# Patient Record
Sex: Male | Born: 1960 | Race: White | Hispanic: No | Marital: Married | Smoking: Former smoker
Health system: Southern US, Community
[De-identification: ages and names within clinical notes are randomized; demographics above are authoritative.]

## PROBLEM LIST (undated history)

## (undated) DIAGNOSIS — F419 Anxiety disorder, unspecified: Secondary | ICD-10-CM

## (undated) DIAGNOSIS — R635 Abnormal weight gain: Secondary | ICD-10-CM

## (undated) DIAGNOSIS — J449 Chronic obstructive pulmonary disease, unspecified: Secondary | ICD-10-CM

## (undated) DIAGNOSIS — E785 Hyperlipidemia, unspecified: Secondary | ICD-10-CM

## (undated) DIAGNOSIS — F32A Depression, unspecified: Secondary | ICD-10-CM

## (undated) DIAGNOSIS — F329 Major depressive disorder, single episode, unspecified: Secondary | ICD-10-CM

## (undated) HISTORY — DX: Anxiety disorder, unspecified: F41.9

## (undated) HISTORY — DX: Major depressive disorder, single episode, unspecified: F32.9

## (undated) HISTORY — DX: Chronic obstructive pulmonary disease, unspecified: J44.9

## (undated) HISTORY — DX: Abnormal weight gain: R63.5

## (undated) HISTORY — DX: Depression, unspecified: F32.A

## (undated) HISTORY — DX: Hyperlipidemia, unspecified: E78.5

## (undated) HISTORY — PX: KNEE ARTHROSCOPY: SUR90

---

## 1998-01-20 ENCOUNTER — Emergency Department (HOSPITAL_COMMUNITY): Admission: EM | Admit: 1998-01-20 | Discharge: 1998-01-20 | Payer: Self-pay | Admitting: Internal Medicine

## 2002-07-06 ENCOUNTER — Emergency Department (HOSPITAL_COMMUNITY): Admission: EM | Admit: 2002-07-06 | Discharge: 2002-07-06 | Payer: Self-pay | Admitting: Emergency Medicine

## 2002-07-06 ENCOUNTER — Encounter: Payer: Self-pay | Admitting: Emergency Medicine

## 2002-07-11 ENCOUNTER — Emergency Department (HOSPITAL_COMMUNITY): Admission: EM | Admit: 2002-07-11 | Discharge: 2002-07-11 | Payer: Self-pay | Admitting: *Deleted

## 2002-07-11 ENCOUNTER — Encounter: Payer: Self-pay | Admitting: *Deleted

## 2002-08-22 ENCOUNTER — Ambulatory Visit (HOSPITAL_COMMUNITY): Admission: RE | Admit: 2002-08-22 | Discharge: 2002-08-22 | Payer: Self-pay | Admitting: Family Medicine

## 2002-08-22 ENCOUNTER — Encounter: Payer: Self-pay | Admitting: Family Medicine

## 2003-04-04 ENCOUNTER — Encounter: Payer: Self-pay | Admitting: Family Medicine

## 2003-04-04 ENCOUNTER — Ambulatory Visit (HOSPITAL_COMMUNITY): Admission: RE | Admit: 2003-04-04 | Discharge: 2003-04-04 | Payer: Self-pay | Admitting: Family Medicine

## 2003-05-07 ENCOUNTER — Encounter (INDEPENDENT_AMBULATORY_CARE_PROVIDER_SITE_OTHER): Payer: Self-pay | Admitting: Specialist

## 2003-05-07 ENCOUNTER — Ambulatory Visit (HOSPITAL_COMMUNITY): Admission: RE | Admit: 2003-05-07 | Discharge: 2003-05-07 | Payer: Self-pay | Admitting: Otolaryngology

## 2003-05-07 ENCOUNTER — Ambulatory Visit (HOSPITAL_BASED_OUTPATIENT_CLINIC_OR_DEPARTMENT_OTHER): Admission: RE | Admit: 2003-05-07 | Discharge: 2003-05-07 | Payer: Self-pay | Admitting: Otolaryngology

## 2003-05-13 ENCOUNTER — Encounter: Admission: RE | Admit: 2003-05-13 | Discharge: 2003-05-13 | Payer: Self-pay | Admitting: Otolaryngology

## 2003-05-13 ENCOUNTER — Encounter: Payer: Self-pay | Admitting: Otolaryngology

## 2003-05-13 ENCOUNTER — Ambulatory Visit: Admission: RE | Admit: 2003-05-13 | Discharge: 2003-08-01 | Payer: Self-pay | Admitting: *Deleted

## 2003-05-20 ENCOUNTER — Encounter: Admission: RE | Admit: 2003-05-20 | Discharge: 2003-05-20 | Payer: Self-pay | Admitting: Dentistry

## 2003-05-30 ENCOUNTER — Ambulatory Visit (HOSPITAL_COMMUNITY): Admission: RE | Admit: 2003-05-30 | Discharge: 2003-05-30 | Payer: Self-pay | Admitting: *Deleted

## 2003-06-14 ENCOUNTER — Ambulatory Visit (HOSPITAL_COMMUNITY): Admission: RE | Admit: 2003-06-14 | Discharge: 2003-06-14 | Payer: Self-pay | Admitting: Oncology

## 2003-07-04 ENCOUNTER — Inpatient Hospital Stay (HOSPITAL_COMMUNITY): Admission: EM | Admit: 2003-07-04 | Discharge: 2003-07-12 | Payer: Self-pay | Admitting: Oncology

## 2003-07-14 ENCOUNTER — Inpatient Hospital Stay (HOSPITAL_COMMUNITY): Admission: EM | Admit: 2003-07-14 | Discharge: 2003-07-31 | Payer: Self-pay | Admitting: Emergency Medicine

## 2003-08-08 ENCOUNTER — Ambulatory Visit: Admission: RE | Admit: 2003-08-08 | Discharge: 2003-08-08 | Payer: Self-pay | Admitting: *Deleted

## 2003-08-30 ENCOUNTER — Ambulatory Visit: Admission: RE | Admit: 2003-08-30 | Discharge: 2003-08-30 | Payer: Self-pay | Admitting: *Deleted

## 2003-09-10 ENCOUNTER — Ambulatory Visit (HOSPITAL_COMMUNITY): Admission: RE | Admit: 2003-09-10 | Discharge: 2003-09-10 | Payer: Self-pay | Admitting: Oncology

## 2003-09-13 ENCOUNTER — Ambulatory Visit: Admission: RE | Admit: 2003-09-13 | Discharge: 2003-09-13 | Payer: Self-pay | Admitting: *Deleted

## 2003-10-21 ENCOUNTER — Ambulatory Visit: Admission: RE | Admit: 2003-10-21 | Discharge: 2003-11-05 | Payer: Self-pay | Admitting: *Deleted

## 2003-11-04 ENCOUNTER — Ambulatory Visit (HOSPITAL_COMMUNITY): Admission: RE | Admit: 2003-11-04 | Discharge: 2003-11-04 | Payer: Self-pay | Admitting: Otolaryngology

## 2003-11-04 ENCOUNTER — Encounter (INDEPENDENT_AMBULATORY_CARE_PROVIDER_SITE_OTHER): Payer: Self-pay | Admitting: Specialist

## 2003-11-04 ENCOUNTER — Ambulatory Visit (HOSPITAL_BASED_OUTPATIENT_CLINIC_OR_DEPARTMENT_OTHER): Admission: RE | Admit: 2003-11-04 | Discharge: 2003-11-04 | Payer: Self-pay | Admitting: Otolaryngology

## 2003-11-06 ENCOUNTER — Encounter: Admission: RE | Admit: 2003-11-06 | Discharge: 2003-11-06 | Payer: Self-pay | Admitting: Otolaryngology

## 2003-11-13 ENCOUNTER — Inpatient Hospital Stay (HOSPITAL_COMMUNITY): Admission: RE | Admit: 2003-11-13 | Discharge: 2003-11-17 | Payer: Self-pay | Admitting: Otolaryngology

## 2003-11-13 ENCOUNTER — Encounter (INDEPENDENT_AMBULATORY_CARE_PROVIDER_SITE_OTHER): Payer: Self-pay | Admitting: Specialist

## 2003-11-13 HISTORY — PX: LARYNGECTOMY: SUR815

## 2003-11-17 ENCOUNTER — Inpatient Hospital Stay (HOSPITAL_COMMUNITY): Admission: EM | Admit: 2003-11-17 | Discharge: 2003-11-19 | Payer: Self-pay | Admitting: *Deleted

## 2003-11-28 ENCOUNTER — Ambulatory Visit (HOSPITAL_COMMUNITY): Admission: RE | Admit: 2003-11-28 | Discharge: 2003-11-28 | Payer: Self-pay | Admitting: Oncology

## 2004-03-04 ENCOUNTER — Ambulatory Visit (HOSPITAL_COMMUNITY): Admission: RE | Admit: 2004-03-04 | Discharge: 2004-03-04 | Payer: Self-pay | Admitting: Oncology

## 2004-03-18 ENCOUNTER — Ambulatory Visit: Payer: Self-pay | Admitting: Dentistry

## 2004-03-21 ENCOUNTER — Ambulatory Visit (HOSPITAL_COMMUNITY): Admission: RE | Admit: 2004-03-21 | Discharge: 2004-03-21 | Payer: Self-pay | Admitting: Family Medicine

## 2004-03-26 ENCOUNTER — Ambulatory Visit: Payer: Self-pay | Admitting: Dentistry

## 2004-03-30 ENCOUNTER — Ambulatory Visit: Payer: Self-pay | Admitting: Dentistry

## 2004-04-07 ENCOUNTER — Ambulatory Visit: Payer: Self-pay | Admitting: Dentistry

## 2004-04-28 ENCOUNTER — Ambulatory Visit: Payer: Self-pay | Admitting: Dentistry

## 2004-06-02 ENCOUNTER — Ambulatory Visit: Payer: Self-pay | Admitting: Dentistry

## 2004-06-30 ENCOUNTER — Ambulatory Visit (HOSPITAL_COMMUNITY): Admission: RE | Admit: 2004-06-30 | Discharge: 2004-06-30 | Payer: Self-pay | Admitting: Oncology

## 2004-07-10 ENCOUNTER — Ambulatory Visit: Payer: Self-pay | Admitting: Oncology

## 2004-07-15 ENCOUNTER — Ambulatory Visit: Admission: RE | Admit: 2004-07-15 | Discharge: 2004-07-15 | Payer: Self-pay | Admitting: Oncology

## 2004-07-17 ENCOUNTER — Encounter: Admission: RE | Admit: 2004-07-17 | Discharge: 2004-07-17 | Payer: Self-pay | Admitting: Oncology

## 2004-08-21 ENCOUNTER — Encounter (INDEPENDENT_AMBULATORY_CARE_PROVIDER_SITE_OTHER): Payer: Self-pay | Admitting: *Deleted

## 2004-08-21 ENCOUNTER — Ambulatory Visit (HOSPITAL_COMMUNITY): Admission: RE | Admit: 2004-08-21 | Discharge: 2004-08-21 | Payer: Self-pay | Admitting: Otolaryngology

## 2004-08-21 ENCOUNTER — Ambulatory Visit (HOSPITAL_BASED_OUTPATIENT_CLINIC_OR_DEPARTMENT_OTHER): Admission: RE | Admit: 2004-08-21 | Discharge: 2004-08-21 | Payer: Self-pay | Admitting: Otolaryngology

## 2004-09-01 ENCOUNTER — Ambulatory Visit: Payer: Self-pay | Admitting: Dentistry

## 2004-09-01 ENCOUNTER — Encounter: Admission: EM | Admit: 2004-09-01 | Discharge: 2004-09-01 | Payer: Self-pay | Admitting: Dentistry

## 2004-10-14 ENCOUNTER — Ambulatory Visit: Payer: Self-pay | Admitting: Dentistry

## 2004-11-06 ENCOUNTER — Ambulatory Visit (HOSPITAL_BASED_OUTPATIENT_CLINIC_OR_DEPARTMENT_OTHER): Admission: RE | Admit: 2004-11-06 | Discharge: 2004-11-06 | Payer: Self-pay | Admitting: Otolaryngology

## 2004-11-16 ENCOUNTER — Ambulatory Visit (HOSPITAL_COMMUNITY): Admission: RE | Admit: 2004-11-16 | Discharge: 2004-11-16 | Payer: Self-pay | Admitting: Oncology

## 2004-11-20 ENCOUNTER — Ambulatory Visit: Payer: Self-pay | Admitting: Oncology

## 2004-11-25 ENCOUNTER — Ambulatory Visit: Payer: Self-pay | Admitting: Dentistry

## 2004-12-09 ENCOUNTER — Ambulatory Visit: Payer: Self-pay | Admitting: Dentistry

## 2005-01-12 ENCOUNTER — Ambulatory Visit: Payer: Self-pay | Admitting: Dentistry

## 2005-03-16 ENCOUNTER — Ambulatory Visit: Payer: Self-pay | Admitting: Oncology

## 2005-03-17 ENCOUNTER — Ambulatory Visit (HOSPITAL_COMMUNITY): Admission: RE | Admit: 2005-03-17 | Discharge: 2005-03-17 | Payer: Self-pay | Admitting: Oncology

## 2005-05-19 ENCOUNTER — Ambulatory Visit (HOSPITAL_COMMUNITY): Admission: RE | Admit: 2005-05-19 | Discharge: 2005-05-19 | Payer: Self-pay | Admitting: Oncology

## 2005-07-27 ENCOUNTER — Ambulatory Visit: Payer: Self-pay | Admitting: Dentistry

## 2005-07-30 ENCOUNTER — Ambulatory Visit: Payer: Self-pay | Admitting: Oncology

## 2005-11-15 ENCOUNTER — Ambulatory Visit (HOSPITAL_COMMUNITY): Admission: RE | Admit: 2005-11-15 | Discharge: 2005-11-15 | Payer: Self-pay | Admitting: Oncology

## 2005-11-18 ENCOUNTER — Ambulatory Visit: Payer: Self-pay | Admitting: Oncology

## 2005-11-23 LAB — CBC WITH DIFFERENTIAL/PLATELET
EOS%: 3.3 % (ref 0.0–7.0)
MCH: 30.7 pg (ref 28.0–33.4)
MCV: 89 fL (ref 81.6–98.0)
MONO%: 9.2 % (ref 0.0–13.0)
RBC: 4.8 10*6/uL (ref 4.20–5.71)
RDW: 13.9 % (ref 11.2–14.6)

## 2005-11-23 LAB — COMPREHENSIVE METABOLIC PANEL
AST: 41 U/L — ABNORMAL HIGH (ref 0–37)
Albumin: 4.1 g/dL (ref 3.5–5.2)
Alkaline Phosphatase: 45 U/L (ref 39–117)
BUN: 7 mg/dL (ref 6–23)
Potassium: 4.4 mEq/L (ref 3.5–5.3)
Sodium: 139 mEq/L (ref 135–145)
Total Bilirubin: 0.5 mg/dL (ref 0.3–1.2)
Total Protein: 7.1 g/dL (ref 6.0–8.3)

## 2006-02-09 ENCOUNTER — Ambulatory Visit: Payer: Self-pay | Admitting: Dentistry

## 2006-05-17 ENCOUNTER — Encounter (INDEPENDENT_AMBULATORY_CARE_PROVIDER_SITE_OTHER): Payer: Self-pay | Admitting: *Deleted

## 2006-05-17 ENCOUNTER — Ambulatory Visit (HOSPITAL_BASED_OUTPATIENT_CLINIC_OR_DEPARTMENT_OTHER): Admission: RE | Admit: 2006-05-17 | Discharge: 2006-05-17 | Payer: Self-pay | Admitting: Otolaryngology

## 2006-05-24 ENCOUNTER — Ambulatory Visit: Payer: Self-pay | Admitting: Oncology

## 2006-05-26 LAB — COMPREHENSIVE METABOLIC PANEL
ALT: 45 U/L (ref 0–53)
AST: 27 U/L (ref 0–37)
Albumin: 4.1 g/dL (ref 3.5–5.2)
Calcium: 9.7 mg/dL (ref 8.4–10.5)
Chloride: 106 mEq/L (ref 96–112)
Potassium: 3.8 mEq/L (ref 3.5–5.3)
Total Protein: 6.9 g/dL (ref 6.0–8.3)

## 2006-05-26 LAB — CBC WITH DIFFERENTIAL/PLATELET
BASO%: 0.7 % (ref 0.0–2.0)
Basophils Absolute: 0 10*3/uL (ref 0.0–0.1)
EOS%: 2.8 % (ref 0.0–7.0)
HGB: 14.6 g/dL (ref 13.0–17.1)
MCH: 31.3 pg (ref 28.0–33.4)
MCHC: 34.7 g/dL (ref 32.0–35.9)
RDW: 14.3 % (ref 11.2–14.6)
lymph#: 1.3 10*3/uL (ref 0.9–3.3)

## 2006-05-30 ENCOUNTER — Ambulatory Visit (HOSPITAL_COMMUNITY): Admission: RE | Admit: 2006-05-30 | Discharge: 2006-05-30 | Payer: Self-pay | Admitting: Oncology

## 2006-08-24 ENCOUNTER — Ambulatory Visit: Payer: Self-pay | Admitting: Dentistry

## 2006-11-02 IMAGING — CT CT ABDOMEN W/ CM
1 of 4 series · 12 of 32 positions shown, 16 images · IV contrast (omnipaque)
Comparison: CT chest, abdomen, and pelvis of 03/04/04.
COMPARISON: PET-CT 03/04/2004 and 09/10/2003.

<!--  IDXRADR:ADDEND:BEGIN -->Addendum Begins<!--  IDXRADR:ADDEND:INNER_BEGIN -->This addendum is being made to add the reports for the CT chest, abdomen, and pelvis of the same date, which were linked but not included in the report previously printed.
CLINICAL DATA: History of laryngeal cancer originally diagnosed in 0994 for which the patient underwent laryngectomy, chemotherapy, and radiation therapy.  Routine follow-up CT.  He complains of a two-week history of right lower chest pain and right upper quadrant abdominal pain. 
 CT OF THE CHEST, ABDOMEN, AND PELVIS WITH CONTRAST 06/30/04:
TECHNIQUE: Multidetector helical CT through the chest, abdomen, and pelvis was performed during bolus intravenous complete administration.  Oral contrast was given.  Delayed imaging through the kidneys was performed.  
 Contrast:  150 cc of Omnipaque 300.
TECHNIQUE: 17.1 mCi F-18 FDG was injected intravenously via the right
antecubital vein .  Full-ring PET imaging was performed from the skull base
through the mid-thighs 50 minutes after injection.  CT data was obtained and
used for attenuation correction and anatomic localization only.  (This was not
acquired as a diagnostic CT examination.)

[Series 2: cap w/iv 5.0 b30f · axial · 0.84mm/px · z∈[+1222,+1862]mm · 12 of 144 slices shown, 16 images]
[im 8/144  soft-tissue]
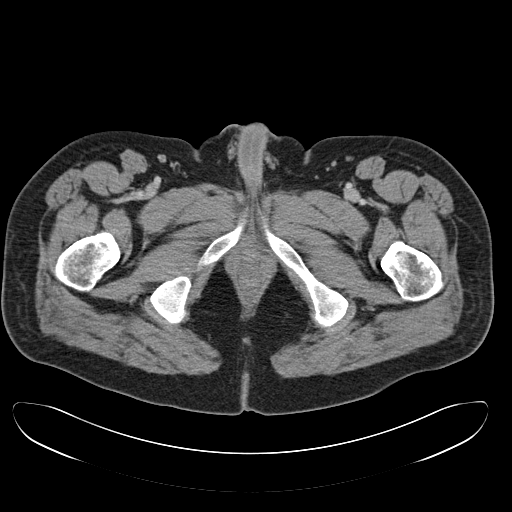
[im 8/144  bone]
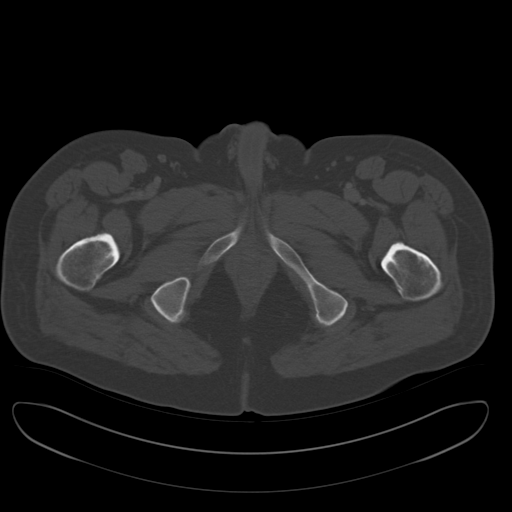
[im 23/144  soft-tissue]
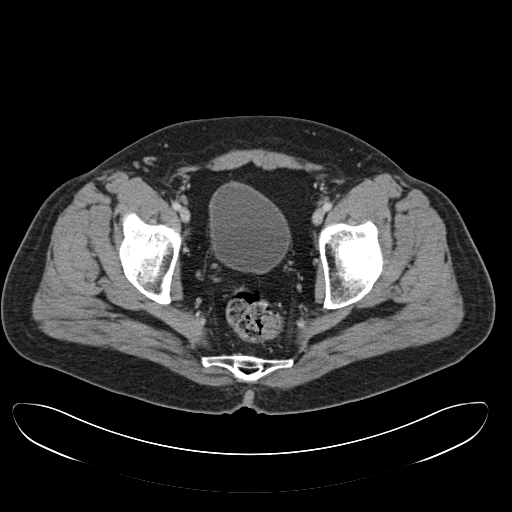
[im 38/144  soft-tissue]
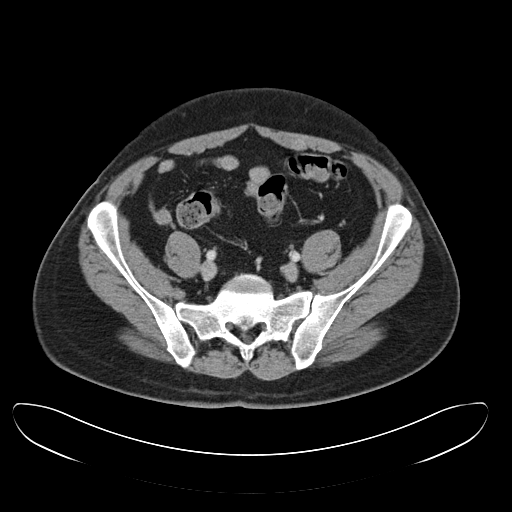
[im 53/144  soft-tissue]
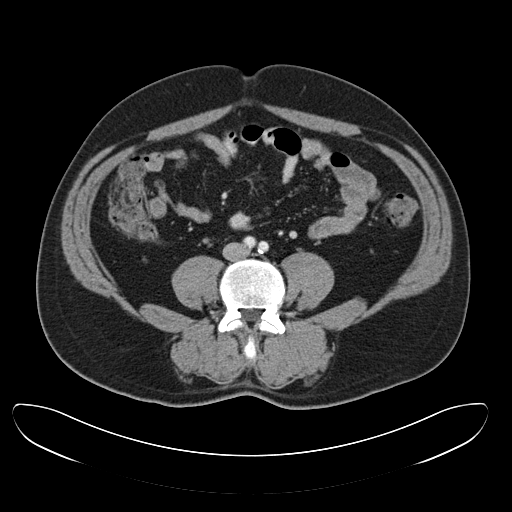
[im 68/144  soft-tissue]
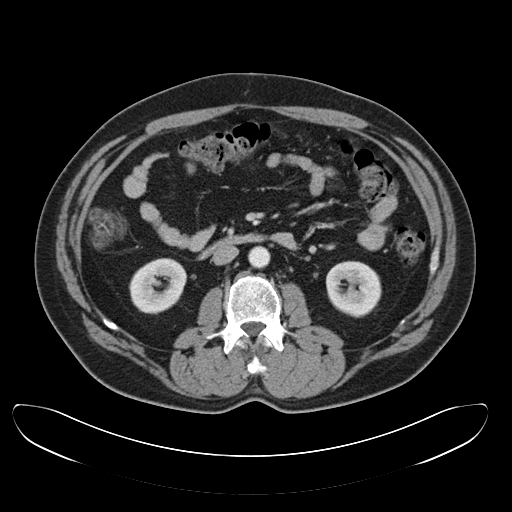
[im 76/144  soft-tissue]
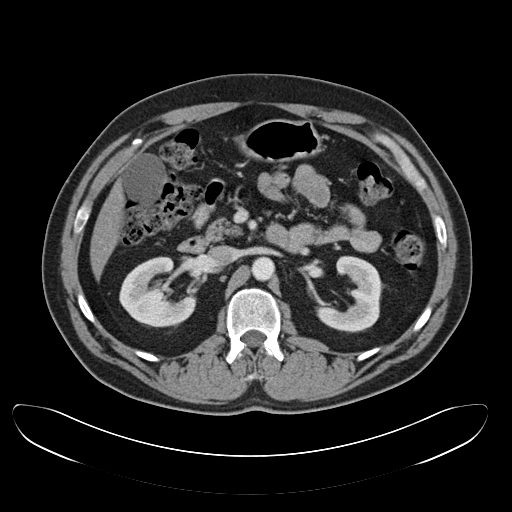
[im 91/144  soft-tissue]
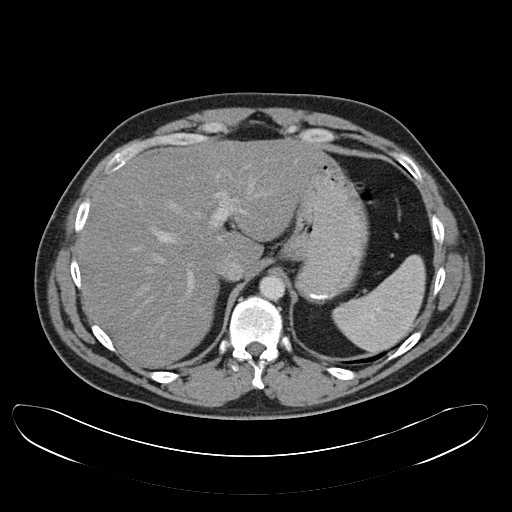
[im 106/144  soft-tissue]
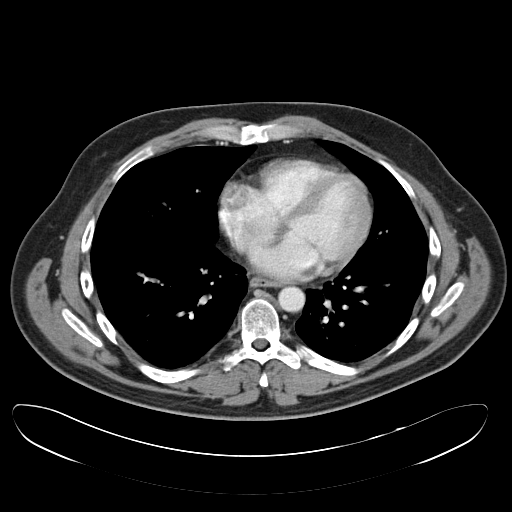
[im 113/144  lung]
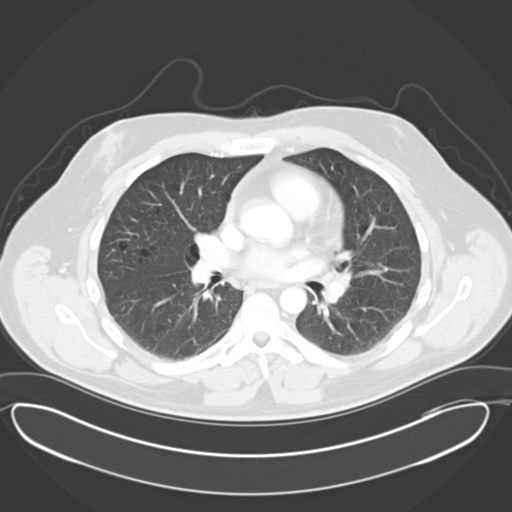
[im 121/144  soft-tissue]
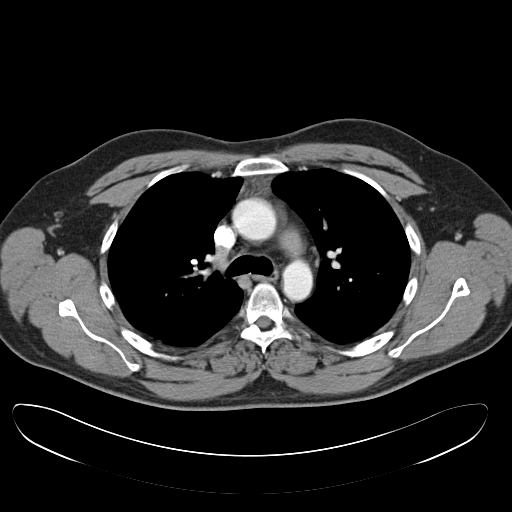
[im 121/144  lung]
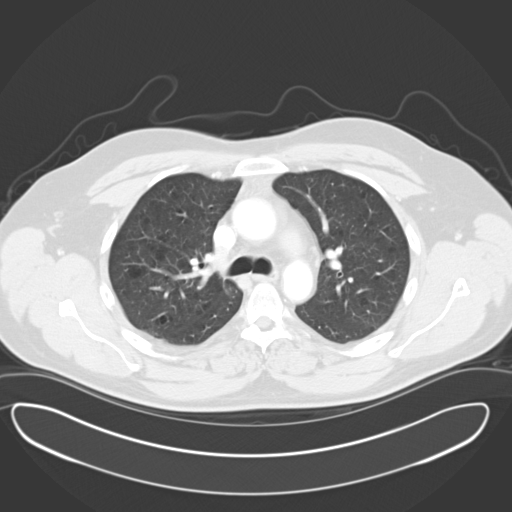
[im 121/144  bone]
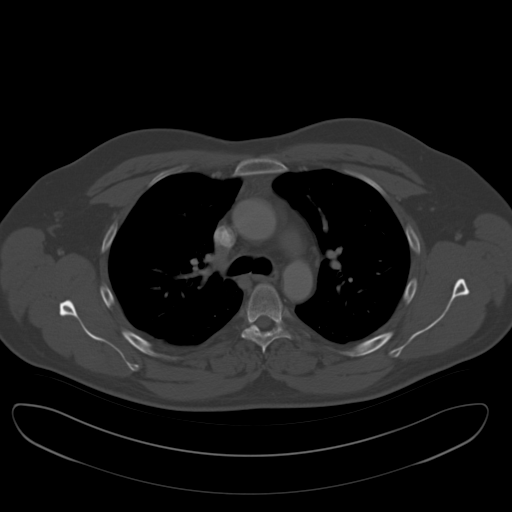
[im 128/144  lung]
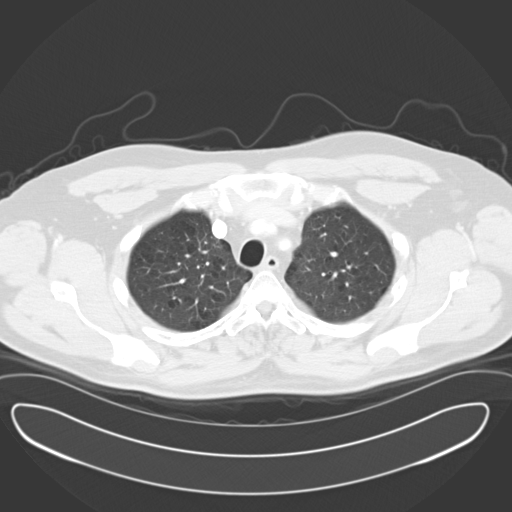
[im 136/144  soft-tissue]
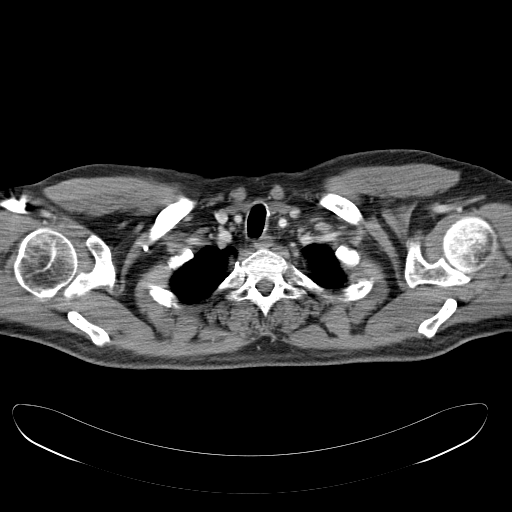
[im 136/144  lung]
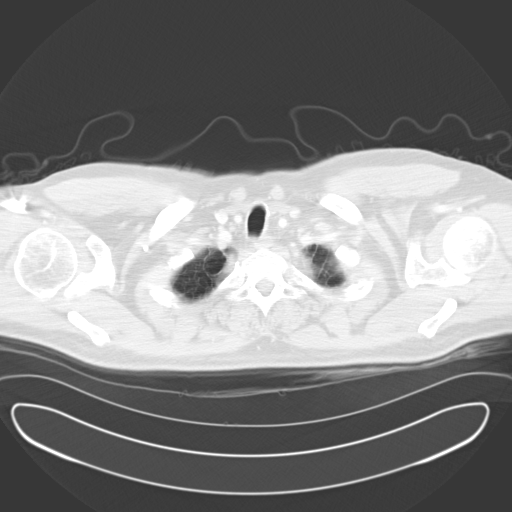

[12 of 32 positions shown; findings below may reference images not displayed]

CT CHEST:
 Postsurgical changes related to the previous tracheostomy is noted.  No significant mediastinal, hilar, or axillary lymphadenopathy is identified.  Visualized supraclavicular regions are unremarkable.  The heart size is normal and is stable.  There is no pericardial effusion.  There are no pleural effusions.  
 Emphysematous changes are again noted throughout both lungs.  The subpleural nodule adjacent to the diaphragm deep in the right lower lobe identified on multiple previous CTs is again noted and is unchanged; it is, therefore, likely a benign lesion such as a subpleural lymph node.  No new or enlarging pulmonary nodules are identified.  The lungs appear clear otherwise.
IMPRESSION: 1.  No evidence of metastatic disease in the chest. 
 2.  Emphysema.  No acute cardiopulmonary disease and no significant change since 03/04/04.
 CT ABDOMEN:
 Since the prior examination, the patient has developed mild diffuse fatty infiltration of the liver; no focal liver lesions are identified to suggest metastatic disease.  The spleen and pancreas remain normal.  The gallbladder is unremarkable by CT, and there is no biliary ductal dilation.  The previously identified left adrenal nodule is again noted and is unchanged, consistent with an adenoma.  There may be a small adenoma in the right adrenal gland at its crus, also unchanged.  Both kidneys remain normal.  Mild distal abdominal aortic atherosclerosis is present.  There is no significant lymphadenopathy in the abdomen.  There is no free fluid.  The stomach and the visualized colon and small bowel are unremarkable in the upper abdomen.  Note is made of a focus of accessory splenic tissue adjacent to the splenic hilus.
IMPRESSION: 1.  Mild diffuse fatty infiltration of the liver, a new finding since 03/04/04.  
 [DATE].  No evidence of metastatic disease in the abdomen. 
 3.  Stable small bilateral adrenal adenomas. 
 CT PELVIS:
 The colon and the small bowel are unremarkable in the pelvis.  The prostate gland is normal in size.  Seminal vesicles are normal.  The urinary bladder is normal.  There is no significant lymphadenopathy.  There is no free fluid.  Mild ileofemoral atherosclerosis is present.
IMPRESSION: 1.  No evidence of metastatic disease to the pelvis. 
 2.  No acute abnormalities in the pelvis and no significant change since 03/04/04.

 <!--  IDXRADR:ADDEND:INNER_END -->Addendum Ends
<!--  IDXRADR:ADDEND:END -->Clinical Data:  History of head and neck cancer (laryngeal) for which the
patient underwent laryngectomy. Patient also underwent radiation therapy and
chemotherapy. Routine followup. 

FDG PET-CT TUMOR IMAGING (SKULL BASE TO THIGHS)  06/30/2004:

Fasting Blood Glucose:  118
The diagnostic CT chest evident
pelvis performed subsequently today is also correlated.
FINDINGS: The postsurgical and post radiation activity identified in the neck
on the previous examination has resolved in the interval. No abnormal metabolic
activity is identified within the neck to suggest residual or recurrent
laryngeal cancer. There is asymmetric mild increased activity throughout the
left parotid gland which is nonspecific and is unchanged. The there is no
abnormal metabolic activity in the chest, abdomen or pelvis to suggest
metastatic disease.

Physiologic activity in the left ventricle, physiologic activity in luminal
contents of large and small bowel, and physiologic excretion into the urinary
tract is noted. There is an artifactual focus of increased activity which
overlies the left femoral neck on the MIP image, but is shown on the axial
images to be outside the body.
IMPRESSION: 1. No evidence of recurrent or metastatic disease.

2. Artifactual focus of activity which overlies the left femoral neck on the MIP
image but is actually outside the body.

## 2007-03-21 ENCOUNTER — Ambulatory Visit: Payer: Self-pay | Admitting: Dentistry

## 2007-05-29 ENCOUNTER — Ambulatory Visit (HOSPITAL_COMMUNITY): Admission: RE | Admit: 2007-05-29 | Discharge: 2007-05-29 | Payer: Self-pay | Admitting: Oncology

## 2007-05-31 ENCOUNTER — Ambulatory Visit: Payer: Self-pay | Admitting: Oncology

## 2007-08-09 ENCOUNTER — Ambulatory Visit: Payer: Self-pay | Admitting: Oncology

## 2007-08-11 LAB — CBC WITH DIFFERENTIAL/PLATELET
Basophils Absolute: 0 10*3/uL (ref 0.0–0.1)
Eosinophils Absolute: 0.2 10*3/uL (ref 0.0–0.5)
HCT: 39.6 % (ref 38.7–49.9)
LYMPH%: 31.1 % (ref 14.0–48.0)
MCV: 89.6 fL (ref 81.6–98.0)
MONO%: 7.7 % (ref 0.0–13.0)
NEUT#: 2.9 10*3/uL (ref 1.5–6.5)
NEUT%: 56.5 % (ref 40.0–75.0)
Platelets: 223 10*3/uL (ref 145–400)
RBC: 4.42 10*6/uL (ref 4.20–5.71)

## 2007-08-11 LAB — COMPREHENSIVE METABOLIC PANEL
Alkaline Phosphatase: 37 U/L — ABNORMAL LOW (ref 39–117)
BUN: 11 mg/dL (ref 6–23)
CO2: 23 mEq/L (ref 19–32)
Creatinine, Ser: 1.19 mg/dL (ref 0.40–1.50)
Glucose, Bld: 167 mg/dL — ABNORMAL HIGH (ref 70–99)
Sodium: 140 mEq/L (ref 135–145)
Total Bilirubin: 0.4 mg/dL (ref 0.3–1.2)

## 2007-08-11 LAB — LACTATE DEHYDROGENASE: LDH: 147 U/L (ref 94–250)

## 2007-10-03 ENCOUNTER — Ambulatory Visit: Payer: Self-pay | Admitting: Dentistry

## 2008-10-08 ENCOUNTER — Ambulatory Visit: Payer: Self-pay | Admitting: Dentistry

## 2009-10-17 ENCOUNTER — Inpatient Hospital Stay (HOSPITAL_COMMUNITY): Admission: AD | Admit: 2009-10-17 | Discharge: 2009-10-19 | Payer: Self-pay | Admitting: Internal Medicine

## 2009-11-04 ENCOUNTER — Ambulatory Visit: Payer: Self-pay | Admitting: Dentistry

## 2010-01-02 ENCOUNTER — Emergency Department (HOSPITAL_COMMUNITY): Admission: EM | Admit: 2010-01-02 | Discharge: 2010-01-02 | Payer: Self-pay | Admitting: Emergency Medicine

## 2010-01-26 ENCOUNTER — Ambulatory Visit: Payer: Self-pay | Admitting: Internal Medicine

## 2010-01-26 DIAGNOSIS — R519 Headache, unspecified: Secondary | ICD-10-CM | POA: Insufficient documentation

## 2010-01-26 DIAGNOSIS — R0989 Other specified symptoms and signs involving the circulatory and respiratory systems: Secondary | ICD-10-CM

## 2010-01-26 DIAGNOSIS — R51 Headache: Secondary | ICD-10-CM | POA: Insufficient documentation

## 2010-01-26 DIAGNOSIS — R0609 Other forms of dyspnea: Secondary | ICD-10-CM | POA: Insufficient documentation

## 2010-01-26 DIAGNOSIS — C140 Malignant neoplasm of pharynx, unspecified: Secondary | ICD-10-CM | POA: Insufficient documentation

## 2010-01-26 DIAGNOSIS — G4730 Sleep apnea, unspecified: Secondary | ICD-10-CM | POA: Insufficient documentation

## 2010-01-26 DIAGNOSIS — E039 Hypothyroidism, unspecified: Secondary | ICD-10-CM | POA: Insufficient documentation

## 2010-01-26 DIAGNOSIS — E785 Hyperlipidemia, unspecified: Secondary | ICD-10-CM | POA: Insufficient documentation

## 2010-01-26 LAB — CONVERTED CEMR LAB
GFR calc non Af Amer: 75.5 mL/min (ref >60)
Pro B Natriuretic peptide (BNP): 5.6 pg/mL (ref 0.0–100.0)
Sed Rate: 48 mm/hr — ABNORMAL HIGH (ref 0–22)

## 2010-02-27 ENCOUNTER — Ambulatory Visit: Payer: Self-pay | Admitting: Internal Medicine

## 2010-04-08 ENCOUNTER — Ambulatory Visit: Payer: Self-pay | Admitting: Internal Medicine

## 2010-04-08 DIAGNOSIS — M069 Rheumatoid arthritis, unspecified: Secondary | ICD-10-CM | POA: Insufficient documentation

## 2010-07-06 ENCOUNTER — Ambulatory Visit: Payer: Self-pay | Admitting: Internal Medicine

## 2010-08-09 ENCOUNTER — Encounter: Payer: Self-pay | Admitting: Oncology

## 2010-08-09 ENCOUNTER — Encounter: Payer: Self-pay | Admitting: Family Medicine

## 2010-08-10 ENCOUNTER — Telehealth (INDEPENDENT_AMBULATORY_CARE_PROVIDER_SITE_OTHER): Payer: Self-pay | Admitting: *Deleted

## 2010-08-13 ENCOUNTER — Encounter: Payer: Self-pay | Admitting: Internal Medicine

## 2010-08-18 NOTE — Assessment & Plan Note (Signed)
Summary: Pulmonary/ f/u ov better on symbicort 160   Copy to:  Dr. Elana Alm Primary Provider/Referring Provider:  Dr. Elana Alm  CC:  4 wk followup.  Pt states that his breathing has improved alot since started symbicort.  He still has some chest congestion but states that wymbicort has helped to break this up so that he can cough it up- was dark green but now light green x 2 wks.  He was recently dxed with having RA and sees Dr Sharmon Revere.Joseph [name]  History of Present Illness: 5 yowm quit smoking 2005 @ laryngectomy no trouble breathing prior to surgery but sob ever since and progressively getting worse.  January 26, 2010 cc doe x > slow walking even at grocery store some better with use of duoneb assoc with with minimal mucoid sputum and sign wt gain since underwent tracheotomy. rec duoneb 4x daily   February 27, 2010 4 wk followup.  Breathing some improved.  He gets out of breath when goes out in the heat.  No new complaints today.using duonebs and lot of albuterol which helps alot. no purulent sputum.Work on inhaler technique:  relax and blow all the way out then take a nice smooth deep breath back in, triggering the inhaler at same time you start breathing in  Symbicort 160 .2 puffs first thing  in am and 2 puffs again in pm about 12 hours later   April 08, 2010 4 wk followup.  Pt states that his breathing has improved alot since started symbicort.  He still has some chest congestion but states that symbicort has helped to break this up so that he can cough it up- was dark green but now light green x 2 wks.  He was recently dxed with having RA and sees Dr Sharmon Revere.  Pt denies any significant sore throat, dysphagia, itching, sneezing,  nasal congestion or excess secretions,  fever, chills, sweats, unintended wt loss, pleuritic or exertional cp, hempoptysis, change in activity tolerance  orthopnea pnd or leg swelling      Current Medications (verified): 1)  Symbicort 160-4.5 Mcg/act  Aero  (Budesonide-Formoterol Fumarate) .... 2 Puffs First Thing  in Am and 2 Puffs Again in Pm About 12 Hours Later 2)  Voltaren 0.1 % Soln (Diclofenac Sodium) .... As Directed 3)  Percocet 7.5-325 Mg Tabs (Oxycodone-Acetaminophen) .Joseph [name].. 1 Every 6 Hours As Needed 4)  Trazodone Hcl 100 Mg Tabs (Trazodone Hcl) .Joseph [name].. 1 At Bedtime 5)  Kadian 20 Mg Xr24h-Cap (Morphine Sulfate) .Joseph [name].. 1 Two Times A Day 6)  Sertraline Hcl 50 Mg Tabs (Sertraline Hcl) .Joseph [name].. 1 Once Daily 7)  Maxalt 10 Mg Tabs (Rizatriptan Benzoate) .... As Directed As Needed 8)  Novolog 100 Unit/ml Soln (Insulin Aspart) .... As Directed 9)  Novolog Mix 70/30 70-30 % Susp (Insulin Aspart Prot & Aspart) .... As Directed 10)  Cyclobenzaprine Hcl 10 Mg Tabs (Cyclobenzaprine Hcl) .Joseph [name].. 1 Three Times A Day 11)  Fenofibrate 54 Mg Tabs (Fenofibrate) .Joseph [name].. 1 Once Daily 12)  Crestor 10 Mg Tabs (Rosuvastatin Calcium) .Joseph [name].. 1 Once Daily 13)  Lansoprazole 15 Mg Cpdr (Lansoprazole) .Joseph [name].. 1 Once Daily 14)  Levothroid 137 Mcg Tabs (Levothyroxine Sodium) .Joseph [name].. 1 Once Daily 15)  Amitriptyline Hcl 150 Mg Tabs (Amitriptyline Hcl) .Joseph [name].. 1 At Bedtime 16)  Seroquel 400 Mg Tabs (Quetiapine Fumarate) .Joseph [name].. 1 Once Daily 17)  Alprazolam 1 Mg Tabs (Alprazolam) .Joseph [name].. 1 Four Times A Day As Needed 18)  Ipratropium-Albuterol 0.5-2.5 (3) Mg/38ml Soln (Ipratropium-Albuterol) .Joseph [name].. 1 in Nebulizer Four Times A Day  19)  Proair Hfa 108 (90 Base) Mcg/act Aers (Albuterol Sulfate) .... 2 Puffs Every 4-6 Hours As Needed 20)  Methotrexate 2.5 Mg Tabs (Methotrexate Sodium) .... 6 Tabs Once Per Wk 21)  Folic Acid 1 Mg Tabs (Folic Acid) .Joseph [name].. 1 Once Daily 22)  Prednisone 5 Mg Tabs (Prednisone) .Joseph [name].. 1 1/2 Once Daily  Allergies (verified): No Known Drug Allergies  Past History:  Past Medical History: Diabetes Hyperlipidemia Weight gain after quit smoking in 2005 COPD/ AB  clinical dx     - HFA 75% February 28, 2010 < 90% April 08, 2010   Vital Signs:  Patient profile:   50 year old  male Weight:      248 pounds O2 Sat:      96 % on Room air Temp:     98.3 degrees F oral Pulse rate:   114 / minute BP sitting:   110 / 70  (left arm)  Vitals Entered By: Vernie Murders (April 08, 2010 1:32 PM)  O2 Flow:  Room air  Physical Exam  Additional Exam:  somber  amb obese wm talking thru a voice box  January 26, 2010 > 248 February 28, 2010 > 248 April 08, 2010  HEENT mild turbinate edema.  Oropharynx no thrush or excess pnd or cobblestoning.  No JVD or cervical adenopathy. Mild accessory muscle hypertrophy. Trachea midline, nl thryroid. Chest was hyperinflated by percussion with diminished breath sounds and moderate increased exp time without wheeze. Hoover sign positive at mid inspiration. Regular rate and rhythm without murmur gallop or rub or increase P2 or edema.  Abd: massively obese with  no hsm, nl excursion. Ext warm without cyanosis or clubbing.     Clinical Reports Reviewed:  CXR:  01/02/2010: CXR Results:  mild bibasial atx   Impression & Recommendations:  Problem # 1:  DYSPNEA (ICD-786.09)  His updated medication list for this problem includes:    Symbicort 160-4.5 Mcg/act Aero (Budesonide-formoterol fumarate) .Joseph [name]... 2 puffs first thing  in am and 2 puffs again in pm about 12 hours later    Proair Hfa 108 (90 Base) Mcg/act Aers (Albuterol sulfate) .Joseph [name]... 2 puffs every 4-6 hours as needed    Ipratropium-albuterol 0.5-2.5 (3) Mg/60ml Soln (Ipratropium-albuterol) .Joseph [name]... 1 in nebulizer four times a day  Marked improvement suggests more AB than emphysema and ok in this setting to change the other inhalers to as needed  Each maintenance medication was reviewed in detail including most importantly the difference between maintenance prns and under what circumstances the prns are to be used.  In addition, these two groups (for which the patient should keep up with refills) were distinguished from a third group :  meds that are used only short term with the intent to  complete a course of therapy and then not refill them.  The med list was then fully reconciled and reorganized to reflect this important distinction.   Orders: Est. Patient Level III (09811) Prescription Created Electronically [phone])  Medications Added to Medication List This Visit: 1)  Symbicort 160-4.5 Mcg/act Aero (Budesonide-formoterol fumarate) .... 2 puffs first thing  in am and 2 puffs again in pm about 12 hours later 2)  Methotrexate 2.5 Mg Tabs (Methotrexate sodium) .... 6 tabs once per wk 3)  Folic Acid 1 Mg Tabs (Folic acid) .Joseph [name].. 1 once daily 4)  Prednisone 5 Mg Tabs (Prednisone) .Joseph [name].. 1 1/2 once daily  Patient Instructions: 1)   Think of your medications in 3 separate categories and keep them  separate:  2)  a) The ones you take no matter what daily on a scheduled basis 3)  b) The ones you only take if needed for specific problems 4)  c)  The ones you take for a short course and stop, like antibiotics and prednisone. 5)  Symbiocort 160 = dulera 200 6)  Return to office in 3 months, sooner if needed  7)    Prescriptions: SYMBICORT 160-4.5 MCG/ACT  AERO (BUDESONIDE-FORMOTEROL FUMARATE) 2 puffs first thing  in am and 2 puffs again in pm about 12 hours later  #1 x 11   Entered and Authorized by:   Nyoka Cowden MD   Signed by:   Nyoka Cowden MD on 04/08/2010   Method used:   Electronically to        [facility] [phone]* (retail)       [address]       [place], [place]  [address]       Ph: [phone] or [phone]       Fax: [phone]   RxID:   [id number]

## 2010-08-18 NOTE — Assessment & Plan Note (Signed)
Summary: Pulmonary/ new pt eval    Visit Type:  Initial Consult Copy to:  Dr. Elana Alm Primary Provider/Referring Provider:  Dr. Elana Alm  CC:  Dyspnea.  History of Present Illness: 50 yowm quit smoking 2005 @ laryngectomy no trouble breathing prior to surgery but sob ever since and progressively getting worse.  January 26, 2010 cc doe x > slow walking even at grocery store some better with use of duoneb assoc with with minimal mucoid sputum and sign wt gain since underwent tracheotomy. Pt denies any significant sore throat, dysphagia, itching, sneezing,  nasal congestion or excess secretions,  fever, chills, sweats, unintended wt loss,classic or reproducible  pleuritic or exertional cp, hempoptysis, change in activity tolerance  orthopnea pnd or leg swelling.  Pt also denies any obvious fluctuation in symptoms with weather or environmental change or other alleviating or aggravating factors.       Current Medications (verified): 1)  Voltaren 0.1 % Soln (Diclofenac Sodium) .... As Directed 2)  Percocet 7.5-325 Mg Tabs (Oxycodone-Acetaminophen) .Marland Kitchen.. 1 Every 6 Hours As Needed 3)  Trazodone Hcl 100 Mg Tabs (Trazodone Hcl) .Marland Kitchen.. 1 At Bedtime 4)  Kadian 20 Mg Xr24h-Cap (Morphine Sulfate) .Marland Kitchen.. 1 Two Times A Day 5)  Sertraline Hcl 50 Mg Tabs (Sertraline Hcl) .Marland Kitchen.. 1 Once Daily 6)  Maxalt 10 Mg Tabs (Rizatriptan Benzoate) .... As Directed As Needed 7)  Ipratropium-Albuterol 0.5-2.5 (3) Mg/50ml Soln (Ipratropium-Albuterol) .Marland Kitchen.. 1 in Nebulizer 2 To 3 Times Per Day As Needed 8)  Proair Hfa 108 (90 Base) Mcg/act Aers (Albuterol Sulfate) .... 2 Puffs Every 4-6 Hours As Needed 9)  Novolog 100 Unit/ml Soln (Insulin Aspart) .... As Directed 10)  Novolog Mix 70/30 70-30 % Susp (Insulin Aspart Prot & Aspart) .... As Directed 11)  Cyclobenzaprine Hcl 10 Mg Tabs (Cyclobenzaprine Hcl) .Marland Kitchen.. 1 Three Times A Day 12)  Fenofibrate 54 Mg Tabs (Fenofibrate) .Marland Kitchen.. 1 Once Daily 13)  Crestor 10 Mg Tabs (Rosuvastatin Calcium)  .Marland Kitchen.. 1 Once Daily 14)  Lansoprazole 15 Mg Cpdr (Lansoprazole) .Marland Kitchen.. 1 Once Daily 15)  Levothroid 137 Mcg Tabs (Levothyroxine Sodium) .Marland Kitchen.. 1 Once Daily 16)  Amitriptyline Hcl 150 Mg Tabs (Amitriptyline Hcl) .Marland Kitchen.. 1 At Bedtime 17)  Seroquel 400 Mg Tabs (Quetiapine Fumarate) .Marland Kitchen.. 1 Once Daily 18)  Alprazolam 1 Mg Tabs (Alprazolam) .Marland Kitchen.. 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  Quit in 2005.  Smoked approx 30 yrs up to 3 ppd. No ETOH  Review of Systems       The patient complains of shortness of breath with activity, shortness of breath at rest, productive cough, chest pain, acid heartburn, indigestion, loss of appetite, weight change, abdominal pain, difficulty swallowing, sore throat, headaches, nasal congestion/difficulty breathing through nose, itching, anxiety, depression, hand/feet swelling, change in color of mucus, and fever.  The patient denies non-productive cough, coughing up blood, irregular heartbeats, tooth/dental problems, sneezing, ear ache, joint stiffness or pain, and rash.    Vital Signs:  Patient profile:   50 year old male Height:      73 inches Weight:      248.25 pounds BMI:     32.87 O2 Sat:  96 % on Room air Temp:     98.4 degrees F oral Pulse rate:   121 / minute BP sitting:   98 / 74  (left arm)  Vitals Entered By: Vernie Murders (January 26, 2010 12:00 PM)  O2 Flow:  Room air  Serial Vital Signs/Assessments:  Comments: Ambulatory Pulse Oximetry  Resting; HR__123___    02 Sat_91%RA____  Lap1 (185 feet)   HR__127___   02 Sat__92%RA___ Lap2 (185 feet)   HR_133____   02  Sat__92%RA___    Lap3 (185 feet)   HR_136____   02 Sat__93%RA___  __X_Test Completed without Difficulty ___Test Stopped due to: Carver Fila  January 26, 2010 12:34 PM    By: Carver Fila    Physical Exam  Additional Exam:  depressed amb obese wm talking thru a voice box wt 245 January 26, 2010  HEENT mild turbinate edema.  Oropharynx no thrush or excess pnd or cobblestoning.  No JVD or cervical adenopathy. Mild accessory muscle hypertrophy. Trachea midline, nl thryroid. Chest was hyperinflated by percussion with diminished breath sounds and moderate increased exp time without wheeze. Hoover sign positive at mid inspiration. Regular rate and rhythm without murmur gallop or rub or increase P2 or edema.  Abd: massively obese with  no hsm, nl excursion. Ext warm without cyanosis or clubbing.       Sodium                    141 mEq/L                   135-145   Potassium                 5.1 mEq/L                   3.5-5.1   Chloride                  105 mEq/L                   96-112   Carbon Dioxide            29 mEq/L                    19-32   Glucose              [H]  145 mg/dL                   16-10   BUN                       15 mg/dL                    9-60   Creatinine                1.1 mg/dL                   4.5-4.0   Calcium                   9.9 mg/dL                   9.8-11.9   GFR                       75.50 mL/min                >  60  Tests: (2) TSH (TSH)   FastTSH              [H]  8.75 uIU/mL                 0.35-5.50  Tests: (3) B-Type Natiuretic Peptide (BNPR)  B-Type Natriuetic Peptide                             5.6 pg/mL                   0.0-100.0  Tests: (4) Sed Rate (ESR)   Sed Rate             [H]  48 mm/hr                    0-22  CXR  Procedure date:  01/02/2010  Findings:      mild bibasial atx  Impression & Recommendations:  Problem # 1:  DYSPNEA (ICD-786.09)  His updated medication list for this problem includes:    Ipratropium-albuterol 0.5-2.5  (3) Mg/6ml Soln (Ipratropium-albuterol) .Marland Kitchen... 1 in nebulizer 2 to 3 times per day as needed    Proair Hfa 108 (90 Base) Mcg/act Aers (Albuterol sulfate) .Marland Kitchen... 2 puffs every 4-6 hours as needed  When respiratory symptoms begin   after a patient reports complete smoking cessation,  it is very hard to "blame" COPD  ie it doesn't make any more sense than hearing a  NASCAR driver wrecked his car while driving his kids to school or a Careers adviser sliced his hand off carving [place].  Once the high risk activity stops,  the symptoms should not suddenly erupt.  If so, the differential diagnosis should include  obesity/deconditioning,  LPR/Reflux, CHF, or side effect of medications.  In this case chronicity of his complaints strongly suggests obesity with deconditionig is probably the biggest issue. ASked him to consider rehab but he doesn't think his chronic pain will allow this.  Problem # 2:  HYPOTHYROIDISM (ICD-244.9)  TSH 16.1 > 8.75 on rx  His updated medication list for this problem includes:    Levothroid 137 Mcg Tabs (Levothyroxine sodium) .Marland Kitchen... 1 once daily  Orders: New Patient Level V ([address]109)  Medications Added to Medication List This Visit: 1)  Voltaren 0.1 % Soln (Diclofenac sodium) .... As directed 2)  Percocet 7.5-325 Mg Tabs (Oxycodone-acetaminophen) .Marland Kitchen.. 1 every 6 hours as needed 3)  Trazodone Hcl 100 Mg Tabs (Trazodone hcl) .Marland Kitchen.. 1 at bedtime 4)  Kadian 20 Mg Xr24h-cap (Morphine sulfate) .Marland Kitchen.. 1 two times a day 5)  Sertraline Hcl 50 Mg Tabs (Sertraline hcl) .Marland Kitchen.. 1 once daily 6)  Maxalt 10 Mg Tabs (Rizatriptan benzoate) .... As directed as needed 7)  Ipratropium-albuterol 0.5-2.5 (3) Mg/25ml Soln (Ipratropium-albuterol) .Marland Kitchen.. 1 in nebulizer 2 to 3 times per day as needed 8)  Proair Hfa 108 (90 Base) Mcg/act Aers (Albuterol sulfate) .... 2 puffs every 4-6 hours as needed 9)  Novolog 100 Unit/ml Soln (Insulin aspart) .... As directed 10)  Novolog Mix 70/30 70-30 % Susp (Insulin aspart prot &  aspart) .... As directed 11)  Cyclobenzaprine Hcl 10 Mg Tabs (Cyclobenzaprine hcl) .Marland Kitchen.. 1 three times a day 12)  Fenofibrate 54 Mg Tabs (Fenofibrate) .Marland Kitchen.. 1 once daily 13)  Crestor 10 Mg Tabs (Rosuvastatin calcium) .Marland Kitchen.. 1 once daily 14)  Lansoprazole 15 Mg Cpdr (Lansoprazole) .Marland Kitchen.. 1 once daily 15)  Levothroid 137 Mcg Tabs (Levothyroxine sodium) .Marland KitchenMarland KitchenMarland Kitchen  1 once daily 16)  Amitriptyline Hcl 150 Mg Tabs (Amitriptyline hcl) .Marland Kitchen.. 1 at bedtime 17)  Seroquel 400 Mg Tabs (Quetiapine fumarate) .Marland Kitchen.. 1 once daily 18)  Alprazolam 1 Mg Tabs (Alprazolam) .Marland Kitchen.. 1 four times a day as needed  Other Orders: Pulse Oximetry, Ambulatory (98119) TLB-BMP (Basic Metabolic Panel-BMET) (80048-METABOL) TLB-TSH (Thyroid Stimulating Hormone) (84443-TSH) TLB-BNP (B-Natriuretic Peptide) (83880-BNPR) TLB-Sedimentation Rate (ESR) (85652-ESR)  Patient Instructions: 1)  use duoneb up to 4 x daily 2)  bring your other inhlalers with you to next visit 3)  Please schedule a follow-up appointment in 4  weeks, sooner if needed

## 2010-08-18 NOTE — Assessment & Plan Note (Signed)
Summary: Pulmonary/ ext ov with hfa teaching 75%, try Symbicort 160   Copy to:  Dr. Elana Alm Primary Provider/Referring Provider:  Dr. Elana Alm  CC:  4 wk followup.  Breathing some improved.  He gets out of breath when goes out in the heat.  No new complaints today.Joseph [name]  History of Present Illness: 50 yowm quit smoking 2005 @ laryngectomy no trouble breathing prior to surgery but sob ever since and progressively getting worse.  January 26, 2010 cc doe x > slow walking even at grocery store some better with use of duoneb assoc with with minimal mucoid sputum and sign wt gain since underwent tracheotomy. rec duoneb 4x daily   February 27, 2010 4 wk followup.  Breathing some improved.  He gets out of breath when goes out in the heat.  No new complaints today.using duonebs and lot of albuterol which helps alot. no purulent sputum. Pt denies any significant sore throat, dysphagia, itching, sneezing,  nasal congestion or excess secretions,  fever, chills, sweats, unintended wt loss, pleuritic or exertional cp, hempoptysis, change in activity tolerance  orthopnea pnd or leg swelling   Current Medications (verified): 1)  Voltaren 0.1 % Soln (Diclofenac Sodium) .... As Directed 2)  Percocet 7.5-325 Mg Tabs (Oxycodone-Acetaminophen) .Joseph [name].. 1 Every 6 Hours As Needed 3)  Trazodone Hcl 100 Mg Tabs (Trazodone Hcl) .Joseph [name].. 1 At Bedtime 4)  Kadian 20 Mg Xr24h-Cap (Morphine Sulfate) .Joseph [name].. 1 Two Times A Day 5)  Sertraline Hcl 50 Mg Tabs (Sertraline Hcl) .Joseph [name].. 1 Once Daily 6)  Maxalt 10 Mg Tabs (Rizatriptan Benzoate) .... As Directed As Needed 7)  Ipratropium-Albuterol 0.5-2.5 (3) Mg/58ml Soln (Ipratropium-Albuterol) .Joseph [name].. 1 in Nebulizer Four Times A Day 8)  Proair Hfa 108 (90 Base) Mcg/act Aers (Albuterol Sulfate) .... 2 Puffs Every 4-6 Hours As Needed 9)  Novolog 100 Unit/ml Soln (Insulin Aspart) .... As Directed 10)  Novolog Mix 70/30 70-30 % Susp (Insulin Aspart Prot & Aspart) .... As Directed 11)  Cyclobenzaprine Hcl 10 Mg  Tabs (Cyclobenzaprine Hcl) .Joseph [name].. 1 Three Times A Day 12)  Fenofibrate 54 Mg Tabs (Fenofibrate) .Joseph [name].. 1 Once Daily 13)  Crestor 10 Mg Tabs (Rosuvastatin Calcium) .Joseph [name].. 1 Once Daily 14)  Lansoprazole 15 Mg Cpdr (Lansoprazole) .Joseph [name].. 1 Once Daily 15)  Levothroid 137 Mcg Tabs (Levothyroxine Sodium) .Joseph [name].. 1 Once Daily 16)  Amitriptyline Hcl 150 Mg Tabs (Amitriptyline Hcl) .Joseph [name].. 1 At Bedtime 17)  Seroquel 400 Mg Tabs (Quetiapine Fumarate) .Joseph [name].. 1 Once Daily 18)  Alprazolam 1 Mg Tabs (Alprazolam) .Joseph [name].. 1 Four Times A Day As Needed  Allergies (verified): No Known Drug Allergies  Past History:  Past Medical History: Diabetes Hyperlipidemia Weight gain after quit smoking in 2005 COPD/ AB  clinical dx     - HFA 75% February 28, 2010   Vital Signs:  Patient profile:   50 year old male Weight:      248 pounds O2 Sat:      95 % on Room air Temp:     97.9 degrees F oral Pulse rate:   92 / minute BP sitting:   118 / 80  (left arm)  Vitals Entered By: Vernie Murders (February 27, 2010 12:20 PM)  O2 Flow:  Room air  Physical Exam  Additional Exam:  somber  amb obese wm talking thru a voice box  January 26, 2010 > 248 February 28, 2010  HEENT mild turbinate edema.  Oropharynx no thrush or excess pnd or cobblestoning.  No JVD or cervical adenopathy. Mild accessory  muscle hypertrophy. Trachea midline, nl thryroid. Chest was hyperinflated by percussion with diminished breath sounds and moderate increased exp time without wheeze. Hoover sign positive at mid inspiration. Regular rate and rhythm without murmur gallop or rub or increase P2 or edema.  Abd: massively obese with  no hsm, nl excursion. Ext warm without cyanosis or clubbing.     Impression & Recommendations:  Problem # 1:  DYSPNEA (ICD-786.09)  His updated medication list for this problem includes:    Symbicort 160-4.5 Mcg/act Aero (Budesonide-formoterol fumarate) .Joseph [name]... 2 puffs first thing  in am and 2 puffs again in pm about 12 hours later     Ipratropium-albuterol 0.5-2.5 (3) Mg/4ml Soln (Ipratropium-albuterol) .Joseph [name]... 1 in nebulizer four times a day    Proair Hfa 108 (90 Base) Mcg/act Aers (Albuterol sulfate) .Joseph [name]... 2 puffs every 4-6 hours as needed  Not able to do spirometry but freq need for saba suggests unaddressed ab component so try symbicort I spent extra time with the patient today explaining optimal mdi  technique.  This improved from  25-75% effective thru trach stoma  Orders: Est. Patient Level IV (61950)  Problem # 2:  HYPOTHYROIDISM (ICD-244.9)  His updated medication list for this problem includes:    Levothroid 137 Mcg Tabs (Levothyroxine sodium) .Joseph [name]... 1 once daily  Labs Reviewed: TSH: 8.75 (01/26/2010)   (before increase) fu Dr Elana Alm  Medications Added to Medication List This Visit: 1)  Symbicort 160-4.5 Mcg/act Aero (Budesonide-formoterol fumarate) .... 2 puffs first thing  in am and 2 puffs again in pm about 12 hours later 2)  Ipratropium-albuterol 0.5-2.5 (3) Mg/57ml Soln (Ipratropium-albuterol) .Joseph [name].. 1 in nebulizer four times a day  Patient Instructions: 1)  Work on inhaler technique:  relax and blow all the way out then take a nice smooth deep breath back in, triggering the inhaler at same time you start breathing in  2)  Symbicort 160 .2 puffs first thing  in am and 2 puffs again in pm about 12 hours later  3)  Please schedule a follow-up appointment in 4 weeks, sooner if needed

## 2010-08-20 NOTE — Progress Notes (Signed)
Summary: letter for disability hearing  Phone Note Call from Patient Call back at Home Phone [phone]   Caller: spouse/lori Call For: dr wert Summary of Call: Patients wife Lawson Fiscal phoned regarding a letter that Mr. Hoying needs from Dr. Sherene Sires for a disability hearing. She can be reached at [phone] Initial call taken by: Vedia Coffer,  August 10, 2010 4:03 PM  Follow-up for Phone Call        Pt wife is calling stating the pt has a disability hearing on Feb 14th and he needs a letter from Dr. Sherene Sires stating that he is treating him for COPD and that his COPD keeps him from working. Please advise. Pt will pick up letter once done.Carron Curie CMA  August 10, 2010 5:04 PM  Done Follow-up by: Nyoka Cowden MD,  August 13, 2010 1:58 PM  Additional Follow-up for Phone Call Additional follow up Details #1::        Letter was placed up front for pick up.  Vernie Murders  August 13, 2010 2:05 PM     Additional Follow-up for Phone Call Additional follow up Details #2::    Letter was placed up front for pick up.  LMOM for pt's spouse to be made aware. Vernie Murders  August 13, 2010 2:09 PM

## 2010-08-20 NOTE — Assessment & Plan Note (Signed)
Summary: Pulmonary/ f/u ov no change in rx   Copy to:  Dr. Elana Alm Primary Provider/Referring Provider:  Dr. Elana Alm  CC:  Dyspnea- worse.  History of Present Illness: 38 yowm quit smoking 2005 @ laryngectomy no trouble breathing prior to surgery but sob ever since and progressively getting worse.  January 26, 2010 cc doe x > slow walking even at grocery store some better with use of duoneb assoc with with minimal mucoid sputum and sign wt gain since underwent tracheotomy. rec duoneb 4x daily   February 27, 2010 4 wk followup.  Breathing some improved.  He gets out of breath when goes out in the heat.  No new complaints today.using duonebs and lot of albuterol which helps alot. no purulent sputum.Work on inhaler technique:  relax and blow all the way out then take a nice smooth deep breath back in, triggering the inhaler at same time you start breathing in  Symbicort 160 .2 puffs first thing  in am and 2 puffs again in pm about 12 hours later   April 08, 2010 4 wk followup.  Pt states that his breathing has improved alot since started symbicort.  He still has some chest congestion but states that symbicort has helped to break this up so that he can cough it up- was dark green but now light green x 2 wks.  He was recently dxed with having RA and sees Dr Sharmon Revere in [place] - rec symbicort or dulera preferes symbicort.   July 06, 2010 ov cc sob worse since cough with green sputum, improving on rx.  Pt denies any significant sore throat, dysphagia, itching, sneezing,  nasal congestion or excess secretions,  fever, chills, sweats, unintended wt loss, pleuritic or exertional cp, hempoptysis,    orthopnea pnd or leg swelling. Pt also denies any obvious fluctuation in symptoms with weather or environmental change or other alleviating or aggravating factors.       Current Medications (verified): 1)  Symbicort 160-4.5 Mcg/act  Aero (Budesonide-Formoterol Fumarate) .... 2 Puffs First Thing  in Am and 2  Puffs Again in Pm About 12 Hours Later 2)  Trazodone Hcl 100 Mg Tabs (Trazodone Hcl) .Marland Kitchen.. 1 At Bedtime 3)  Kadian 20 Mg Xr24h-Cap (Morphine Sulfate) .Marland Kitchen.. 1 Two Times A Day 4)  Sertraline Hcl 50 Mg Tabs (Sertraline Hcl) .Marland Kitchen.. 1 Once Daily 5)  Novolog 100 Unit/ml Soln (Insulin Aspart) .... As Directed 6)  Novolog Mix 70/30 70-30 % Susp (Insulin Aspart Prot & Aspart) .... As Directed 7)  Cyclobenzaprine Hcl 10 Mg Tabs (Cyclobenzaprine Hcl) .Marland Kitchen.. 1 Three Times A Day 8)  Fenofibrate 54 Mg Tabs (Fenofibrate) .Marland Kitchen.. 1 Once Daily 9)  Crestor 10 Mg Tabs (Rosuvastatin Calcium) .Marland Kitchen.. 1 Once Daily 10)  Lansoprazole 15 Mg Cpdr (Lansoprazole) .Marland Kitchen.. 1 Once Daily 11)  Levothroid 137 Mcg Tabs (Levothyroxine Sodium) .Marland Kitchen.. 1 Once Daily 12)  Amitriptyline Hcl 150 Mg Tabs (Amitriptyline Hcl) .Marland Kitchen.. 1 At Bedtime 13)  Seroquel 400 Mg Tabs (Quetiapine Fumarate) .Marland Kitchen.. 1 Once Daily 14)  Percocet 7.5-325 Mg Tabs (Oxycodone-Acetaminophen) .Marland Kitchen.. 1 Every 6 Hours As Needed 15)  Maxalt 10 Mg Tabs (Rizatriptan Benzoate) .... As Directed As Needed 16)  Alprazolam 1 Mg Tabs (Alprazolam) .Marland Kitchen.. 1 Four Times A Day As Needed 17)  Methotrexate 2.5 Mg Tabs (Methotrexate Sodium) .... 6 Tabs Once Per Wk 18)  Folic Acid 1 Mg Tabs (Folic Acid) .Marland Kitchen.. 1 Once Daily 19)  Prednisone 5 Mg Tabs (Prednisone) .Marland Kitchen.. 1 1/2 Once Daily 20)  Voltaren  0.1 % Soln (Diclofenac Sodium) .... As Directed 21)  Proair Hfa 108 (90 Base) Mcg/act Aers (Albuterol Sulfate) .... 2 Puffs Every 4-6 Hours As Needed 22)  Ipratropium-Albuterol 0.5-2.5 (3) Mg/57ml Soln (Ipratropium-Albuterol) .Marland Kitchen.. 1 in Nebulizer Four Times A Day  Allergies (verified): No Known Drug Allergies  Past History:  Past Medical History: Diabetes Hyperlipidemia Weight gain after quit smoking in 2005 COPD/ AB  clinical dx     - HFA 75% February 28, 2010  >   90% April 08, 2010   Vital Signs:  Patient profile:   50 year old male Weight:      259 pounds O2 Sat:      96 % on Room air Temp:      98.4 degrees F oral Pulse rate:   114 / minute BP sitting:   92 / 68  (left arm)  Vitals Entered By: Vernie Murders (July 06, 2010 12:01 PM)  O2 Flow:  Room air  Physical Exam  Additional Exam:  somber  amb obese wm talking thru a voice box  January 26, 2010 > 248 February 28, 2010 > 248 April 08, 2010 > 259 July 06, 2010  HEENT mild turbinate edema.  Oropharynx no thrush or excess pnd or cobblestoning.  No JVD or cervical adenopathy. Mild accessory muscle hypertrophy. Trachea midline, nl thryroid. Chest was hyperinflated by percussion with diminished breath sounds and moderate increased exp time without wheeze. Hoover sign positive at mid inspiration. Regular rate and rhythm without murmur gallop or rub or increase P2 or edema.  Abd: massively obese with  no hsm, nl excursion. Ext warm without cyanosis or clubbing.     CXR  Procedure date:  07/06/2010  Findings:        Comparison: 01/02/2010   Findings: Linear opacities are again seen at the lung bases bilaterally suggestive of scarring.  There is a background of mild emphysema noted.  No confluent airspace opacities, edema or effusions are seen.  The heart is normal in size.  The upper abdomen and osseous structures are within normal limits.   IMPRESSION: Chronic changes as detailed above without acute cardiopulmonary disease.  Impression & Recommendations:  Problem # 1:  DYSPNEA (ICD-786.09) Marked improvement suggests more AB than emphysema and ok in this setting to change the other inhalers to as needed  Each maintenance medication was reviewed in detail including most importantly the difference between maintenance prns and under what circumstances the prns are to be used.  In addition, these two groups (for which the patient should keep up with refills) were distinguished from a third group :  meds that are used only short term with the intent to complete a course of therapy and then not refill them.  The med list  was then fully reconciled and reorganized to reflect this important distinction.   Medications Added to Medication List This Visit: 1)  Prednisone 10 Mg Tabs (Prednisone) .... 4 each am x 2days, 2x2days, 1x2days and stop  Other Orders: T-2 View CXR (71020TC) Est. Patient Level III (98119)  Patient Instructions: 1)  Continue symbicrt 160 2 puffs first thing  in am and 2 puffs again in pm about 12 hours later 2)  Prednisone 10 mg 4 each am x 2days, 2x2days, 1x2days and stop  3)  add proaire if needed and use nebulizer to back up the proaire 4)  Our business office can supply you with list of charges  Prescriptions: PREDNISONE 10 MG  TABS (PREDNISONE) 4 each am  x 2days, 2x2days, 1x2days and stop  #14 x 0   Entered and Authorized by:   Nyoka Cowden MD   Signed by:   Nyoka Cowden MD on 07/06/2010   Method used:   Electronically to        [facility] [phone]* (retail)       [address] [place]       [place], [place]  [address]       Ph: [phone] or [phone]       Fax: [phone]   RxID:   [id number]

## 2010-08-20 NOTE — Letter (Signed)
Summary: [facility]  [address]   [place], [place] [address]   Phone: [phone]  Fax: [phone]    08/13/2010  [facility] [address] [place], [place]  [address]  Dear Joseph [name],   I have been following you in Pulmonary Clinic since January 26 2010 and feel you have severe COPD and should not expect you to be able to do more than deskwork at home ( like at a computer or phone) based on the severity of your disease.   We have not been able to further characterize your condition because your tracheostomy does not permit [place] to get an accurate PFT study on you.    Please let me know if you need more information.       Sincerely,    Sandrea Hughs MD

## 2010-10-04 LAB — DIFFERENTIAL
Basophils Absolute: 0 K/uL (ref 0.0–0.1)
Basophils Relative: 1 % (ref 0–1)
Eosinophils Absolute: 0.3 K/uL (ref 0.0–0.7)
Eosinophils Relative: 5 % (ref 0–5)
Lymphocytes Relative: 33 % (ref 12–46)
Lymphs Abs: 1.8 10*3/uL (ref 0.7–4.0)
Monocytes Absolute: 0.6 10*3/uL (ref 0.1–1.0)
Monocytes Relative: 11 % (ref 3–12)
Neutro Abs: 2.9 10*3/uL (ref 1.7–7.7)
Neutrophils Relative %: 51 % (ref 43–77)

## 2010-10-04 LAB — POCT I-STAT, CHEM 8
BUN: 12 mg/dL (ref 6–23)
Calcium, Ion: 1.22 mmol/L (ref 1.12–1.32)
Chloride: 106 mEq/L (ref 96–112)
Creatinine, Ser: 1.2 mg/dL (ref 0.4–1.5)
Glucose, Bld: 93 mg/dL (ref 70–99)
HCT: 38 % — ABNORMAL LOW (ref 39.0–52.0)
Hemoglobin: 12.9 g/dL — ABNORMAL LOW (ref 13.0–17.0)
Potassium: 4.5 meq/L (ref 3.5–5.1)
Sodium: 142 mEq/L (ref 135–145)
TCO2: 25 mmol/L (ref 0–100)

## 2010-10-04 LAB — CBC
HCT: 36 % — ABNORMAL LOW (ref 39.0–52.0)
Hemoglobin: 12.4 g/dL — ABNORMAL LOW (ref 13.0–17.0)
MCHC: 34.4 g/dL (ref 30.0–36.0)
MCV: 91.1 fL (ref 78.0–100.0)
Platelets: 204 K/uL (ref 150–400)
RBC: 3.95 MIL/uL — ABNORMAL LOW (ref 4.22–5.81)
RDW: 13.9 % (ref 11.5–15.5)
WBC: 5.6 K/uL (ref 4.0–10.5)

## 2010-10-04 LAB — GLUCOSE, CAPILLARY: Glucose-Capillary: 112 mg/dL — ABNORMAL HIGH (ref 70–99)

## 2010-10-07 LAB — BASIC METABOLIC PANEL
BUN: 9 mg/dL (ref 6–23)
Calcium: 9.2 mg/dL (ref 8.4–10.5)
Creatinine, Ser: 0.91 mg/dL (ref 0.4–1.5)
GFR calc non Af Amer: >60 mL/min (ref >60)
Potassium: 4.4 mEq/L (ref 3.5–5.1)

## 2010-10-07 LAB — COMPREHENSIVE METABOLIC PANEL
ALT: 65 U/L — ABNORMAL HIGH (ref 0–53)
Calcium: 9.5 mg/dL (ref 8.4–10.5)
Creatinine, Ser: 0.87 mg/dL (ref 0.4–1.5)
GFR calc non Af Amer: >60 mL/min (ref >60)
Glucose, Bld: 317 mg/dL — ABNORMAL HIGH (ref 70–99)
Sodium: 132 mEq/L — ABNORMAL LOW (ref 135–145)

## 2010-10-07 LAB — CBC
HCT: 35.8 % — ABNORMAL LOW (ref 39.0–52.0)
HCT: 41.4 % (ref 39.0–52.0)
Hemoglobin: 12.3 g/dL — ABNORMAL LOW (ref 13.0–17.0)
Hemoglobin: 14.2 g/dL (ref 13.0–17.0)
MCHC: 34.2 g/dL (ref 30.0–36.0)
MCV: 91.7 fL (ref 78.0–100.0)
MCV: 91.8 fL (ref 78.0–100.0)
Platelets: 147 10*3/uL — ABNORMAL LOW (ref 150–400)
Platelets: 170 10*3/uL (ref 150–400)
RDW: 13 % (ref 11.5–15.5)
WBC: 5 10*3/uL (ref 4.0–10.5)

## 2010-10-07 LAB — VITAMIN B12: Vitamin B-12: 948 pg/mL — ABNORMAL HIGH (ref 211–911)

## 2010-10-07 LAB — URINALYSIS, MICROSCOPIC ONLY
Bilirubin Urine: NEGATIVE
Leukocytes, UA: NEGATIVE
Nitrite: NEGATIVE
Urobilinogen, UA: 0.2 mg/dL (ref 0.0–1.0)
pH: 5.5 (ref 5.0–8.0)

## 2010-10-07 LAB — GLUCOSE, CAPILLARY
Glucose-Capillary: 173 mg/dL — ABNORMAL HIGH (ref 70–99)
Glucose-Capillary: 192 mg/dL — ABNORMAL HIGH (ref 70–99)
Glucose-Capillary: 240 mg/dL — ABNORMAL HIGH (ref 70–99)
Glucose-Capillary: 312 mg/dL — ABNORMAL HIGH (ref 70–99)
Glucose-Capillary: 343 mg/dL — ABNORMAL HIGH (ref 70–99)

## 2010-10-07 LAB — LIPID PANEL
LDL Cholesterol: UNDETERMINED mg/dL (ref 0–99)
Total CHOL/HDL Ratio: 10.4 RATIO
Triglycerides: 1114 mg/dL — ABNORMAL HIGH (ref <150)

## 2010-10-07 LAB — TSH: TSH: 16.199 u[IU]/mL — ABNORMAL HIGH (ref 0.350–4.500)

## 2010-10-07 LAB — CARDIAC PANEL(CRET KIN+CKTOT+MB+TROPI)
CK, MB: 0.6 ng/mL (ref 0.3–4.0)
Relative Index: INVALID (ref 0.0–2.5)
Relative Index: INVALID (ref 0.0–2.5)
Total CK: 43 U/L (ref 7–232)
Total CK: 46 U/L (ref 7–232)
Troponin I: 0.02 ng/mL (ref 0.00–0.06)
Troponin I: 0.04 ng/mL (ref 0.00–0.06)

## 2010-10-07 LAB — IRON AND TIBC
Iron: 72 ug/dL (ref 42–135)
UIBC: 216 ug/dL

## 2010-10-07 LAB — HEMOGLOBIN A1C: Mean Plasma Glucose: 361 mg/dL

## 2010-10-07 LAB — FERRITIN: Ferritin: 437 ng/mL — ABNORMAL HIGH (ref 22–322)

## 2010-10-07 LAB — T4, FREE: Free T4: 0.93 ng/dL (ref 0.80–1.80)

## 2010-11-01 DIAGNOSIS — K08109 Complete loss of teeth, unspecified cause, unspecified class: Secondary | ICD-10-CM

## 2010-11-01 DIAGNOSIS — Z87898 Personal history of other specified conditions: Secondary | ICD-10-CM

## 2010-11-04 ENCOUNTER — Ambulatory Visit (HOSPITAL_COMMUNITY): Payer: Self-pay | Admitting: Dentistry

## 2010-12-04 NOTE — Discharge Summary (Signed)
NAME:  Joseph [name], Joseph [name] NO.:  [id number]   MEDICAL RECORD NO.:  [id number]                   PATIENT TYPE:  INP   LOCATION:  0276                                 FACILITY:  [facility]   PHYSICIAN:  Pierce Crane, M.D.                   DATE OF BIRTH:  28-Oct-1960   DATE OF ADMISSION:  07/04/2003  DATE OF DISCHARGE:                                 DISCHARGE SUMMARY   DISCHARGE DIAGNOSIS:  Esophagitis with odynophagia secondary to radiation  and chemotherapy for laryngeal cancer.   Joseph [name] is a 50 year old with a known history of laryngeal cancer,  currently in the second week of treatment.  He has been receiving cisplatin  weekly with 5-FU by infusional pump.  He had started his second week of  treatment when he developed nausea and vomiting as well as severe  esophagitis, odynophagia, and is admitted to the hospital for further  management.  On initial evaluation he was in a moderate degree of distress.  Blood pressure was 130/70, pulse was 90, respiratory rate was 20, and  temperature was 98.  He had diffuse mucositis involving the pharynx.  He had  no palpable adenopathy.  Lungs were clear, heart sounds normal.  Abdomen  exam without any masses.   COURSE IN THE HOSPITAL:  Joseph [name] was admitted to the hospital.  He was  continued on b.i.d. radiation while in the hospital.  Interventional  radiology felt that he could not get a PEG tube placed percutaneously for  various technical reasons so surgery was consulted.  PEG tube was placed on  July 10, 2003 by Dr. Gerrit Friends.  He tolerated the procedure relatively  well.  While in the hospital he was treated aggressively for pain with  Dilaudid p.r.n. as well as Duragesic patch, as well as Roxanol.  He was  given multiple other _________ regimens for treating his nausea as well as  his mucositis and odynophagia.  He did have a temperature elevation of 102  degrees Fahrenheit postoperatively on July 10, 2003.  He was pancultured  and placed on Cipro empirically.  Cultures remained negative.  He gradually  improved in the hospital and started to take tube feedings and TNA which had  been started on admission was held.  He had no other complications while in  hospital.  On July 11, 2003 white count was __________, hemoglobin was  9.9, platelet count was 75.  Creatinine was 0.8, potassium was 4.1.  He  started Aranesp because of decreased hemoglobin.  On December 24 hemoglobin  was stable, white count was 4.7, potassium was 3.8.  He was seen by  dietician and [facility] became involved and Osmolite feedings were  set up at home to be given through a Kangaroo pump.  He was instructed on  the management and care of the PEG tube.  He had no other complications  while  in hospital.  He was discharged on December 24 in stable condition.  Temperature was 100 and was low-grade but he was otherwise asymptomatic.  Vital signs were stable with O2 saturation 94%.  He was discharged with the  following medications:  1. __________ p.o. q.8h. p.r.n.  2. Flomax 0.4 mg a day.  3. Duragesic 100 mcg q.72h.  4. Cipro 500 mg p.o. b.i.d., seven days supply.  5. __________ b.i.d.  6. Aranesp 200 mcg to be given on December 30.  7. Reglan 10 mg p.o. q.6h.  8. Viscous Xylocaine 2% 10 mL p.o. q.4h. p.r.n.  9. Roxicodone 20 mg/mL 10-20 mg p.o. q.4h. p.r.n.  10.      Chloraseptic spray.  11.      Ativan 1 mg p.o. q.8h. p.r.n.   The patient will be seen in follow-up on July 15, 2003 prior to starting  another week of chemotherapy.  He was given instructions on tube feedings  which will run for 10 hours a day at a rate of 130 mL/hour which will supply  2000 kilocalories.  The patient was discharged in stable condition from  hospital on July 12, 2003.   CONDITION:  Stable.   PROGNOSIS:  Guarded.                                               Pierce Crane, M.D.    PR/MEDQ  D:  07/12/2003   T:  07/12/2003  Job:  [id number]   cc:   Elmer Sow. Dorna Bloom, M.D.  [address] - [facility]  [place]  [place] [address]  Fax: [phone]   Velora Heckler, M.D.  [phone] [place]  [place] [address]  Fax: [phone]   Gloris Manchester. Lazarus Salines, M.D.  [address] [place]  [place] [address]  Fax: [phone]   Charlynne Pander, D.D.S.  [facility]  [address]  [place]  [place] [address]  Fax: [phone]

## 2010-12-04 NOTE — Op Note (Signed)
NAME:  Joseph [name], Joseph [name] NO.:  [id number]   MEDICAL RECORD NO.:  [id number]                   PATIENT TYPE:  INP   LOCATION:  0276                                 FACILITY:  [facility]   PHYSICIAN:  Lorre Munroe., M.D.            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   OPERATION:  Gastrostomy.   SURGEON:  Zigmund Daniel, M.D.   ANESTHESIA:  General.   DESCRIPTION OF PROCEDURE:  After the patient was monitored and had laryngeal  mask tube put in and had general anesthesia and after routine preparation  and draping of the abdomen, I made a short upper midline incision just above  the umbilicus approximately 6 cm in length.  I opened the midline fascia and  then the peritoneum and examined the abdomen as best I could through the  small incision.  There was no evidence of any cancer in the peritoneum or in  the liver.  The stomach was nondistended and had a healthy appearance.  I  pulled the colon down.  I then pulled the stomach down and along the mid  portion of the greater curve, placed a 2-0 silk pursestring suture and made  a small hole in the stomach and then put in a pretested 22 French Foley  catheter which had been put in through the abdominal wall separately.  I  then inflated the balloon.  I tied the pursestring suture and then sutured  the stomach up to the abdominal wall with three sutures of 2-0 silk.  That  formed a good seal.  I sutured to the skin with another suture of 2-0 silk  and found that fluid infused easily and this allowed withdrawal of fluid  from the stomach.  The balloon did not appear to in any way obstruct the  pylorus.  Bleeding was  not a problem.  Sponge, needle, and instrument counts  were correct.  I closed the fascia with running #1 PDS suture, and I closed  the skin with intracuticular 4-0 Vicryl suture reinforced by Steri-Strips.  The patient tolerated the operation well.                                               Lorre Munroe., M.D.    WB/MEDQ  D:  07/09/2003  T:  07/09/2003  Job:  [id number]   cc:   Pierce Crane, M.D.  [address] - [facility]  [place]  [place] [address]  Fax: [phone]

## 2010-12-04 NOTE — Discharge Summary (Signed)
NAME:  [name], Joseph [name] NO.:  [id number]   MEDICAL RECORD NO.:  [id number]                   PATIENT TYPE:  INP   LOCATION:  5711                                 FACILITY:  [facility]   PHYSICIAN:  Kristine Garbe. Ezzard Standing, M.D.         DATE OF BIRTH:  04-14-1961   DATE OF ADMISSION:  11/13/2003  DATE OF DISCHARGE:  11/17/2003                                 DISCHARGE SUMMARY   DIAGNOSIS:  T3N0 laryngeal cancer.   PROCEDURE DURING HOSPITALIZATION:  Total laryngectomy on November 13, 2003.   CONSULTATIONS:  Speech therapy consultation.   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  He had initially been  treated with radiation therapy but had persistent disease.  He was taken to  the operating room at this time for total laryngectomy.  He tolerated the  surgery well postoperatively, received perioperative antibiotics, Ancef and  Cleocin.  He ran some low grade temps for the first three to four days  postoperatively.  He was started on gastrostomy tube feedings the second  postoperative day.  Speech therapy saw the patient postoperatively to assist  in speech therapy with electrolarynx.  His postoperative course went well.  He was subsequently discharged home on his fourth postoperative day, Nov 17, 2003.  His JP drain had been removed on his third postoperative day.  The  wound was doing well, stoma was also doing well.   DISPOSITION:  Patient is discharged home on Lortab elixir 15 to 30 cc q.4h.  PRN pain.  He is discharged home with a suction apparatus.  He is instructed  on gastrostomy tube feedings which he had performed prior to surgery.   FOLLOW UP:  Patient will have follow up in my office in one week for recheck  to have staples removed from the neck.      Kristine Garbe. Ezzard Standing, M.D.    CEN/MEDQ  D:  01/21/2004  T:  01/21/2004  Job:  [id number]

## 2010-12-04 NOTE — Op Note (Signed)
NAME:  Joseph, [name] NO.:  [id number]   MEDICAL RECORD NO.:  [id number]                   PATIENT TYPE:  AMB   LOCATION:  DSC                                  FACILITY:  [facility]   PHYSICIAN:  Christopher E. Ezzard Standing, M.D.         DATE OF BIRTH:  [birth year]   DATE OF PROCEDURE:  05/07/2003  DATE OF DISCHARGE:                                 OPERATIVE REPORT   PREOPERATIVE DIAGNOSIS:  Chronic sore throat with epiglottic lesion.   POSTOPERATIVE DIAGNOSIS:  Chronic sore throat with epiglottic lesion.  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   OPERATION PERFORMED:  Direct laryngoscopy and biopsy, cervical  esophagoscopy.   SURGEON:  Kristine Garbe. Ezzard Standing, M.D.   ANESTHESIA:  General endotracheal.   COMPLICATIONS:  None.   INDICATIONS FOR PROCEDURE:  Joseph [name] is a 50 year old gentleman who  has had throat complaints since last December, sometimes worse than other  times.  He has been treated with a couple of rounds of antibiotics, has  continued to have sore throat, denies any significant hoarseness, does have  a significant smoking history, two packs per day.  More recently, the sore  throat has gotten worse.  He has had an episode of hemoptysis.  He is taken  to the operating room at this time for direct laryngoscopy and biopsy.   DESCRIPTION OF PROCEDURE:  After adequate endotracheal anesthesia, direct  laryngoscopy was performed.  The tonsil fossae appeared benign.  The base of  tongue, vallecula, epiglottis on the oral side of the epiglottis appeared  normal.  Next, the vocal cords and laryngeal surfaces of the epiglottis was  visualized.  There was an ulcerative lesion starting approximately 2 cm from  the tip of the epiglottis and extended down to the false cords a little bit  more on the left side than the right.  The true cords really were not  involved, although there was some  slight dysplasia of the right true vocal  cord.  The lesion was predominantly on the left side and extended down onto  the false vocal cords.  Photos were obtained and then several biopsies were  obtained from the epiglottic lesion and a separate biopsy was obtained from  the right true vocal cord.  Following this, cervical esophagoscopy was  performed.  Both piriform sinuses were clear.  The upper cervical esophagus  had normal mucosal coverage with no mucosal lesions.  This completed the  procedure.  Joseph [name] was awakened from anesthesia and transferred to the  recovery room postoperatively doing well.    DISPOSITION:  Joseph [name] was discharged home.  Will continue his antibiotic  Omnicef as well as mouth rinse with [facility]'s magic mouthwash.  Suggest using  the throat lozenges for sore throat and also gave prescription for Vicodin 1  to 2 every four hours as needed for pain.  Will have him follow up in my  office in three days to review pathology report.                                                Kristine Garbe. Ezzard Standing, M.D.    CEN/MEDQ  D:  05/07/2003  T:  05/07/2003  Job:  [id number]   cc:   Gaynelle Cage, MD  [phone] [address]  [place]  [place] [address]  Fax: [phone]

## 2010-12-04 NOTE — H&P (Signed)
NAME:  Joseph [name] NO.:  [id number]   MEDICAL RECORD NO.:  [id number]                   PATIENT TYPE:  EMS   LOCATION:  ED                                   FACILITY:  [facility]   PHYSICIAN:  Samul Dada, M.D.            DATE OF BIRTH:  24-Aug-1960   DATE OF ADMISSION:  07/14/2003  DATE OF DISCHARGE:                                HISTORY & PHYSICAL   HISTORY:  Joseph [name] is a 51 year old white male who comes to the  emergency room today with recurrence of symptoms that resulted in his  admission to the hospital from July 04, 2003, through July 12, 2003.  The patient is currently under treatment for a squamous cell carcinoma of  the larynx with no obvious lymph node involvement clinically or by PET scan.  The patient had an admission for esophagitis and odynophagia secondary to  treatment.  Details of his admission can be found in the dictated discharge  summary by Dr. Donnie Coffin.  As stated, the patient went home on July 12, 2003.  He did well for approximately one day.  Yesterday he started having  nausea and vomiting.  This persisted the fact that his tube feedings were  discontinued.  He has been having a recurrence of severe odynophagia and  pain even when he does not swallow.  He clearly has no appetite.  He has not  had any bowel movement for the last week or so.  He did have some fever  while in the hospital with a temperature up to 102.5 degrees several days  ago.  The patient has been afebrile on Cipro.  He is able to take in sips of  water.  The patient was taking Phenergan suppositories without benefit.  He  has also been on Roxicodone.  The patient's symptoms were not relieved by  treatment and were getting worse.  Therefore, he was brought to the  emergency room for evaluation and will be admitted for control of his  symptoms, which we feel are due to his radiation and chemotherapy  treatments.  I should mention that the  patient started his treatments about  four weeks ago.  Apparently he was receiving Cisplatin on a weekly basis and  also 5FU by continuous infusion for five days a week.  He has another two  weeks of treatments to go.  I believe his radiation treatments are given  twice a day.   PAST MEDICAL HISTORY:  Unremarkable, except for arthroscopic surgery on his  right knee in the early 1980s.  The patient denies any other surgery or  medical problems.   ALLERGIES:  No allergies to any medicines.   MEDICATIONS:  Discharge medications were:  1. Flomax 0.4 mg daily.  2. Duragesic patch 100 mcg every three days.  3. Cipro 500 mg b.i.d.  4. Aranesp 200 mcg given subcutaneously every two weeks for anemia.  5. Reglan  10 mg p.o. q.6h.  6. Viscous lidocaine 2% 10 ml every four hours as needed.  7. Roxicodone 20 mg/ml with 10-20 mg given every four hours as needed.  8. Chloraseptic spray.  9. Ativan.  10.      Gelclair packet every eight hours as needed for his mucositis.   FAMILY HISTORY:  His grandfather had colon cancer.   SOCIAL HISTORY:  The patient had smoked up to two to two-and-a-half packs of  cigarettes a day for 30 years.  He stopped in October of 2004 at the time of  his diagnosis of laryngeal cancer.  The patient has been a heavy beer  drinker for 18 years from age 43 through age 54.  He denies any use of  alcohol over the past six years.  [place]  He is here today with his [name].  He is a heavy Patent attorney.  He stopped working in October of 2004.  He last drove a car two-  and-a-half weeks ago.  His only hospitalization was just early this month.   REVIEW OF SYSTEMS:  The patient denies any loss of consciousness and any  neurologic problems.  Hearing and vision are good.  Appetite has been poor  and in fact he has lost about 40 pounds over the past several months from a  baseline weight of 200 pounds and a height of 6 pounds  2 inches.  No heart  or lung problems.  The patient has not had a bowel movement in one week.  He  has some GI symptoms as noted above.  He had some problems urinating during  his last admission and required Foley catheter for a few days.  He is on  Flomax.  He has had some intermittent swelling of his right leg.  No history  of blood clots or intermittent claudication.  He has some low back pain felt  to be due to arthritis.  He did have some fever in the hospital.  He is  currently on Cipro.  He has had some night sweats.  No bleeding or bruising  problems.  No skin problems.  No psychological problems.   PHYSICAL EXAMINATION:  GENERAL APPEARANCE:  This gentleman is quite  uncomfort and in pain.  He looks chronically and acutely ill.  VITAL SIGNS:  Blood pressure 103/77, pulse 128 and regular, respirations 28  and unlabored, O2 saturation on room air 96%, temperature 98.5 degrees.  HEENT:  The patient has a beard and mustache.  There was intense erythema of  the anterior neck region.  No scleral icterus.  Pupillary and extraocular  movements are normal.  Mouth and Pharynx:  The patient is edentulous.  He  underwent whole mouth extraction by Dr. Manson Passey and Dr. Kristin Bruins within the  past few months prior to his radiation treatments.  The tongue is slightly  coated.  No obvious thrush.  No mucositis or ulcers.  NECK:  Without adenopathy, obvious thyroid enlargement, or bruit.  HEART:  Normal.  LUNGS:  Normal.  BACK:  No skeletal tenderness or deformity.  CHEST:  The patient has a venous access device, a Port-A-Cath, in the right  anterior chest without inflammation.  ABDOMEN:  Initially tender with guarding, however, subsequently with the  patient relaxing the abdomen seems benign and somewhat musculoscaphoid with  no organomegaly or masses palpable.  No obvious ascites.  There is a  gastrostomy tube in the left mid abdomen.  Bowel sounds decreased. LYMPHATICS:  No axillary or inguinal  adenopathy.  EXTREMITIES:  No peripheral edema, clubbing, primary erythema, petechiae, or  purpura.  NEUROLOGIC:  Grossly normal.   LABORATORY DATA:  White count 6.1, hemoglobin 12.6, hematocrit 36.8,  platelets 245,000.  The ANC was 5.4.  The differential was normal.  Chemistries:  Sodium 139, potassium 4.4, glucose 127, BUN 19, creatinine  0.9, calcium 10.7, albumin 3.8.  Liver function tests were normal.  Lipase  17.  Two-view abdomen with a chest failed to disclose any bowel obstruction  or free air.  The chest x-ray apparently was unremarkable.   IMPRESSION AND PLAN:  1. Admission for nausea, vomiting, and pain.  The etiology of the patient's     nausea and vomiting is not completely clear at this time.  It may be     related to a combination of medicines, tube feedings, and his therapy.     This will be managed with IV Zofran and continuation of Reglan.  We will     hold the tube feedings.  The patient's pain appears to be related to     irritation of his mucous membranes from the combined chemotherapy and     radiation treatments.  He does not appear to have oral mucositis,     possibly a thrush and/or herpes cannot be excluded.  We will cover this     with IV Diflucan and oral Valtrex.  The patient's pain will be controlled     with narcotics as above.  2. Impaired nutrition.  We will hold G tube feedings.  We will restart the     TNA that he had been on at his last admission prior to placement to his G     tube, which I believe occurred on July 10, 2003.  3. Recent diagnosis of squamous cell carcinoma of the larynx.  His wife     thinks that he may have early stage 3 disease, although lymph nodes are     clinically negative and also PET scan negative.  4. Combined modality treatment with radiation under the direction of Dr. Dorna Bloom     and chemotherapy under the direction of Dr. Donnie Coffin.  Treatments will need     to be held at least for a few days to allow improvement of the  patient's     current symptomatology.  He has approximately two more weeks of treatment     remaining in his treatment program.                                               Samul Dada, M.D.    DSM/MEDQ  D:  07/14/2003  T:  07/14/2003  Job:  [id number]   cc:   Pierce Crane, M.D.  [address] - [facility]  [place]  [place] [address]  Fax: [phone]   Elmer Sow. Dorna Bloom, M.D.  [address] - [facility]  [place]  [place] [address]  Fax: [phone]   Juliette Alcide, M.D.  [facility]  [place], [place]

## 2010-12-04 NOTE — Consult Note (Signed)
NAME:  Joseph [name], Joseph [name] NO.:  [id number]   MEDICAL RECORD NO.:  [id number]                   PATIENT TYPE:  INP   LOCATION:  0276                                 FACILITY:  [facility]   PHYSICIAN:  Velora Heckler, M.D.                DATE OF BIRTH:  [birth year]   DATE OF CONSULTATION:  07/08/2003  DATE OF DISCHARGE:                                   CONSULTATION   REFERRING PHYSICIAN:  Pierce Crane, M.D., [facility].   REASON FOR CONSULTATION:  Need for gastrostomy tube, squamous cell carcinoma  of the larynx, malnutrition.   HISTORY OF PRESENT ILLNESS:  The patient is a 50 year old white male  admitted with squamous cell carcinoma of the larynx.  The patient was  diagnosed in September of 2004.  He is now receiving combined chemotherapy  and radiation therapy.  The patient has experienced approximately a 40-pound  weight loss during his treatment regimen.  The patient was scheduled for  radiographic placement of a feeding gastrostomy tube.  However, due to  technical problems the radiographic placement has not been possible.  Surgery is now consulted for a surgical gastrostomy tube placement in the  operating room.   PAST MEDICAL HISTORY:  History of squamous cell carcinoma of the larynx  diagnosed September 2004, status post right knee surgery.   MEDICATIONS:  See flow sheet.   ALLERGIES:  No known drug allergies.   SOCIAL HISTORY:  The patient lives in [place].  He has a fiancee.  He  quit smoking two weeks ago.   REVIEW OF SYSTEMS:  A 15-system review without significant other positive  except as noted above.   PHYSICAL EXAMINATION:  GENERAL:  A 50 year old white male, thin, well  developed in no acute distress.  VITAL SIGNS:  Temp 98.4, pulse 103, respirations 20, blood pressure 122/74.  CBG 181.  O2 saturation 95% on room air.  HEENT:  Shows to be normocephalic, atraumatic.  Sclerae are clear.  Conjunctivae are clear.  Pupils  are equal and reactive.  The patient is  edentulous due to recent dental extractions.  NECK:  Shows obvious radiation effect across the entire anterior neck.  There is mild tenderness.  There is no palpable masses.  CHEST:  Clear to auscultation bilaterally without rhonchi or rales.  CARDIAC:  Regular rate and rhythm without murmur.  Peripheral pulses are  full.  ABDOMEN:  Soft.  Bowel sounds present.  There is no surgical scars.  There  is no tenderness.  There is no sign of hernia.  EXTREMITIES:  Nontender without edema.  NEUROLOGICAL:  The patient is alert and oriented without focal deficit.   LABORATORY STUDIES:  White count 17.5, hemoglobin 12.1, platelet count  236,000.  Prothrombin time 12.9, INR 1, PTT 30.  Electrolytes are normal.  Liver function tests are normal.   IMPRESSION:  1. Squamous cell carcinoma of the larynx.  2. Malnutrition.  3. Need for feeding gastrostomy placement.   PLAN:  The patient and I discussed at length the procedure of surgical  gastrostomy placement.  We discussed the location of the surgical wounds.  We discussed the feeding tube and its use.  We discussed its ultimate  removal.  The patient understands and wishes to proceed.  We will ask  Anesthesiology to evaluate the patient preoperatively.  Orders are written.  Lebron Conners, M.D., my partner, will perform the procedure on December  21st at [facility].  The patient is in agreement.                                               Velora Heckler, M.D.    TMG/MEDQ  D:  07/08/2003  T:  07/08/2003  Job:  [id number]   cc:   Pierce Crane, M.D.  [address] - [facility]  [place]  [place] [address]  Fax: [phone]

## 2010-12-04 NOTE — Op Note (Signed)
[name], ZARCONE             ACCOUNT NO.:  [id number]   MEDICAL RECORD NO.:  [id number]          PATIENT TYPE:  AMB   LOCATION:  DSC                          FACILITY:  [facility]   PHYSICIAN:  Christopher E. Ezzard Standing, M.D.DATE OF BIRTH:  1960-12-02   DATE OF PROCEDURE:  11/06/2004  DATE OF DISCHARGE:                                 OPERATIVE REPORT   PREOPERATIVE DIAGNOSIS:  Patient is status post laryngectomy with alaryngeal  speech.   POSTOPERATIVE DIAGNOSIS:  Patient is status post laryngectomy with  alaryngeal speech.   OPERATION:  Cervical esophagoscopy with tracheoesophageal puncture and  placement of red rubber 18-gauge catheter.   SURGEON:  Kristine Garbe. Ezzard Standing, M.D.   ANESTHESIA:  General endotracheal.   COMPLICATIONS:  None.   BRIEF CLINICAL NOTE:  Joseph [name] is a 50 year old gentleman who is  status post laryngectomy approximately a year ago.  He is interested in  trying to use TE fistula for speech production, and he is taken to the  operating room at this time for TE puncture for first stage of placement of  Blom-Singer indwelling prosthesis.   DESCRIPTION OF PROCEDURE:  After adequate endotracheal anesthesia via his  tracheal stoma, the area of the puncture was marked out with a marking pen  approximately 1 cm below the upper rim of the stoma.  Next the cervical  esophagoscopy was performed.  The mucosa appeared normal in the upper  hypopharyngeal and cervical esophagus.  The scope was placed in the region  of the planned TE puncture.  The area was injected with Xylocaine with  epinephrine for hemostasis.  Then using a #11 scalpel blade, a puncture was  made at the marked site in the stoma and passed until it was visualized at  the distal end of the cervical esophagoscope.  The opening was enlarged  slightly with a hemostat and then a #18 gauge red rubber catheter was passed  without difficulty and pulled out into the mouth.  It was then passed down  the  esophagus until it went to the stomach, and the stomach contents were  aspirated.  The catheter at the puncture site was brought back about 8-10 cm  and secured to the neck in the stoma region with two 2-0 silk sutures.  This  completed the procedure.  [name] was awoken from anesthesia and transferred  t the recovery room postop doing well.  He received 1 g Ancef  intraoperatively.   DISPOSITION:  [name] is discharged home later this morning on Keflex 500 mg  b.i.d. for five days, Tylenol #3 p.r.n. pain.  He is scheduled to see speech  pathologist in 10 days for placement of Blom-Singer TE speaking valve.      CEN/MEDQ  D:  11/06/2004  T:  11/06/2004  Job:  [id number]

## 2010-12-04 NOTE — Op Note (Signed)
NAME:  Joseph [name], Joseph [name] NO.:  [id number]   MEDICAL RECORD NO.:  [id number]                   PATIENT TYPE:  OIB   LOCATION:  2108                                 FACILITY:  [facility]   PHYSICIAN:  Kristine Garbe. Ezzard Standing, M.D.         DATE OF BIRTH:  04-Apr-1961   DATE OF PROCEDURE:  11/13/2003  DATE OF DISCHARGE:                                 OPERATIVE REPORT   PREOPERATIVE DIAGNOSIS:  T3N0 epiglottic carcinoma.   POSTOPERATIVE DIAGNOSIS:  T3N0 epiglottic carcinoma.   OPERATION:  Total laryngectomy.   SURGEON:  Kristine Garbe. Ezzard Standing, M.D.   ASSISTANT:  Hermelinda Medicus, M.D.   ANESTHESIA:  General endotracheal anesthesia.   COMPLICATIONS:  None.   BRIEF CLINICAL NOTE:  Joseph [name] is a 50 year old gentleman who was  initially diagnosed with a supraglottic epiglottic carcinoma in September  2004.  He underwent primary treatment with radiation therapy and  chemotherapy, having completed this in November.  He had been having  increasing pain and on recent direct laryngoscopy and biopsy, the patient  had a fairly extensive epiglottic carcinoma.  On CT scan, this extended into  the preepiglottic space and appeared to extend down possibly to involve the  true vocal cords.  Because of the extent of the carcinoma, having undergone  radiation and chemotherapy, the patient is taken to the operating room at  this time for total laryngectomy.   DESCRIPTION OF PROCEDURE:  After adequate endotracheal anesthesia, the  patient received 1 gram Ancef IV preoperatively as well as 900 mg Ancef IV  preoperatively.  The incision on the neck was marked out, prepped with  Betadine, and draped out with sterile towels.  The collar incision was made  just below the level of the cricoid cartilage.  Subcutaneous flaps were  elevated posteriorly up to the level of the hyoid bone.  The strap muscles  were divided in the midline and were reflected laterally.  The musculature  on either side of the laryngeal was dissected off the laryngeal cartilage  preserving the strap muscles down to either lateral border of the laryngeal  cartilage.  Superiorly, suprahyoid muscles were transected.  Because the  cancer extended into the pre-epiglottic space and extended superiorly, it  was elected to enter the hypopharynx through the inferior aspect of our  incision site.  At this point, the thyroid isthmus was divided, the first  three tracheal rings were identified.  The trachea was transected between  the first and second tracheal rings.  The dissection was carried out in the  space between the trachea and the superior cervical esophagus up until we  reached the cricoid cartilage.  At this point, the hypopharynx was entered  through the right piriform sinus.  Under direct visualization of the  epiglottic cancer, the mucosal cuts were made to either side of the piriform  sinus lateral to the folds.  Dissection was carried out to the base of  tongue and  some of the distal portion of the base of the tongue was resected  along with the large epiglottic carcinoma which was confined mostly to the  laryngeal surface of the epiglottis and extended down to the false cords and  abutted the true cords.  Hemostasis was obtained with the cautery and 3-0  and 2-0 silk ligatures.  After obtaining adequate hemostasis, the defect was  closed.  The mucosal defect was closed in continuous fashion using a 2-0  chromic with atraumatic needle.  A second layer of 3-0 silk sutures were  placed in an interrupted fashion.  The tracheal stoma was created with  interrupted 2-0 silk and 2-0 Vicryl sutures.  Subcutaneous 3-0 chromic  sutures were used and the skin was reapproximated with staples.  A single JP  drain was brought out through a small incision in the left neck.  This  completed the procedure.  Nasogastric  tube was placed and placed to  suction.  The patient was subsequently awakened from  anesthesia and  transferred to the recovery room postoperatively doing well.   DISPOSITION:  The patient will be admitted to the intensive care unit  postoperatively.                                               Kristine Garbe. Ezzard Standing, M.D.    CEN/MEDQ  D:  11/13/2003  T:  11/13/2003  Job:  [id number]   cc:   Hermelinda Medicus, M.D.  [address]  [place] [address]  Fax: [phone]

## 2010-12-04 NOTE — Discharge Summary (Signed)
NAME:  Joseph [name], Joseph [name] NO.:  [id number]   MEDICAL RECORD NO.:  [id number]                   PATIENT TYPE:  INP   LOCATION:  0276                                 FACILITY:  [facility]   PHYSICIAN:  Pierce Crane, M.D.                   DATE OF BIRTH:  [birth year]   DATE OF ADMISSION:  07/14/2003  DATE OF DISCHARGE:  07/31/2003                                 DISCHARGE SUMMARY   DISCHARGE DIAGNOSES:  1. Squamous cell carcinoma of the larynx, on combined modality treatment     consisting of Cisplatin and 5FU as well as radiation therapy.  2. Nausea and vomiting.  3. Esophagitis secondary to thrush.  4. Odynophagia.  5. Diarrhea.  6. Smoker.  7. Patient has a percutaneous endoscopic gastrostomy tube.   PATIENT IDENTIFICATION:  Joseph [name] is a 50 year old black man who was  diagnosed with laryngeal cancer in October of 2004.  Since his diagnosis he  has stopped working and has been under the care of Dr. Pierce Crane as well  as Dr. Jackelyn Knife.  He was recently admitted December 16 through July 12, 2003, because of esophagitis and odynophagia secondary to treatment and a  PEG tube was placed because of difficulty swallowing.  This admission  occurred only less than 48 hours after discharge from home because of  worsening of symptoms with nausea and vomiting and pain.   HOSPITAL COURSE:  Joseph [name] was started on IV fluids as well as other  supportive care immediately on admission.  Tube feedings were held because  of the nausea and vomiting and he resumed TNA.  He was treated with pain  medications, his Duragesic was increased from 100 mcg to 200 mcg because of  the throat pain and he was given Dilaudid drip, instead of bolus, which did  relieve the pain somewhat.  His radiation therapy resumed on July 15, 2003, and tube feeding also resumed that day with weaning of the TNA on  July 16, 2003.  His temp went up to 100.2 on July 16, 2003 and  that  was his peak.  On July 18, 2003, he started having nausea and vomiting  again, tube feedings were held and IV fluids were increased.  His Dilaudid  was decreased to 2 mg an hour.  On July 19, 2003, Protonix was added and  RT was held once again because of his symptoms.  The patient requested his  pain medicines be decreased, as he was afraid he was getting too much  medication, and Duragesic was decreased back down to 100 mcg q.72 hours.  He  was using Gelclair and lidocaine for symptoms.  On July 20, 2003, his  nausea and vomiting had resolved and tube feeding was resumed.  Radiation  therapy was resumed on July 22, 2003, and on July 24 2003, the patient  saw Dr. Dorna Bloom and was found  to have some erythematous dry desquamation of the  neck and Biafine was ordered instead of the Silvadene.  He was started back  on continuous infusion of 5FU on July 25, 2003, and the remainder of the  hospitalization was fairly unremarkable.  He continued to receive supportive  care and his radiation therapy was complete on July 30, 2003, 58  scheduled treatments were completed.  Of importance, he also was seen by  [facility] and acyclovir was prescribed because of the esophagitis.  He  was discharged home on July 31, 2003.   PROCEDURES INCLUDE:  1. Radiation therapy daily.  2. 5FU continuous infusion chemo for seven days.   LABS:  CBC:  White count went as low as 2.7 on July 21, 2003.  Hemoglobin  ranged from 9.9 on July 22, 2003, up to 12.6 on July 14, 2003.  Hematocrit ranged from 29.7 on July 22, 2003, to 36.8 on July 14, 2003.  ANC was within normal limits.  Chemistries were normal except glucose  mildly elevated as high as 130 on July 17, 2003.  LFTs were normal.  Prealbumin on July 16, 2003, was 14.4.  July 14, 2003, acute  abdominal series revealed no active lung disease or no bowel obstruction.  Port-a-Cath tip in the superior vena cava.    DISCHARGE MEDS:  1. Flomax 0.4 mg daily.  2. Senokot-S two b.i.d.  3. Gelclair gel packet q.8h.  4. Ativan 1 mg q.h.s.  5. Duragesic 125 mcg q.72h.  6. Protonix 40 mg b.i.d.  7. Reglan 10 mg two q.6h.  8. Zofran 8 mg q.12h.  9. Acyclovir 400 mg q.12h.  10.      Viscous lidocaine 10 cc q.4h. p.r.n.  11.      Oxycodone 20 mg one or two q.4h. p.r.n. pain.  12.      Milk of Magnesia 30 cc b.i.d.  13.      Restoril 15 mg q.h.s.  14.      Maalox 60 cc q.4h. p.r.n.  15.      Guaifenesin 10 cc q.4h. p.r.n. cough.   CONDITION ON DISCHARGE:  Improved.   DIET:  Osmolite 1.2 at 50 cc an hour increased 10 cc q.8h., as tolerated,  for a goal of 75 cc an hour for 16 hours a day.  Flush the tube with 75 cc  of water six times a day and oral nutrition as tolerated.   ACTIVITY:  As tolerated.   FOLLOW UP:  1. Home health nurse to care for Port-a-Cath flush and feeding tube care.  2. Followup with Dr. Donnie Coffin in approximately two weeks and with Dr. Jackelyn Knife     in approximately one week.     Melony Overly, [place]                          Pierce Crane, M.D.    TH/MEDQ  D:  09/03/2003  T:  09/03/2003  Job:  [id number]   cc:   Elmer Sow. Dorna Bloom, M.D.  [address] - [facility]  [place]  [place] [address]  Fax: [phone]   Juliette Alcide, Dr.  [facility]

## 2010-12-04 NOTE — H&P (Signed)
NAME:  Joseph [name], Joseph [name] NO.:  [id number]   MEDICAL RECORD NO.:  [id number]                   PATIENT TYPE:  INP   LOCATION:  0276                                 FACILITY:  [facility]   PHYSICIAN:  Pierce Crane, M.D.                   DATE OF BIRTH:  12/19/1960   DATE OF ADMISSION:  07/04/2003  DATE OF DISCHARGE:                                HISTORY & PHYSICAL   PROBLEM:  1. Laryngeal cancer.  2. Admission for odynophagia and dehydration.   IDENTIFYING DATA:  Joseph [name] is a 50 year old gentleman from [place].   HISTORY OF PRESENT ILLNESS:  This gentleman has been in good health all his  life.  He has a previous history of sore throat and ultimately underwent  further evaluation, which showed a squamous cell carcinoma of the laryngeal  surface and the epiglottis.  Biopsy on May 07, 2003 showed an ulcerative  lesion superior to the epiglottis with a dysplastic lesion in the right true  vocal cord.  Biopsies of the laryngeal surfaces and the epiglottis revealed  a moderately differentiated squamous cell carcinoma.  Staging evaluation did  not show any involvement of the regional lymph nodes based on CAT scan.  CT  staging also was negative.  The patient was seen by Dr. Kristin Bruins and Dr.  __________ with central dental extractions.  He had been started on  treatment with chemotherapy using platinum 530 mg/meter squared weekly with  5-FU by infusion.  He had been on his second week of treatment and on the  third week of chemotherapy h experienced severe odynophagia and dysphagia.  He was unable to tolerate any fluids.  He was also having some nausea and  vomiting.  He is therefore admitted for further evaluation and supportive  care.   PAST MEDICAL HISTORY:  Past medical history is unremarkable.   PAST SURGICAL HISTORY:  Right knee surgery a number of years ago.   ALLERGIES:  None.   HABITS:  Smoking history; two to three packs a 30 day for about  30 years.  Alcohol consumption not currently, but had drunk heavily up until a few  years ago.   SOCIAL HISTORY:  The patient has a fiancee.  No children.  Works in  [employer].   REVIEW OF SYSTEMS:  Prior to being diagnosed he had lost about 40 pounds.  He had been reluctant to have a PEG tube placed.  Since he started treatment  he has lost another 10 pounds.  He has occasional blood-tinged sputum.  He  denies any chest pain, shortness of breath or cough.  Major symptoms are  related to swallowing.  Denies any numbness or tingling in the hands or  feet.   PHYSICAL EXAMINATION:  GENERAL APPEARANCE:  On physical examination today  this gentleman looks his stated age.  He has a marked degree of discomfort  even while talking.  He  has difficulty clearing secretions.  VITAL SIGNS:  Blood pressure is 120/70.  Temperature is 98.  Pulse is 90 and  respiratory rate is 20.  HEAD AND NECK EXAMINATION:  Oropharynx shows diffuse erythema.  No frank  mucositis or thrush.  No regional adenopathy,  LUNGS: Lungs are clear.  HEART:  Heart sounds are normal.  CHEST:  Port site looks normal.  ABDOMEN:  No intra-abdominal, organomegaly,  masses.  No inguinal  adenopathy.  EXTREMITIES:  Normal peripheral pulses.   LABORATORY DATA:  Laboratory findings are pending.   PLAN:  1. Joseph [name] will be admitted to the hospital for IV fluids.  2. The patient will need a PEG tube placed.  3. The patient will require aggressive pain management and local supportive     care for his oropharyngeal discomfort.  4. we will hold chemotherapy for now.  5. Radiation may continue for the time being.                                               Pierce Crane, M.D.    PR/MEDQ  D:  07/10/2003  T:  07/10/2003  Job:  [id number]   cc:   Elmer Sow. Dorna Bloom, M.D.  [address] - [facility]  [place]  [place] [address]  Fax: [phone]   Charlynne Pander, D.D.S.  [facility]  [address]   [place]  [place] [address]  Fax: [phone]   Dr.

## 2010-12-04 NOTE — Op Note (Signed)
[name], [name]             ACCOUNT NO.:  [id number]   MEDICAL RECORD NO.:  [id number]          PATIENT TYPE:  AMB   LOCATION:  DSC                          FACILITY:  [facility]   PHYSICIAN:  Christopher E. Ezzard Standing, M.D.DATE OF BIRTH:  [birth year]   DATE OF PROCEDURE:  DATE OF DISCHARGE:                                 OPERATIVE REPORT   PREOPERATIVE DIAGNOSIS:  Base of tongue lesion.   POSTOPERATIVE DIAGNOSIS:  Base of tongue lesion (frozen section consistent  with benign inflammation).   OPERATION:  Direct laryngoscopy and biopsy, esophagoscopy.   ANESTHESIA:  General endotracheal.   COMPLICATIONS:  None.   BRIEF CLINICAL NOTE:  Joseph [name] is a 50 year old gentleman who is 10  months status post laryngectomy.  He has had a persistent small lesion on  the base of the tongue, just at the entrance of the esophagus and  hypopharyngeal region.  This has been persistent for the last two or three  months.  He is taken to the operating room at this time for direct  laryngoscopy and biopsy as well as esophagoscopy because he has had some  dysphagia.   DESCRIPTION OF PROCEDURE:  After adequate endotracheal anesthesia, first  direct laryngoscopy was performed.  On evaluation of the base of the tongue,  the base of the tongue was soft and the base of the tongue appeared benign.  Just at the previous suture line between the hypopharynx and base of tongue,  there is a lesion anteriorly.  It appears to represent where silk suture was  retained.  Biopsy was obtained from this area and sent for frozen section.  Remainder of the hypopharyngeal region appeared normal, with normal-  appearing mucosa.  While waiting for the frozen section, esophagoscopy was  performed and the __________ esophagoscope was easily passed down the  esophagus.  Esophageal walls appeared benign.  This completed the procedure.  Frozen section biopsy revealed only inflammation and no evidence of  neoplasia.  The  child was awoken from anesthesia and transferred to recovery  and postoperative, doing well.   DISPOSITION:  Joseph [name] is discharged home late this morning.  He is to follow  up, in my office, in two months for recheck and consider possible TE fistula  procedure one year following laryngectomy in two to three months.      CEN/MEDQ  D:  08/21/2004  T:  08/21/2004  Job:  [id number]

## 2010-12-04 NOTE — H&P (Signed)
NAME:  Joseph [name], WHETSELL NO.:  [id number]   MEDICAL RECORD NO.:  [id number]                   PATIENT TYPE:  INP   LOCATION:  5707                                 FACILITY:  [facility]   PHYSICIAN:  Alfonse Flavors, M.D.                 DATE OF BIRTH:  03-Aug-1960   DATE OF ADMISSION:  11/17/2003  DATE OF DISCHARGE:                                HISTORY & PHYSICAL   CHIEF COMPLAINT:  Vomiting five days status post laryngectomy.   HISTORY OF PRESENT ILLNESS:  Mr. Sommerville is a patient of Kristine Garbe.  Ezzard Standing, M.D., with a history of carcinoma of the larynx.  Mr. Abril had  radiation therapy and chemotherapy in 2004.  He was noted by Dr. Ezzard Standing to  have a recurrence of his carcinoma and underwent a total laryngectomy by Dr.  Ezzard Standing on November 13, 2003.  He was discharged this morning.  Mr. Mishkin has  a gastric tube and receives tube feedings.  Tonight his wife administered  hydrocodone through his gastric tube at about 5 p.m.  He was then started on  tube feedings with a pump.  He began vomiting about 6 p.m. and has continued  to have intermittent vomiting since that time.  He also has diarrhea and was  noted by the home health nurse to have fever.   PAST SURGICAL HISTORY:  1. Gastric feeding tube.  2. Port-A-Cath.  3. Knee surgery.  4. Laryngectomy.   MEDICATIONS:  Oxycodone, amoxicillin, and Tylenol.   ALLERGIES TO MEDICINES:  None.   REVIEW OF SYSTEMS:  Mr. Casanas wife denies that he has any other current  health problems.   PHYSICAL EXAMINATION:  VITAL SIGNS:  The temperature is 101.5 degrees, the  pulse is 129, respirations 22, and oxygen saturation 99.  GENERAL APPEARANCE:  Mr. Macrae is alert.  HEENT:  The tympanic membranes are normal bilaterally.  The oropharynx is  normal.  NECK:  Examination shows radiation dermatitis.  Mr. Virts has recent  surgical incisions which are closed with staples.  There is no drainage from  his incisions  and no subcutaneous fluid is palpable.  His trachea stoma is  unremarkable.  There is a tracheostomy tube in place.  CHEST:  Clear to auscultation.  HEART:  Auscultation reveals normal S1 and S2.  ABDOMEN:  Minimal tenderness.  Normal bowel sounds are present.  There is a  gastric tube in place.  EXTREMITIES:  Minimal generalized tenderness.  There is no focal tenderness  to suggest thrombophlebitis.   IMPRESSION:  1. Five days status post laryngectomy.  2. Febrile.  3. Vomiting and refusing further tube feeds.   RECOMMENDATIONS:  1. Admission for intravenous fluids.  2. CBC with differential, urinalysis, electrolytes, and chest x-ray.  3. Antiemetics.  4. Dr. Newell Coral will be advised of Mr. Khim readmission for further     evaluation as indicated.  Alfonse Flavors, M.D.    JCM/MEDQ  D:  11/18/2003  T:  11/18/2003  Job:  [id number]

## 2010-12-04 NOTE — Op Note (Signed)
NAMENYHEIM, SEUFERT             ACCOUNT NO.:  [id number]   MEDICAL RECORD NO.:  [id number]          PATIENT TYPE:  AMB   LOCATION:  DSC                          FACILITY:  [facility]   PHYSICIAN:  Christopher E. 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   OPERATION:  Tracheostoma modification.   SURGEON:  Kristine Garbe. Ezzard Standing, M.D.   ANESTHESIA:  Local, Xylocaine with epinephrine.   BRIEF CLINICAL NOTE:  Joseph [name] is a 50 year old gentleman who is  status post laryngectomy, had a TE fistula placed and has done well with  this except more recently the excess scar tissue and granulation tissue has  partially obstructed the use of the Blom-Singer valve.  He is taken to  operating room at this time for modification of the stoma.  On examination,  he has excessive granulation tissue on the left side of the valve area and  will plan to have this excised.   DESCRIPTION OF PROCEDURE:  The area was prepped with Betadine solution and  was then injected with Xylocaine with epinephrine.  The excessive  granulation tissue on the left side of the stoma was excised and sent to  pathology.  Small defect was cauterized for hemostasis and then two  interrupted 5-0 chromic sutures were placed without difficulty.  Bacitracin  ointment was applied and a stent through his TE fistula was then was placed  as a Blom-Singer valve was left out.  He tolerated this well and was  subsequently discharged home.   DISPOSITION:  I will have Kriss follow up in my office in 2 weeks for  recheck, instructed to leave the valve out for the next of 5-7 days and then  can have it replaced by his speech  therapist.           ______________________________  Kristine Garbe. Ezzard Standing, M.D.     CEN/MEDQ  D:  05/17/2006  T:  05/17/2006  Job:  [id number]

## 2010-12-04 NOTE — Op Note (Signed)
NAME:  TRAVIAN, KERNER NO.:  [id number]   MEDICAL RECORD NO.:  [id number]                   PATIENT TYPE:  AMB   LOCATION:  DSC                                  FACILITY:  [facility]   PHYSICIAN:  Christopher E. Ezzard Standing, M.D.         DATE OF BIRTH:  04/14/1961   DATE OF PROCEDURE:  11/04/2003  DATE OF DISCHARGE:                                 OPERATIVE REPORT   PREOPERATIVE DIAGNOSIS:   POSTOPERATIVE DIAGNOSIS:   OPERATION PERFORMED:   SURGEON:   ANESTHESIA:   INDICATIONS FOR PROCEDURE:  Graiden Henes is a 50 year old gentleman who  has had a previous history of epiglottic, supraglottic squamous cell  carcinoma, underwent primary treatment with radiation therapy and  chemotherapy this past winter.  He has had increasing sore throat and on  exam in the office has a supraglottic mass arising from the laryngeal  surface of the epiglottis.  He was taken to the operating room at this time  for direct laryngoscopy and biopsy of supraglottic mass.   DESCRIPTION OF PROCEDURE:  After adequate endotracheal anesthesia, the  patient was noted to have a large supraglottic mass partially obscuring  visualization of the glottis.  Laryngoscope was suspended.  Using large cup  forceps, the mass was debulked.  There was a large amount of nonviable,  nonbleeding, ischemic tissue within the mass but there was also what  appeared to be grossly active tumor.  After debulking the mass the glottis  could be much better visualized.  Specimen was sent to pathology.  The mass  extended all the way down to the anterior commissure but really did not  grossly affect the vocal cords.  Involved both the false cords and extended  up on the laryngeal surface of the epiglottis.  It did not extend over the  AE folds.  Both piriform sinus areas were clear.  Hemostasis was obtained  with a cotton pledget soaked in Adrenalin.  The patient received 8 mg of  Decadron and a gram of Ancef  IV preoperatively.  Mekiah was subsequently  awakened from anesthesia and transferred to recovery room postoperatively  doing well.   DISPOSITION:  Jusiah will be discharged to home later this morning.  He  already has pain medicine, hydrocodone, OxyContin.  I will also place him on  amoxicillin 600 mg twice daily for a week.  Will plan on obtaining follow-up  CT scan. He will probably require surgery either total laryngectomy versus  supraglottic laryngectomy at a later date.  Pending results of pathology.                                               Kristine Garbe. Ezzard Standing, M.D.    CEN/MEDQ  D:  11/04/2003  T:  11/04/2003  Job:  [id number]   cc:  Pierce Crane, M.D.  [address] - [facility]  [place]  [place] [address]  Fax: [phone]   Elmer Sow. Dorna Bloom, M.D.  [address] - [facility]  [place]  [place] [address]  Fax: [phone]

## 2011-03-23 ENCOUNTER — Encounter: Payer: Self-pay | Admitting: Cardiology

## 2011-03-24 ENCOUNTER — Encounter: Payer: Self-pay | Admitting: Cardiology

## 2011-03-24 ENCOUNTER — Ambulatory Visit (INDEPENDENT_AMBULATORY_CARE_PROVIDER_SITE_OTHER): Payer: Medicare Other | Admitting: Cardiology

## 2011-03-24 ENCOUNTER — Other Ambulatory Visit: Payer: Self-pay | Admitting: Cardiology

## 2011-03-24 VITALS — BP 124/78 | HR 110 | Resp 18 | Ht 74.0 in | Wt 253.0 lb

## 2011-03-24 DIAGNOSIS — R9431 Abnormal electrocardiogram [ECG] [EKG]: Secondary | ICD-10-CM

## 2011-03-24 NOTE — Assessment & Plan Note (Signed)
The patient had an abnormal EKG but no recent cardiovascular symptoms suggestive of ischemia. Stress testing would be very difficult for him eat and was pharmacologic because of his chronic cough. I will plan an echocardiogram. If this is perfectly normal, in the absence of symptoms, I would not suggest further testing.

## 2011-03-24 NOTE — Patient Instructions (Addendum)
Your physician has requested that you have an echocardiogram. Echocardiography is a painless test that uses sound waves to create images of your heart. It provides your doctor with information about the size and shape of your heart and how well your heart's chambers and valves are working. This procedure takes approximately one hour. There are no restrictions for this procedure.  The current medical regimen is effective;  continue present plan and medications.  Follow up as needed

## 2011-03-24 NOTE — Progress Notes (Signed)
HPI The patient presents for evaluation of an abnormal EKG.  He has no prior cardiac history. 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 Ever since surgery to treat this he had problems with dyspnea. He relates it to a build up of mucus that happens periodically and chokes him. He has trouble clearing this and becomes anxious with this. He has not had any prior cardiac workup. He's not had any chest pressure, neck or arm discomfort. He does have sinus tachycardia but doesn't report palpitations, presyncope or syncope. He sleeps chronically on 3 pillows. He's had no recent weight gain or swelling.  No Known Allergies  Current Outpatient Prescriptions  Medication Sig Dispense Refill  . albuterol (PROVENTIL HFA;VENTOLIN HFA) 108 (90 BASE) MCG/ACT inhaler Inhale 2 puffs into the lungs every 6 (six) hours as needed.        . ALPRAZolam (XANAX) 1 MG tablet Take 1 mg by mouth 4 (four) times daily as needed.        Marland Kitchen amitriptyline (ELAVIL) 150 MG tablet Take 150 mg by mouth at bedtime.        . budesonide-formoterol (SYMBICORT) 160-4.5 MCG/ACT inhaler Inhale 2 puffs into the lungs 2 (two) times daily.        . cyclobenzaprine (FLEXERIL) 10 MG tablet Take 10 mg by mouth 3 (three) times daily.        . diclofenac (VOLTAREN) 0.1 % ophthalmic solution 1 drop as directed.       . fenofibrate 54 MG tablet Take 54 mg by mouth daily.        . folic acid (FOLVITE) 1 MG tablet Take 1 mg by mouth daily.        . insulin aspart (NOVOLOG) 100 UNIT/ML injection Inject into the skin as directed.        . insulin aspart protamine-insulin aspart (NOVOLOG 70/30) (70-30) 100 UNIT/ML injection Inject into the skin as directed.        Marland Kitchen ipratropium-albuterol (DUONEB) 0.5-2.5 (3) MG/3ML SOLN Take 3 mLs by nebulization 4 (four) times daily.        . lansoprazole (PREVACID) 15 MG capsule Take 15 mg by mouth daily.        Marland Kitchen levothyroxine (SYNTHROID, LEVOTHROID) 137 MCG tablet Take 137 mcg by mouth daily.        . methotrexate  (RHEUMATREX) 2.5 MG tablet Take 15 mg by mouth once a week. Caution:Chemotherapy. Protect from light.       . morphine (KADIAN) 20 MG 24 hr capsule Take 20 mg by mouth 2 (two) times daily.        Marland Kitchen oxyCODONE-acetaminophen (PERCOCET) 7.5-325 MG per tablet Take 1 tablet by mouth every 6 (six) hours as needed.        . predniSONE (DELTASONE) 10 MG tablet Take 10 mg by mouth as directed.        . predniSONE (DELTASONE) 5 MG tablet Take 7.5 mg by mouth daily.        . QUEtiapine (SEROQUEL) 400 MG tablet Take 400 mg by mouth daily.        . rizatriptan (MAXALT) 10 MG tablet Take 10 mg by mouth as needed. May repeat in 2 hours if needed       . rosuvastatin (CRESTOR) 10 MG tablet Take 10 mg by mouth daily.        . sertraline (ZOLOFT) 50 MG tablet Take 50 mg by mouth daily.        . traZODone (DESYREL) 100 MG tablet  Take 100 mg by mouth at bedtime.          Past Medical History  Diagnosis Date  . Diabetes mellitus   . Hyperlipidemia   . Weight gain     After quitting smoking in 2005  . COPD (chronic obstructive pulmonary disease)     AB clinical dx; HFA 75% 02/28/10 > 90% Sept 21, 2011    Past Surgical History  Procedure Date  . 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 Emphysema Sister   . Prostate cancer Maternal Grandfather   . Clotting disorder Maternal Grandfather   . Atopy Neg Hx     History   Social History  . Marital Status: Married    Spouse Name: N/A    Number of Children: N/A  . Years of Education: N/A   Occupational History  . Not on file.   Social History Main Topics  . Smoking status: Former Smoker -- 3.0 packs/day for 30 years    Quit date: 07/20/2003  . Smokeless tobacco: Not on file  . Alcohol Use: No  . Drug Use: Not on file  . Sexually Active: Not on file   Other Topics Concern  . Not on file   Social History Narrative   Married with children    ROS:  Positive for constipation,  headaches, joint pains.  Otherwise as stated in the HPI and negative for all other systems.  PHYSICAL EXAM BP 124/78  Pulse 110  Resp 18  Ht 6\' 2"  (1.88 m)  Wt 253 lb (114.76 kg)  BMI 32.48 kg/m2GENERAL:  Well appearing HEENT:  Pupils equal round and reactive, fundi not visualized, oral mucosa unremarkable NECK:  No jugular venous distention, waveform within normal limits, carotid upstroke brisk and symmetric, no bruits, no thyromegaly, tracheostomy LYMPHATICS:  No cervical, inguinal adenopathy LUNGS:  Clear to auscultation bilaterally BACK:  No CVA tenderness CHEST:  Unremarkable HEART:  PMI not displaced or sustained,S1 and S2 within normal limits, no S3, no S4, no clicks, no rubs, no murmurs ABD:  Flat, positive bowel sounds normal in frequency in pitch, no bruits, no rebound, no guarding, no midline pulsatile mass, no hepatomegaly, no splenomegaly EXT:  2 plus pulses throughout, no edema, no cyanosis no clubbing SKIN:  No rashes no nodules NEURO:  Cranial nerves II through XII grossly intact, motor grossly intact throughout PSYCH:  Cognitively intact, oriented to person place and time   EKG:  Sinus tachycardia, rate 110, low voltage, possible old anterior infarct, RSR prime in V1 and V2, QTC prolonged, nonspecific T wave flat in  ASSESSMENT AND PLAN

## 2011-03-31 ENCOUNTER — Ambulatory Visit (HOSPITAL_COMMUNITY): Payer: Medicare Other | Attending: Cardiology

## 2011-03-31 DIAGNOSIS — R9431 Abnormal electrocardiogram [ECG] [EKG]: Secondary | ICD-10-CM

## 2011-03-31 DIAGNOSIS — J4489 Other specified chronic obstructive pulmonary disease: Secondary | ICD-10-CM | POA: Insufficient documentation

## 2011-03-31 DIAGNOSIS — I079 Rheumatic tricuspid valve disease, unspecified: Secondary | ICD-10-CM | POA: Insufficient documentation

## 2011-03-31 DIAGNOSIS — E669 Obesity, unspecified: Secondary | ICD-10-CM | POA: Insufficient documentation

## 2011-03-31 DIAGNOSIS — E119 Type 2 diabetes mellitus without complications: Secondary | ICD-10-CM | POA: Insufficient documentation

## 2011-03-31 DIAGNOSIS — R0609 Other forms of dyspnea: Secondary | ICD-10-CM | POA: Insufficient documentation

## 2011-03-31 DIAGNOSIS — R0989 Other specified symptoms and signs involving the circulatory and respiratory systems: Secondary | ICD-10-CM | POA: Insufficient documentation

## 2011-03-31 DIAGNOSIS — J449 Chronic obstructive pulmonary disease, unspecified: Secondary | ICD-10-CM | POA: Insufficient documentation

## 2011-04-10 ENCOUNTER — Other Ambulatory Visit: Payer: Self-pay | Admitting: Internal Medicine

## 2011-04-16 ENCOUNTER — Telehealth: Payer: Self-pay | Admitting: Internal Medicine

## 2011-04-16 MED ORDER — IPRATROPIUM-ALBUTEROL 0.5-2.5 (3) MG/3ML IN SOLN: 3.0000 mL | Freq: Four times a day (QID) | RESPIRATORY_TRACT | Status: DC

## 2011-04-16 NOTE — Telephone Encounter (Signed)
Spoke with pt. He states ran out of duoneb and needs rx sent to pharm. He states that his new PCP would not refill med for him and advised him to call here for this. I advised happy to refill, but if we are taking over pulm meds again, need to see him soon. Last ov was 12/11'. He states can not sched now but will call back. I advised will send 1 month supply duoneb, and he is to call before runs out to sched appt.

## 2011-04-20 ENCOUNTER — Telehealth: Payer: Self-pay | Admitting: Internal Medicine

## 2011-04-20 ENCOUNTER — Other Ambulatory Visit: Payer: Self-pay | Admitting: *Deleted

## 2011-04-20 MED ORDER — IPRATROPIUM BROMIDE 0.02 % IN SOLN: RESPIRATORY_TRACT | Status: DC

## 2011-04-20 MED ORDER — ALBUTEROL SULFATE (2.5 MG/3ML) 0.083% IN NEBU: INHALATION_SOLUTION | RESPIRATORY_TRACT | Status: DC

## 2011-04-20 MED ORDER — IPRATROPIUM-ALBUTEROL 0.5-2.5 (3) MG/3ML IN SOLN: 3.0000 mL | Freq: Four times a day (QID) | RESPIRATORY_TRACT | Status: DC

## 2011-04-20 NOTE — Telephone Encounter (Signed)
Ok but review of records shows no recent evals > make sure he has f/u with me or Tammy NP before years end

## 2011-04-20 NOTE — Telephone Encounter (Signed)
Spoke with Misty Stanley, pharmacist at [facility]. She states that rx for duoneb must be broken up into to separate rxs, for albuterol and ipratropium- these need to be written and include dx code and faxed to pharm. Will write rxs and place in MW lookat to be signed.

## 2011-04-20 NOTE — Telephone Encounter (Signed)
LMTCB

## 2011-04-21 NOTE — Telephone Encounter (Signed)
LMTCB

## 2011-04-21 NOTE — Telephone Encounter (Signed)
Pt returned call. Joseph [name]  

## 2011-04-21 NOTE — Telephone Encounter (Signed)
Spoke with pt and sched ov with MW for 05/03/11 at 3:15 pm.

## 2011-04-22 ENCOUNTER — Telehealth: Payer: Self-pay | Admitting: Internal Medicine

## 2011-04-22 MED ORDER — IPRATROPIUM BROMIDE 0.02 % IN SOLN: RESPIRATORY_TRACT | Status: DC

## 2011-04-22 NOTE — Telephone Encounter (Signed)
New RX sent to [facility] for Ipratropium Nebs via electronic FAX with diagnosis code included in the directions.  Pharmacist confirmed this is what they needed and nothing further is needed.

## 2011-04-22 NOTE — Telephone Encounter (Addendum)
Joseph [name] of [facility] stated pt's insurance required diagnosis code to be hand-written on the Rx at the Dr's office.  If not, Medicare Part B will refuse the payment.  Joseph [name] @ [facility] in [place] can be reached [phone] Fax number [phone].   Pt is waiting at the Pharmacy.  Joseph [name]

## 2011-05-03 ENCOUNTER — Ambulatory Visit (INDEPENDENT_AMBULATORY_CARE_PROVIDER_SITE_OTHER): Payer: Medicare Other | Admitting: Internal Medicine

## 2011-05-03 ENCOUNTER — Encounter: Payer: Self-pay | Admitting: Internal Medicine

## 2011-05-03 VITALS — BP 110/78 | HR 108 | Temp 98.2°F | Ht 74.0 in | Wt 259.8 lb

## 2011-05-03 DIAGNOSIS — R0609 Other forms of dyspnea: Secondary | ICD-10-CM

## 2011-05-03 DIAGNOSIS — R0989 Other specified symptoms and signs involving the circulatory and respiratory systems: Secondary | ICD-10-CM

## 2011-05-03 MED ORDER — DOXYCYCLINE HYCLATE 100 MG PO TABS: 100.0000 mg | ORAL_TABLET | Freq: Two times a day (BID) | ORAL | Status: AC

## 2011-05-03 NOTE — Patient Instructions (Signed)
Take doxycyline twice daily x 7 days and call if mucus doesn't turn back to clear  Mucinex is the best expectorant on the market.  We will refill your medications for a year and see you back each fall for follow up but sooner on an as needed basis.

## 2011-05-03 NOTE — Assessment & Plan Note (Signed)
Presumably has sign copd/ab but can't quantify due to laryngectomy status.  Clearly flared off duoneb so rx with doxy and then levaquin if sputum doesn't clear.   See instructions for specific recommendations which were reviewed directly with the patient who was given a copy with highlighter outlining the key components.

## 2011-05-03 NOTE — Progress Notes (Signed)
Subjective:     Patient ID: Joseph [name], male   DOB: 1960/08/25, 50 y.o.   MRN: [id number]  HPI  50 yowm quit smoking 2005 @ laryngectomy for throat ca  no trouble breathing prior to surgery but sob ever since and progressively getting worse.   January 26, 2010 cc doe x > slow walking even at grocery store some better with use of duoneb assoc with with minimal mucoid sputum and sign wt gain since underwent tracheotomy. rec duoneb 4x daily   February 27, 2010 4 wk followup. Breathing some improved. He gets out of breath when goes out in the heat. No new complaints today.using duonebs and lot of albuterol which helps alot. no purulent sputum.Work on inhaler technique: relax and blow all the way out then take a nice smooth deep breath back in, triggering the inhaler at same time you start breathing in  Symbicort 160 .2 puffs first thing in am and 2 puffs again in pm about 12 hours later   April 08, 2010 4 wk followup. Pt states that his breathing has improved alot since started symbicort. He still has some chest congestion but states that symbicort has helped to break this up so that he can cough it up- was dark green but now light green x 2 wks. He was recently dxed with having RA and sees Dr Sharmon Revere in [place] - rec symbicort or dulera preferes symbicort.   July 06, 2010 ov cc sob worse since cough with green sputum, improving on rx.  rec 1) Continue symbicrt 160 2 puffs first thing in am and 2 puffs again in pm about 12 hours later  2) Prednisone 10 mg 4 each am x 2days, 2x2days, 1x2days and stop  3) add proaire if needed and use nebulizer to back up the proaire   05/03/2011 f/u ov/Wert overall  worse x one month since stopped nebulizer with duonebcc productive cough with green phlegm and increase sob on symbicort 2 bid per trach ostomy but finds the duoneb helps him cough up mucus easier.   Sleeping ok without nocturnal  or early am exacerbation  of respiratory  c/o's or need for noct  saba. Also denies any obvious fluctuation of symptoms with weather or environmental changes or other aggravating or alleviating factors except as outlined above   Pt denies any significant sore throat, dysphagia, itching, sneezing, nasal congestion or excess secretions, fever, chills, sweats, unintended wt loss, pleuritic or exertional cp, hempoptysis, orthopnea pnd or leg swelling.    Allergies  No Known Drug Allergies    Past Medical History:  Diabetes  Hyperlipidemia  Weight gain after quit smoking in 2005  COPD/ AB clinical dx  - HFA 75% February 28, 2010 > 90% April 08, 2010          Review of Systems     Objective:   Physical Exam     somber amb obese wm talking thru a voice box  January 26, 2010 > 248 February 28, 2010 > 248 April 08, 2010 > 259 July 06, 2010 > 05/03/2011  259 HEENT mild turbinate edema. Oropharynx no thrush or excess pnd or cobblestoning. No JVD or cervical adenopathy. Mild accessory muscle hypertrophy. Trachea midline, nl thryroid. Chest was hyperinflated by percussion with diminished breath sounds and moderate increased exp time without wheeze. Hoover sign positive at mid inspiration. Regular rate and rhythm without murmur gallop or rub or increase P2 or edema. Abd: massively obese with no hsm, nl excursion. Ext warm  without cyanosis or clubbing. Assessment:          Plan:

## 2011-07-22 DIAGNOSIS — IMO0001 Reserved for inherently not codable concepts without codable children: Secondary | ICD-10-CM | POA: Diagnosis not present

## 2011-07-27 DIAGNOSIS — M545 Low back pain, unspecified: Secondary | ICD-10-CM | POA: Diagnosis not present

## 2011-07-27 DIAGNOSIS — E1142 Type 2 diabetes mellitus with diabetic polyneuropathy: Secondary | ICD-10-CM | POA: Diagnosis not present

## 2011-07-27 DIAGNOSIS — M47817 Spondylosis without myelopathy or radiculopathy, lumbosacral region: Secondary | ICD-10-CM | POA: Diagnosis not present

## 2011-07-27 DIAGNOSIS — E1149 Type 2 diabetes mellitus with other diabetic neurological complication: Secondary | ICD-10-CM | POA: Diagnosis not present

## 2011-08-24 DIAGNOSIS — G894 Chronic pain syndrome: Secondary | ICD-10-CM | POA: Diagnosis not present

## 2011-08-24 DIAGNOSIS — M47812 Spondylosis without myelopathy or radiculopathy, cervical region: Secondary | ICD-10-CM | POA: Diagnosis not present

## 2011-08-24 DIAGNOSIS — M7710 Lateral epicondylitis, unspecified elbow: Secondary | ICD-10-CM | POA: Diagnosis not present

## 2011-08-30 DIAGNOSIS — J019 Acute sinusitis, unspecified: Secondary | ICD-10-CM | POA: Diagnosis not present

## 2011-08-30 DIAGNOSIS — J309 Allergic rhinitis, unspecified: Secondary | ICD-10-CM | POA: Diagnosis not present

## 2011-09-21 ENCOUNTER — Other Ambulatory Visit: Payer: Self-pay | Admitting: Internal Medicine

## 2011-09-21 DIAGNOSIS — E1142 Type 2 diabetes mellitus with diabetic polyneuropathy: Secondary | ICD-10-CM | POA: Diagnosis not present

## 2011-09-21 DIAGNOSIS — M47817 Spondylosis without myelopathy or radiculopathy, lumbosacral region: Secondary | ICD-10-CM | POA: Diagnosis not present

## 2011-09-21 DIAGNOSIS — G894 Chronic pain syndrome: Secondary | ICD-10-CM | POA: Diagnosis not present

## 2011-09-28 DIAGNOSIS — M47817 Spondylosis without myelopathy or radiculopathy, lumbosacral region: Secondary | ICD-10-CM | POA: Diagnosis not present

## 2011-10-19 DIAGNOSIS — G894 Chronic pain syndrome: Secondary | ICD-10-CM | POA: Diagnosis not present

## 2011-10-19 DIAGNOSIS — M47817 Spondylosis without myelopathy or radiculopathy, lumbosacral region: Secondary | ICD-10-CM | POA: Diagnosis not present

## 2011-10-19 DIAGNOSIS — M7710 Lateral epicondylitis, unspecified elbow: Secondary | ICD-10-CM | POA: Diagnosis not present

## 2011-10-21 DIAGNOSIS — E785 Hyperlipidemia, unspecified: Secondary | ICD-10-CM | POA: Diagnosis not present

## 2011-10-21 DIAGNOSIS — E039 Hypothyroidism, unspecified: Secondary | ICD-10-CM | POA: Diagnosis not present

## 2011-10-21 DIAGNOSIS — E559 Vitamin D deficiency, unspecified: Secondary | ICD-10-CM | POA: Diagnosis not present

## 2011-10-21 DIAGNOSIS — IMO0001 Reserved for inherently not codable concepts without codable children: Secondary | ICD-10-CM | POA: Diagnosis not present

## 2011-10-29 ENCOUNTER — Encounter (HOSPITAL_COMMUNITY): Payer: Self-pay | Admitting: Emergency Medicine

## 2011-10-29 ENCOUNTER — Inpatient Hospital Stay (HOSPITAL_COMMUNITY)
Admission: EM | Admit: 2011-10-29 | Discharge: 2011-10-31 | DRG: 682 | Disposition: A | Discharge status: Home / Self Care | Payer: Medicare Other | Attending: Internal Medicine | Admitting: Internal Medicine

## 2011-10-29 DIAGNOSIS — G4730 Sleep apnea, unspecified: Secondary | ICD-10-CM

## 2011-10-29 DIAGNOSIS — E119 Type 2 diabetes mellitus without complications: Secondary | ICD-10-CM | POA: Diagnosis present

## 2011-10-29 DIAGNOSIS — R0602 Shortness of breath: Secondary | ICD-10-CM | POA: Diagnosis not present

## 2011-10-29 DIAGNOSIS — Z8589 Personal history of malignant neoplasm of other organs and systems: Secondary | ICD-10-CM

## 2011-10-29 DIAGNOSIS — N179 Acute kidney failure, unspecified: Secondary | ICD-10-CM | POA: Diagnosis not present

## 2011-10-29 DIAGNOSIS — J4489 Other specified chronic obstructive pulmonary disease: Secondary | ICD-10-CM | POA: Diagnosis present

## 2011-10-29 DIAGNOSIS — E1169 Type 2 diabetes mellitus with other specified complication: Secondary | ICD-10-CM | POA: Diagnosis present

## 2011-10-29 DIAGNOSIS — M069 Rheumatoid arthritis, unspecified: Secondary | ICD-10-CM

## 2011-10-29 DIAGNOSIS — G9340 Encephalopathy, unspecified: Secondary | ICD-10-CM | POA: Diagnosis not present

## 2011-10-29 DIAGNOSIS — J449 Chronic obstructive pulmonary disease, unspecified: Secondary | ICD-10-CM | POA: Diagnosis present

## 2011-10-29 DIAGNOSIS — G43909 Migraine, unspecified, not intractable, without status migrainosus: Secondary | ICD-10-CM | POA: Diagnosis present

## 2011-10-29 DIAGNOSIS — R51 Headache: Secondary | ICD-10-CM

## 2011-10-29 DIAGNOSIS — R339 Retention of urine, unspecified: Secondary | ICD-10-CM | POA: Diagnosis present

## 2011-10-29 DIAGNOSIS — E785 Hyperlipidemia, unspecified: Secondary | ICD-10-CM | POA: Diagnosis present

## 2011-10-29 DIAGNOSIS — E039 Hypothyroidism, unspecified: Secondary | ICD-10-CM | POA: Diagnosis not present

## 2011-10-29 DIAGNOSIS — G319 Degenerative disease of nervous system, unspecified: Secondary | ICD-10-CM | POA: Diagnosis not present

## 2011-10-29 DIAGNOSIS — R0609 Other forms of dyspnea: Secondary | ICD-10-CM | POA: Diagnosis not present

## 2011-10-29 DIAGNOSIS — Z930 Tracheostomy status: Secondary | ICD-10-CM

## 2011-10-29 DIAGNOSIS — R4182 Altered mental status, unspecified: Secondary | ICD-10-CM | POA: Diagnosis not present

## 2011-10-29 DIAGNOSIS — N19 Unspecified kidney failure: Secondary | ICD-10-CM

## 2011-10-29 DIAGNOSIS — J9819 Other pulmonary collapse: Secondary | ICD-10-CM | POA: Diagnosis not present

## 2011-10-29 DIAGNOSIS — R0989 Other specified symptoms and signs involving the circulatory and respiratory systems: Secondary | ICD-10-CM | POA: Diagnosis not present

## 2011-10-29 DIAGNOSIS — G9349 Other encephalopathy: Secondary | ICD-10-CM | POA: Diagnosis present

## 2011-10-29 DIAGNOSIS — R9431 Abnormal electrocardiogram [ECG] [EKG]: Secondary | ICD-10-CM

## 2011-10-29 DIAGNOSIS — H538 Other visual disturbances: Secondary | ICD-10-CM | POA: Diagnosis not present

## 2011-10-29 NOTE — ED Notes (Signed)
Pt presented to the ER with c/o possible hyperglycemia, states not able to check his CBG at home, out of the strips, pt further explains that the last time he was seen and " feeling this way" he was hospitalized

## 2011-10-30 ENCOUNTER — Emergency Department (HOSPITAL_COMMUNITY): Payer: Medicare Other

## 2011-10-30 ENCOUNTER — Encounter (HOSPITAL_COMMUNITY): Payer: Self-pay | Admitting: Internal Medicine

## 2011-10-30 DIAGNOSIS — Z8589 Personal history of malignant neoplasm of other organs and systems: Secondary | ICD-10-CM | POA: Diagnosis not present

## 2011-10-30 DIAGNOSIS — R51 Headache: Secondary | ICD-10-CM | POA: Diagnosis not present

## 2011-10-30 DIAGNOSIS — G9349 Other encephalopathy: Secondary | ICD-10-CM | POA: Diagnosis present

## 2011-10-30 DIAGNOSIS — H538 Other visual disturbances: Secondary | ICD-10-CM | POA: Diagnosis not present

## 2011-10-30 DIAGNOSIS — G319 Degenerative disease of nervous system, unspecified: Secondary | ICD-10-CM | POA: Diagnosis not present

## 2011-10-30 DIAGNOSIS — G9340 Encephalopathy, unspecified: Secondary | ICD-10-CM | POA: Diagnosis not present

## 2011-10-30 DIAGNOSIS — R339 Retention of urine, unspecified: Secondary | ICD-10-CM | POA: Diagnosis present

## 2011-10-30 DIAGNOSIS — E119 Type 2 diabetes mellitus without complications: Secondary | ICD-10-CM | POA: Diagnosis present

## 2011-10-30 DIAGNOSIS — R0989 Other specified symptoms and signs involving the circulatory and respiratory systems: Secondary | ICD-10-CM | POA: Diagnosis not present

## 2011-10-30 DIAGNOSIS — M545 Low back pain, unspecified: Secondary | ICD-10-CM | POA: Diagnosis not present

## 2011-10-30 DIAGNOSIS — Z930 Tracheostomy status: Secondary | ICD-10-CM | POA: Diagnosis not present

## 2011-10-30 DIAGNOSIS — E785 Hyperlipidemia, unspecified: Secondary | ICD-10-CM | POA: Diagnosis present

## 2011-10-30 DIAGNOSIS — R4182 Altered mental status, unspecified: Secondary | ICD-10-CM | POA: Diagnosis not present

## 2011-10-30 DIAGNOSIS — G43909 Migraine, unspecified, not intractable, without status migrainosus: Secondary | ICD-10-CM | POA: Diagnosis present

## 2011-10-30 DIAGNOSIS — J449 Chronic obstructive pulmonary disease, unspecified: Secondary | ICD-10-CM | POA: Diagnosis present

## 2011-10-30 DIAGNOSIS — R0602 Shortness of breath: Secondary | ICD-10-CM | POA: Diagnosis not present

## 2011-10-30 DIAGNOSIS — N179 Acute kidney failure, unspecified: Secondary | ICD-10-CM | POA: Diagnosis present

## 2011-10-30 DIAGNOSIS — E039 Hypothyroidism, unspecified: Secondary | ICD-10-CM | POA: Diagnosis present

## 2011-10-30 DIAGNOSIS — R0609 Other forms of dyspnea: Secondary | ICD-10-CM | POA: Diagnosis not present

## 2011-10-30 DIAGNOSIS — IMO0002 Reserved for concepts with insufficient information to code with codable children: Secondary | ICD-10-CM | POA: Diagnosis not present

## 2011-10-30 DIAGNOSIS — E1169 Type 2 diabetes mellitus with other specified complication: Secondary | ICD-10-CM | POA: Diagnosis present

## 2011-10-30 DIAGNOSIS — R112 Nausea with vomiting, unspecified: Secondary | ICD-10-CM | POA: Diagnosis not present

## 2011-10-30 DIAGNOSIS — K5900 Constipation, unspecified: Secondary | ICD-10-CM | POA: Diagnosis not present

## 2011-10-30 DIAGNOSIS — J9819 Other pulmonary collapse: Secondary | ICD-10-CM | POA: Diagnosis not present

## 2011-10-30 DIAGNOSIS — E1165 Type 2 diabetes mellitus with hyperglycemia: Secondary | ICD-10-CM | POA: Diagnosis not present

## 2011-10-30 LAB — CBC
Hemoglobin: 11.5 g/dL — ABNORMAL LOW (ref 13.0–17.0)
MCH: 28.8 pg (ref 26.0–34.0)
MCHC: 31.3 g/dL (ref 30.0–36.0)
MCV: 91.8 fL (ref 78.0–100.0)
Platelets: 241 10*3/uL (ref 150–400)
RBC: 4 MIL/uL — ABNORMAL LOW (ref 4.22–5.81)

## 2011-10-30 LAB — URINALYSIS, ROUTINE W REFLEX MICROSCOPIC
Leukocytes, UA: NEGATIVE
Nitrite: NEGATIVE
Specific Gravity, Urine: 1.022 (ref 1.005–1.030)
Urobilinogen, UA: 0.2 mg/dL (ref 0.0–1.0)
pH: 6 (ref 5.0–8.0)

## 2011-10-30 LAB — DIFFERENTIAL
Basophils Relative: 1 % (ref 0–1)
Eosinophils Absolute: 0.3 10*3/uL (ref 0.0–0.7)
Eosinophils Relative: 4 % (ref 0–5)
Lymphs Abs: 2.1 10*3/uL (ref 0.7–4.0)
Monocytes Relative: 10 % (ref 3–12)
Neutrophils Relative %: 53 % (ref 43–77)

## 2011-10-30 LAB — COMPREHENSIVE METABOLIC PANEL
CO2: 25 mEq/L (ref 19–32)
Calcium: 9.3 mg/dL (ref 8.4–10.5)
Creatinine, Ser: 3.31 mg/dL — ABNORMAL HIGH (ref 0.50–1.35)
GFR calc Af Amer: 23 mL/min — ABNORMAL LOW (ref >90)
GFR calc non Af Amer: 20 mL/min — ABNORMAL LOW (ref >90)
Glucose, Bld: 123 mg/dL — ABNORMAL HIGH (ref 70–99)
Total Protein: 7.7 g/dL (ref 6.0–8.3)

## 2011-10-30 LAB — RAPID URINE DRUG SCREEN, HOSP PERFORMED
Barbiturates: NOT DETECTED
Benzodiazepines: POSITIVE — AB

## 2011-10-30 LAB — PROCALCITONIN: Procalcitonin: <0.1 ng/mL

## 2011-10-30 LAB — CARDIAC PANEL(CRET KIN+CKTOT+MB+TROPI)
CK, MB: 3.9 ng/mL (ref 0.3–4.0)
Relative Index: 0.7 (ref 0.0–2.5)
Relative Index: 1.4 (ref 0.0–2.5)
Relative Index: 1.3 (ref 0.0–2.5)
Total CK: 527 U/L — ABNORMAL HIGH (ref 7–232)
Total CK: 910 U/L — ABNORMAL HIGH (ref 7–232)
Troponin I: <0.3 ng/mL (ref <0.30)

## 2011-10-30 LAB — HEMOGLOBIN A1C: Hgb A1c MFr Bld: 6.4 % — ABNORMAL HIGH (ref <5.7)

## 2011-10-30 LAB — POCT I-STAT, CHEM 8
BUN: 35 mg/dL — ABNORMAL HIGH (ref 6–23)
Calcium, Ion: 1.14 mmol/L (ref 1.12–1.32)
Hemoglobin: 13.9 g/dL (ref 13.0–17.0)
TCO2: 24 mmol/L (ref 0–100)

## 2011-10-30 LAB — GLUCOSE, CAPILLARY
Glucose-Capillary: 117 mg/dL — ABNORMAL HIGH (ref 70–99)
Glucose-Capillary: 130 mg/dL — ABNORMAL HIGH (ref 70–99)

## 2011-10-30 LAB — LACTIC ACID, PLASMA: Lactic Acid, Venous: 0.8 mmol/L (ref 0.5–2.2)

## 2011-10-30 MED ORDER — ENOXAPARIN SODIUM 40 MG/0.4ML ~~LOC~~ SOLN
40.0000 mg | SUBCUTANEOUS | Status: DC
  Administered 2011-10-30: 40 mg via SUBCUTANEOUS
  Filled 2011-10-30: qty 0.4

## 2011-10-30 MED ORDER — ENOXAPARIN SODIUM 60 MG/0.6ML ~~LOC~~ SOLN
60.0000 mg | SUBCUTANEOUS | Status: DC
  Administered 2011-10-31: 60 mg via SUBCUTANEOUS
  Filled 2011-10-30 (×2): qty 0.6

## 2011-10-30 MED ORDER — SODIUM CHLORIDE 0.9 % IV SOLN: INTRAVENOUS | Status: AC

## 2011-10-30 MED ORDER — ALUM & MAG HYDROXIDE-SIMETH 200-200-20 MG/5ML PO SUSP: 30.0000 mL | Freq: Four times a day (QID) | ORAL | Status: DC | PRN

## 2011-10-30 MED ORDER — HYDROMORPHONE HCL PF 1 MG/ML IJ SOLN
0.5000 mg | INTRAMUSCULAR | Status: DC | PRN
  Administered 2011-10-30: 1 mg via INTRAVENOUS
  Filled 2011-10-30: qty 1

## 2011-10-30 MED ORDER — ZOLPIDEM TARTRATE 5 MG PO TABS: 5.0000 mg | ORAL_TABLET | Freq: Every evening | ORAL | Status: DC | PRN

## 2011-10-30 MED ORDER — ACETAMINOPHEN 650 MG RE SUPP: 650.0000 mg | Freq: Four times a day (QID) | RECTAL | Status: DC | PRN

## 2011-10-30 MED ORDER — ONDANSETRON HCL 4 MG/2ML IJ SOLN
4.0000 mg | Freq: Four times a day (QID) | INTRAMUSCULAR | Status: DC | PRN
  Administered 2011-10-31 (×2): 4 mg via INTRAVENOUS
  Filled 2011-10-30 (×2): qty 2

## 2011-10-30 MED ORDER — LORAZEPAM 1 MG PO TABS
1.0000 mg | ORAL_TABLET | Freq: Once | ORAL | Status: AC
  Administered 2011-10-30: 1 mg via ORAL
  Filled 2011-10-30: qty 1

## 2011-10-30 MED ORDER — ONDANSETRON HCL 4 MG PO TABS: 4.0000 mg | ORAL_TABLET | Freq: Four times a day (QID) | ORAL | Status: DC | PRN

## 2011-10-30 MED ORDER — SODIUM CHLORIDE 0.9 % IV SOLN
INTRAVENOUS | Status: DC
  Administered 2011-10-30 – 2011-10-31 (×4): via INTRAVENOUS

## 2011-10-30 MED ORDER — INSULIN ASPART 100 UNIT/ML ~~LOC~~ SOLN
0.0000 [IU] | Freq: Three times a day (TID) | SUBCUTANEOUS | Status: DC
  Administered 2011-10-30 – 2011-10-31 (×4): 1 [IU] via SUBCUTANEOUS
  Filled 2011-10-30: qty 1

## 2011-10-30 MED ORDER — INSULIN ASPART 100 UNIT/ML ~~LOC~~ SOLN: 0.0000 [IU] | Freq: Every day | SUBCUTANEOUS | Status: DC

## 2011-10-30 MED ORDER — OXYCODONE HCL 5 MG PO TABS
5.0000 mg | ORAL_TABLET | ORAL | Status: DC | PRN
  Administered 2011-10-30 (×2): 5 mg via ORAL
  Filled 2011-10-30 (×2): qty 1

## 2011-10-30 MED ORDER — ACETAMINOPHEN 325 MG PO TABS
650.0000 mg | ORAL_TABLET | Freq: Four times a day (QID) | ORAL | Status: DC | PRN
  Administered 2011-10-30: 650 mg via ORAL
  Filled 2011-10-30: qty 2

## 2011-10-30 MED ORDER — ONDANSETRON HCL 4 MG/2ML IJ SOLN: 4.0000 mg | Freq: Three times a day (TID) | INTRAMUSCULAR | Status: AC | PRN

## 2011-10-30 NOTE — ED Notes (Signed)
Pt in MRI.

## 2011-10-30 NOTE — ED Notes (Signed)
FAmily reports pt was complaining of blurred vision and not feeling "right"

## 2011-10-30 NOTE — ED Notes (Signed)
Dr. Lovell Sheehan at the bedside assessing patient

## 2011-10-30 NOTE — ED Notes (Signed)
Wife at bedside, Wife sts pt was confused yesterday but is a lot better today

## 2011-10-30 NOTE — ED Notes (Signed)
MRI called, PT will be going to MRI

## 2011-10-30 NOTE — H&P (Signed)
DATE OF ADMISSION:  10/30/2011  PCP:    Rudi Heap, MD, MD   Chief Complaint:  Confusion   HPI: Joseph [name] is an 51 y.o. male with multiple medical problems sent to ED due to complaints of confusion.     Past Medical History  Diagnosis Date  . Diabetes mellitus   . Hyperlipidemia   . Weight gain     After quitting smoking in 2005  . COPD (chronic obstructive pulmonary disease)     AB clinical dx; HFA 75% 02/28/10 > 90% Sept 21, 2011    Past Surgical History  Procedure Date  . 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 Knee arthroscopy     right    Medications:  HOME MEDS: Prior to Admission medications   Medication Sig Start Date End Date Taking? Authorizing Provider  albuterol (PROVENTIL HFA;VENTOLIN HFA) 108 (90 BASE) MCG/ACT inhaler Inhale 2 puffs into the lungs every 6 (six) hours as needed.     Yes Historical Provider, MD  albuterol (PROVENTIL) (2.5 MG/3ML) 0.083% nebulizer solution 1 vial in nebulizer four times daily 04/20/11  Yes Nyoka Cowden, MD  ALPRAZolam Prudy Feeler) 1 MG tablet Take 1 mg by mouth 4 (four) times daily as needed.     Yes Historical Provider, MD  amitriptyline (ELAVIL) 150 MG tablet Take 100 mg by mouth at bedtime.    Yes Historical Provider, MD  cyclobenzaprine (FLEXERIL) 10 MG tablet Take 10 mg by mouth 3 (three) times daily.     Yes Historical Provider, MD  fenofibrate 54 MG tablet Take 54 mg by mouth daily.     Yes Historical Provider, MD  gabapentin (NEURONTIN) 300 MG capsule Take 400 mg by mouth 3 (three) times daily.    Yes Historical Provider, MD  insulin aspart protamine-insulin aspart (NOVOLOG 70/30) (70-30) 100 UNIT/ML injection Inject 71 Units into the skin See admin instructions. Patient takes 38 units in the morning and 33 units at night   Yes Historical Provider, MD  ipratropium (ATROVENT) 0.02 % nebulizer solution 1 vial in nebulizer four times daily,   DX:  496 04/22/11 04/21/12 Yes Nyoka Cowden, MD    ipratropium-albuterol (DUONEB) 0.5-2.5 (3) MG/3ML SOLN Take 3 mLs by nebulization 4 (four) times daily. 04/20/11  Yes Nyoka Cowden, MD  levothyroxine (SYNTHROID, LEVOTHROID) 137 MCG tablet Take 137 mcg by mouth daily.     Yes Historical Provider, MD  lisinopril (PRINIVIL,ZESTRIL) 10 MG tablet Take 10 mg by mouth daily.   Yes Historical Provider, MD  morphine (KADIAN) 60 MG 24 hr capsule Take 60 mg by mouth daily.     Yes Historical Provider, MD  oxyCODONE-acetaminophen (PERCOCET) 7.5-325 MG per tablet Take 1 tablet by mouth every 6 (six) hours as needed.     Yes Historical Provider, MD  pantoprazole (PROTONIX) 20 MG tablet Take 20 mg by mouth daily.   Yes Historical Provider, MD  QUEtiapine (SEROQUEL) 400 MG tablet Take 400 mg by mouth daily.     Yes Historical Provider, MD  rizatriptan (MAXALT) 10 MG tablet Take 10 mg by mouth as needed. May repeat in 2 hours if needed    Yes Historical Provider, MD  rosuvastatin (CRESTOR) 10 MG tablet Take 10 mg by mouth daily.     Yes Historical Provider, MD  sertraline (ZOLOFT) 50 MG tablet Take 100 mg by mouth daily.    Yes Historical Provider, MD  SYMBICORT 160-4.5 MCG/ACT inhaler INHALE 2 PUFFS FIRST THING  IN AM AND 2 PUFFS AGAIN IN PM ABOUT 12 HOURS LATER 09/21/11 09/21/12 Yes Nyoka Cowden, MD  Tamsulosin HCl (FLOMAX) 0.4 MG CAPS Take 0.4 mg by mouth daily.     Yes Historical Provider, MD  traZODone (DESYREL) 100 MG tablet Take 100 mg by mouth at bedtime.     Yes Historical Provider, MD    Allergies:  No Known Allergies  Social History:   reports that he quit smoking about 8 years ago. He does not have any smokeless tobacco history on file. He reports that he does not drink alcohol. His drug history not on file.  Family History: Family History  Problem Relation Age of Onset  . Emphysema Sister   . Prostate cancer Maternal Grandfather   . Clotting disorder Maternal Grandfather   . Atopy Neg Hx     Review of Systems:  The patient denies  anorexia, fever, weight loss, vision loss, decreased hearing, hoarseness, chest pain, syncope, dyspnea on exertion, peripheral edema, balance deficits, hemoptysis, abdominal pain, melena, hematochezia, severe indigestion/heartburn, hematuria, incontinence, genital sores, muscle weakness, suspicious skin lesions, transient blindness, difficulty walking, depression, unusual weight change, abnormal bleeding, enlarged lymph nodes, angioedema, and breast masses.   Physical Exam:  GEN:  51 year old well nourished well developed Caucasian male examined  and in no acute distress; cooperative with exam Filed Vitals:   10/30/11 0243 10/30/11 0346 10/30/11 0452 10/30/11 0608  BP: 113/62     Pulse: 101  95 97  Resp: 14  17   SpO2: 96% 98% 94% 97%   Blood pressure 113/62, pulse 97, resp. rate 17, SpO2 97.00%. PSYCH: He is alert and oriented x4; does not appear anxious does not appear depressed; affect is normal HEENT: Normocephalic and Atraumatic, Mucous membranes pink; PERRLA; EOM intact; Fundi:  Benign;  No scleral icterus, Nares: Patent, Oropharynx: Fair Dentition, Neck: Tracheostomy present,  FROM, no cervical lymphadenopathy nor thyromegaly or carotid bruit; no JVD; Breasts:: Not examined CHEST WALL: No tenderness CHEST: Normal respiration, clear to auscultation bilaterally HEART: Regular rate and rhythm; no murmurs rubs or gallops BACK: No kyphosis or scoliosis; no CVA tenderness ABDOMEN: Positive Bowel Sounds, soft non-tender; no masses, no organomegaly. Rectal Exam: Not done EXTREMITIES: No bone or joint deformity; age-appropriate arthropathy of the hands and knees; no cyanosis, clubbing or edema; no ulcerations. Genitalia: not examined PULSES: 2+ and symmetric SKIN: Normal hydration no rash or ulceration CNS: Cranial nerves 2-12 grossly intact no focal neurologic deficit   Labs & Imaging Results for orders placed during the hospital encounter of 10/29/11 (from the past 48 hour(s))  GLUCOSE,  CAPILLARY     Status: Abnormal   Collection Time   10/30/11 12:05 AM      Component Value Range Comment   Glucose-Capillary 130 (*) 70 - 99 (mg/dL)    Comment 1 Notify RN     CBC     Status: Abnormal   Collection Time   10/30/11  1:27 AM      Component Value Range Comment   WBC 6.3  4.0 - 10.5 (K/uL)    RBC 4.00 (*) 4.22 - 5.81 (MIL/uL)    Hemoglobin 11.5 (*) 13.0 - 17.0 (g/dL)    HCT 91.4 (*) 78.2 - 52.0 (%)    MCV 91.8  78.0 - 100.0 (fL)    MCH 28.8  26.0 - 34.0 (pg)    MCHC 31.3  30.0 - 36.0 (g/dL)    RDW 95.6  21.3 - 08.6 (%)  Platelets 241  150 - 400 (K/uL)   DIFFERENTIAL     Status: Normal   Collection Time   10/30/11  1:27 AM      Component Value Range Comment   Neutrophils Relative 53  43 - 77 (%)    Neutro Abs 3.3  1.7 - 7.7 (K/uL)    Lymphocytes Relative 33  12 - 46 (%)    Lymphs Abs 2.1  0.7 - 4.0 (K/uL)    Monocytes Relative 10  3 - 12 (%)    Monocytes Absolute 0.6  0.1 - 1.0 (K/uL)    Eosinophils Relative 4  0 - 5 (%)    Eosinophils Absolute 0.3  0.0 - 0.7 (K/uL)    Basophils Relative 1  0 - 1 (%)    Basophils Absolute 0.0  0.0 - 0.1 (K/uL)   AMMONIA     Status: Normal   Collection Time   10/30/11  1:27 AM      Component Value Range Comment   Ammonia 32  11 - 60 (umol/L)   COMPREHENSIVE METABOLIC PANEL     Status: Abnormal   Collection Time   10/30/11  1:27 AM      Component Value Range Comment   Sodium 131 (*) 135 - 145 (mEq/L)    Potassium 4.5  3.5 - 5.1 (mEq/L)    Chloride 96  96 - 112 (mEq/L)    CO2 25  19 - 32 (mEq/L)    Glucose, Bld 123 (*) 70 - 99 (mg/dL)    BUN 30 (*) 6 - 23 (mg/dL)    Creatinine, Ser 1.61 (*) 0.50 - 1.35 (mg/dL)    Calcium 9.3  8.4 - 10.5 (mg/dL)    Total Protein 7.7  6.0 - 8.3 (g/dL)    Albumin 4.0  3.5 - 5.2 (g/dL)    AST 26  0 - 37 (U/L)    ALT 18  0 - 53 (U/L)    Alkaline Phosphatase 27 (*) 39 - 117 (U/L)    Total Bilirubin 0.2 (*) 0.3 - 1.2 (mg/dL)    GFR calc non Af Amer 20 (*) >90 (mL/min)    GFR calc Af Amer 23 (*)  >90 (mL/min)   LACTIC ACID, PLASMA     Status: Normal   Collection Time   10/30/11  1:27 AM      Component Value Range Comment   Lactic Acid, Venous 0.8  0.5 - 2.2 (mmol/L)   PROCALCITONIN     Status: Normal   Collection Time   10/30/11  1:27 AM      Component Value Range Comment   Procalcitonin <0.10     ETHANOL     Status: Normal   Collection Time   10/30/11  1:27 AM      Component Value Range Comment   Alcohol, Ethyl (B) <11  0 - 11 (mg/dL)   CARDIAC PANEL(CRET KIN+CKTOT+MB+TROPI)     Status: Abnormal   Collection Time   10/30/11  1:27 AM      Component Value Range Comment   Total CK 527 (*) 7 - 232 (U/L)    CK, MB 3.9  0.3 - 4.0 (ng/mL)    Troponin I <0.30  <0.30 (ng/mL)    Relative Index 0.7  0.0 - 2.5    POCT I-STAT, CHEM 8     Status: Abnormal   Collection Time   10/30/11  1:57 AM      Component Value Range Comment   Sodium 135  135 - 145 (mEq/L)    Potassium 5.0  3.5 - 5.1 (mEq/L)    Chloride 106  96 - 112 (mEq/L)    BUN 35 (*) 6 - 23 (mg/dL)    Creatinine, Ser 1.61 (*) 0.50 - 1.35 (mg/dL)    Glucose, Bld 096 (*) 70 - 99 (mg/dL)    Calcium, Ion 0.45  1.12 - 1.32 (mmol/L)    TCO2 24  0 - 100 (mmol/L)    Hemoglobin 13.9  13.0 - 17.0 (g/dL)    HCT 40.9  81.1 - 91.4 (%)   URINALYSIS, ROUTINE W REFLEX MICROSCOPIC     Status: Abnormal   Collection Time   10/30/11  2:13 AM      Component Value Range Comment   Color, Urine YELLOW  YELLOW     APPearance CLOUDY (*) CLEAR     Specific Gravity, Urine 1.022  1.005 - 1.030     pH 6.0  5.0 - 8.0     Glucose, UA NEGATIVE  NEGATIVE (mg/dL)    Hgb urine dipstick NEGATIVE  NEGATIVE     Bilirubin Urine SMALL (*) NEGATIVE     Ketones, ur NEGATIVE  NEGATIVE (mg/dL)    Protein, ur NEGATIVE  NEGATIVE (mg/dL)    Urobilinogen, UA 0.2  0.0 - 1.0 (mg/dL)    Nitrite NEGATIVE  NEGATIVE     Leukocytes, UA NEGATIVE  NEGATIVE  MICROSCOPIC NOT DONE ON URINES WITH NEGATIVE PROTEIN, BLOOD, LEUKOCYTES, NITRITE, OR GLUCOSE <1000 mg/dL.  URINE RAPID  DRUG SCREEN (HOSP PERFORMED)     Status: Abnormal   Collection Time   10/30/11  2:14 AM      Component Value Range Comment   Opiates POSITIVE (*) NONE DETECTED     Cocaine NONE DETECTED  NONE DETECTED     Benzodiazepines POSITIVE (*) NONE DETECTED     Amphetamines NONE DETECTED  NONE DETECTED     Tetrahydrocannabinol NONE DETECTED  NONE DETECTED     Barbiturates NONE DETECTED  NONE DETECTED     Ct Head Wo Contrast  10/30/2011  *RADIOLOGY REPORT*  Clinical Data: Severe headache.  Blurred vision.  CT HEAD WITHOUT CONTRAST  Technique:  Contiguous axial images were obtained from the base of the skull through the vertex without contrast.  Comparison: 10/19/2009  Findings: There is no evidence of intracranial hemorrhage, brain edema or other signs of acute infarction.  There is no evidence of intracranial mass lesion or mass effect.  No abnormal extra-axial fluid collections are identified.  Mild diffuse cerebral atrophy is again noted.  Ventricles are stable in size.  No other intracranial abnormality identified.  No evidence of skull fracture.  IMPRESSION:  1.  No acute intracranial abnormality. 2.  Stable cerebral atrophy.  Original Report Authenticated By: Danae Orleans, M.D.   Dg Chest Port 1 View  10/30/2011  *RADIOLOGY REPORT*  Clinical Data: Short of breath.  Cough.  Altered mental status.  PORTABLE CHEST - 1 VIEW  Comparison: 07/06/2010  Findings: Low lung volumes are seen.  There is mild atelectasis in both lung bases.  No evidence of pulmonary consolidation or edema. No evidence of pleural effusion.  Heart size is stable and within normal limits allowing for low lung volumes.  IMPRESSION: Low lung volumes with mild bibasilar atelectasis.  Original Report Authenticated By: Danae Orleans, M.D.      Assessment: Present on Admission:  .Encephalopathy acute .ARF (acute renal failure) .HYPOTHYROIDISM .HYPERLIPIDEMIA .Diabetes mellitus    Plan:  Admit  Telemetry Neuro checks Monitor  O2 sats Hydrate Check TSH SSI O2, Trach Collar  Reconcile Meds.   Other plans as per orders.    CODE STATUS:      FULL CODE      Critical care time: 60 minutes.   Nicko Daher C 10/30/2011, 6:40 AM

## 2011-10-30 NOTE — Progress Notes (Addendum)
Brief Pharmacy note:  Lovenox Pt w/ BMI>30, but CrCl =35 ml/min- changed Lovenox to 60mg  daily (0.5 mg/kg) B.Chilton Si, PharmD [phone]

## 2011-10-30 NOTE — ED Notes (Signed)
Admitting MD awared that pt not able to go through MRI

## 2011-10-30 NOTE — ED Notes (Signed)
Admission MD in room 

## 2011-10-30 NOTE — ED Notes (Signed)
Attempted to call report, Receiving RN not available to take report at this moment.

## 2011-10-30 NOTE — Progress Notes (Addendum)
Patient seen and examined Came in because of hypoglycemia and confusion Poorly controlled diabetic with a hemoglobin A1c of 6.4 Probably has chronic kidney disease with a creatinine of 1.1 and 2011,  No gross focal neurologic deficits possible TIA given multiple risk factors we'll do an MRI PT OT eval likely DC home tomorrow

## 2011-10-30 NOTE — ED Notes (Signed)
Please call wife, Dietrich Samuelson at [phone] when pt gets a bed assignment

## 2011-10-30 NOTE — ED Notes (Signed)
Per family pt has been "acting funny" for the past 3 days. Today he could not remember how to take his keys out of the ignition of his car. Pt c/o right upper side pain, and feeling right. No drift or facial droop noted. Speech is approriate for pt. Alert and oriented x4.

## 2011-10-30 NOTE — ED Provider Notes (Addendum)
History     CSN: [id number]  Arrival date & time 10/29/11  2320   First MD Initiated Contact with Patient 10/30/11 0056      Chief Complaint  Patient presents with  . glucose check    . Blood Sugar Problem  . Altered Mental Status    (Consider location/radiation/quality/duration/timing/severity/associated sxs/prior treatment) HPI Comments: 51 year old male with a history of diabetes, COPD and head and neck cancer status post laryngectomy in 2005. He presents with altered mental status. According to his wife who provides most of the history the patient had driven himself to his physician today for an unknown reason, on the way home she noticed that he parked the car too far up the driveway and proceeded to go into the house grabbed a clean blanket out of the closet" into the yard and shake it. He has had some confusion, memory loss she states that over the last several weeks he has been dropping things more than usual. She states that he is also had a mild cough and increased shortness of breath. He does have a tracheotomy but does not use any supplement oxygen. She states that he has been drinking twice as much water as usual lately  Patient is a 51 y.o. male presenting with altered mental status. The history is provided by the patient. The history is limited by the condition of the patient (Altered mental status).  Altered Mental Status This is a new problem.    Past Medical History  Diagnosis Date  . Diabetes mellitus   . Hyperlipidemia   . Weight gain     After quitting smoking in 2005  . COPD (chronic obstructive pulmonary disease)     AB clinical dx; HFA 75% 02/28/10 > 90% Sept 21, 2011    Past Surgical History  Procedure Date  . 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 Knee arthroscopy     right    Family History  Problem Relation Age of Onset  . Emphysema Sister   . Prostate cancer Maternal Grandfather   . Clotting disorder Maternal Grandfather     . Atopy Neg Hx     History  Substance Use Topics  . Smoking status: Former Smoker -- 3.0 packs/day for 30 years    Quit date: 07/20/2003  . Smokeless tobacco: Not on file  . Alcohol Use: No      Review of Systems  Unable to perform ROS Psychiatric/Behavioral: Positive for altered mental status.    Allergies  Review of patient's allergies indicates no known allergies.  Home Medications   Current Outpatient Rx  Name Route Sig Dispense Refill  . ALBUTEROL SULFATE HFA 108 (90 BASE) MCG/ACT IN AERS Inhalation Inhale 2 puffs into the lungs every 6 (six) hours as needed.      . ALBUTEROL SULFATE (2.5 MG/3ML) 0.083% IN NEBU  1 vial in nebulizer four times daily 120 mL 0  . ALPRAZOLAM 1 MG PO TABS Oral Take 1 mg by mouth 4 (four) times daily as needed.      Marland Kitchen AMITRIPTYLINE HCL 150 MG PO TABS Oral Take 100 mg by mouth at bedtime.     . CYCLOBENZAPRINE HCL 10 MG PO TABS Oral Take 10 mg by mouth 3 (three) times daily.      . FENOFIBRATE 54 MG PO TABS Oral Take 54 mg by mouth daily.      Marland Kitchen GABAPENTIN 300 MG PO CAPS Oral Take 400 mg by mouth 3 (three)  times daily.     . INSULIN ASPART PROT & ASPART (70-30) 100 UNIT/ML [place] SUSP Subcutaneous Inject 71 Units into the skin See admin instructions. Patient takes 38 units in the morning and 33 units at night    . IPRATROPIUM BROMIDE 0.02 % IN SOLN  1 vial in nebulizer four times daily,   DX:  496 120 mL 0  . IPRATROPIUM-ALBUTEROL 0.5-2.5 (3) MG/3ML IN SOLN Nebulization Take 3 mLs by nebulization 4 (four) times daily. 360 mL 0    DX:  786.09  . LEVOTHYROXINE SODIUM 137 MCG PO TABS Oral Take 137 mcg by mouth daily.      Marland Kitchen LISINOPRIL 10 MG PO TABS Oral Take 10 mg by mouth daily.    . MORPHINE SULFATE ER 60 MG PO CP24 Oral Take 60 mg by mouth daily.      . OXYCODONE-ACETAMINOPHEN 7.5-325 MG PO TABS Oral Take 1 tablet by mouth every 6 (six) hours as needed.      Marland Kitchen PANTOPRAZOLE SODIUM 20 MG PO TBEC Oral Take 20 mg by mouth daily.    . QUETIAPINE  FUMARATE 400 MG PO TABS Oral Take 400 mg by mouth daily.      Marland Kitchen RIZATRIPTAN BENZOATE 10 MG PO TABS Oral Take 10 mg by mouth as needed. May repeat in 2 hours if needed     . ROSUVASTATIN CALCIUM 10 MG PO TABS Oral Take 10 mg by mouth daily.      . SERTRALINE HCL 50 MG PO TABS Oral Take 100 mg by mouth daily.     . SYMBICORT 160-4.5 MCG/ACT IN AERO  INHALE 2 PUFFS FIRST THING IN AM AND 2 PUFFS AGAIN IN PM ABOUT 12 HOURS LATER 10.2 g 5  . TAMSULOSIN HCL 0.4 MG PO CAPS Oral Take 0.4 mg by mouth daily.      . TRAZODONE HCL 100 MG PO TABS Oral Take 100 mg by mouth at bedtime.        BP 113/62  Pulse 101  Resp 14  SpO2 96%  Physical Exam  Nursing note and vitals reviewed. Constitutional: He appears well-developed and well-nourished. No distress.  HENT:  Head: Normocephalic and atraumatic.  Mouth/Throat: Oropharynx is clear and moist. No oropharyngeal exudate.  Eyes: Conjunctivae and EOM are normal. Pupils are equal, round, and reactive to light. Right eye exhibits no discharge. Left eye exhibits no discharge. No scleral icterus.  Neck: Normal range of motion. Neck supple. No JVD present. No thyromegaly present.       Tracheostomy open patent and without discharge  Cardiovascular: Normal rate, regular rhythm, normal heart sounds and intact distal pulses.  Exam reveals no gallop and no friction rub.   No murmur heard. Pulmonary/Chest: Effort normal and breath sounds normal. No respiratory distress. He has no wheezes. He has no rales.  Abdominal: Soft. Bowel sounds are normal. He exhibits no distension and no mass. There is no tenderness.  Musculoskeletal: Normal range of motion. He exhibits no edema and no tenderness.  Lymphadenopathy:    He has no cervical adenopathy.  Neurological: He is alert.       Mild tremor, speech clear, alert but unable to recall events from the day, is unsure why he is here  Skin: Skin is warm and dry. No rash noted. No erythema.  Psychiatric: He has a normal mood  and affect. His behavior is normal.    ED Course  Procedures (including critical care time)  ED ECG REPORT   Date: 10/30/2011  Rate: 103  Rhythm: sinus tachycardia  QRS Axis: left  Intervals: normal  ST/T Wave abnormalities: nonspecific T wave changes  Conduction Disutrbances:Incomplete right bundle branch block  Narrative Interpretation:   Old EKG Reviewed: Compared with 10/18/2009, now with incomplete right bundle branch block, nonspecific T waves now found   Labs Reviewed  GLUCOSE, CAPILLARY - Abnormal; Notable for the following:    Glucose-Capillary 130 (*)    All other components within normal limits  CBC - Abnormal; Notable for the following:    RBC 4.00 (*)    Hemoglobin 11.5 (*)    HCT 36.7 (*)    All other components within normal limits  COMPREHENSIVE METABOLIC PANEL - Abnormal; Notable for the following:    Sodium 131 (*)    Glucose, Bld 123 (*)    BUN 30 (*)    Creatinine, Ser 3.31 (*)    Alkaline Phosphatase 27 (*)    Total Bilirubin 0.2 (*)    GFR calc non Af Amer 20 (*)    GFR calc Af Amer 23 (*)    All other components within normal limits  URINALYSIS, ROUTINE W REFLEX MICROSCOPIC - Abnormal; Notable for the following:    APPearance CLOUDY (*)    Bilirubin Urine SMALL (*)    All other components within normal limits  URINE RAPID DRUG SCREEN (HOSP PERFORMED) - Abnormal; Notable for the following:    Opiates POSITIVE (*)    Benzodiazepines POSITIVE (*)    All other components within normal limits  CARDIAC PANEL(CRET KIN+CKTOT+MB+TROPI) - Abnormal; Notable for the following:    Total CK 527 (*)    All other components within normal limits  POCT I-STAT, CHEM 8 - Abnormal; Notable for the following:    BUN 35 (*)    Creatinine, Ser 3.40 (*)    Glucose, Bld 117 (*)    All other components within normal limits  DIFFERENTIAL  AMMONIA  LACTIC ACID, PLASMA  ETHANOL  PROCALCITONIN   Ct Head Wo Contrast  10/30/2011  *RADIOLOGY REPORT*  Clinical Data:  Severe headache.  Blurred vision.  CT HEAD WITHOUT CONTRAST  Technique:  Contiguous axial images were obtained from the base of the skull through the vertex without contrast.  Comparison: 10/19/2009  Findings: There is no evidence of intracranial hemorrhage, brain edema or other signs of acute infarction.  There is no evidence of intracranial mass lesion or mass effect.  No abnormal extra-axial fluid collections are identified.  Mild diffuse cerebral atrophy is again noted.  Ventricles are stable in size.  No other intracranial abnormality identified.  No evidence of skull fracture.  IMPRESSION:  1.  No acute intracranial abnormality. 2.  Stable cerebral atrophy.  Original Report Authenticated By: Danae Orleans, M.D.   Dg Chest Port 1 View  10/30/2011  *RADIOLOGY REPORT*  Clinical Data: Short of breath.  Cough.  Altered mental status.  PORTABLE CHEST - 1 VIEW  Comparison: 07/06/2010  Findings: Low lung volumes are seen.  There is mild atelectasis in both lung bases.  No evidence of pulmonary consolidation or edema. No evidence of pleural effusion.  Heart size is stable and within normal limits allowing for low lung volumes.  IMPRESSION: Low lung volumes with mild bibasilar atelectasis.  Original Report Authenticated By: Danae Orleans, M.D.     1. Renal failure   2. Encephalopathy       MDM  Altered mental status, borderline tachycardia with oxygen saturations around 90% on room air. With increased cough  will rule out pneumonia. The white male states that he has been having some right sided anterior lower chest wall pain for the last several days. We'll also check electrolytes to rule out other sources of altered mental status, urinalysis, ammonia, liver function.    Lab results show urinalysis which shows no signs of infection, normal cardiac enzymes, metabolic panel showing renal failure and mild hyponatremia, urine drug screen positive for opiates and benzodiazepines, white count which are  normal. Chest x-ray without infiltrate and a CT scan of the head without acute findings. Ammonia and lactic acid within normal levels. The studies were discussed with the [facility] hospitalist Dr. Lovell Sheehan who agrees with admission.      Vida Roller, MD 10/30/11 [id number]  Vida Roller, MD 10/30/11 [id number]

## 2011-10-31 ENCOUNTER — Inpatient Hospital Stay (HOSPITAL_COMMUNITY): Payer: Medicare Other

## 2011-10-31 DIAGNOSIS — R339 Retention of urine, unspecified: Secondary | ICD-10-CM

## 2011-10-31 LAB — GLUCOSE, CAPILLARY
Glucose-Capillary: 136 mg/dL — ABNORMAL HIGH (ref 70–99)
Glucose-Capillary: 149 mg/dL — ABNORMAL HIGH (ref 70–99)

## 2011-10-31 LAB — BASIC METABOLIC PANEL
BUN: 18 mg/dL (ref 6–23)
Chloride: 104 mEq/L (ref 96–112)
GFR calc Af Amer: 50 mL/min — ABNORMAL LOW (ref >90)
Glucose, Bld: 160 mg/dL — ABNORMAL HIGH (ref 70–99)
Potassium: 5.1 mEq/L (ref 3.5–5.1)

## 2011-10-31 LAB — HEPATIC FUNCTION PANEL
Albumin: 3.6 g/dL (ref 3.5–5.2)
Alkaline Phosphatase: 27 U/L — ABNORMAL LOW (ref 39–117)
Total Protein: 7.4 g/dL (ref 6.0–8.3)

## 2011-10-31 LAB — CBC
HCT: 38 % — ABNORMAL LOW (ref 39.0–52.0)
Hemoglobin: 11.8 g/dL — ABNORMAL LOW (ref 13.0–17.0)
MCH: 28.9 pg (ref 26.0–34.0)
MCHC: 31.1 g/dL (ref 30.0–36.0)

## 2011-10-31 LAB — LIPASE, BLOOD: Lipase: 19 U/L (ref 11–59)

## 2011-10-31 MED ORDER — METOCLOPRAMIDE HCL 5 MG/ML IJ SOLN
10.0000 mg | Freq: Once | INTRAMUSCULAR | Status: AC
  Administered 2011-10-31: 10 mg via INTRAVENOUS
  Filled 2011-10-31: qty 2

## 2011-10-31 MED ORDER — GABAPENTIN 300 MG PO CAPS: 300.0000 mg | ORAL_CAPSULE | Freq: Three times a day (TID) | ORAL | Status: DC

## 2011-10-31 MED ORDER — SUMATRIPTAN SUCCINATE 50 MG PO TABS
50.0000 mg | ORAL_TABLET | Freq: Once | ORAL | Status: AC
  Administered 2011-10-31: 50 mg via ORAL
  Filled 2011-10-31: qty 1

## 2011-10-31 MED ORDER — AMITRIPTYLINE HCL 150 MG PO TABS: 75.0000 mg | ORAL_TABLET | Freq: Every day | ORAL | Status: AC

## 2011-10-31 NOTE — Discharge Instructions (Addendum)
Continue Foley catheter at time of discharge Instructions to take care of the Foley catheter Urology followup in one week Hold lisinopril until seen by primary care provider  No driving or using heavy machinery until the patient has been weaned off his medications by primary care provider

## 2011-10-31 NOTE — Progress Notes (Signed)
Patient C/O migraine and nausea this AM, refused Oxy-IR for pain but Zofran was given and notified Dr Susie Cassette notified and orders written. Will continue to assess patient.

## 2011-10-31 NOTE — Discharge Summary (Signed)
Physician Discharge Summary  Joseph [name] MRN: [id number] DOB/AGE: November 03, 1960 51 y.o.  PCP: Rudi Heap, MD, MD   Admit date: 10/29/2011 Discharge date: 10/31/2011  Discharge Diagnoses:     *Urinary retention with incomplete bladder emptying Polypharmacy  HYPOTHYROIDISM  HYPERLIPIDEMIA  Encephalopathy acute, due to azotemia  ARF (acute renal failure)  Diabetes mellitus   Medication List  As of 10/31/2011 11:30 AM   STOP taking these medications         amitriptyline 150 MG tablet      lisinopril 10 MG tablet      traZODone 100 MG tablet         TAKE these medications         albuterol 108 (90 BASE) MCG/ACT inhaler   Commonly known as: PROVENTIL HFA;VENTOLIN HFA   Inhale 2 puffs into the lungs every 6 (six) hours as needed.      albuterol (2.5 MG/3ML) 0.083% nebulizer solution   Commonly known as: PROVENTIL   1 vial in nebulizer four times daily      ALPRAZolam 1 MG tablet   Commonly known as: XANAX   Take 1 mg by mouth 4 (four) times daily as needed.      cyclobenzaprine 10 MG tablet   Commonly known as: FLEXERIL   Take 10 mg by mouth 3 (three) times daily.      fenofibrate 54 MG tablet   Take 54 mg by mouth daily.      gabapentin 300 MG capsule   Commonly known as: NEURONTIN   Take 1 capsule (300 mg total) by mouth 3 (three) times daily.      insulin aspart protamine-insulin aspart (70-30) 100 UNIT/ML injection   Commonly known as: NOVOLOG 70/30   Inject 71 Units into the skin See admin instructions. Patient takes 38 units in the morning and 33 units at night      ipratropium 0.02 % nebulizer solution   Commonly known as: ATROVENT   1 vial in nebulizer four times daily,   DX:  496      ipratropium-albuterol 0.5-2.5 (3) MG/3ML Soln   Commonly known as: DUONEB   Take 3 mLs by nebulization 4 (four) times daily.      levothyroxine 137 MCG tablet   Commonly known as: SYNTHROID, LEVOTHROID   Take 137 mcg by mouth daily.      morphine 60  MG 24 hr capsule   Commonly known as: KADIAN   Take 60 mg by mouth daily.      oxyCODONE-acetaminophen 7.5-325 MG per tablet   Commonly known as: PERCOCET   Take 1 tablet by mouth every 6 (six) hours as needed.      pantoprazole 20 MG tablet   Commonly known as: PROTONIX   Take 20 mg by mouth daily.      QUEtiapine 400 MG tablet   Commonly known as: SEROQUEL   Take 400 mg by mouth daily.      rizatriptan 10 MG tablet   Commonly known as: MAXALT   Take 10 mg by mouth as needed. May repeat in 2 hours if needed      rosuvastatin 10 MG tablet   Commonly known as: CRESTOR   Take 10 mg by mouth daily.      sertraline 50 MG tablet   Commonly known as: ZOLOFT   Take 100 mg by mouth daily.      SYMBICORT 160-4.5 MCG/ACT inhaler   Generic drug: budesonide-formoterol   INHALE 2  PUFFS FIRST THING IN AM AND 2 PUFFS AGAIN IN PM ABOUT 12 HOURS LATER      Tamsulosin HCl 0.4 MG Caps   Commonly known as: FLOMAX   Take 0.4 mg by mouth daily.            Discharge Condition: Stable Disposition:    Consults: None Significant Diagnostic Studies: Ct Head Wo Contrast  10/30/2011  *RADIOLOGY REPORT*  Clinical Data: Severe headache.  Blurred vision.  CT HEAD WITHOUT CONTRAST  Technique:  Contiguous axial images were obtained from the base of the skull through the vertex without contrast.  Comparison: 10/19/2009  Findings: There is no evidence of intracranial hemorrhage, brain edema or other signs of acute infarction.  There is no evidence of intracranial mass lesion or mass effect.  No abnormal extra-axial fluid collections are identified.  Mild diffuse cerebral atrophy is again noted.  Ventricles are stable in size.  No other intracranial abnormality identified.  No evidence of skull fracture.  IMPRESSION:  1.  No acute intracranial abnormality. 2.  Stable cerebral atrophy.  Original Report Authenticated By: Danae Orleans, M.D.   Dg Chest Port 1 View  10/30/2011  *RADIOLOGY REPORT*  Clinical  Data: Short of breath.  Cough.  Altered mental status.  PORTABLE CHEST - 1 VIEW  Comparison: 07/06/2010  Findings: Low lung volumes are seen.  There is mild atelectasis in both lung bases.  No evidence of pulmonary consolidation or edema. No evidence of pleural effusion.  Heart size is stable and within normal limits allowing for low lung volumes.  IMPRESSION: Low lung volumes with mild bibasilar atelectasis.  Original Report Authenticated By: Danae Orleans, M.D.     Microbiology: No results found for this or any previous visit (from the past 240 hour(s)).   Labs: Results for orders placed during the hospital encounter of 10/29/11 (from the past 48 hour(s))  GLUCOSE, CAPILLARY     Status: Abnormal   Collection Time   10/30/11 12:05 AM      Component Value Range Comment   Glucose-Capillary 130 (*) 70 - 99 (mg/dL)    Comment 1 Notify RN     CBC     Status: Abnormal   Collection Time   10/30/11  1:27 AM      Component Value Range Comment   WBC 6.3  4.0 - 10.5 (K/uL)    RBC 4.00 (*) 4.22 - 5.81 (MIL/uL)    Hemoglobin 11.5 (*) 13.0 - 17.0 (g/dL)    HCT 16.1 (*) 09.6 - 52.0 (%)    MCV 91.8  78.0 - 100.0 (fL)    MCH 28.8  26.0 - 34.0 (pg)    MCHC 31.3  30.0 - 36.0 (g/dL)    RDW 04.5  40.9 - 81.1 (%)    Platelets 241  150 - 400 (K/uL)   DIFFERENTIAL     Status: Normal   Collection Time   10/30/11  1:27 AM      Component Value Range Comment   Neutrophils Relative 53  43 - 77 (%)    Neutro Abs 3.3  1.7 - 7.7 (K/uL)    Lymphocytes Relative 33  12 - 46 (%)    Lymphs Abs 2.1  0.7 - 4.0 (K/uL)    Monocytes Relative 10  3 - 12 (%)    Monocytes Absolute 0.6  0.1 - 1.0 (K/uL)    Eosinophils Relative 4  0 - 5 (%)    Eosinophils Absolute 0.3  0.0 - 0.7 (  K/uL)    Basophils Relative 1  0 - 1 (%)    Basophils Absolute 0.0  0.0 - 0.1 (K/uL)   AMMONIA     Status: Normal   Collection Time   10/30/11  1:27 AM      Component Value Range Comment   Ammonia 32  11 - 60 (umol/L)   COMPREHENSIVE METABOLIC  PANEL     Status: Abnormal   Collection Time   10/30/11  1:27 AM      Component Value Range Comment   Sodium 131 (*) 135 - 145 (mEq/L)    Potassium 4.5  3.5 - 5.1 (mEq/L)    Chloride 96  96 - 112 (mEq/L)    CO2 25  19 - 32 (mEq/L)    Glucose, Bld 123 (*) 70 - 99 (mg/dL)    BUN 30 (*) 6 - 23 (mg/dL)    Creatinine, Ser 1.61 (*) 0.50 - 1.35 (mg/dL)    Calcium 9.3  8.4 - 10.5 (mg/dL)    Total Protein 7.7  6.0 - 8.3 (g/dL)    Albumin 4.0  3.5 - 5.2 (g/dL)    AST 26  0 - 37 (U/L)    ALT 18  0 - 53 (U/L)    Alkaline Phosphatase 27 (*) 39 - 117 (U/L)    Total Bilirubin 0.2 (*) 0.3 - 1.2 (mg/dL)    GFR calc non Af Amer 20 (*) >90 (mL/min)    GFR calc Af Amer 23 (*) >90 (mL/min)   LACTIC ACID, PLASMA     Status: Normal   Collection Time   10/30/11  1:27 AM      Component Value Range Comment   Lactic Acid, Venous 0.8  0.5 - 2.2 (mmol/L)   PROCALCITONIN     Status: Normal   Collection Time   10/30/11  1:27 AM      Component Value Range Comment   Procalcitonin <0.10     ETHANOL     Status: Normal   Collection Time   10/30/11  1:27 AM      Component Value Range Comment   Alcohol, Ethyl (B) <11  0 - 11 (mg/dL)   CARDIAC PANEL(CRET KIN+CKTOT+MB+TROPI)     Status: Abnormal   Collection Time   10/30/11  1:27 AM      Component Value Range Comment   Total CK 527 (*) 7 - 232 (U/L)    CK, MB 3.9  0.3 - 4.0 (ng/mL)    Troponin I <0.30  <0.30 (ng/mL)    Relative Index 0.7  0.0 - 2.5    HEMOGLOBIN A1C     Status: Abnormal   Collection Time   10/30/11  1:30 AM      Component Value Range Comment   Hemoglobin A1C 6.4 (*) <5.7 (%)    Mean Plasma Glucose 137 (*) <117 (mg/dL)   POCT I-STAT, CHEM 8     Status: Abnormal   Collection Time   10/30/11  1:57 AM      Component Value Range Comment   Sodium 135  135 - 145 (mEq/L)    Potassium 5.0  3.5 - 5.1 (mEq/L)    Chloride 106  96 - 112 (mEq/L)    BUN 35 (*) 6 - 23 (mg/dL)    Creatinine, Ser 0.96 (*) 0.50 - 1.35 (mg/dL)    Glucose, Bld 045 (*) 70 - 99  (mg/dL)    Calcium, Ion 4.09  1.12 - 1.32 (mmol/L)    TCO2 24  0 - 100 (mmol/L)    Hemoglobin 13.9  13.0 - 17.0 (g/dL)    HCT 81.1  91.4 - 78.2 (%)   URINALYSIS, ROUTINE W REFLEX MICROSCOPIC     Status: Abnormal   Collection Time   10/30/11  2:13 AM      Component Value Range Comment   Color, Urine YELLOW  YELLOW     APPearance CLOUDY (*) CLEAR     Specific Gravity, Urine 1.022  1.005 - 1.030     pH 6.0  5.0 - 8.0     Glucose, UA NEGATIVE  NEGATIVE (mg/dL)    Hgb urine dipstick NEGATIVE  NEGATIVE     Bilirubin Urine SMALL (*) NEGATIVE     Ketones, ur NEGATIVE  NEGATIVE (mg/dL)    Protein, ur NEGATIVE  NEGATIVE (mg/dL)    Urobilinogen, UA 0.2  0.0 - 1.0 (mg/dL)    Nitrite NEGATIVE  NEGATIVE     Leukocytes, UA NEGATIVE  NEGATIVE  MICROSCOPIC NOT DONE ON URINES WITH NEGATIVE PROTEIN, BLOOD, LEUKOCYTES, NITRITE, OR GLUCOSE <1000 mg/dL.  URINE RAPID DRUG SCREEN (HOSP PERFORMED)     Status: Abnormal   Collection Time   10/30/11  2:14 AM      Component Value Range Comment   Opiates POSITIVE (*) NONE DETECTED     Cocaine NONE DETECTED  NONE DETECTED     Benzodiazepines POSITIVE (*) NONE DETECTED     Amphetamines NONE DETECTED  NONE DETECTED     Tetrahydrocannabinol NONE DETECTED  NONE DETECTED     Barbiturates NONE DETECTED  NONE DETECTED    GLUCOSE, CAPILLARY     Status: Abnormal   Collection Time   10/30/11  8:26 AM      Component Value Range Comment   Glucose-Capillary 117 (*) 70 - 99 (mg/dL)    Comment 1 Documented in Chart      Comment 2 Notify RN     GLUCOSE, CAPILLARY     Status: Abnormal   Collection Time   10/30/11  8:56 AM      Component Value Range Comment   Glucose-Capillary 129 (*) 70 - 99 (mg/dL)   CARDIAC PANEL(CRET KIN+CKTOT+MB+TROPI)     Status: Abnormal   Collection Time   10/30/11 12:10 PM      Component Value Range Comment   Total CK 797 (*) 7 - 232 (U/L)    CK, MB 10.8 (*) 0.3 - 4.0 (ng/mL)    Troponin I <0.30  <0.30 (ng/mL)    Relative Index 1.4  0.0 - 2.5     GLUCOSE, CAPILLARY     Status: Abnormal   Collection Time   10/30/11  1:38 PM      Component Value Range Comment   Glucose-Capillary 135 (*) 70 - 99 (mg/dL)   GLUCOSE, CAPILLARY     Status: Abnormal   Collection Time   10/30/11  2:31 PM      Component Value Range Comment   Glucose-Capillary 120 (*) 70 - 99 (mg/dL)   CARDIAC PANEL(CRET KIN+CKTOT+MB+TROPI)     Status: Abnormal   Collection Time   10/30/11  5:03 PM      Component Value Range Comment   Total CK 910 (*) 7 - 232 (U/L)    CK, MB 11.5 (*) 0.3 - 4.0 (ng/mL) CRITICAL VALUE NOTED.  VALUE IS CONSISTENT WITH PREVIOUSLY REPORTED AND CALLED VALUE.   Troponin I <0.30  <0.30 (ng/mL)    Relative Index 1.3  0.0 - 2.5    GLUCOSE,  CAPILLARY     Status: Abnormal   Collection Time   10/30/11  5:53 PM      Component Value Range Comment   Glucose-Capillary 136 (*) 70 - 99 (mg/dL)   GLUCOSE, CAPILLARY     Status: Abnormal   Collection Time   10/30/11  9:47 PM      Component Value Range Comment   Glucose-Capillary 142 (*) 70 - 99 (mg/dL)   CARDIAC PANEL(CRET KIN+CKTOT+MB+TROPI)     Status: Abnormal   Collection Time   10/30/11 11:56 PM      Component Value Range Comment   Total CK 748 (*) 7 - 232 (U/L)    CK, MB 9.1 (*) 0.3 - 4.0 (ng/mL)    Troponin I <0.30  <0.30 (ng/mL)    Relative Index 1.2  0.0 - 2.5    BASIC METABOLIC PANEL     Status: Abnormal   Collection Time   10/31/11  5:15 AM      Component Value Range Comment   Sodium 136  135 - 145 (mEq/L) REPEATED TO VERIFY   Potassium 5.1  3.5 - 5.1 (mEq/L) REPEATED TO VERIFY   Chloride 104  96 - 112 (mEq/L) REPEATED TO VERIFY   CO2 26  19 - 32 (mEq/L) REPEATED TO VERIFY   Glucose, Bld 160 (*) 70 - 99 (mg/dL) REPEATED TO VERIFY   BUN 18  6 - 23 (mg/dL)    Creatinine, Ser 7.84 (*) 0.50 - 1.35 (mg/dL)    Calcium 8.8  8.4 - 10.5 (mg/dL) REPEATED TO VERIFY   GFR calc non Af Amer 43 (*) >90 (mL/min)    GFR calc Af Amer 50 (*) >90 (mL/min)   CBC     Status: Abnormal   Collection Time    10/31/11  5:15 AM      Component Value Range Comment   WBC 5.0  4.0 - 10.5 (K/uL)    RBC 4.08 (*) 4.22 - 5.81 (MIL/uL)    Hemoglobin 11.8 (*) 13.0 - 17.0 (g/dL)    HCT 69.6 (*) 29.5 - 52.0 (%)    MCV 93.1  78.0 - 100.0 (fL)    MCH 28.9  26.0 - 34.0 (pg)    MCHC 31.1  30.0 - 36.0 (g/dL)    RDW 28.4  13.2 - 44.0 (%)    Platelets 218  150 - 400 (K/uL)   GLUCOSE, CAPILLARY     Status: Abnormal   Collection Time   10/31/11  7:31 AM      Component Value Range Comment   Glucose-Capillary 149 (*) 70 - 99 (mg/dL)   GLUCOSE, CAPILLARY     Status: Abnormal   Collection Time   10/31/11 11:12 AM      Component Value Range Comment   Glucose-Capillary 145 (*) 70 - 99 (mg/dL)      HPI 51 year old male with a history of diabetes, COPD and head and neck cancer status post laryngectomy in 2005. He presents with altered mental status. According to his wife who provides most of the history the patient has had some confusion, memory loss she states that over the last several weeks he has been dropping things more than usual. She states that he is also had a mild cough and increased shortness of breath. He does have a tracheotomy but does not use any supplement oxygen. She states that he has been drinking twice as much water as usual lately  Apparently has also had some hypoglycemia.   HOSPITAL COURSE: #1 altered  mental status likely secondary to azotemia in the setting of his acute renal failure. No evidence of infection. UA was negative chest x-ray was negative. Patient's older requirements have remained stable. An MRI of the brain was attempted to rule out CVA however patient could not tolerate it. Patient is also on multiple medications such as opiates benzodiazepines, muscle relaxants, antidepressants. I have discontinued his trazodone and decreased amitriptyline to 75 mg. I have decreased his gabapentin. We need to minimize his outpatient sedating medications.  #2 urinary retention patient had 900 cc upon  admission. A Foley catheter was placed with significant improvement in his acute renal failure. Creatinine is now 1.79. He will go home with a Foley catheter. Foley teaching will be done. He is already on Flomax at home.  #3 diabetes control at this point  #4 abdominal pain patient was having abdominal pain pain of unclear etiology, his liver function tests and lipase have been negative. An abdominal KUB is pending. The patient has also been nauseous because of a migraine headache, treated with Maxalt. This is exacerbating his abdominal pain. The patient's labs are negative and KUB is negative no further workup is being done for this. He has not had any diarrhea.   #5 hypertension patient's lisinopril has been put on hold, given his renal failure. This needs to be resumed after a BMP is done at the PCPs office  #6 acute renal failure patient was in retention which is the reason for his acute kidney injury. He will go home with a Foley catheter and follow up with urology  Discharge Exam:  Blood pressure 147/88, pulse 99, temperature 98.2 F (36.8 C), temperature source Oral, resp. rate 18, height 6\' 2"  (1.88 m), weight 117.8 kg (259 lb 11.2 oz), SpO2 97.00%.  HENT:  Head: Normocephalic and atraumatic.  Mouth/Throat: Oropharynx is clear and moist. No oropharyngeal exudate.  Eyes: Conjunctivae and EOM are normal. Pupils are equal, round, and reactive to light. Right eye exhibits no discharge. Left eye exhibits no discharge. No scleral icterus.  Neck: Normal range of motion. Neck supple. No JVD present. No thyromegaly present.  Tracheostomy open patent and without discharge  Cardiovascular: Normal rate, regular rhythm, normal heart sounds and intact distal pulses. Exam reveals no gallop and no friction rub.  No murmur heard.  Pulmonary/Chest: Effort normal and breath sounds normal. No respiratory distress. He has no wheezes. He has no rales.  Abdominal: Soft. Bowel sounds are normal. He exhibits  no distension and no mass. There is no tenderness.  Musculoskeletal: Normal range of motion. He exhibits no edema and no tenderness.  Lymphadenopathy:  He has no cervical adenopathy.  Neurological: He is alert.  Mild tremor, speech clear, alert but unable to recall events from the day, is unsure why he is here  Skin: Skin is warm and dry. No rash noted. No erythema.  Psychiatric: He has a normal mood and affect. His behavior is normal.      Discharge Orders    Future Orders Please Complete By Expires   Diet - low sodium heart healthy      Increase activity slowly      Call MD for:  temperature >100.4      Call MD for:  persistant nausea and vomiting      Call MD for:  severe uncontrolled pain      Call MD for:  difficulty breathing, headache or visual disturbances         Follow-up Information  Follow up with Rudi Heap, MD. (BMP in one week)       Follow up with Urology. (Urinary retention)          Signed: Richarda Overlie 10/31/2011, 11:30 AM

## 2011-10-31 NOTE — Progress Notes (Addendum)
Patient discharged home with wife. Discharge instructions given and explained to patient/wife and they verbalized understand. Patient denies any distress; skin intact no wound. Transported to the car via wheelchair by staff and accompanied home by wife. Patient discharged home with foley per MD order and will F/U with urologist out patient.

## 2011-10-31 NOTE — Evaluation (Signed)
Physical Therapy Evaluation Patient Details Name: Joseph [name] MRN: [id number] DOB: Oct 31, 1960 Today's Date: 10/31/2011  Problem List:  Patient Active Problem List  Diagnoses  . HYPOTHYROIDISM  . HYPERLIPIDEMIA  . RHEUMATOID ARTHRITIS  . SLEEP APNEA  . HEADACHE, CHRONIC  . DYSPNEA  . Abnormal EKG  . Encephalopathy acute  . ARF (acute renal failure)  . Diabetes mellitus  . Urinary retention with incomplete bladder emptying    Past Medical History:  Past Medical History  Diagnosis Date  . Diabetes mellitus   . Hyperlipidemia   . Weight gain     After quitting smoking in 2005  . COPD (chronic obstructive pulmonary disease)     AB clinical dx; HFA 75% 02/28/10 > 90% Sept 21, 2011   Past Surgical History:  Past Surgical History  Procedure Date  . 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 Knee arthroscopy     right    PT Assessment/Plan/Recommendation PT Assessment Clinical Impression Statement: Pt presents with urinary retention/incomplete bladder emptying.  Pt tolerated ambulation very well and stated that it felt great to be up out of bed.  Performed flight of stairs without difficulty.  Pt will not require any follow up therapy in acute venue or at home.  Ready for D/C.  PT Recommendation/Assessment: Patent does not need any further PT services No Skilled PT: All education completed;Patient is supervision for all activity/mobility;Patient is modified independent with all activity/mobility PT Recommendation Follow Up Recommendations: No PT follow up Equipment Recommended: None recommended by PT PT Goals     PT Evaluation Precautions/Restrictions  Precautions Precautions: None Restrictions Weight Bearing Restrictions: No Prior Functioning  Home Living Lives With: Spouse Available Help at Discharge: Family Type of Home: Mobile home Home Access: Stairs to enter Entrance Stairs-Number of Steps: 3 Entrance Stairs-Rails: Right;Left (one part  has 2 rails) Home Layout: One level Home Adaptive Equipment: Straight cane;Crutches Additional Comments: has a walking stick Prior Function Level of Independence: Independent Able to Take Stairs?: Yes Vocation: Retired Financial risk analyst Arousal/Alertness: Awake/alert Overall Cognitive Status: Appears within functional limits for tasks assessed Orientation Level: Oriented X4 Sensation/Coordination Sensation Light Touch: Appears Intact Coordination Gross Motor Movements are Fluid and Coordinated: Yes Extremity Assessment RLE Assessment RLE Assessment: Within Functional Limits LLE Assessment LLE Assessment: Within Functional Limits Mobility (including Balance) Bed Mobility Bed Mobility: Yes Supine to Sit: 6: Modified independent (Device/Increase time) Transfers Transfers: Yes Sit to Stand: 5: Supervision;From elevated surface;With upper extremity assist;From bed Sit to Stand Details (indicate cue type and reason): Supervision for safety.  Stand to Sit: 6: Modified independent (Device/Increase time) Ambulation/Gait Ambulation/Gait: Yes Ambulation/Gait Assistance: 5: Supervision Ambulation/Gait Assistance Details (indicate cue type and reason): Supervision for safety.  Ambulation Distance (Feet): 800 Feet Assistive device: None Gait Pattern: Within Functional Limits Gait velocity: WFL Stairs: Yes Stairs Assistance: 5: Supervision Stairs Assistance Details (indicate cue type and reason): Supervision for safety.  Stair Management Technique: One rail Right Number of Stairs: 12  Height of Stairs: 6     Exercise    End of Session PT - End of Session Equipment Utilized During Treatment: Gait belt Activity Tolerance: Patient tolerated treatment well Patient left: in chair;with call bell in reach Nurse Communication: Mobility status for transfers;Mobility status for ambulation General Behavior During Session: [facility] for tasks performed Cognition: [facility] for tasks  performed Patient Class: Inpatient Page, Meribeth Mattes 10/31/2011, 5:25 PM

## 2011-10-31 NOTE — Progress Notes (Signed)
Educated patient/wife on foley and leg bag care/management at home and they verbalized understanding with returned demonstration from wife. Continue to F/U with discharge plan.

## 2011-10-31 NOTE — Progress Notes (Signed)
After physical therapist assessed patient but OP did not, Dr Susie Cassette was notified and stated to proceed with ordered discharge and D/C patient home. Waiting on patient's wife for discharge.

## 2011-11-04 DIAGNOSIS — M7710 Lateral epicondylitis, unspecified elbow: Secondary | ICD-10-CM | POA: Diagnosis not present

## 2011-11-12 DIAGNOSIS — R339 Retention of urine, unspecified: Secondary | ICD-10-CM | POA: Diagnosis not present

## 2011-12-02 DIAGNOSIS — G894 Chronic pain syndrome: Secondary | ICD-10-CM | POA: Diagnosis not present

## 2011-12-02 DIAGNOSIS — M7710 Lateral epicondylitis, unspecified elbow: Secondary | ICD-10-CM | POA: Diagnosis not present

## 2011-12-02 DIAGNOSIS — M47817 Spondylosis without myelopathy or radiculopathy, lumbosacral region: Secondary | ICD-10-CM | POA: Diagnosis not present

## 2011-12-17 DIAGNOSIS — L0390 Cellulitis, unspecified: Secondary | ICD-10-CM | POA: Diagnosis not present

## 2011-12-17 DIAGNOSIS — M7710 Lateral epicondylitis, unspecified elbow: Secondary | ICD-10-CM | POA: Diagnosis not present

## 2011-12-17 DIAGNOSIS — L0291 Cutaneous abscess, unspecified: Secondary | ICD-10-CM | POA: Diagnosis not present

## 2011-12-23 DIAGNOSIS — E785 Hyperlipidemia, unspecified: Secondary | ICD-10-CM | POA: Diagnosis not present

## 2011-12-23 DIAGNOSIS — E039 Hypothyroidism, unspecified: Secondary | ICD-10-CM | POA: Diagnosis not present

## 2011-12-23 DIAGNOSIS — E119 Type 2 diabetes mellitus without complications: Secondary | ICD-10-CM | POA: Diagnosis not present

## 2011-12-27 DIAGNOSIS — J449 Chronic obstructive pulmonary disease, unspecified: Secondary | ICD-10-CM | POA: Diagnosis not present

## 2011-12-29 DIAGNOSIS — M47817 Spondylosis without myelopathy or radiculopathy, lumbosacral region: Secondary | ICD-10-CM | POA: Diagnosis not present

## 2011-12-29 DIAGNOSIS — M7710 Lateral epicondylitis, unspecified elbow: Secondary | ICD-10-CM | POA: Diagnosis not present

## 2011-12-29 DIAGNOSIS — G894 Chronic pain syndrome: Secondary | ICD-10-CM | POA: Diagnosis not present

## 2012-01-06 DIAGNOSIS — F0630 Mood disorder due to known physiological condition, unspecified: Secondary | ICD-10-CM | POA: Diagnosis not present

## 2012-01-28 DIAGNOSIS — G894 Chronic pain syndrome: Secondary | ICD-10-CM | POA: Diagnosis not present

## 2012-01-28 DIAGNOSIS — E559 Vitamin D deficiency, unspecified: Secondary | ICD-10-CM | POA: Diagnosis not present

## 2012-01-28 DIAGNOSIS — IMO0001 Reserved for inherently not codable concepts without codable children: Secondary | ICD-10-CM | POA: Diagnosis not present

## 2012-01-28 DIAGNOSIS — M47817 Spondylosis without myelopathy or radiculopathy, lumbosacral region: Secondary | ICD-10-CM | POA: Diagnosis not present

## 2012-01-28 DIAGNOSIS — M7710 Lateral epicondylitis, unspecified elbow: Secondary | ICD-10-CM | POA: Diagnosis not present

## 2012-02-10 DIAGNOSIS — R339 Retention of urine, unspecified: Secondary | ICD-10-CM | POA: Diagnosis not present

## 2012-02-24 DIAGNOSIS — M47817 Spondylosis without myelopathy or radiculopathy, lumbosacral region: Secondary | ICD-10-CM | POA: Diagnosis not present

## 2012-03-27 DIAGNOSIS — M47817 Spondylosis without myelopathy or radiculopathy, lumbosacral region: Secondary | ICD-10-CM | POA: Diagnosis not present

## 2012-04-05 DIAGNOSIS — F3289 Other specified depressive episodes: Secondary | ICD-10-CM | POA: Diagnosis not present

## 2012-04-05 DIAGNOSIS — F329 Major depressive disorder, single episode, unspecified: Secondary | ICD-10-CM | POA: Diagnosis not present

## 2012-04-18 ENCOUNTER — Telehealth: Payer: Self-pay | Admitting: Internal Medicine

## 2012-04-18 DIAGNOSIS — M722 Plantar fascial fibromatosis: Secondary | ICD-10-CM | POA: Diagnosis not present

## 2012-04-18 DIAGNOSIS — G894 Chronic pain syndrome: Secondary | ICD-10-CM | POA: Diagnosis not present

## 2012-04-18 DIAGNOSIS — M47817 Spondylosis without myelopathy or radiculopathy, lumbosacral region: Secondary | ICD-10-CM | POA: Diagnosis not present

## 2012-04-18 NOTE — Telephone Encounter (Signed)
Called pt x 3 to make next ov per recall.  [name] phone # has been disconnected.  Mailed recall letter to pt's home address on 03/29/12. Joseph [name]

## 2012-04-28 DIAGNOSIS — IMO0001 Reserved for inherently not codable concepts without codable children: Secondary | ICD-10-CM | POA: Diagnosis not present

## 2012-04-28 DIAGNOSIS — E785 Hyperlipidemia, unspecified: Secondary | ICD-10-CM | POA: Diagnosis not present

## 2012-04-28 DIAGNOSIS — E860 Dehydration: Secondary | ICD-10-CM | POA: Diagnosis not present

## 2012-04-28 DIAGNOSIS — Z23 Encounter for immunization: Secondary | ICD-10-CM | POA: Diagnosis not present

## 2012-05-17 DIAGNOSIS — E1142 Type 2 diabetes mellitus with diabetic polyneuropathy: Secondary | ICD-10-CM | POA: Diagnosis not present

## 2012-05-17 DIAGNOSIS — E119 Type 2 diabetes mellitus without complications: Secondary | ICD-10-CM | POA: Diagnosis not present

## 2012-05-17 DIAGNOSIS — IMO0002 Reserved for concepts with insufficient information to code with codable children: Secondary | ICD-10-CM | POA: Diagnosis not present

## 2012-05-17 DIAGNOSIS — G894 Chronic pain syndrome: Secondary | ICD-10-CM | POA: Diagnosis not present

## 2012-06-05 ENCOUNTER — Other Ambulatory Visit (HOSPITAL_COMMUNITY): Payer: Self-pay | Admitting: Physical Medicine and Rehabilitation

## 2012-06-05 ENCOUNTER — Ambulatory Visit (HOSPITAL_COMMUNITY)
Admission: RE | Admit: 2012-06-05 | Discharge: 2012-06-05 | Disposition: A | Discharge status: Home / Self Care | Payer: Medicare Other | Source: Ambulatory Visit | Attending: Physical Medicine and Rehabilitation | Admitting: Physical Medicine and Rehabilitation

## 2012-06-05 DIAGNOSIS — T148XXA Other injury of unspecified body region, initial encounter: Secondary | ICD-10-CM

## 2012-06-05 DIAGNOSIS — Z8589 Personal history of malignant neoplasm of other organs and systems: Secondary | ICD-10-CM | POA: Diagnosis not present

## 2012-06-05 DIAGNOSIS — Q766 Other congenital malformations of ribs: Secondary | ICD-10-CM | POA: Insufficient documentation

## 2012-06-05 DIAGNOSIS — M47817 Spondylosis without myelopathy or radiculopathy, lumbosacral region: Secondary | ICD-10-CM | POA: Diagnosis not present

## 2012-06-05 DIAGNOSIS — Q767 Congenital malformation of sternum: Secondary | ICD-10-CM | POA: Diagnosis not present

## 2012-06-12 DIAGNOSIS — M7710 Lateral epicondylitis, unspecified elbow: Secondary | ICD-10-CM | POA: Diagnosis not present

## 2012-06-12 DIAGNOSIS — G894 Chronic pain syndrome: Secondary | ICD-10-CM | POA: Diagnosis not present

## 2012-06-12 DIAGNOSIS — IMO0002 Reserved for concepts with insufficient information to code with codable children: Secondary | ICD-10-CM | POA: Diagnosis not present

## 2012-07-10 DIAGNOSIS — G894 Chronic pain syndrome: Secondary | ICD-10-CM | POA: Diagnosis not present

## 2012-07-10 DIAGNOSIS — M62838 Other muscle spasm: Secondary | ICD-10-CM | POA: Diagnosis not present

## 2012-07-10 DIAGNOSIS — M47817 Spondylosis without myelopathy or radiculopathy, lumbosacral region: Secondary | ICD-10-CM | POA: Diagnosis not present

## 2012-07-10 DIAGNOSIS — IMO0002 Reserved for concepts with insufficient information to code with codable children: Secondary | ICD-10-CM | POA: Diagnosis not present

## 2012-07-26 DIAGNOSIS — F3289 Other specified depressive episodes: Secondary | ICD-10-CM | POA: Diagnosis not present

## 2012-07-26 DIAGNOSIS — F329 Major depressive disorder, single episode, unspecified: Secondary | ICD-10-CM | POA: Diagnosis not present

## 2012-08-09 DIAGNOSIS — M47817 Spondylosis without myelopathy or radiculopathy, lumbosacral region: Secondary | ICD-10-CM | POA: Diagnosis not present

## 2012-08-09 DIAGNOSIS — G894 Chronic pain syndrome: Secondary | ICD-10-CM | POA: Diagnosis not present

## 2012-08-09 DIAGNOSIS — E119 Type 2 diabetes mellitus without complications: Secondary | ICD-10-CM | POA: Diagnosis not present

## 2012-08-09 DIAGNOSIS — IMO0002 Reserved for concepts with insufficient information to code with codable children: Secondary | ICD-10-CM | POA: Diagnosis not present

## 2012-08-25 DIAGNOSIS — R5383 Other fatigue: Secondary | ICD-10-CM | POA: Diagnosis not present

## 2012-08-25 DIAGNOSIS — J811 Chronic pulmonary edema: Secondary | ICD-10-CM | POA: Diagnosis not present

## 2012-08-25 DIAGNOSIS — R5381 Other malaise: Secondary | ICD-10-CM | POA: Diagnosis not present

## 2012-08-25 DIAGNOSIS — R42 Dizziness and giddiness: Secondary | ICD-10-CM | POA: Diagnosis not present

## 2012-08-28 DIAGNOSIS — R42 Dizziness and giddiness: Secondary | ICD-10-CM | POA: Diagnosis not present

## 2012-08-28 DIAGNOSIS — M545 Low back pain, unspecified: Secondary | ICD-10-CM | POA: Diagnosis not present

## 2012-09-11 DIAGNOSIS — IMO0002 Reserved for concepts with insufficient information to code with codable children: Secondary | ICD-10-CM | POA: Diagnosis not present

## 2012-09-12 DIAGNOSIS — R3911 Hesitancy of micturition: Secondary | ICD-10-CM | POA: Diagnosis not present

## 2012-09-12 DIAGNOSIS — N289 Disorder of kidney and ureter, unspecified: Secondary | ICD-10-CM | POA: Diagnosis not present

## 2012-09-12 DIAGNOSIS — N529 Male erectile dysfunction, unspecified: Secondary | ICD-10-CM | POA: Diagnosis not present

## 2012-09-21 DIAGNOSIS — IMO0001 Reserved for inherently not codable concepts without codable children: Secondary | ICD-10-CM | POA: Diagnosis not present

## 2012-09-27 DIAGNOSIS — IMO0001 Reserved for inherently not codable concepts without codable children: Secondary | ICD-10-CM | POA: Diagnosis not present

## 2012-09-29 ENCOUNTER — Other Ambulatory Visit: Payer: Self-pay | Admitting: Family Medicine

## 2012-09-29 DIAGNOSIS — E785 Hyperlipidemia, unspecified: Secondary | ICD-10-CM | POA: Diagnosis not present

## 2012-09-29 DIAGNOSIS — M25529 Pain in unspecified elbow: Secondary | ICD-10-CM | POA: Diagnosis not present

## 2012-09-29 DIAGNOSIS — IMO0001 Reserved for inherently not codable concepts without codable children: Secondary | ICD-10-CM | POA: Diagnosis not present

## 2012-09-29 DIAGNOSIS — E119 Type 2 diabetes mellitus without complications: Secondary | ICD-10-CM | POA: Diagnosis not present

## 2012-10-03 LAB — NMR LIPOPROFILE WITH LIPIDS
Cholesterol, Total: 142 mg/dL (ref <200)
HDL Particle Number: 41.7 umol/L (ref >=30.5)
HDL Size: 8.3 nm — ABNORMAL LOW (ref >=9.2)
HDL-C: 48 mg/dL (ref >=40)
LDL (calc): 66 mg/dL (ref <100)
LDL Particle Number: 1010 nmol/L — ABNORMAL HIGH (ref <1000)
LDL Size: 20.4 nm — ABNORMAL LOW (ref >20.5)
LP-IR Score: 75 — ABNORMAL HIGH (ref <=45)
Large HDL-P: 1.4 umol/L — ABNORMAL LOW (ref >=4.8)
Large VLDL-P: 5.3 nmol/L — ABNORMAL HIGH (ref <=2.7)
Small LDL Particle Number: 551 nmol/L — ABNORMAL HIGH (ref <=527)
Triglycerides: 140 mg/dL (ref <150)
VLDL Size: 48.2 nm — ABNORMAL HIGH (ref >=46.6)

## 2012-10-06 DIAGNOSIS — M7710 Lateral epicondylitis, unspecified elbow: Secondary | ICD-10-CM | POA: Diagnosis not present

## 2012-10-06 DIAGNOSIS — G894 Chronic pain syndrome: Secondary | ICD-10-CM | POA: Diagnosis not present

## 2012-10-06 DIAGNOSIS — IMO0002 Reserved for concepts with insufficient information to code with codable children: Secondary | ICD-10-CM | POA: Diagnosis not present

## 2012-10-23 DIAGNOSIS — Z125 Encounter for screening for malignant neoplasm of prostate: Secondary | ICD-10-CM | POA: Diagnosis not present

## 2012-10-23 DIAGNOSIS — R3916 Straining to void: Secondary | ICD-10-CM | POA: Diagnosis not present

## 2012-10-23 DIAGNOSIS — N289 Disorder of kidney and ureter, unspecified: Secondary | ICD-10-CM | POA: Diagnosis not present

## 2012-10-23 DIAGNOSIS — R3911 Hesitancy of micturition: Secondary | ICD-10-CM | POA: Diagnosis not present

## 2012-10-23 DIAGNOSIS — N529 Male erectile dysfunction, unspecified: Secondary | ICD-10-CM | POA: Diagnosis not present

## 2012-10-23 DIAGNOSIS — N319 Neuromuscular dysfunction of bladder, unspecified: Secondary | ICD-10-CM | POA: Diagnosis not present

## 2012-10-25 ENCOUNTER — Other Ambulatory Visit: Payer: Self-pay | Admitting: *Deleted

## 2012-10-25 MED ORDER — PANTOPRAZOLE SODIUM 20 MG PO TBEC: 20.0000 mg | DELAYED_RELEASE_TABLET | Freq: Every day | ORAL | Status: DC

## 2012-11-02 ENCOUNTER — Ambulatory Visit: Payer: Self-pay

## 2012-11-09 ENCOUNTER — Ambulatory Visit: Payer: Self-pay

## 2012-11-09 DIAGNOSIS — M7710 Lateral epicondylitis, unspecified elbow: Secondary | ICD-10-CM | POA: Diagnosis not present

## 2012-11-09 DIAGNOSIS — Z79899 Other long term (current) drug therapy: Secondary | ICD-10-CM | POA: Diagnosis not present

## 2012-11-09 DIAGNOSIS — G894 Chronic pain syndrome: Secondary | ICD-10-CM | POA: Diagnosis not present

## 2012-11-09 DIAGNOSIS — M47817 Spondylosis without myelopathy or radiculopathy, lumbosacral region: Secondary | ICD-10-CM | POA: Diagnosis not present

## 2012-11-10 ENCOUNTER — Other Ambulatory Visit: Payer: Self-pay | Admitting: Internal Medicine

## 2012-11-15 DIAGNOSIS — F3289 Other specified depressive episodes: Secondary | ICD-10-CM | POA: Diagnosis not present

## 2012-11-15 DIAGNOSIS — F329 Major depressive disorder, single episode, unspecified: Secondary | ICD-10-CM | POA: Diagnosis not present

## 2012-11-20 ENCOUNTER — Other Ambulatory Visit: Payer: Self-pay | Admitting: Family Medicine

## 2012-11-23 ENCOUNTER — Ambulatory Visit: Payer: Self-pay

## 2012-11-28 ENCOUNTER — Other Ambulatory Visit: Payer: Self-pay | Admitting: Family Medicine

## 2012-12-04 ENCOUNTER — Other Ambulatory Visit: Payer: Self-pay

## 2012-12-04 MED ORDER — AMOXICILLIN-POT CLAVULANATE 875-125 MG PO TABS: 1.0000 | ORAL_TABLET | Freq: Two times a day (BID) | ORAL | Status: DC

## 2012-12-04 NOTE — Telephone Encounter (Signed)
Last seen 09/29/12

## 2012-12-07 DIAGNOSIS — G894 Chronic pain syndrome: Secondary | ICD-10-CM | POA: Diagnosis not present

## 2012-12-07 DIAGNOSIS — M47817 Spondylosis without myelopathy or radiculopathy, lumbosacral region: Secondary | ICD-10-CM | POA: Diagnosis not present

## 2012-12-07 DIAGNOSIS — M7710 Lateral epicondylitis, unspecified elbow: Secondary | ICD-10-CM | POA: Diagnosis not present

## 2013-01-02 ENCOUNTER — Ambulatory Visit: Payer: Self-pay | Admitting: Family Medicine

## 2013-01-05 ENCOUNTER — Other Ambulatory Visit: Payer: Self-pay

## 2013-01-05 MED ORDER — BUDESONIDE-FORMOTEROL FUMARATE 160-4.5 MCG/ACT IN AERO: 2.0000 | INHALATION_SPRAY | Freq: Two times a day (BID) | RESPIRATORY_TRACT | Status: DC

## 2013-01-22 ENCOUNTER — Other Ambulatory Visit: Payer: Self-pay | Admitting: Family Medicine

## 2013-01-24 NOTE — Telephone Encounter (Signed)
Last AIC 6.4 on 09/29/12. Please review thanks. Last oV 3/14.

## 2013-02-01 DIAGNOSIS — G894 Chronic pain syndrome: Secondary | ICD-10-CM | POA: Diagnosis not present

## 2013-02-01 DIAGNOSIS — M47817 Spondylosis without myelopathy or radiculopathy, lumbosacral region: Secondary | ICD-10-CM | POA: Diagnosis not present

## 2013-02-07 DIAGNOSIS — F329 Major depressive disorder, single episode, unspecified: Secondary | ICD-10-CM | POA: Diagnosis not present

## 2013-02-07 DIAGNOSIS — F3289 Other specified depressive episodes: Secondary | ICD-10-CM | POA: Diagnosis not present

## 2013-02-22 ENCOUNTER — Other Ambulatory Visit: Payer: Self-pay | Admitting: Family Medicine

## 2013-03-02 DIAGNOSIS — G894 Chronic pain syndrome: Secondary | ICD-10-CM | POA: Diagnosis not present

## 2013-03-02 DIAGNOSIS — F329 Major depressive disorder, single episode, unspecified: Secondary | ICD-10-CM | POA: Diagnosis not present

## 2013-03-02 DIAGNOSIS — M47817 Spondylosis without myelopathy or radiculopathy, lumbosacral region: Secondary | ICD-10-CM | POA: Diagnosis not present

## 2013-03-02 DIAGNOSIS — F3289 Other specified depressive episodes: Secondary | ICD-10-CM | POA: Diagnosis not present

## 2013-03-02 DIAGNOSIS — IMO0002 Reserved for concepts with insufficient information to code with codable children: Secondary | ICD-10-CM | POA: Diagnosis not present

## 2013-03-05 DIAGNOSIS — M47817 Spondylosis without myelopathy or radiculopathy, lumbosacral region: Secondary | ICD-10-CM | POA: Diagnosis not present

## 2013-03-18 ENCOUNTER — Other Ambulatory Visit: Payer: Self-pay | Admitting: Family Medicine

## 2013-03-20 ENCOUNTER — Other Ambulatory Visit: Payer: Self-pay | Admitting: Family Medicine

## 2013-03-21 DIAGNOSIS — M47817 Spondylosis without myelopathy or radiculopathy, lumbosacral region: Secondary | ICD-10-CM | POA: Diagnosis not present

## 2013-03-21 NOTE — Telephone Encounter (Signed)
LAST AIC 3/14-   6.4

## 2013-03-22 NOTE — Telephone Encounter (Signed)
Prescription renewed in [facility]. 

## 2013-03-23 ENCOUNTER — Other Ambulatory Visit: Payer: Self-pay | Admitting: Family Medicine

## 2013-03-26 ENCOUNTER — Other Ambulatory Visit: Payer: Self-pay | Admitting: Family Medicine

## 2013-04-05 ENCOUNTER — Other Ambulatory Visit: Payer: Self-pay | Admitting: Family Medicine

## 2013-04-05 DIAGNOSIS — F329 Major depressive disorder, single episode, unspecified: Secondary | ICD-10-CM | POA: Diagnosis not present

## 2013-04-05 DIAGNOSIS — M47817 Spondylosis without myelopathy or radiculopathy, lumbosacral region: Secondary | ICD-10-CM | POA: Diagnosis not present

## 2013-04-05 DIAGNOSIS — IMO0002 Reserved for concepts with insufficient information to code with codable children: Secondary | ICD-10-CM | POA: Diagnosis not present

## 2013-04-05 DIAGNOSIS — F3289 Other specified depressive episodes: Secondary | ICD-10-CM | POA: Diagnosis not present

## 2013-04-05 DIAGNOSIS — M7710 Lateral epicondylitis, unspecified elbow: Secondary | ICD-10-CM | POA: Diagnosis not present

## 2013-04-05 DIAGNOSIS — G894 Chronic pain syndrome: Secondary | ICD-10-CM | POA: Diagnosis not present

## 2013-04-20 ENCOUNTER — Other Ambulatory Visit: Payer: Self-pay | Admitting: Family Medicine

## 2013-04-24 NOTE — Telephone Encounter (Signed)
Prescription renewed in [facility]. 

## 2013-04-24 NOTE — Telephone Encounter (Signed)
Last seen 09/29/12  FPW

## 2013-04-27 DIAGNOSIS — M47817 Spondylosis without myelopathy or radiculopathy, lumbosacral region: Secondary | ICD-10-CM | POA: Diagnosis not present

## 2013-05-10 ENCOUNTER — Other Ambulatory Visit: Payer: Self-pay | Admitting: Family Medicine

## 2013-05-10 DIAGNOSIS — F329 Major depressive disorder, single episode, unspecified: Secondary | ICD-10-CM | POA: Diagnosis not present

## 2013-05-10 DIAGNOSIS — F3289 Other specified depressive episodes: Secondary | ICD-10-CM | POA: Diagnosis not present

## 2013-05-18 ENCOUNTER — Other Ambulatory Visit: Payer: Self-pay | Admitting: Family Medicine

## 2013-05-23 ENCOUNTER — Other Ambulatory Visit: Payer: Self-pay | Admitting: Family Medicine

## 2013-05-25 ENCOUNTER — Other Ambulatory Visit: Payer: Self-pay | Admitting: Family Medicine

## 2013-05-25 DIAGNOSIS — F329 Major depressive disorder, single episode, unspecified: Secondary | ICD-10-CM | POA: Diagnosis not present

## 2013-05-25 DIAGNOSIS — M47817 Spondylosis without myelopathy or radiculopathy, lumbosacral region: Secondary | ICD-10-CM | POA: Diagnosis not present

## 2013-05-25 DIAGNOSIS — G894 Chronic pain syndrome: Secondary | ICD-10-CM | POA: Diagnosis not present

## 2013-05-25 DIAGNOSIS — IMO0002 Reserved for concepts with insufficient information to code with codable children: Secondary | ICD-10-CM | POA: Diagnosis not present

## 2013-05-25 DIAGNOSIS — F3289 Other specified depressive episodes: Secondary | ICD-10-CM | POA: Diagnosis not present

## 2013-05-25 MED ORDER — BUDESONIDE-FORMOTEROL FUMARATE 160-4.5 MCG/ACT IN AERO: INHALATION_SPRAY | RESPIRATORY_TRACT | Status: DC

## 2013-05-25 MED ORDER — INSULIN ASPART PROT & ASPART (70-30 MIX) 100 UNIT/ML ~~LOC~~ SUSP: SUBCUTANEOUS | Status: DC

## 2013-05-25 MED ORDER — RIZATRIPTAN BENZOATE 10 MG PO TABS: 10.0000 mg | ORAL_TABLET | ORAL | Status: DC | PRN

## 2013-05-25 MED ORDER — QUETIAPINE FUMARATE 400 MG PO TABS: 400.0000 mg | ORAL_TABLET | Freq: Every day | ORAL | Status: DC

## 2013-05-25 NOTE — Telephone Encounter (Signed)
Call patient : Prescription refilled & sent to pharmacy in [facility]. 

## 2013-05-25 NOTE — Telephone Encounter (Signed)
Pt was last seen 09/29/12,no future appts made

## 2013-05-25 NOTE — Telephone Encounter (Signed)
Patient aware.

## 2013-05-28 NOTE — Telephone Encounter (Signed)
Last seen 3/14  FPW

## 2013-05-28 NOTE — Telephone Encounter (Signed)
ntbs by Dr. Modesto Charon

## 2013-06-12 ENCOUNTER — Encounter (INDEPENDENT_AMBULATORY_CARE_PROVIDER_SITE_OTHER): Payer: Self-pay

## 2013-06-12 ENCOUNTER — Encounter: Payer: Self-pay | Admitting: Family Medicine

## 2013-06-12 ENCOUNTER — Ambulatory Visit (INDEPENDENT_AMBULATORY_CARE_PROVIDER_SITE_OTHER): Payer: Medicare Other | Admitting: Family Medicine

## 2013-06-12 VITALS — BP 105/68 | HR 90 | Temp 98.6°F | Ht 74.0 in | Wt 262.6 lb

## 2013-06-12 DIAGNOSIS — R29898 Other symptoms and signs involving the musculoskeletal system: Secondary | ICD-10-CM | POA: Insufficient documentation

## 2013-06-12 DIAGNOSIS — Z23 Encounter for immunization: Secondary | ICD-10-CM

## 2013-06-12 DIAGNOSIS — N179 Acute kidney failure, unspecified: Secondary | ICD-10-CM | POA: Diagnosis not present

## 2013-06-12 DIAGNOSIS — R9431 Abnormal electrocardiogram [ECG] [EKG]: Secondary | ICD-10-CM

## 2013-06-12 DIAGNOSIS — R0989 Other specified symptoms and signs involving the circulatory and respiratory systems: Secondary | ICD-10-CM

## 2013-06-12 DIAGNOSIS — Z1211 Encounter for screening for malignant neoplasm of colon: Secondary | ICD-10-CM

## 2013-06-12 DIAGNOSIS — R339 Retention of urine, unspecified: Secondary | ICD-10-CM

## 2013-06-12 DIAGNOSIS — D492 Neoplasm of unspecified behavior of bone, soft tissue, and skin: Secondary | ICD-10-CM

## 2013-06-12 DIAGNOSIS — G4730 Sleep apnea, unspecified: Secondary | ICD-10-CM

## 2013-06-12 DIAGNOSIS — C140 Malignant neoplasm of pharynx, unspecified: Secondary | ICD-10-CM

## 2013-06-12 DIAGNOSIS — E039 Hypothyroidism, unspecified: Secondary | ICD-10-CM | POA: Diagnosis not present

## 2013-06-12 DIAGNOSIS — M2669 Other specified disorders of temporomandibular joint: Secondary | ICD-10-CM

## 2013-06-12 DIAGNOSIS — E785 Hyperlipidemia, unspecified: Secondary | ICD-10-CM

## 2013-06-12 DIAGNOSIS — E118 Type 2 diabetes mellitus with unspecified complications: Secondary | ICD-10-CM

## 2013-06-12 DIAGNOSIS — K5901 Slow transit constipation: Secondary | ICD-10-CM

## 2013-06-12 DIAGNOSIS — R0609 Other forms of dyspnea: Secondary | ICD-10-CM

## 2013-06-12 LAB — POCT UA - MICROALBUMIN: Microalbumin Ur, POC: 20 mg/L

## 2013-06-12 LAB — POCT GLYCOSYLATED HEMOGLOBIN (HGB A1C): Hemoglobin A1C: 5.9

## 2013-06-12 MED ORDER — POLYETHYLENE GLYCOL 3350 17 GM/SCOOP PO POWD: 1.0000 | Freq: Once | ORAL | Status: DC

## 2013-06-12 MED ORDER — "INSULIN SYRINGE-NEEDLE U-100 30G X 1/2"" 0.5 ML MISC": Status: DC

## 2013-06-12 MED ORDER — INSULIN ASPART PROT & ASPART (70-30 MIX) 100 UNIT/ML ~~LOC~~ SUSP: SUBCUTANEOUS | Status: DC

## 2013-06-12 MED ORDER — BUDESONIDE-FORMOTEROL FUMARATE 160-4.5 MCG/ACT IN AERO: INHALATION_SPRAY | RESPIRATORY_TRACT | Status: DC

## 2013-06-12 NOTE — Progress Notes (Addendum)
Patient ID: Joseph [name], male   DOB: [birth year], 52 y.o.   MRN: [id number] SUBJECTIVE: CC: Chief Complaint  Patient presents with  . Follow-up    follow up chronic problems  complete fill  . Medication Refill    needs refills     HPI:  Patient is here for follow up of Diabetes Mellitus/HLD/tracheostomy/obesity/ Symptoms evaluated: Denies Nocturia ,Denies Urinary Frequency , denies Blurred vision ,deniesDizziness,denies.Dysuria,denies paresthesias, denies extremity pain or ulcers.Marland Kitchendenies chest pain. has had an annual eye exam. do check the feet. Does check CBGs. Average FAO:ZHYQM test strips. Denies episodes of hypoglycemia. Does have an emergency hypoglycemic plan. admits toCompliance with medications. Denies Problems with medications.  Chronic  Constipation from the analgesics.  Past Medical History  Diagnosis Date  . Diabetes mellitus   . Hyperlipidemia   . Weight gain     After quitting smoking in 2005  . COPD (chronic obstructive pulmonary disease)     AB clinical dx; HFA 75% 02/28/10 > 90% Sept 21, 2011   Past Surgical History  Procedure Laterality Date  . 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 Knee arthroscopy      right   History   Social History  . Marital Status: Married    Spouse Name: N/A    Number of Children: N/A  . Years of Education: N/A   Occupational History  . Not on file.   Social History Main Topics  . Smoking status: Former Smoker -- 3.00 packs/day for 30 years    Quit date: 07/20/2003  . Smokeless tobacco: Not on file  . Alcohol Use: No  . Drug Use: Not on file  . Sexual Activity: Not on file   Other Topics Concern  . Not on file   Social History Narrative   Married with children   Family History  Problem Relation Age of Onset  . Emphysema Sister   . Prostate cancer Maternal Grandfather   . Clotting disorder Maternal Grandfather   . Atopy Neg Hx    Current Outpatient Prescriptions on File Prior to  Visit  Medication Sig Dispense Refill  . albuterol (PROVENTIL HFA;VENTOLIN HFA) 108 (90 BASE) MCG/ACT inhaler Inhale 2 puffs into the lungs every 6 (six) hours as needed.        Marland Kitchen albuterol (PROVENTIL) (2.5 MG/3ML) 0.083% nebulizer solution 1 vial in nebulizer four times daily  120 mL  0  . ALPRAZolam (XANAX) 1 MG tablet Take 1 mg by mouth 4 (four) times daily as needed.        . cyclobenzaprine (FLEXERIL) 10 MG tablet Take 10 mg by mouth 3 (three) times daily.        . fenofibrate 54 MG tablet Take 54 mg by mouth daily.        Marland Kitchen gabapentin (NEURONTIN) 300 MG capsule Take 1 capsule (300 mg total) by mouth 3 (three) times daily.  10 capsule  0  . ipratropium (ATROVENT) 0.02 % nebulizer solution 1 vial in nebulizer four times daily,   DX:  496  120 mL  0  . ipratropium-albuterol (DUONEB) 0.5-2.5 (3) MG/3ML SOLN Take 3 mLs by nebulization 4 (four) times daily.  360 mL  0  . levothyroxine (SYNTHROID, LEVOTHROID) 137 MCG tablet Take 137 mcg by mouth daily.        Marland Kitchen morphine (KADIAN) 60 MG 24 hr capsule Take 60 mg by mouth daily.        Marland Kitchen oxyCODONE-acetaminophen (PERCOCET) 7.5-325 MG per  tablet Take 1 tablet by mouth every 6 (six) hours as needed.        . pantoprazole (PROTONIX) 20 MG tablet TAKE 1 TABLET (20 MG TOTAL) BY MOUTH DAILY.  30 tablet  1  . QUEtiapine (SEROQUEL) 400 MG tablet Take 1 tablet (400 mg total) by mouth daily.  30 tablet  0  . rizatriptan (MAXALT) 10 MG tablet Take 1 tablet (10 mg total) by mouth as needed. May repeat in 2 hours if needed  10 tablet  0  . rosuvastatin (CRESTOR) 10 MG tablet Take 10 mg by mouth daily.        . sertraline (ZOLOFT) 50 MG tablet Take 100 mg by mouth daily.       . SYMBICORT 160-4.5 MCG/ACT inhaler INHALE 2 PUFFS FIRST THING IN AM AND 2 PUFFS AGAIN IN PM ABOUT 12 HOURS LATER  10.2 g  5  . SYMBICORT 160-4.5 MCG/ACT inhaler INHALE 2 PUFFS INTO THE LUNGS 2 (TWO) TIMES DAILY.  10.2 g  0  . SYMBICORT 160-4.5 MCG/ACT inhaler INHALE 2 PUFFS INTO THE LUNGS 2  (TWO) TIMES DAILY.  10.2 g  0  . Tamsulosin HCl (FLOMAX) 0.4 MG CAPS Take 0.4 mg by mouth daily.        . Vitamin D, Ergocalciferol, (DRISDOL) 50000 UNITS CAPS capsule TAKE ONE CAPSULE BY MOUTH EVERY WEEK  12 capsule  0   No current facility-administered medications on file prior to visit.   No Known Allergies Immunization History  Administered Date(s) Administered  . Influenza,inj,Quad PF,36+ Mos 06/12/2013  . Pneumococcal-Unspecified 04/28/2012  . Tdap 12/17/2011   Prior to Admission medications   Medication Sig Start Date End Date Taking? Authorizing Provider  albuterol (PROVENTIL HFA;VENTOLIN HFA) 108 (90 BASE) MCG/ACT inhaler Inhale 2 puffs into the lungs every 6 (six) hours as needed.      Historical Provider, MD  albuterol (PROVENTIL) (2.5 MG/3ML) 0.083% nebulizer solution 1 vial in nebulizer four times daily 04/20/11   Nyoka Cowden, MD  ALPRAZolam Prudy Feeler) 1 MG tablet Take 1 mg by mouth 4 (four) times daily as needed.      Historical Provider, MD  budesonide-formoterol (SYMBICORT) 160-4.5 MCG/ACT inhaler INHALE 2 PUFFS INTO THE LUNGS 2 (TWO) TIMES DAILY. 05/25/13   Ileana Ladd, MD  cyclobenzaprine (FLEXERIL) 10 MG tablet Take 10 mg by mouth 3 (three) times daily.      Historical Provider, MD  diazepam (VALIUM) 5 MG tablet  05/10/13   Historical Provider, MD  fenofibrate 54 MG tablet Take 54 mg by mouth daily.      Historical Provider, MD  gabapentin (NEURONTIN) 300 MG capsule Take 1 capsule (300 mg total) by mouth 3 (three) times daily. 10/31/11   Richarda Overlie, MD  insulin aspart protamine- aspart (NOVOLOG MIX 70/30) (70-30) 100 UNIT/ML injection INJECT 33 UNITS EVERY MORNING AND 38 UNITS EVERY EVENING 05/25/13   Ileana Ladd, MD  Insulin Syringe-Needle U-100 (B-D INS SYRINGE 0.5CC/30GX1/2") 30G X 1/2" 0.5 ML MISC Use bid to administer insulin 04/09/13   Ileana Ladd, MD  ipratropium (ATROVENT) 0.02 % nebulizer solution 1 vial in nebulizer four times daily,   DX:  496 04/22/11  04/21/12  Nyoka Cowden, MD  ipratropium-albuterol (DUONEB) 0.5-2.5 (3) MG/3ML SOLN Take 3 mLs by nebulization 4 (four) times daily. 04/20/11   Nyoka Cowden, MD  levothyroxine (SYNTHROID, LEVOTHROID) 137 MCG tablet Take 137 mcg by mouth daily.      Historical Provider, MD  morphine (KADIAN)  60 MG 24 hr capsule Take 60 mg by mouth daily.      Historical Provider, MD  oxyCODONE-acetaminophen (PERCOCET) 7.5-325 MG per tablet Take 1 tablet by mouth every 6 (six) hours as needed.      Historical Provider, MD  pantoprazole (PROTONIX) 20 MG tablet TAKE 1 TABLET (20 MG TOTAL) BY MOUTH DAILY. 03/20/13   Ernestina Penna, MD  QUEtiapine (SEROQUEL) 400 MG tablet Take 1 tablet (400 mg total) by mouth daily. 05/25/13   Ileana Ladd, MD  rizatriptan (MAXALT) 10 MG tablet Take 1 tablet (10 mg total) by mouth as needed. May repeat in 2 hours if needed 05/25/13   Ileana Ladd, MD  rosuvastatin (CRESTOR) 10 MG tablet Take 10 mg by mouth daily.      Historical Provider, MD  sertraline (ZOLOFT) 50 MG tablet Take 100 mg by mouth daily.     Historical Provider, MD  SYMBICORT 160-4.5 MCG/ACT inhaler INHALE 2 PUFFS FIRST THING IN AM AND 2 PUFFS AGAIN IN PM ABOUT 12 HOURS LATER 09/21/11 09/21/12  Nyoka Cowden, MD  SYMBICORT 160-4.5 MCG/ACT inhaler INHALE 2 PUFFS INTO THE LUNGS 2 (TWO) TIMES DAILY. 03/26/13   Ileana Ladd, MD  SYMBICORT 160-4.5 MCG/ACT inhaler INHALE 2 PUFFS INTO THE LUNGS 2 (TWO) TIMES DAILY. 05/25/13   Mary-Margaret Daphine Deutscher, FNP  Tamsulosin HCl (FLOMAX) 0.4 MG CAPS Take 0.4 mg by mouth daily.      Historical Provider, MD  traZODone (DESYREL) 100 MG tablet  04/20/13   Historical Provider, MD  Vitamin D, Ergocalciferol, (DRISDOL) 50000 UNITS CAPS capsule TAKE ONE CAPSULE BY MOUTH EVERY WEEK 03/23/13   Ileana Ladd, MD     ROS: As above in the HPI. All other systems are stable or negative.  OBJECTIVE: APPEARANCE:  Patient in no acute distress.The patient appeared well nourished and normally developed.  Acyanotic. Waist: VITAL SIGNS:BP 105/68  Pulse 90  Temp(Src) 98.6 F (37 C) (Oral)  Ht 6\' 2"  (1.88 m)  Wt 262 lb 9.6 oz (119.115 kg)  BMI 33.70 kg/m2 Obese WM  SKIN: warm and  Dry. Seborrheic keratosis on the back. Lesion right cheek 5 mm diameter.plaque pigmented. Possible seborrheic keratosis vs other neoplasm of unknown etiology.  HEAD and Neck: without JVD, Head and scalp: normal Eyes:No scleral icterus. Fundi normal, eye movements normal. Ears: Auricle normal, canal normal, Tympanic membranes normal, insufflation normal. Nose: normal Throat: normal Neck & thyroid: tracheostomy  Bilateral: TMJ clicks. Upper and lower dentures.  CHEST & LUNGS: Chest wall: normal Lungs: Coarse bs.  CVS: Reveals the PMI to be normally located. Regular rhythm, First and Second Heart sounds are normal,  absence of murmurs, rubs or gallops. Peripheral vasculature: Radial pulses: normal Dorsal pedis pulses: normal Posterior pulses: normal  ABDOMEN:  Appearance: normal Benign, no organomegaly, no masses, no Abdominal Aortic enlargement. No Guarding , no rebound. No Bruits. Bowel sounds: normal  RECTAL: N/A GU: N/A  EXTREMETIES: nonedematous.  MUSCULOSKELETAL:  Spine: reduced ROM, with discomfort  NEUROLOGIC: oriented to time,place and person; nonfocal.  ASSESSMENT: Need for prophylactic vaccination and inoculation against influenza  HYPERLIPIDEMIA - Plan: NMR, lipoprofile  Abnormal EKG  ARF (acute renal failure)  HYPOTHYROIDISM - Plan: TSH  DYSPNEA - Plan: budesonide-formoterol (SYMBICORT) 160-4.5 MCG/ACT inhaler  SLEEP APNEA  THROAT CANCER  Urinary retention with incomplete bladder emptying  Skin neoplasm - Plan: Ambulatory referral to Dermatology  Screen for colon cancer - Plan: Ambulatory referral to Gastroenterology  Type II or unspecified type diabetes  mellitus with unspecified complication, not stated as uncontrolled - Plan: insulin aspart protamine-  aspart (NOVOLOG MIX 70/30) (70-30) 100 UNIT/ML injection, Insulin Syringe-Needle U-100 (B-D INS SYRINGE 0.5CC/30GX1/2") 30G X 1/2" 0.5 ML MISC, POCT glycosylated hemoglobin (Hb A1C), CMP14+EGFR, POCT UA - Microalbumin, Microalbumin, urine  Constipation, slow transit - Plan: polyethylene glycol powder (GLYCOLAX/MIRALAX) powder  TMJ click  PLAN: Follow up with the specialists. Orders Placed This Encounter  Procedures  . CMP14+EGFR  . NMR, lipoprofile  . TSH  . Microalbumin, urine  . Ambulatory referral to Dermatology    Referral Priority:  Routine    Referral Type:  Consultation    Referral Reason:  Specialty Services Required    Requested Specialty:  Dermatology    Number of Visits Requested:  1  . Ambulatory referral to Gastroenterology    Referral Priority:  Routine    Referral Type:  Consultation    Referral Reason:  Specialty Services Required    Requested Specialty:  Gastroenterology    Number of Visits Requested:  1  . POCT glycosylated hemoglobin (Hb A1C)  . POCT UA - Microalbumin   Meds ordered this encounter  Medications  . diazepam (VALIUM) 5 MG tablet    Sig:   . traZODone (DESYREL) 100 MG tablet    Sig:   . budesonide-formoterol (SYMBICORT) 160-4.5 MCG/ACT inhaler    Sig: INHALE 2 PUFFS INTO THE LUNGS 2 (TWO) TIMES DAILY.    Dispense:  10.2 g    Refill:  5  . insulin aspart protamine- aspart (NOVOLOG MIX 70/30) (70-30) 100 UNIT/ML injection    Sig: INJECT 33 UNITS EVERY MORNING AND 38 UNITS EVERY EVENING    Dispense:  20 mL    Refill:  11  . Insulin Syringe-Needle U-100 (B-D INS SYRINGE 0.5CC/30GX1/2") 30G X 1/2" 0.5 ML MISC    Sig: Use bid to administer insulin    Dispense:  100 each    Refill:  11    Must be seen before next refill  . polyethylene glycol powder (GLYCOLAX/MIRALAX) powder    Sig: Take 255 g (1 Container total) by mouth once. Daily prn for constipation. Up to 1 week.    Dispense:  255 g    Refill:  1   Medications Discontinued During  This Encounter  Medication Reason  . amoxicillin-clavulanate (AUGMENTIN) 875-125 MG per tablet Completed Course  . budesonide-formoterol (SYMBICORT) 160-4.5 MCG/ACT inhaler Reorder  . insulin aspart protamine- aspart (NOVOLOG MIX 70/30) (70-30) 100 UNIT/ML injection Reorder  . Insulin Syringe-Needle U-100 (B-D INS SYRINGE 0.5CC/30GX1/2") 30G X 1/2" 0.5 ML MISC Reorder   Return in about 3 months (around 09/12/2013) for Recheck medical problems. Patient to see his  Dentist in regards to the TMJ problems.  Christyl Osentoski P. [place] Charon, M.D.

## 2013-06-13 LAB — MICROALBUMIN, URINE: Microalbumin, Urine: 7 ug/mL (ref 0.0–17.0)

## 2013-06-14 LAB — NMR, LIPOPROFILE
Cholesterol: 138 mg/dL (ref <200)
HDL Cholesterol by NMR: 49 mg/dL (ref >=40)
HDL Particle Number: 44.3 umol/L (ref >=30.5)
LDL Particle Number: 1059 nmol/L — ABNORMAL HIGH (ref <1000)
LDL Size: 20 nm — ABNORMAL LOW (ref >20.5)
LDLC SERPL CALC-MCNC: 51 mg/dL (ref <100)
LP-IR Score: 75 — ABNORMAL HIGH (ref <=45)
Small LDL Particle Number: 798 nmol/L — ABNORMAL HIGH (ref <=527)
Triglycerides by NMR: 192 mg/dL — ABNORMAL HIGH (ref <150)

## 2013-06-14 LAB — CMP14+EGFR
ALT: 14 IU/L (ref 0–44)
AST: 21 IU/L (ref 0–40)
Albumin/Globulin Ratio: 1.7 (ref 1.1–2.5)
Albumin: 4.7 g/dL (ref 3.5–5.5)
Alkaline Phosphatase: 22 IU/L — ABNORMAL LOW (ref 39–117)
BUN/Creatinine Ratio: 11 (ref 9–20)
BUN: 18 mg/dL (ref 6–24)
CO2: 26 mmol/L (ref 18–29)
Calcium: 10.1 mg/dL (ref 8.7–10.2)
Chloride: 101 mmol/L (ref 97–108)
Creatinine, Ser: 1.57 mg/dL — ABNORMAL HIGH (ref 0.76–1.27)
GFR calc Af Amer: 58 mL/min/{1.73_m2} — ABNORMAL LOW (ref >59)
GFR calc non Af Amer: 50 mL/min/{1.73_m2} — ABNORMAL LOW (ref >59)
Globulin, Total: 2.7 g/dL (ref 1.5–4.5)
Glucose: 95 mg/dL (ref 65–99)
Potassium: 4.4 mmol/L (ref 3.5–5.2)
Sodium: 140 mmol/L (ref 134–144)
Total Bilirubin: 0.2 mg/dL (ref 0.0–1.2)
Total Protein: 7.4 g/dL (ref 6.0–8.5)

## 2013-06-14 LAB — TSH: TSH: 141.4 u[IU]/mL — ABNORMAL HIGH (ref 0.450–4.500)

## 2013-06-17 ENCOUNTER — Other Ambulatory Visit: Payer: Self-pay | Admitting: Family Medicine

## 2013-06-17 NOTE — Progress Notes (Signed)
Quick Note:  Call Patient Labs abnormal: The thyroid is not at goal.!!! Is he taking his levothyroxine?? The HBGA1C is well controlled.   Recommendations:  If he is taking his levothyroxine and Did not miss any doses then we will need to increase the dosage. If he missed doses and has not been compliant he needs to get back on the levothyroxine and needs to be seen in 4 weeks.  ______

## 2013-06-18 ENCOUNTER — Other Ambulatory Visit: Payer: Self-pay | Admitting: Family Medicine

## 2013-06-19 ENCOUNTER — Other Ambulatory Visit: Payer: Self-pay | Admitting: Family Medicine

## 2013-06-19 ENCOUNTER — Telehealth: Payer: Self-pay

## 2013-06-19 DIAGNOSIS — E039 Hypothyroidism, unspecified: Secondary | ICD-10-CM

## 2013-06-19 MED ORDER — LEVOTHYROXINE SODIUM 137 MCG PO TABS: 137.0000 ug | ORAL_TABLET | Freq: Every day | ORAL | Status: DC

## 2013-06-19 NOTE — Telephone Encounter (Signed)
SPOKE WITH PT REGARDING LABS AND RX CALLED TO [facility]  APPT MADE FOR 1 MONTH.

## 2013-06-19 NOTE — Telephone Encounter (Signed)
Left message to return call for lab results.

## 2013-06-19 NOTE — Telephone Encounter (Signed)
Message copied by Pura Spice on Tue Jun 19, 2013  9:38 AM ------      Message from: Ileana Ladd      Created: Sun Jun 17, 2013  8:21 PM       Call Patient      Labs abnormal:      The thyroid is not at goal.!!!      Is he taking his levothyroxine??      The HBGA1C is well controlled.                  Recommendations:            If he is taking his levothyroxine and  Did not miss any doses then we will need to increase the dosage.      If he missed doses and has not been compliant he needs to get back on the levothyroxine and needs to be seen in 4 weeks.       ------

## 2013-06-25 ENCOUNTER — Telehealth: Payer: Self-pay | Admitting: Family Medicine

## 2013-06-25 DIAGNOSIS — IMO0002 Reserved for concepts with insufficient information to code with codable children: Secondary | ICD-10-CM | POA: Diagnosis not present

## 2013-06-25 DIAGNOSIS — F329 Major depressive disorder, single episode, unspecified: Secondary | ICD-10-CM | POA: Diagnosis not present

## 2013-06-25 DIAGNOSIS — M47817 Spondylosis without myelopathy or radiculopathy, lumbosacral region: Secondary | ICD-10-CM | POA: Diagnosis not present

## 2013-06-25 DIAGNOSIS — G894 Chronic pain syndrome: Secondary | ICD-10-CM | POA: Diagnosis not present

## 2013-06-25 DIAGNOSIS — F3289 Other specified depressive episodes: Secondary | ICD-10-CM | POA: Diagnosis not present

## 2013-06-25 NOTE — Telephone Encounter (Signed)
Forwarded to referral

## 2013-06-26 ENCOUNTER — Encounter: Payer: Self-pay | Admitting: Gastroenterology

## 2013-07-17 ENCOUNTER — Other Ambulatory Visit: Payer: Self-pay | Admitting: *Deleted

## 2013-07-17 NOTE — Telephone Encounter (Signed)
Pharmacy needs Dx code on script for test strips

## 2013-07-18 ENCOUNTER — Encounter: Payer: Self-pay | Admitting: Family Medicine

## 2013-07-18 ENCOUNTER — Ambulatory Visit: Payer: Medicare Other | Admitting: Family Medicine

## 2013-07-18 ENCOUNTER — Ambulatory Visit (INDEPENDENT_AMBULATORY_CARE_PROVIDER_SITE_OTHER): Payer: Medicare Other | Admitting: Family Medicine

## 2013-07-18 VITALS — BP 123/72 | HR 100 | Temp 98.5°F | Ht 74.0 in | Wt 259.4 lb

## 2013-07-18 DIAGNOSIS — G4730 Sleep apnea, unspecified: Secondary | ICD-10-CM | POA: Diagnosis not present

## 2013-07-18 DIAGNOSIS — F411 Generalized anxiety disorder: Secondary | ICD-10-CM

## 2013-07-18 DIAGNOSIS — C140 Malignant neoplasm of pharynx, unspecified: Secondary | ICD-10-CM

## 2013-07-18 DIAGNOSIS — E785 Hyperlipidemia, unspecified: Secondary | ICD-10-CM | POA: Diagnosis not present

## 2013-07-18 DIAGNOSIS — E039 Hypothyroidism, unspecified: Secondary | ICD-10-CM

## 2013-07-18 DIAGNOSIS — F419 Anxiety disorder, unspecified: Secondary | ICD-10-CM

## 2013-07-18 NOTE — Progress Notes (Signed)
Patient ID: Joseph [name], male   DOB: [birth year], 52 y.o.   MRN: [id number] SUBJECTIVE: CC: Chief Complaint  Patient presents with  . Follow-up    1 month follow up states was taken off xanax by dr lay but pt thinks needs it   . Medication Refill    refill crestor  c/o cramps   needs to ck TSH     HPI: Here for  Recheck of hypothyroidism. He has been non compliant with his medications because he depends on the pharmacy to fill his medications. Since he did not get a refill , he assumed incorrectly that he was to discontinue the Rx without asking questions. He would like his PCP to change his psychiatric medications and put him back on Xanax. He sees a Therapist, sports.   Past Medical History  Diagnosis Date  . Diabetes mellitus   . Hyperlipidemia   . Weight gain     After quitting smoking in 2005  . COPD (chronic obstructive pulmonary disease)     AB clinical dx; HFA 75% 02/28/10 > 90% Sept 21, 2011  . Anxiety    Past Surgical History  Procedure Laterality Date  . 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 Knee arthroscopy      right   History   Social History  . Marital Status: Married    Spouse Name: N/A    Number of Children: N/A  . Years of Education: N/A   Occupational History  . Not on file.   Social History Main Topics  . Smoking status: Former Smoker -- 3.00 packs/day for 30 years    Quit date: 07/20/2003  . Smokeless tobacco: Not on file  . Alcohol Use: No  . Drug Use: Not on file  . Sexual Activity: Not on file   Other Topics Concern  . Not on file   Social History Narrative   Married with children   Family History  Problem Relation Age of Onset  . Emphysema Sister   . Prostate cancer Maternal Grandfather   . Clotting disorder Maternal Grandfather   . Atopy Neg Hx    Current Outpatient Prescriptions on File Prior to Visit  Medication Sig Dispense Refill  . albuterol (PROVENTIL HFA;VENTOLIN HFA) 108 (90 BASE) MCG/ACT inhaler  Inhale 2 puffs into the lungs every 6 (six) hours as needed.        Marland Kitchen albuterol (PROVENTIL) (2.5 MG/3ML) 0.083% nebulizer solution 1 vial in nebulizer four times daily  120 mL  0  . budesonide-formoterol (SYMBICORT) 160-4.5 MCG/ACT inhaler INHALE 2 PUFFS INTO THE LUNGS 2 (TWO) TIMES DAILY.  10.2 g  5  . cyclobenzaprine (FLEXERIL) 10 MG tablet Take 10 mg by mouth 3 (three) times daily.        . diazepam (VALIUM) 5 MG tablet       . fenofibrate 54 MG tablet Take 54 mg by mouth daily.        Marland Kitchen gabapentin (NEURONTIN) 300 MG capsule Take 1 capsule (300 mg total) by mouth 3 (three) times daily.  10 capsule  0  . insulin aspart protamine- aspart (NOVOLOG MIX 70/30) (70-30) 100 UNIT/ML injection INJECT 33 UNITS EVERY MORNING AND 38 UNITS EVERY EVENING  20 mL  11  . Insulin Syringe-Needle U-100 (B-D INS SYRINGE 0.5CC/30GX1/2") 30G X 1/2" 0.5 ML MISC Use bid to administer insulin  100 each  11  . ipratropium-albuterol (DUONEB) 0.5-2.5 (3) MG/3ML SOLN Take 3 mLs by nebulization 4 (  four) times daily.  360 mL  0  . levothyroxine (SYNTHROID, LEVOTHROID) 137 MCG tablet Take 1 tablet (137 mcg total) by mouth daily.  30 tablet  5  . pantoprazole (PROTONIX) 20 MG tablet TAKE 1 TABLET (20 MG TOTAL) BY MOUTH DAILY.  30 tablet  5  . polyethylene glycol powder (GLYCOLAX/MIRALAX) powder Take 255 g (1 Container total) by mouth once. Daily prn for constipation. Up to 1 week.  255 g  1  . QUEtiapine (SEROQUEL) 400 MG tablet Take 1 tablet (400 mg total) by mouth daily.  30 tablet  0  . Tamsulosin HCl (FLOMAX) 0.4 MG CAPS Take 0.4 mg by mouth daily.        . traZODone (DESYREL) 100 MG tablet       . Vitamin D, Ergocalciferol, (DRISDOL) 50000 UNITS CAPS capsule TAKE ONE CAPSULE BY MOUTH EVERY WEEK  12 capsule  0  . ALPRAZolam (XANAX) 1 MG tablet Take 1 mg by mouth 4 (four) times daily as needed.       Marland Kitchen ipratropium (ATROVENT) 0.02 % nebulizer solution 1 vial in nebulizer four times daily,   DX:  496  120 mL  0  . morphine  (KADIAN) 60 MG 24 hr capsule Take 60 mg by mouth daily.        Marland Kitchen oxyCODONE-acetaminophen (PERCOCET) 7.5-325 MG per tablet Take 1 tablet by mouth every 6 (six) hours as needed.        . rizatriptan (MAXALT) 10 MG tablet Take 1 tablet (10 mg total) by mouth as needed. May repeat in 2 hours if needed  10 tablet  0  . rosuvastatin (CRESTOR) 10 MG tablet Take 10 mg by mouth daily.        . sertraline (ZOLOFT) 50 MG tablet Take 100 mg by mouth daily.       . SYMBICORT 160-4.5 MCG/ACT inhaler INHALE 2 PUFFS FIRST THING IN AM AND 2 PUFFS AGAIN IN PM ABOUT 12 HOURS LATER  10.2 g  5   No current facility-administered medications on file prior to visit.   No Known Allergies Immunization History  Administered Date(s) Administered  . Influenza,inj,Quad PF,36+ Mos 06/12/2013  . Pneumococcal-Unspecified 04/28/2012  . Tdap 12/17/2011   Prior to Admission medications   Medication Sig Start Date End Date Taking? Authorizing Provider  albuterol (PROVENTIL HFA;VENTOLIN HFA) 108 (90 BASE) MCG/ACT inhaler Inhale 2 puffs into the lungs every 6 (six) hours as needed.     Yes Historical Provider, MD  albuterol (PROVENTIL) (2.5 MG/3ML) 0.083% nebulizer solution 1 vial in nebulizer four times daily 04/20/11  Yes Nyoka Cowden, MD  budesonide-formoterol (SYMBICORT) 160-4.5 MCG/ACT inhaler INHALE 2 PUFFS INTO THE LUNGS 2 (TWO) TIMES DAILY. 06/12/13  Yes Ileana Ladd, MD  cyclobenzaprine (FLEXERIL) 10 MG tablet Take 10 mg by mouth 3 (three) times daily.     Yes Historical Provider, MD  diazepam (VALIUM) 5 MG tablet  05/10/13  Yes Historical Provider, MD  fenofibrate 54 MG tablet Take 54 mg by mouth daily.     Yes Historical Provider, MD  gabapentin (NEURONTIN) 300 MG capsule Take 1 capsule (300 mg total) by mouth 3 (three) times daily. 10/31/11  Yes Richarda Overlie, MD  insulin aspart protamine- aspart (NOVOLOG MIX 70/30) (70-30) 100 UNIT/ML injection INJECT 33 UNITS EVERY MORNING AND 38 UNITS EVERY EVENING 06/12/13  Yes  Ileana Ladd, MD  Insulin Syringe-Needle U-100 (B-D INS SYRINGE 0.5CC/30GX1/2") 30G X 1/2" 0.5 ML MISC Use bid to administer insulin  06/12/13  Yes Ileana Ladd, MD  ipratropium-albuterol (DUONEB) 0.5-2.5 (3) MG/3ML SOLN Take 3 mLs by nebulization 4 (four) times daily. 04/20/11  Yes Nyoka Cowden, MD  levothyroxine (SYNTHROID, LEVOTHROID) 137 MCG tablet Take 1 tablet (137 mcg total) by mouth daily. 06/19/13  Yes Ileana Ladd, MD  pantoprazole (PROTONIX) 20 MG tablet TAKE 1 TABLET (20 MG TOTAL) BY MOUTH DAILY. 06/18/13  Yes Ileana Ladd, MD  polyethylene glycol powder (GLYCOLAX/MIRALAX) powder Take 255 g (1 Container total) by mouth once. Daily prn for constipation. Up to 1 week. 06/12/13  Yes Ileana Ladd, MD  QUEtiapine (SEROQUEL) 400 MG tablet Take 1 tablet (400 mg total) by mouth daily. 05/25/13  Yes Ileana Ladd, MD  Tamsulosin HCl (FLOMAX) 0.4 MG CAPS Take 0.4 mg by mouth daily.     Yes Historical Provider, MD  traZODone (DESYREL) 100 MG tablet  04/20/13  Yes Historical Provider, MD  Vitamin D, Ergocalciferol, (DRISDOL) 50000 UNITS CAPS capsule TAKE ONE CAPSULE BY MOUTH EVERY WEEK 03/23/13  Yes Ileana Ladd, MD  ALPRAZolam Prudy Feeler) 1 MG tablet Take 1 mg by mouth 4 (four) times daily as needed.      Historical Provider, MD  ipratropium (ATROVENT) 0.02 % nebulizer solution 1 vial in nebulizer four times daily,   DX:  496 04/22/11 04/21/12  Nyoka Cowden, MD  morphine (KADIAN) 60 MG 24 hr capsule Take 60 mg by mouth daily.      Historical Provider, MD  oxyCODONE-acetaminophen (PERCOCET) 7.5-325 MG per tablet Take 1 tablet by mouth every 6 (six) hours as needed.      Historical Provider, MD  rizatriptan (MAXALT) 10 MG tablet Take 1 tablet (10 mg total) by mouth as needed. May repeat in 2 hours if needed 05/25/13   Ileana Ladd, MD  rosuvastatin (CRESTOR) 10 MG tablet Take 10 mg by mouth daily.      Historical Provider, MD  sertraline (ZOLOFT) 50 MG tablet Take 100 mg by mouth daily.      Historical Provider, MD  SYMBICORT 160-4.5 MCG/ACT inhaler INHALE 2 PUFFS FIRST THING IN AM AND 2 PUFFS AGAIN IN PM ABOUT 12 HOURS LATER 09/21/11 09/21/12  Nyoka Cowden, MD     ROS: As above in the HPI. All other systems are stable or negative.  OBJECTIVE: APPEARANCE:  Patient in no acute distress.The patient appeared well nourished and normally developed. Acyanotic. Waist: VITAL SIGNS:BP 123/72  Pulse 100  Temp(Src) 98.5 F (36.9 C) (Oral)  Ht 6\' 2"  (1.88 m)  Wt 259 lb 6.4 oz (117.663 kg)  BMI 33.29 kg/m2 Obese WM  SKIN: warm and  Dry without overt rashes, tattoos and scars  HEAD and Neck: without JVD, Head and scalp: normal Eyes:No scleral icterus. Fundi normal, eye movements normal. Ears: Auricle normal, canal normal, Tympanic membranes normal, insufflation normal. Nose: normal Throat: normal Neck & thyroid: abnormal: tracheotomy in place.  CHEST & LUNGS: Chest wall: normal Lungs: Clear  CVS: Reveals the PMI to be normally located. Regular rhythm, First and Second Heart sounds are normal,  absence of murmurs, rubs or gallops. Peripheral vasculature: Radial pulses: normal Dorsal pedis pulses: normal Posterior pulses: normal  ABDOMEN:  Appearance:Obese Benign, no organomegaly, no masses, no Abdominal Aortic enlargement. No Guarding , no rebound. No Bruits. Bowel sounds: normal  RECTAL: N/A GU: N/A  EXTREMETIES: nonedematous.  MUSCULOSKELETAL:  Spine: normal Joints: intact  NEUROLOGIC: oriented to time,place and person; nonfocal. Strength is normal Sensory is normal Reflexes  are normal Cranial Nerves are normal.  ASSESSMENT: Hypothyroid - Plan: TSH  HYPERLIPIDEMIA  THROAT CANCER  HYPOTHYROIDISM  SLEEP APNEA  Anxiety Poor compliance. Focused on xanax medications.  PLAN: counselled patient that it would inappropriate for me to change his psychiatric medications when he is under the care of a Psychiatrist. Advised him to discuss the  medication issues with his psychiatrist.  Await the TSH  Discussed with patient his lack of self motivation and responsible for his medcial care and medications and if he doesn't see a medication refilled he therefore needs to question it.  Orders Placed This Encounter  Procedures  . TSH   No orders of the defined types were placed in this encounter.   Medications Discontinued During This Encounter  Medication Reason  . SYMBICORT 160-4.5 MCG/ACT inhaler Duplicate  . SYMBICORT 160-4.5 MCG/ACT inhaler Duplicate   Return for he has a folllow up appointment in 2 months.  Leylani Duley P. [place] Charon, M.D.

## 2013-07-19 ENCOUNTER — Encounter: Payer: Self-pay | Admitting: Family Medicine

## 2013-07-19 ENCOUNTER — Other Ambulatory Visit: Payer: Self-pay | Admitting: Family Medicine

## 2013-07-19 DIAGNOSIS — E039 Hypothyroidism, unspecified: Secondary | ICD-10-CM

## 2013-07-19 DIAGNOSIS — F064 Anxiety disorder due to known physiological condition: Secondary | ICD-10-CM | POA: Insufficient documentation

## 2013-07-19 DIAGNOSIS — F419 Anxiety disorder, unspecified: Secondary | ICD-10-CM | POA: Insufficient documentation

## 2013-07-19 LAB — TSH: TSH: 23.2 u[IU]/mL — ABNORMAL HIGH (ref 0.450–4.500)

## 2013-07-19 MED ORDER — LEVOTHYROXINE SODIUM 150 MCG PO TABS: 150.0000 ug | ORAL_TABLET | Freq: Every day | ORAL | Status: DC

## 2013-07-19 NOTE — Progress Notes (Signed)
Quick Note:  Call Patient Labs that are abnormal: TSH is high meaning that we need to adjust the thyroid medication dosage and increase it. Adjusted in EPIC  The rest are at goal  Recommendations:    ______

## 2013-07-26 DIAGNOSIS — M47817 Spondylosis without myelopathy or radiculopathy, lumbosacral region: Secondary | ICD-10-CM | POA: Diagnosis not present

## 2013-07-26 DIAGNOSIS — IMO0002 Reserved for concepts with insufficient information to code with codable children: Secondary | ICD-10-CM | POA: Diagnosis not present

## 2013-07-26 DIAGNOSIS — F3289 Other specified depressive episodes: Secondary | ICD-10-CM | POA: Diagnosis not present

## 2013-07-26 DIAGNOSIS — G894 Chronic pain syndrome: Secondary | ICD-10-CM | POA: Diagnosis not present

## 2013-07-26 DIAGNOSIS — F329 Major depressive disorder, single episode, unspecified: Secondary | ICD-10-CM | POA: Diagnosis not present

## 2013-07-30 DIAGNOSIS — F329 Major depressive disorder, single episode, unspecified: Secondary | ICD-10-CM | POA: Diagnosis not present

## 2013-07-30 DIAGNOSIS — F3289 Other specified depressive episodes: Secondary | ICD-10-CM | POA: Diagnosis not present

## 2013-08-17 ENCOUNTER — Other Ambulatory Visit: Payer: Medicare Other | Admitting: Gastroenterology

## 2013-08-24 ENCOUNTER — Other Ambulatory Visit: Payer: Self-pay | Admitting: Family Medicine

## 2013-08-27 ENCOUNTER — Other Ambulatory Visit: Payer: Self-pay

## 2013-08-27 DIAGNOSIS — E039 Hypothyroidism, unspecified: Secondary | ICD-10-CM

## 2013-08-27 MED ORDER — PANTOPRAZOLE SODIUM 20 MG PO TBEC: 20.0000 mg | DELAYED_RELEASE_TABLET | Freq: Every day | ORAL | Status: DC

## 2013-08-27 MED ORDER — LEVOTHYROXINE SODIUM 150 MCG PO TABS: 150.0000 ug | ORAL_TABLET | Freq: Every day | ORAL | Status: DC

## 2013-08-29 ENCOUNTER — Other Ambulatory Visit: Payer: Self-pay | Admitting: *Deleted

## 2013-08-29 DIAGNOSIS — F3289 Other specified depressive episodes: Secondary | ICD-10-CM | POA: Diagnosis not present

## 2013-08-29 DIAGNOSIS — G894 Chronic pain syndrome: Secondary | ICD-10-CM | POA: Diagnosis not present

## 2013-08-29 DIAGNOSIS — M47817 Spondylosis without myelopathy or radiculopathy, lumbosacral region: Secondary | ICD-10-CM | POA: Diagnosis not present

## 2013-08-29 DIAGNOSIS — F329 Major depressive disorder, single episode, unspecified: Secondary | ICD-10-CM | POA: Diagnosis not present

## 2013-08-29 DIAGNOSIS — R0609 Other forms of dyspnea: Secondary | ICD-10-CM

## 2013-08-29 DIAGNOSIS — R0989 Other specified symptoms and signs involving the circulatory and respiratory systems: Principal | ICD-10-CM

## 2013-08-29 DIAGNOSIS — IMO0002 Reserved for concepts with insufficient information to code with codable children: Secondary | ICD-10-CM | POA: Diagnosis not present

## 2013-08-29 MED ORDER — BUDESONIDE-FORMOTEROL FUMARATE 160-4.5 MCG/ACT IN AERO: INHALATION_SPRAY | RESPIRATORY_TRACT | Status: DC

## 2013-08-31 ENCOUNTER — Other Ambulatory Visit: Payer: Self-pay | Admitting: *Deleted

## 2013-09-01 ENCOUNTER — Encounter: Payer: Self-pay | Admitting: Gastroenterology

## 2013-09-03 ENCOUNTER — Other Ambulatory Visit: Payer: Self-pay

## 2013-09-03 MED ORDER — ROSUVASTATIN CALCIUM 10 MG PO TABS: 10.0000 mg | ORAL_TABLET | Freq: Every day | ORAL | Status: DC

## 2013-09-03 MED ORDER — BUDESONIDE-FORMOTEROL FUMARATE 160-4.5 MCG/ACT IN AERO: INHALATION_SPRAY | RESPIRATORY_TRACT | Status: DC

## 2013-09-07 ENCOUNTER — Ambulatory Visit: Payer: Medicare Other | Admitting: Gastroenterology

## 2013-09-10 ENCOUNTER — Telehealth: Payer: Self-pay | Admitting: Family Medicine

## 2013-09-13 ENCOUNTER — Ambulatory Visit: Payer: Medicare Other | Admitting: Family Medicine

## 2013-09-14 ENCOUNTER — Other Ambulatory Visit: Payer: Self-pay | Admitting: Family Medicine

## 2013-09-20 ENCOUNTER — Ambulatory Visit (INDEPENDENT_AMBULATORY_CARE_PROVIDER_SITE_OTHER): Payer: Medicare Other | Admitting: Family Medicine

## 2013-09-20 ENCOUNTER — Encounter: Payer: Self-pay | Admitting: Family Medicine

## 2013-09-20 VITALS — BP 86/64 | HR 99 | Temp 98.4°F | Ht 74.0 in | Wt 252.6 lb

## 2013-09-20 DIAGNOSIS — F419 Anxiety disorder, unspecified: Secondary | ICD-10-CM

## 2013-09-20 DIAGNOSIS — G4730 Sleep apnea, unspecified: Secondary | ICD-10-CM

## 2013-09-20 DIAGNOSIS — Z85819 Personal history of malignant neoplasm of unspecified site of lip, oral cavity, and pharynx: Secondary | ICD-10-CM | POA: Insufficient documentation

## 2013-09-20 DIAGNOSIS — R339 Retention of urine, unspecified: Secondary | ICD-10-CM | POA: Diagnosis not present

## 2013-09-20 DIAGNOSIS — E039 Hypothyroidism, unspecified: Secondary | ICD-10-CM | POA: Diagnosis not present

## 2013-09-20 DIAGNOSIS — F411 Generalized anxiety disorder: Secondary | ICD-10-CM

## 2013-09-20 DIAGNOSIS — C140 Malignant neoplasm of pharynx, unspecified: Secondary | ICD-10-CM | POA: Diagnosis not present

## 2013-09-20 DIAGNOSIS — E119 Type 2 diabetes mellitus without complications: Secondary | ICD-10-CM | POA: Diagnosis not present

## 2013-09-20 DIAGNOSIS — E785 Hyperlipidemia, unspecified: Secondary | ICD-10-CM

## 2013-09-20 DIAGNOSIS — E559 Vitamin D deficiency, unspecified: Secondary | ICD-10-CM | POA: Diagnosis not present

## 2013-09-20 DIAGNOSIS — M069 Rheumatoid arthritis, unspecified: Secondary | ICD-10-CM

## 2013-09-20 LAB — POCT GLYCOSYLATED HEMOGLOBIN (HGB A1C): Hemoglobin A1C: 6.3

## 2013-09-20 NOTE — Progress Notes (Signed)
Patient ID: Joseph [name], male   DOB: [birth year], 53 y.o.   MRN: [id number] SUBJECTIVE: CC: Chief Complaint  Patient presents with  . Diabetes    3 month recheck    HPI: Patient is here for follow up of Diabetes Mellitus/HLD/RA: Symptoms evaluated: Denies Nocturia ,Denies Urinary Frequency , denies Blurred vision ,deniesDizziness,denies.Dysuria,denies paresthesias, denies extremity pain or ulcers.Marland Kitchendenies chest pain. has had an annual eye exam. do check the feet. Does check CBGs. Average WUJ:WJXBJ better Denies episodes of hypoglycemia. Does have an emergency hypoglycemic plan. admits toCompliance with medications. Denies Problems with medications.  Depression. The psychiatrists added more medications. Past Medical History  Diagnosis Date  . Diabetes mellitus   . Hyperlipidemia   . Weight gain     After quitting smoking in 2005  . COPD (chronic obstructive pulmonary disease)     AB clinical dx; HFA 75% 02/28/10 > 90% Sept 21, 2011  . Anxiety    Past Surgical History  Procedure Laterality Date  . 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 Knee arthroscopy      right   History   Social History  . Marital Status: Married    Spouse Name: N/A    Number of Children: N/A  . Years of Education: N/A   Occupational History  . Not on file.   Social History Main Topics  . Smoking status: Former Smoker -- 3.00 packs/day for 30 years    Quit date: 07/20/2003  . Smokeless tobacco: Not on file  . Alcohol Use: No  . Drug Use: Not on file  . Sexual Activity: Not on file   Other Topics Concern  . Not on file   Social History Narrative   Married with children   Family History  Problem Relation Age of Onset  . Emphysema Sister   . Prostate cancer Maternal Grandfather   . Clotting disorder Maternal Grandfather   . Atopy Neg Hx    Current Outpatient Prescriptions on File Prior to Visit  Medication Sig Dispense Refill  . albuterol (PROVENTIL  HFA;VENTOLIN HFA) 108 (90 BASE) MCG/ACT inhaler Inhale 2 puffs into the lungs every 6 (six) hours as needed.        Marland Kitchen albuterol (PROVENTIL) (2.5 MG/3ML) 0.083% nebulizer solution 1 vial in nebulizer four times daily  120 mL  0  . budesonide-formoterol (SYMBICORT) 160-4.5 MCG/ACT inhaler INHALE 2 PUFFS INTO THE LUNGS 2 (TWO) TIMES DAILY.  10.2 g  2  . diazepam (VALIUM) 5 MG tablet       . fenofibrate 54 MG tablet TAKE 1 TABLET BY MOUTH EVERY DAY  90 tablet  0  . gabapentin (NEURONTIN) 300 MG capsule Take 1 capsule (300 mg total) by mouth 3 (three) times daily.  10 capsule  0  . insulin aspart protamine- aspart (NOVOLOG MIX 70/30) (70-30) 100 UNIT/ML injection INJECT 33 UNITS EVERY MORNING AND 38 UNITS EVERY EVENING  20 mL  11  . Insulin Syringe-Needle U-100 (B-D INS SYRINGE 0.5CC/30GX1/2") 30G X 1/2" 0.5 ML MISC Use bid to administer insulin  100 each  11  . ipratropium-albuterol (DUONEB) 0.5-2.5 (3) MG/3ML SOLN Take 3 mLs by nebulization 4 (four) times daily.  360 mL  0  . levothyroxine (SYNTHROID, LEVOTHROID) 150 MCG tablet Take 1 tablet (150 mcg total) by mouth daily.  90 tablet  3  . morphine (KADIAN) 60 MG 24 hr capsule Take 60 mg by mouth daily.        Marland Kitchen  oxyCODONE-acetaminophen (PERCOCET) 7.5-325 MG per tablet Take 1 tablet by mouth every 6 (six) hours as needed.        . pantoprazole (PROTONIX) 20 MG tablet Take 1 tablet (20 mg total) by mouth daily.  90 tablet  1  . polyethylene glycol powder (GLYCOLAX/MIRALAX) powder Take 255 g (1 Container total) by mouth once. Daily prn for constipation. Up to 1 week.  255 g  1  . QUEtiapine (SEROQUEL) 400 MG tablet Take 1 tablet (400 mg total) by mouth daily.  30 tablet  0  . rizatriptan (MAXALT) 10 MG tablet Take 1 tablet (10 mg total) by mouth as needed. May repeat in 2 hours if needed  10 tablet  0  . rosuvastatin (CRESTOR) 10 MG tablet Take 1 tablet (10 mg total) by mouth daily.  90 tablet  0  . Tamsulosin HCl (FLOMAX) 0.4 MG CAPS Take 0.4 mg by mouth  daily.        . traZODone (DESYREL) 100 MG tablet       . ipratropium (ATROVENT) 0.02 % nebulizer solution 1 vial in nebulizer four times daily,   DX:  496  120 mL  0  . Vitamin D, Ergocalciferol, (DRISDOL) 50000 UNITS CAPS capsule TAKE ONE CAPSULE BY MOUTH EVERY WEEK  12 capsule  0   No current facility-administered medications on file prior to visit.   No Known Allergies Immunization History  Administered Date(s) Administered  . Influenza,inj,Quad PF,36+ Mos 06/12/2013  . Pneumococcal-Unspecified 04/28/2012  . Tdap 12/17/2011   Prior to Admission medications   Medication Sig Start Date End Date Taking? Authorizing Provider  albuterol (PROVENTIL HFA;VENTOLIN HFA) 108 (90 BASE) MCG/ACT inhaler Inhale 2 puffs into the lungs every 6 (six) hours as needed.     Yes Historical Provider, MD  albuterol (PROVENTIL) (2.5 MG/3ML) 0.083% nebulizer solution 1 vial in nebulizer four times daily 04/20/11  Yes Tanda Rockers, MD  budesonide-formoterol (SYMBICORT) 160-4.5 MCG/ACT inhaler INHALE 2 PUFFS INTO THE LUNGS 2 (TWO) TIMES DAILY. 08/29/13  Yes Vernie Shanks, MD  cyclobenzaprine (FLEXERIL) 10 MG tablet Take 10 mg by mouth 3 (three) times daily.     Yes Historical Provider, MD  diazepam (VALIUM) 5 MG tablet  05/10/13  Yes Historical Provider, MD  fenofibrate 54 MG tablet TAKE 1 TABLET BY MOUTH EVERY DAY   Yes Vernie Shanks, MD  gabapentin (NEURONTIN) 300 MG capsule Take 1 capsule (300 mg total) by mouth 3 (three) times daily. 10/31/11  Yes Reyne Dumas, MD  insulin aspart protamine- aspart (NOVOLOG MIX 70/30) (70-30) 100 UNIT/ML injection INJECT 33 UNITS EVERY MORNING AND 38 UNITS EVERY EVENING 06/12/13  Yes Vernie Shanks, MD  Insulin Syringe-Needle U-100 (B-D INS SYRINGE 0.5CC/30GX1/2") 30G X 1/2" 0.5 ML MISC Use bid to administer insulin 06/12/13  Yes Vernie Shanks, MD  ipratropium-albuterol (DUONEB) 0.5-2.5 (3) MG/3ML SOLN Take 3 mLs by nebulization 4 (four) times daily. 04/20/11  Yes Tanda Rockers, MD  levothyroxine (SYNTHROID, LEVOTHROID) 150 MCG tablet Take 1 tablet (150 mcg total) by mouth daily. 08/27/13  Yes Vernie Shanks, MD  morphine (KADIAN) 60 MG 24 hr capsule Take 60 mg by mouth daily.     Yes Historical Provider, MD  oxyCODONE-acetaminophen (PERCOCET) 7.5-325 MG per tablet Take 1 tablet by mouth every 6 (six) hours as needed.     Yes Historical Provider, MD  pantoprazole (PROTONIX) 20 MG tablet Take 1 tablet (20 mg total) by mouth daily. 08/27/13  Yes Dub Mikes  Darylene Price, MD  polyethylene glycol powder (GLYCOLAX/MIRALAX) powder Take 255 g (1 Container total) by mouth once. Daily prn for constipation. Up to 1 week. 06/12/13  Yes Vernie Shanks, MD  QUEtiapine (SEROQUEL) 400 MG tablet Take 1 tablet (400 mg total) by mouth daily. 05/25/13  Yes Vernie Shanks, MD  rizatriptan (MAXALT) 10 MG tablet Take 1 tablet (10 mg total) by mouth as needed. May repeat in 2 hours if needed 05/25/13  Yes Vernie Shanks, MD  rosuvastatin (CRESTOR) 10 MG tablet Take 1 tablet (10 mg total) by mouth daily. 09/03/13  Yes Vernie Shanks, MD  sertraline (ZOLOFT) 100 MG tablet Take 100 mg by mouth 2 (two) times daily.   Yes Historical Provider, MD  Tamsulosin HCl (FLOMAX) 0.4 MG CAPS Take 0.4 mg by mouth daily.     Yes Historical Provider, MD  traZODone (DESYREL) 100 MG tablet  04/20/13  Yes Historical Provider, MD  ipratropium (ATROVENT) 0.02 % nebulizer solution 1 vial in nebulizer four times daily,   DX:  496 04/22/11 04/21/12  Tanda Rockers, MD  Vitamin D, Ergocalciferol, (DRISDOL) 50000 UNITS CAPS capsule TAKE ONE CAPSULE BY MOUTH EVERY WEEK 03/23/13   Vernie Shanks, MD     ROS: As above in the HPI. All other systems are stable or negative.  OBJECTIVE: APPEARANCE:  Patient in no acute distress.The patient appeared well nourished and normally developed. Acyanotic. Waist: VITAL SIGNS:BP 86/64  Pulse 99  Temp(Src) 98.4 F (36.9 C) (Oral)  Ht 6\' 2"  (1.88 m)  Wt 252 lb 9.6 oz (114.579 kg)  BMI 32.42  kg/m2  Obese WM weight better. BP is a little low. He is not symptomatic.  SKIN: warm and  Dry without overt rashes, tattoos and scars  HEAD and Neck: without JVD, Head and scalp: normal Eyes:No scleral icterus. Fundi normal, eye movements normal. Ears: Auricle normal, canal normal, Tympanic membranes normal, insufflation normal. Nose: normal Throat: normal Neck: tracheotomy. thyroid: normal  CHEST & LUNGS: Chest wall: normal Lungs: Clear  CVS: Reveals the PMI to be normally located. Regular rhythm, First and Second Heart sounds are normal,  absence of murmurs, rubs or gallops. Peripheral vasculature: Radial pulses: normal Dorsal pedis pulses: normal Posterior pulses: normal  ABDOMEN:  Appearance: obese Benign, no organomegaly, no masses, no Abdominal Aortic enlargement. No Guarding , no rebound. No Bruits. Bowel sounds: normal  RECTAL: N/A GU: N/A  EXTREMETIES: nonedematous.  MUSCULOSKELETAL:  Spine: reduce ROm Joints: intact  NEUROLOGIC: oriented to time,place and person; nonfocal.  ASSESSMENT:   Diabetes mellitus - Plan: POCT glycosylated hemoglobin (Hb A1C)  Urinary retention with incomplete bladder emptying  THROAT CANCER  SLEEP APNEA  Rheumatoid arthritis(714.0)  HYPOTHYROIDISM - Plan: TSH  HYPERLIPIDEMIA - Plan: CMP14+EGFR, NMR, lipoprofile  Anxiety  Unspecified vitamin D deficiency - Plan: Vit D  25 hydroxy (rtn osteoporosis monitoring)  PLAN:      Dr Paula Libra Recommendations  For nutrition information, I recommend books:  1).Eat to Live by Dr Excell Seltzer. 2).Prevent and Reverse Heart Disease by Dr Karl Luke. 3) Dr Janene Harvey Book:  Program to Reverse Diabetes  Exercise recommendations are:  If unable to walk, then the patient can exercise in a chair 3 times a day. By flapping arms like a bird gently and raising legs outwards to the front.  If ambulatory, the patient can go for walks for 30 minutes 3 times a  week. Then increase the intensity and duration as tolerated.  Goal is  to try to attain exercise frequency to 5 times a week.  If applicable: Best to perform resistance exercises (machines or weights) 2 days a week and cardio type exercises 3 days per week.   Foot care handouts in the AVS.  Orders Placed This Encounter  Procedures  . CMP14+EGFR  . NMR, lipoprofile  . TSH  . Vit D  25 hydroxy (rtn osteoporosis monitoring)  . POCT glycosylated hemoglobin (Hb A1C)   Meds ordered this encounter  Medications  . sertraline (ZOLOFT) 100 MG tablet    Sig: Take 100 mg by mouth 2 (two) times daily.   Medications Discontinued During This Encounter  Medication Reason  . ALPRAZolam (XANAX) 1 MG tablet Change in therapy  . budesonide-formoterol (SYMBICORT) 160-4.5 MCG/ACT inhaler Duplicate  . sertraline (ZOLOFT) 50 MG tablet Change in therapy  . cyclobenzaprine (FLEXERIL) 10 MG tablet Completed Course   Return in about 3 months (around 12/21/2013) for Recheck medical problems.  Didier Brandenburg P. Jacelyn Grip, M.D.

## 2013-09-20 NOTE — Patient Instructions (Signed)
    Dr Jamariya Davidoff's Recommendations  For nutrition information, I recommend books:  1).Eat to Live by Dr Joel Fuhrman. 2).Prevent and Reverse Heart Disease by Dr Caldwell Esselstyn. 3) Dr Neal Barnard's Book:  Program to Reverse Diabetes  Exercise recommendations are:  If unable to walk, then the patient can exercise in a chair 3 times a day. By flapping arms like a bird gently and raising legs outwards to the front.  If ambulatory, the patient can go for walks for 30 minutes 3 times a week. Then increase the intensity and duration as tolerated.  Goal is to try to attain exercise frequency to 5 times a week.  If applicable: Best to perform resistance exercises (machines or weights) 2 days a week and cardio type exercises 3 days per week.   Diabetes and Foot Care Diabetes may cause you to have problems because of poor blood supply (circulation) to your feet and legs. This may cause the skin on your feet to become thinner, break easier, and heal more slowly. Your skin may become dry, and the skin may peel and crack. You may also have nerve damage in your legs and feet causing decreased feeling in them. You may not notice minor injuries to your feet that could lead to infections or more serious problems. Taking care of your feet is one of the most important things you can do for yourself.  HOME CARE INSTRUCTIONS  Wear shoes at all times, even in the house. Do not go barefoot. Bare feet are easily injured.  Check your feet daily for blisters, cuts, and redness. If you cannot see the bottom of your feet, use a mirror or ask someone for help.  Wash your feet with warm water (do not use hot water) and mild soap. Then pat your feet and the areas between your toes until they are completely dry. Do not soak your feet as this can dry your skin.  Apply a moisturizing lotion or petroleum jelly (that does not contain alcohol and is unscented) to the skin on your feet and to dry, brittle toenails.  Do not apply lotion between your toes.  Trim your toenails straight across. Do not dig under them or around the cuticle. File the edges of your nails with an emery board or nail file.  Do not cut corns or calluses or try to remove them with medicine.  Wear clean socks or stockings every day. Make sure they are not too tight. Do not wear knee-high stockings since they may decrease blood flow to your legs.  Wear shoes that fit properly and have enough cushioning. To break in new shoes, wear them for just a few hours a day. This prevents you from injuring your feet. Always look in your shoes before you put them on to be sure there are no objects inside.  Do not cross your legs. This may decrease the blood flow to your feet.  If you find a minor scrape, cut, or break in the skin on your feet, keep it and the skin around it clean and dry. These areas may be cleansed with mild soap and water. Do not cleanse the area with peroxide, alcohol, or iodine.  When you remove an adhesive bandage, be sure not to damage the skin around it.  If you have a wound, look at it several times a day to make sure it is healing.  Do not use heating pads or hot water bottles. They may burn your skin. If   you have lost feeling in your feet or legs, you may not know it is happening until it is too late.  Make sure your health care provider performs a complete foot exam at least annually or more often if you have foot problems. Report any cuts, sores, or bruises to your health care provider immediately. SEEK MEDICAL CARE IF:   You have an injury that is not healing.  You have cuts or breaks in the skin.  You have an ingrown nail.  You notice redness on your legs or feet.  You feel burning or tingling in your legs or feet.  You have pain or cramps in your legs and feet.  Your legs or feet are numb.  Your feet always feel cold. SEEK IMMEDIATE MEDICAL CARE IF:   There is increasing redness, swelling, or pain in  or around a wound.  There is a red line that goes up your leg.  Pus is coming from a wound.  You develop a fever or as directed by your health care provider.  You notice a bad smell coming from an ulcer or wound. Document Released: 07/02/2000 Document Revised: 03/07/2013 Document Reviewed: 12/12/2012 Exit[employer] Patient Information 2014 [employer].  

## 2013-09-22 LAB — CMP14+EGFR
ALT: 21 IU/L (ref 0–44)
AST: 21 IU/L (ref 0–40)
Albumin/Globulin Ratio: 1.7 (ref 1.1–2.5)
Albumin: 4.5 g/dL (ref 3.5–5.5)
Alkaline Phosphatase: 24 IU/L — ABNORMAL LOW (ref 39–117)
BUN/Creatinine Ratio: 17 (ref 9–20)
BUN: 19 mg/dL (ref 6–24)
CO2: 21 mmol/L (ref 18–29)
Calcium: 9.8 mg/dL (ref 8.7–10.2)
Chloride: 104 mmol/L (ref 97–108)
Creatinine, Ser: 1.15 mg/dL (ref 0.76–1.27)
GFR calc Af Amer: 84 mL/min/{1.73_m2} (ref >59)
GFR calc non Af Amer: 72 mL/min/{1.73_m2} (ref >59)
Globulin, Total: 2.7 g/dL (ref 1.5–4.5)
Glucose: 116 mg/dL — ABNORMAL HIGH (ref 65–99)
Potassium: 4.3 mmol/L (ref 3.5–5.2)
Sodium: 144 mmol/L (ref 134–144)
Total Bilirubin: <0.2 mg/dL (ref 0.0–1.2)
Total Protein: 7.2 g/dL (ref 6.0–8.5)

## 2013-09-22 LAB — NMR, LIPOPROFILE
Cholesterol: 136 mg/dL (ref <200)
HDL Cholesterol by NMR: 37 mg/dL — ABNORMAL LOW (ref >=40)
HDL Particle Number: 37.6 umol/L (ref >=30.5)
LDL Particle Number: 873 nmol/L (ref <1000)
LDL Size: 19.6 nm — ABNORMAL LOW (ref >20.5)
LDLC SERPL CALC-MCNC: 38 mg/dL (ref <100)
LP-IR Score: 89 — ABNORMAL HIGH (ref <=45)
Small LDL Particle Number: 717 nmol/L — ABNORMAL HIGH (ref <=527)
Triglycerides by NMR: 307 mg/dL — ABNORMAL HIGH (ref <150)

## 2013-09-22 LAB — TSH: TSH: 3.27 u[IU]/mL (ref 0.450–4.500)

## 2013-09-22 LAB — VITAMIN D 25 HYDROXY (VIT D DEFICIENCY, FRACTURES): Vit D, 25-Hydroxy: 21.8 ng/mL — ABNORMAL LOW (ref 30.0–100.0)

## 2013-09-26 DIAGNOSIS — F329 Major depressive disorder, single episode, unspecified: Secondary | ICD-10-CM | POA: Diagnosis not present

## 2013-09-26 DIAGNOSIS — Z79899 Other long term (current) drug therapy: Secondary | ICD-10-CM | POA: Diagnosis not present

## 2013-09-26 DIAGNOSIS — F3289 Other specified depressive episodes: Secondary | ICD-10-CM | POA: Diagnosis not present

## 2013-09-26 DIAGNOSIS — G894 Chronic pain syndrome: Secondary | ICD-10-CM | POA: Diagnosis not present

## 2013-09-26 DIAGNOSIS — IMO0002 Reserved for concepts with insufficient information to code with codable children: Secondary | ICD-10-CM | POA: Diagnosis not present

## 2013-09-26 DIAGNOSIS — M47817 Spondylosis without myelopathy or radiculopathy, lumbosacral region: Secondary | ICD-10-CM | POA: Diagnosis not present

## 2013-09-27 DIAGNOSIS — Z79899 Other long term (current) drug therapy: Secondary | ICD-10-CM | POA: Diagnosis not present

## 2013-10-02 DIAGNOSIS — H903 Sensorineural hearing loss, bilateral: Secondary | ICD-10-CM | POA: Diagnosis not present

## 2013-10-02 DIAGNOSIS — H905 Unspecified sensorineural hearing loss: Secondary | ICD-10-CM | POA: Diagnosis not present

## 2013-10-02 DIAGNOSIS — H9319 Tinnitus, unspecified ear: Secondary | ICD-10-CM | POA: Diagnosis not present

## 2013-10-16 DIAGNOSIS — R3916 Straining to void: Secondary | ICD-10-CM | POA: Diagnosis not present

## 2013-10-16 DIAGNOSIS — Z125 Encounter for screening for malignant neoplasm of prostate: Secondary | ICD-10-CM | POA: Diagnosis not present

## 2013-10-22 DIAGNOSIS — F3289 Other specified depressive episodes: Secondary | ICD-10-CM | POA: Diagnosis not present

## 2013-10-22 DIAGNOSIS — F329 Major depressive disorder, single episode, unspecified: Secondary | ICD-10-CM | POA: Diagnosis not present

## 2013-10-23 DIAGNOSIS — R3916 Straining to void: Secondary | ICD-10-CM | POA: Diagnosis not present

## 2013-10-23 DIAGNOSIS — N319 Neuromuscular dysfunction of bladder, unspecified: Secondary | ICD-10-CM | POA: Diagnosis not present

## 2013-10-23 DIAGNOSIS — N529 Male erectile dysfunction, unspecified: Secondary | ICD-10-CM | POA: Diagnosis not present

## 2013-10-25 DIAGNOSIS — G894 Chronic pain syndrome: Secondary | ICD-10-CM | POA: Diagnosis not present

## 2013-10-25 DIAGNOSIS — IMO0002 Reserved for concepts with insufficient information to code with codable children: Secondary | ICD-10-CM | POA: Diagnosis not present

## 2013-10-25 DIAGNOSIS — M47817 Spondylosis without myelopathy or radiculopathy, lumbosacral region: Secondary | ICD-10-CM | POA: Diagnosis not present

## 2013-10-25 DIAGNOSIS — F3289 Other specified depressive episodes: Secondary | ICD-10-CM | POA: Diagnosis not present

## 2013-10-25 DIAGNOSIS — F329 Major depressive disorder, single episode, unspecified: Secondary | ICD-10-CM | POA: Diagnosis not present

## 2013-10-26 ENCOUNTER — Telehealth: Payer: Self-pay | Admitting: Neurology

## 2013-10-26 NOTE — Telephone Encounter (Signed)
Received 6 pages from [facility], faxed to [facility] to Dr. Neena Rhymes. 10/26/13/ss.

## 2013-11-19 ENCOUNTER — Other Ambulatory Visit: Payer: Self-pay | Admitting: Family Medicine

## 2013-11-21 DIAGNOSIS — F329 Major depressive disorder, single episode, unspecified: Secondary | ICD-10-CM | POA: Diagnosis not present

## 2013-11-21 DIAGNOSIS — F3289 Other specified depressive episodes: Secondary | ICD-10-CM | POA: Diagnosis not present

## 2013-11-21 DIAGNOSIS — G894 Chronic pain syndrome: Secondary | ICD-10-CM | POA: Diagnosis not present

## 2013-11-21 DIAGNOSIS — M47817 Spondylosis without myelopathy or radiculopathy, lumbosacral region: Secondary | ICD-10-CM | POA: Diagnosis not present

## 2013-11-21 DIAGNOSIS — IMO0002 Reserved for concepts with insufficient information to code with codable children: Secondary | ICD-10-CM | POA: Diagnosis not present

## 2013-12-17 ENCOUNTER — Other Ambulatory Visit: Payer: Self-pay

## 2013-12-17 NOTE — Telephone Encounter (Signed)
x

## 2013-12-19 DIAGNOSIS — F329 Major depressive disorder, single episode, unspecified: Secondary | ICD-10-CM | POA: Diagnosis not present

## 2013-12-19 DIAGNOSIS — M47817 Spondylosis without myelopathy or radiculopathy, lumbosacral region: Secondary | ICD-10-CM | POA: Diagnosis not present

## 2013-12-19 DIAGNOSIS — IMO0002 Reserved for concepts with insufficient information to code with codable children: Secondary | ICD-10-CM | POA: Diagnosis not present

## 2013-12-19 DIAGNOSIS — G894 Chronic pain syndrome: Secondary | ICD-10-CM | POA: Diagnosis not present

## 2013-12-19 DIAGNOSIS — F3289 Other specified depressive episodes: Secondary | ICD-10-CM | POA: Diagnosis not present

## 2013-12-20 ENCOUNTER — Other Ambulatory Visit: Payer: Self-pay

## 2014-01-15 DIAGNOSIS — F329 Major depressive disorder, single episode, unspecified: Secondary | ICD-10-CM | POA: Diagnosis not present

## 2014-01-15 DIAGNOSIS — F3289 Other specified depressive episodes: Secondary | ICD-10-CM | POA: Diagnosis not present

## 2014-01-16 DIAGNOSIS — F3289 Other specified depressive episodes: Secondary | ICD-10-CM | POA: Diagnosis not present

## 2014-01-16 DIAGNOSIS — G894 Chronic pain syndrome: Secondary | ICD-10-CM | POA: Diagnosis not present

## 2014-01-16 DIAGNOSIS — M7710 Lateral epicondylitis, unspecified elbow: Secondary | ICD-10-CM | POA: Diagnosis not present

## 2014-01-16 DIAGNOSIS — IMO0002 Reserved for concepts with insufficient information to code with codable children: Secondary | ICD-10-CM | POA: Diagnosis not present

## 2014-01-16 DIAGNOSIS — F329 Major depressive disorder, single episode, unspecified: Secondary | ICD-10-CM | POA: Diagnosis not present

## 2014-01-16 DIAGNOSIS — M47817 Spondylosis without myelopathy or radiculopathy, lumbosacral region: Secondary | ICD-10-CM | POA: Diagnosis not present

## 2014-01-24 ENCOUNTER — Other Ambulatory Visit: Payer: Self-pay

## 2014-01-24 MED ORDER — FENOFIBRATE 54 MG PO TABS: ORAL_TABLET | ORAL | Status: DC

## 2014-01-25 ENCOUNTER — Other Ambulatory Visit: Payer: Self-pay | Admitting: Family Medicine

## 2014-01-26 ENCOUNTER — Other Ambulatory Visit: Payer: Self-pay | Admitting: Nurse Practitioner

## 2014-01-26 DIAGNOSIS — E039 Hypothyroidism, unspecified: Secondary | ICD-10-CM

## 2014-01-26 MED ORDER — LEVOTHYROXINE SODIUM 150 MCG PO TABS: 150.0000 ug | ORAL_TABLET | Freq: Every day | ORAL | Status: DC

## 2014-01-26 MED ORDER — FENOFIBRATE 54 MG PO TABS: ORAL_TABLET | ORAL | Status: DC

## 2014-02-13 DIAGNOSIS — IMO0002 Reserved for concepts with insufficient information to code with codable children: Secondary | ICD-10-CM | POA: Diagnosis not present

## 2014-02-13 DIAGNOSIS — F329 Major depressive disorder, single episode, unspecified: Secondary | ICD-10-CM | POA: Diagnosis not present

## 2014-02-13 DIAGNOSIS — M47817 Spondylosis without myelopathy or radiculopathy, lumbosacral region: Secondary | ICD-10-CM | POA: Diagnosis not present

## 2014-02-13 DIAGNOSIS — F3289 Other specified depressive episodes: Secondary | ICD-10-CM | POA: Diagnosis not present

## 2014-02-13 DIAGNOSIS — G894 Chronic pain syndrome: Secondary | ICD-10-CM | POA: Diagnosis not present

## 2014-03-13 DIAGNOSIS — F3289 Other specified depressive episodes: Secondary | ICD-10-CM | POA: Diagnosis not present

## 2014-03-13 DIAGNOSIS — G894 Chronic pain syndrome: Secondary | ICD-10-CM | POA: Diagnosis not present

## 2014-03-13 DIAGNOSIS — F329 Major depressive disorder, single episode, unspecified: Secondary | ICD-10-CM | POA: Diagnosis not present

## 2014-03-13 DIAGNOSIS — M47817 Spondylosis without myelopathy or radiculopathy, lumbosacral region: Secondary | ICD-10-CM | POA: Diagnosis not present

## 2014-03-13 DIAGNOSIS — E1149 Type 2 diabetes mellitus with other diabetic neurological complication: Secondary | ICD-10-CM | POA: Diagnosis not present

## 2014-03-14 ENCOUNTER — Other Ambulatory Visit: Payer: Self-pay | Admitting: *Deleted

## 2014-03-15 ENCOUNTER — Other Ambulatory Visit: Payer: Self-pay

## 2014-03-18 ENCOUNTER — Other Ambulatory Visit: Payer: Self-pay | Admitting: *Deleted

## 2014-03-19 ENCOUNTER — Other Ambulatory Visit: Payer: Self-pay | Admitting: Nurse Practitioner

## 2014-03-27 ENCOUNTER — Telehealth: Payer: Self-pay | Admitting: Family Medicine

## 2014-03-28 ENCOUNTER — Encounter: Payer: Self-pay | Admitting: Family Medicine

## 2014-03-28 ENCOUNTER — Ambulatory Visit (INDEPENDENT_AMBULATORY_CARE_PROVIDER_SITE_OTHER): Payer: Medicare Other | Admitting: Family Medicine

## 2014-03-28 VITALS — BP 130/78 | HR 113 | Temp 99.0°F | Ht 74.0 in | Wt 254.0 lb

## 2014-03-28 DIAGNOSIS — E109 Type 1 diabetes mellitus without complications: Secondary | ICD-10-CM

## 2014-03-28 DIAGNOSIS — J209 Acute bronchitis, unspecified: Secondary | ICD-10-CM

## 2014-03-28 DIAGNOSIS — R0989 Other specified symptoms and signs involving the circulatory and respiratory systems: Secondary | ICD-10-CM | POA: Diagnosis not present

## 2014-03-28 DIAGNOSIS — R05 Cough: Secondary | ICD-10-CM | POA: Diagnosis not present

## 2014-03-28 DIAGNOSIS — R059 Cough, unspecified: Secondary | ICD-10-CM | POA: Diagnosis not present

## 2014-03-28 DIAGNOSIS — J208 Acute bronchitis due to other specified organisms: Secondary | ICD-10-CM

## 2014-03-28 LAB — POCT CBC
Granulocyte percent: 74.9 %G (ref 37–80)
HCT, POC: 39.9 % — AB (ref 43.5–53.7)
Hemoglobin: 13.4 g/dL — AB (ref 14.1–18.1)
Lymph, poc: 1.4 (ref 0.6–3.4)
MCH, POC: 29.4 pg (ref 27–31.2)
MCHC: 33.7 g/dL (ref 31.8–35.4)
MCV: 87.2 fL (ref 80–97)
MPV: 8.8 fL (ref 0–99.8)
POC Granulocyte: 4.9 (ref 2–6.9)
POC LYMPH PERCENT: 20.6 %L (ref 10–50)
Platelet Count, POC: 191 10*3/uL (ref 142–424)
RBC: 4.6 M/uL — AB (ref 4.69–6.13)
RDW, POC: 14.2 %
WBC: 6.6 10*3/uL (ref 4.6–10.2)

## 2014-03-28 LAB — POCT GLYCOSYLATED HEMOGLOBIN (HGB A1C): Hemoglobin A1C: 5.7

## 2014-03-28 LAB — GLUCOSE, POCT (MANUAL RESULT ENTRY): POC Glucose: 154 mg/dl — AB (ref 70–99)

## 2014-03-28 MED ORDER — PREDNISONE 10 MG PO TABS: ORAL_TABLET | ORAL | Status: DC

## 2014-03-28 MED ORDER — HYDROCODONE-HOMATROPINE 5-1.5 MG/5ML PO SYRP: 5.0000 mL | ORAL_SOLUTION | Freq: Three times a day (TID) | ORAL | Status: DC | PRN

## 2014-03-28 MED ORDER — LEVOFLOXACIN 500 MG PO TABS: 500.0000 mg | ORAL_TABLET | Freq: Every day | ORAL | Status: DC

## 2014-03-28 NOTE — Telephone Encounter (Signed)
appt made for cough and congestion

## 2014-03-28 NOTE — Progress Notes (Signed)
   Subjective:    Patient ID: Joseph [name], male    DOB: [birth year], 53 y.o.   MRN: [id number]  HPI This 53 y.o. male presents for evaluation of uri sx's and sinus pressure.   Review of Systems No chest pain, SOB, HA, dizziness, vision change, N/V, diarrhea, constipation, dysuria, urinary urgency or frequency, myalgias, arthralgias or rash.     Objective:   Physical Exam  Vital signs noted  Well developed well nourished male.  HEENT - Head atraumatic Normocephalic                Eyes - PERRLA, Conjuctiva - clear Sclera- Clear EOMI                Ears - EAC's Wnl TM's Wnl Gross Hearing WNL                Nose - Nares patent                 Throat - oropharanx wnl Respiratory - Lungs CTA bilateral Cardiac - RRR S1 and S2 without murmur GI - Abdomen soft Nontender and bowel sounds active x 4 Extremities - No edema. Neuro - Grossly intact.      Assessment & Plan:  Cough - Plan: POCT CBC, levofloxacin (LEVAQUIN) 500 MG tablet, predniSONE (DELTASONE) 10 MG tablet, HYDROcodone-homatropine (HYCODAN) 5-1.5 MG/5ML syrup, POCT glucose (manual entry)  Chest congestion - Plan: POCT CBC, levofloxacin (LEVAQUIN) 500 MG tablet, predniSONE (DELTASONE) 10 MG tablet, HYDROcodone-homatropine (HYCODAN) 5-1.5 MG/5ML syrup, POCT glucose (manual entry)  Acute bronchitis due to other specified organisms - Plan: levofloxacin (LEVAQUIN) 500 MG tablet, predniSONE (DELTASONE) 10 MG tablet, HYDROcodone-homatropine (HYCODAN) 5-1.5 MG/5ML syrup, POCT glucose (manual entry)  Type 1 diabetes mellitus without complication - Plan: POCT glycosylated hemoglobin (Hb A1C), Lipid panel, CMP14+EGFR  Lysbeth Penner FNP

## 2014-03-29 ENCOUNTER — Telehealth: Payer: Self-pay | Admitting: Family Medicine

## 2014-03-29 MED ORDER — ROSUVASTATIN CALCIUM 10 MG PO TABS: 10.0000 mg | ORAL_TABLET | Freq: Every day | ORAL | Status: DC

## 2014-03-29 MED ORDER — PANTOPRAZOLE SODIUM 20 MG PO TBEC: 20.0000 mg | DELAYED_RELEASE_TABLET | Freq: Every day | ORAL | Status: DC

## 2014-03-29 NOTE — Telephone Encounter (Signed)
done

## 2014-03-30 LAB — LIPID PANEL
Chol/HDL Ratio: 5.3 ratio units — ABNORMAL HIGH (ref 0.0–5.0)
Cholesterol, Total: 202 mg/dL — ABNORMAL HIGH (ref 100–199)
HDL: 38 mg/dL — ABNORMAL LOW (ref >39)
LDL Calculated: 100 mg/dL — ABNORMAL HIGH (ref 0–99)
Triglycerides: 319 mg/dL — ABNORMAL HIGH (ref 0–149)
VLDL Cholesterol Cal: 64 mg/dL — ABNORMAL HIGH (ref 5–40)

## 2014-03-30 LAB — CMP14+EGFR
ALT: 22 IU/L (ref 0–44)
AST: 24 IU/L (ref 0–40)
Albumin/Globulin Ratio: 1.7 (ref 1.1–2.5)
Albumin: 4.3 g/dL (ref 3.5–5.5)
Alkaline Phosphatase: 24 IU/L — ABNORMAL LOW (ref 39–117)
BUN/Creatinine Ratio: 15 (ref 9–20)
BUN: 21 mg/dL (ref 6–24)
CO2: 21 mmol/L (ref 18–29)
Calcium: 9.4 mg/dL (ref 8.7–10.2)
Chloride: 99 mmol/L (ref 97–108)
Creatinine, Ser: 1.44 mg/dL — ABNORMAL HIGH (ref 0.76–1.27)
GFR calc Af Amer: 64 mL/min/{1.73_m2} (ref >59)
GFR calc non Af Amer: 55 mL/min/{1.73_m2} — ABNORMAL LOW (ref >59)
Globulin, Total: 2.6 g/dL (ref 1.5–4.5)
Glucose: 156 mg/dL — ABNORMAL HIGH (ref 65–99)
Potassium: 4.3 mmol/L (ref 3.5–5.2)
Sodium: 139 mmol/L (ref 134–144)
Total Bilirubin: 0.3 mg/dL (ref 0.0–1.2)
Total Protein: 6.9 g/dL (ref 6.0–8.5)

## 2014-04-04 ENCOUNTER — Other Ambulatory Visit: Payer: Self-pay | Admitting: Family Medicine

## 2014-04-10 DIAGNOSIS — Z79899 Other long term (current) drug therapy: Secondary | ICD-10-CM | POA: Diagnosis not present

## 2014-04-10 DIAGNOSIS — G894 Chronic pain syndrome: Secondary | ICD-10-CM | POA: Diagnosis not present

## 2014-04-10 DIAGNOSIS — F3289 Other specified depressive episodes: Secondary | ICD-10-CM | POA: Diagnosis not present

## 2014-04-10 DIAGNOSIS — E1149 Type 2 diabetes mellitus with other diabetic neurological complication: Secondary | ICD-10-CM | POA: Diagnosis not present

## 2014-04-10 DIAGNOSIS — F329 Major depressive disorder, single episode, unspecified: Secondary | ICD-10-CM | POA: Diagnosis not present

## 2014-04-10 DIAGNOSIS — M47817 Spondylosis without myelopathy or radiculopathy, lumbosacral region: Secondary | ICD-10-CM | POA: Diagnosis not present

## 2014-04-16 ENCOUNTER — Other Ambulatory Visit: Payer: Self-pay | Admitting: Family Medicine

## 2014-04-17 ENCOUNTER — Other Ambulatory Visit: Payer: Self-pay | Admitting: Family Medicine

## 2014-04-17 DIAGNOSIS — Z0289 Encounter for other administrative examinations: Secondary | ICD-10-CM

## 2014-04-22 ENCOUNTER — Other Ambulatory Visit: Payer: Self-pay | Admitting: *Deleted

## 2014-04-22 ENCOUNTER — Other Ambulatory Visit: Payer: Self-pay | Admitting: Family Medicine

## 2014-04-22 DIAGNOSIS — E039 Hypothyroidism, unspecified: Secondary | ICD-10-CM

## 2014-04-22 MED ORDER — LEVOTHYROXINE SODIUM 150 MCG PO TABS: 150.0000 ug | ORAL_TABLET | Freq: Every day | ORAL | Status: DC

## 2014-04-22 MED ORDER — GLUCOSE BLOOD VI STRP: ORAL_STRIP | Status: DC

## 2014-04-22 MED ORDER — RIZATRIPTAN BENZOATE 10 MG PO TABS: 10.0000 mg | ORAL_TABLET | ORAL | Status: DC | PRN

## 2014-04-22 NOTE — Telephone Encounter (Signed)
Maxalt is okay x1

## 2014-04-22 NOTE — Telephone Encounter (Signed)
Requesting refill on levothyroxine and maxalt. [facility] Also questioned fenofibrate. Explained that this was sent to mail order on 9/30 and we have a confirmation receipt. Levothyroxine refilled. Patient aware that he needs to schedule appt to establish care with another provider.  Can you refill Maxalt?

## 2014-04-26 ENCOUNTER — Other Ambulatory Visit: Payer: Self-pay | Admitting: *Deleted

## 2014-04-26 DIAGNOSIS — E109 Type 1 diabetes mellitus without complications: Secondary | ICD-10-CM

## 2014-04-26 MED ORDER — GLUCOSE BLOOD VI STRP: ORAL_STRIP | Status: DC

## 2014-04-26 MED ORDER — "INSULIN SYRINGE-NEEDLE U-100 30G X 1/2"" 0.5 ML MISC": Status: DC

## 2014-05-03 ENCOUNTER — Other Ambulatory Visit: Payer: Self-pay

## 2014-05-07 DIAGNOSIS — F329 Major depressive disorder, single episode, unspecified: Secondary | ICD-10-CM | POA: Diagnosis not present

## 2014-05-08 DIAGNOSIS — E1142 Type 2 diabetes mellitus with diabetic polyneuropathy: Secondary | ICD-10-CM | POA: Diagnosis not present

## 2014-05-08 DIAGNOSIS — M47817 Spondylosis without myelopathy or radiculopathy, lumbosacral region: Secondary | ICD-10-CM | POA: Diagnosis not present

## 2014-05-08 DIAGNOSIS — K5900 Constipation, unspecified: Secondary | ICD-10-CM | POA: Diagnosis not present

## 2014-05-08 DIAGNOSIS — G894 Chronic pain syndrome: Secondary | ICD-10-CM | POA: Diagnosis not present

## 2014-05-09 ENCOUNTER — Telehealth: Payer: Self-pay | Admitting: Family Medicine

## 2014-05-09 NOTE — Telephone Encounter (Signed)
Give triage call

## 2014-05-10 ENCOUNTER — Other Ambulatory Visit: Payer: Self-pay | Admitting: *Deleted

## 2014-05-10 ENCOUNTER — Encounter: Payer: Self-pay | Admitting: Family Medicine

## 2014-05-10 ENCOUNTER — Ambulatory Visit (INDEPENDENT_AMBULATORY_CARE_PROVIDER_SITE_OTHER): Payer: Medicare Other | Admitting: Family Medicine

## 2014-05-10 VITALS — BP 99/67 | HR 109 | Temp 98.7°F | Ht 74.0 in | Wt 252.0 lb

## 2014-05-10 DIAGNOSIS — R05 Cough: Secondary | ICD-10-CM | POA: Diagnosis not present

## 2014-05-10 DIAGNOSIS — K5909 Other constipation: Secondary | ICD-10-CM | POA: Diagnosis not present

## 2014-05-10 DIAGNOSIS — J208 Acute bronchitis due to other specified organisms: Secondary | ICD-10-CM

## 2014-05-10 DIAGNOSIS — R0989 Other specified symptoms and signs involving the circulatory and respiratory systems: Secondary | ICD-10-CM | POA: Diagnosis not present

## 2014-05-10 DIAGNOSIS — R059 Cough, unspecified: Secondary | ICD-10-CM

## 2014-05-10 DIAGNOSIS — E109 Type 1 diabetes mellitus without complications: Secondary | ICD-10-CM

## 2014-05-10 MED ORDER — LUBIPROSTONE 24 MCG PO CAPS: 24.0000 ug | ORAL_CAPSULE | Freq: Two times a day (BID) | ORAL | Status: DC

## 2014-05-10 MED ORDER — INSULIN LISPRO PROT & LISPRO (75-25 MIX) 100 UNIT/ML ~~LOC~~ SUSP: SUBCUTANEOUS | Status: DC

## 2014-05-10 MED ORDER — LEVOFLOXACIN 500 MG PO TABS: 500.0000 mg | ORAL_TABLET | Freq: Every day | ORAL | Status: DC

## 2014-05-10 MED ORDER — PREDNISONE 10 MG PO TABS: ORAL_TABLET | ORAL | Status: DC

## 2014-05-10 MED ORDER — INSULIN ASPART PROT & ASPART (70-30 MIX) 100 UNIT/ML ~~LOC~~ SUSP: SUBCUTANEOUS | Status: DC

## 2014-05-10 NOTE — Progress Notes (Signed)
   Subjective:    Patient ID: Joseph [name], male    DOB: 1960/07/25, 53 y.o.   MRN: [id number]  HPI Patient has been having cough and wheezing return.  He was recently tx'd for copd exacerbation and was feeling better for about a week then he started to have a lot of SOB, wheezing, and mucopurulent respiratory secretions.  He is using his nebulizer.  He is having problems with constipation.  He is needing refills on inuslin.  Review of Systems  Constitutional: Negative for fever.  HENT: Negative for ear pain.   Eyes: Negative for discharge.  Respiratory: Positive for cough, shortness of breath and wheezing.   Cardiovascular: Negative for chest pain.  Gastrointestinal: Negative for abdominal distention.  Endocrine: Negative for polyuria.  Genitourinary: Negative for difficulty urinating.  Musculoskeletal: Negative for gait problem and neck pain.  Skin: Negative for color change and rash.  Neurological: Negative for speech difficulty and headaches.  Psychiatric/Behavioral: Negative for agitation.       Objective:    BP 99/67  Pulse 109  Temp(Src) 98.7 F (37.1 C) (Oral)  Ht 6\' 2"  (1.88 m)  Wt 252 lb (114.306 kg)  BMI 32.34 kg/m2 Physical Exam  Constitutional: He is oriented to person, place, and time. He appears well-developed and well-nourished.  HENT:  Head: Normocephalic and atraumatic.  Mouth/Throat: Oropharynx is clear and moist.  Eyes: Pupils are equal, round, and reactive to light.  Neck: Normal range of motion. Neck supple.  Cardiovascular: Normal rate and regular rhythm.   No murmur heard. Pulmonary/Chest: Effort normal. He has wheezes.  Abdominal: Soft. Bowel sounds are normal. There is no tenderness.  Neurological: He is alert and oriented to person, place, and time.  Skin: Skin is warm and dry.  Psychiatric: He has a normal mood and affect.          Assessment & Plan:     ICD-9-CM ICD-10-CM   1. Cough 786.2 R05 levofloxacin (LEVAQUIN) 500 MG tablet   2. Chest congestion 786.9 R09.89 levofloxacin (LEVAQUIN) 500 MG tablet  3. Acute bronchitis due to other specified organisms 466.0 J20.8 levofloxacin (LEVAQUIN) 500 MG tablet     insulin lispro protamine-lispro (HUMALOG 75/25 MIX) (75-25) 100 UNIT/ML SUSP injection     predniSONE (DELTASONE) 10 MG tablet  4. Other constipation 564.09 K59.09 lubiprostone (AMITIZA) 24 MCG capsule     Return if symptoms worsen or fail to improve.  Lysbeth Penner FNP

## 2014-05-29 ENCOUNTER — Telehealth: Payer: Self-pay | Admitting: Family Medicine

## 2014-06-05 DIAGNOSIS — E1142 Type 2 diabetes mellitus with diabetic polyneuropathy: Secondary | ICD-10-CM | POA: Diagnosis not present

## 2014-06-05 DIAGNOSIS — M47817 Spondylosis without myelopathy or radiculopathy, lumbosacral region: Secondary | ICD-10-CM | POA: Diagnosis not present

## 2014-06-05 DIAGNOSIS — G894 Chronic pain syndrome: Secondary | ICD-10-CM | POA: Diagnosis not present

## 2014-06-05 DIAGNOSIS — K5900 Constipation, unspecified: Secondary | ICD-10-CM | POA: Diagnosis not present

## 2014-06-10 ENCOUNTER — Other Ambulatory Visit: Payer: Self-pay | Admitting: Family Medicine

## 2014-06-11 NOTE — Telephone Encounter (Signed)
Last ov 05/10/14.

## 2014-06-17 ENCOUNTER — Other Ambulatory Visit: Payer: Self-pay

## 2014-06-17 MED ORDER — ROSUVASTATIN CALCIUM 10 MG PO TABS: 10.0000 mg | ORAL_TABLET | Freq: Every day | ORAL | Status: DC

## 2014-06-17 MED ORDER — BUDESONIDE-FORMOTEROL FUMARATE 160-4.5 MCG/ACT IN AERO: INHALATION_SPRAY | RESPIRATORY_TRACT | Status: DC

## 2014-06-19 ENCOUNTER — Other Ambulatory Visit: Payer: Self-pay | Admitting: Family Medicine

## 2014-06-19 ENCOUNTER — Telehealth: Payer: Self-pay | Admitting: Family Medicine

## 2014-06-19 DIAGNOSIS — R0989 Other specified symptoms and signs involving the circulatory and respiratory systems: Secondary | ICD-10-CM

## 2014-06-19 DIAGNOSIS — R059 Cough, unspecified: Secondary | ICD-10-CM

## 2014-06-19 DIAGNOSIS — J208 Acute bronchitis due to other specified organisms: Secondary | ICD-10-CM

## 2014-06-19 DIAGNOSIS — R05 Cough: Secondary | ICD-10-CM

## 2014-06-19 MED ORDER — LEVOFLOXACIN 500 MG PO TABS: 500.0000 mg | ORAL_TABLET | Freq: Every day | ORAL | Status: DC

## 2014-06-19 MED ORDER — HYDROCODONE-HOMATROPINE 5-1.5 MG/5ML PO SYRP: 5.0000 mL | ORAL_SOLUTION | Freq: Three times a day (TID) | ORAL | Status: DC | PRN

## 2014-06-19 MED ORDER — PREDNISONE 10 MG PO TABS: ORAL_TABLET | ORAL | Status: DC

## 2014-06-19 NOTE — Telephone Encounter (Signed)
Does the pt ntbs? Please advise.

## 2014-06-19 NOTE — Telephone Encounter (Signed)
I refilled his prednisone and levaquin and he needs to come in and pick up his cough syrup

## 2014-06-20 ENCOUNTER — Telehealth: Payer: Self-pay | Admitting: Family Medicine

## 2014-06-20 NOTE — Telephone Encounter (Signed)
Meds were sent to mail order by Dietrich Pates. [facility]. Canceled med for Pred and Levaquin mail order with Associate Jeneen Rinks and [facility] request. Pt does not won't Rx for cough med because he said he would have to pay for it.

## 2014-06-21 NOTE — Telephone Encounter (Signed)
Pt aware and declines rx for cough syrup as he would have to pay for it so he doesn't want it.Will close call.

## 2014-07-04 DIAGNOSIS — M47817 Spondylosis without myelopathy or radiculopathy, lumbosacral region: Secondary | ICD-10-CM | POA: Diagnosis not present

## 2014-07-04 DIAGNOSIS — M7711 Lateral epicondylitis, right elbow: Secondary | ICD-10-CM | POA: Diagnosis not present

## 2014-07-04 DIAGNOSIS — G894 Chronic pain syndrome: Secondary | ICD-10-CM | POA: Diagnosis not present

## 2014-07-04 DIAGNOSIS — K5900 Constipation, unspecified: Secondary | ICD-10-CM | POA: Diagnosis not present

## 2014-07-04 DIAGNOSIS — E1142 Type 2 diabetes mellitus with diabetic polyneuropathy: Secondary | ICD-10-CM | POA: Diagnosis not present

## 2014-07-04 DIAGNOSIS — Z79891 Long term (current) use of opiate analgesic: Secondary | ICD-10-CM | POA: Diagnosis not present

## 2014-07-08 ENCOUNTER — Other Ambulatory Visit: Payer: Self-pay | Admitting: Family Medicine

## 2014-07-08 ENCOUNTER — Other Ambulatory Visit: Payer: Self-pay | Admitting: *Deleted

## 2014-07-08 DIAGNOSIS — E109 Type 1 diabetes mellitus without complications: Secondary | ICD-10-CM

## 2014-07-08 MED ORDER — INSULIN ASPART PROT & ASPART (70-30 MIX) 100 UNIT/ML ~~LOC~~ SUSP: SUBCUTANEOUS | Status: DC

## 2014-07-11 ENCOUNTER — Other Ambulatory Visit: Payer: Self-pay | Admitting: Family Medicine

## 2014-07-19 ENCOUNTER — Other Ambulatory Visit: Payer: Self-pay | Admitting: Family Medicine

## 2014-07-23 ENCOUNTER — Telehealth: Payer: Self-pay | Admitting: Family Medicine

## 2014-07-26 NOTE — Telephone Encounter (Signed)
Stp's wife, pt given appt Monday morning with Dietrich Pates regarding ?URI, advised to remind him to schedule his diabetes follow up while he is in the office.

## 2014-07-29 ENCOUNTER — Other Ambulatory Visit: Payer: Self-pay | Admitting: Nurse Practitioner

## 2014-07-29 ENCOUNTER — Ambulatory Visit (INDEPENDENT_AMBULATORY_CARE_PROVIDER_SITE_OTHER): Payer: Medicare Other | Admitting: Family Medicine

## 2014-07-29 ENCOUNTER — Ambulatory Visit: Payer: Medicare Other | Admitting: Family Medicine

## 2014-07-29 VITALS — BP 93/64 | HR 115 | Temp 99.4°F | Ht 74.0 in | Wt 264.0 lb

## 2014-07-29 DIAGNOSIS — R0989 Other specified symptoms and signs involving the circulatory and respiratory systems: Secondary | ICD-10-CM | POA: Diagnosis not present

## 2014-07-29 DIAGNOSIS — R05 Cough: Secondary | ICD-10-CM | POA: Diagnosis not present

## 2014-07-29 DIAGNOSIS — J208 Acute bronchitis due to other specified organisms: Secondary | ICD-10-CM

## 2014-07-29 DIAGNOSIS — R059 Cough, unspecified: Secondary | ICD-10-CM

## 2014-07-29 MED ORDER — ALBUTEROL SULFATE HFA 108 (90 BASE) MCG/ACT IN AERS: 2.0000 | INHALATION_SPRAY | Freq: Four times a day (QID) | RESPIRATORY_TRACT | Status: DC | PRN

## 2014-07-29 MED ORDER — LEVOFLOXACIN 500 MG PO TABS: 500.0000 mg | ORAL_TABLET | Freq: Every day | ORAL | Status: DC

## 2014-07-29 MED ORDER — ALBUTEROL SULFATE (2.5 MG/3ML) 0.083% IN NEBU: INHALATION_SOLUTION | RESPIRATORY_TRACT | Status: DC

## 2014-07-29 MED ORDER — INSULIN LISPRO PROT & LISPRO (75-25 MIX) 100 UNIT/ML ~~LOC~~ SUSP: SUBCUTANEOUS | Status: DC

## 2014-07-29 MED ORDER — PREDNISONE 10 MG PO TABS: ORAL_TABLET | ORAL | Status: DC

## 2014-07-29 NOTE — Progress Notes (Signed)
   Subjective:    Patient ID: Joseph [name], male    DOB: [birth year], 54 y.o.   MRN: [id number]  HPI Patient is here for c/o uri sx's and persistent cough.  H has hx of COPD and he uses spiriva.  He needs refill on his albuterol.  He needs refill on humalog insulin.   Review of Systems  Constitutional: Negative for fever.  HENT: Negative for ear pain.   Eyes: Negative for discharge.  Respiratory: Negative for cough.   Cardiovascular: Negative for chest pain.  Gastrointestinal: Negative for abdominal distention.  Endocrine: Negative for polyuria.  Genitourinary: Negative for difficulty urinating.  Musculoskeletal: Negative for gait problem and neck pain.  Skin: Negative for color change and rash.  Neurological: Negative for speech difficulty and headaches.  Psychiatric/Behavioral: Negative for agitation.       Objective:    BP 93/64 mmHg  Pulse 115  Temp(Src) 99.4 F (37.4 C) (Oral)  Ht 6\' 2"  (1.88 m)  Wt 264 lb (119.75 kg)  BMI 33.88 kg/m2 Physical Exam  Constitutional: He is oriented to person, place, and time. He appears well-developed and well-nourished.  HENT:  Head: Normocephalic and atraumatic.  Mouth/Throat: Oropharynx is clear and moist.  Eyes: Pupils are equal, round, and reactive to light.  Neck: Normal range of motion. Neck supple.  Cardiovascular: Normal rate and regular rhythm.   No murmur heard. Pulmonary/Chest: Effort normal and breath sounds normal.  Abdominal: Soft. Bowel sounds are normal. There is no tenderness.  Neurological: He is alert and oriented to person, place, and time.  Skin: Skin is warm and dry.  Psychiatric: He has a normal mood and affect.          Assessment & Plan:     ICD-9-CM ICD-10-CM   1. Acute bronchitis due to other specified organisms 466.0 J20.8 levofloxacin (LEVAQUIN) 500 MG tablet     predniSONE (DELTASONE) 10 MG tablet     albuterol (PROVENTIL HFA;VENTOLIN HFA) 108 (90 BASE) MCG/ACT inhaler     albuterol  (PROVENTIL) (2.5 MG/3ML) 0.083% nebulizer solution     insulin lispro protamine-lispro (HUMALOG 75/25 MIX) (75-25) 100 UNIT/ML SUSP injection  2. Cough 786.2 R05 levofloxacin (LEVAQUIN) 500 MG tablet     albuterol (PROVENTIL) (2.5 MG/3ML) 0.083% nebulizer solution  3. Chest congestion 786.9 R09.89 levofloxacin (LEVAQUIN) 500 MG tablet     albuterol (PROVENTIL) (2.5 MG/3ML) 0.083% nebulizer solution   Push po fluids, rest, tylenol and motrin otc prn as directed for fever, arthralgias, and myalgias.  Follow up prn if sx's continue or persist.  No Follow-up on file.  Lysbeth Penner FNP

## 2014-07-30 ENCOUNTER — Other Ambulatory Visit: Payer: Self-pay | Admitting: *Deleted

## 2014-08-01 DIAGNOSIS — E1142 Type 2 diabetes mellitus with diabetic polyneuropathy: Secondary | ICD-10-CM | POA: Diagnosis not present

## 2014-08-01 DIAGNOSIS — G894 Chronic pain syndrome: Secondary | ICD-10-CM | POA: Diagnosis not present

## 2014-08-01 DIAGNOSIS — K5900 Constipation, unspecified: Secondary | ICD-10-CM | POA: Diagnosis not present

## 2014-08-01 DIAGNOSIS — M47817 Spondylosis without myelopathy or radiculopathy, lumbosacral region: Secondary | ICD-10-CM | POA: Diagnosis not present

## 2014-08-14 ENCOUNTER — Encounter: Payer: Self-pay | Admitting: Family Medicine

## 2014-08-14 ENCOUNTER — Ambulatory Visit (INDEPENDENT_AMBULATORY_CARE_PROVIDER_SITE_OTHER): Payer: Medicare Other | Admitting: Family Medicine

## 2014-08-14 VITALS — BP 93/66 | HR 104 | Temp 97.8°F | Ht 74.0 in | Wt 267.0 lb

## 2014-08-14 DIAGNOSIS — M5106 Intervertebral disc disorders with myelopathy, lumbar region: Secondary | ICD-10-CM | POA: Diagnosis not present

## 2014-08-14 DIAGNOSIS — J441 Chronic obstructive pulmonary disease with (acute) exacerbation: Secondary | ICD-10-CM | POA: Diagnosis not present

## 2014-08-14 DIAGNOSIS — E109 Type 1 diabetes mellitus without complications: Secondary | ICD-10-CM

## 2014-08-14 DIAGNOSIS — J208 Acute bronchitis due to other specified organisms: Secondary | ICD-10-CM

## 2014-08-14 LAB — POCT GLYCOSYLATED HEMOGLOBIN (HGB A1C): Hemoglobin A1C: 7.3

## 2014-08-14 MED ORDER — BETAMETHASONE SOD PHOS & ACET 6 (3-3) MG/ML IJ SUSP
6.0000 mg | Freq: Once | INTRAMUSCULAR | Status: AC
  Administered 2014-08-14: 6 mg via INTRAMUSCULAR

## 2014-08-14 MED ORDER — GABAPENTIN 300 MG PO CAPS: 900.0000 mg | ORAL_CAPSULE | Freq: Two times a day (BID) | ORAL | Status: DC

## 2014-08-14 MED ORDER — ALBUTEROL SULFATE (2.5 MG/3ML) 0.083% IN NEBU: INHALATION_SOLUTION | RESPIRATORY_TRACT | Status: DC

## 2014-08-14 MED ORDER — GABAPENTIN 300 MG PO CAPS: 600.0000 mg | ORAL_CAPSULE | Freq: Two times a day (BID) | ORAL | Status: DC

## 2014-08-14 MED ORDER — ALBUTEROL SULFATE HFA 108 (90 BASE) MCG/ACT IN AERS: 2.0000 | INHALATION_SPRAY | Freq: Four times a day (QID) | RESPIRATORY_TRACT | Status: DC | PRN

## 2014-08-14 NOTE — Progress Notes (Signed)
Subjective:    Patient ID: Joseph [name], male    DOB: 13-Mar-1961, 54 y.o.   MRN: [id number]  HPI patient in today stating that he checks his sugar be sometimes in the morning sometimes at bedtime. He says that he feels really bad when he sugar goes too high. He had gone the hospital once when he went over 500. When it happened. However that's been some time ago and he has not had any recurrence. He denies hypoglycemia. He denies any vision changes. He denies any edema. He does have some numbness and pain in his legs due to ongoing low back pain. He sees pain clinic in [place]. They are prescribing his narcotic-containing medicines as noted below. The pain does not seem to be adequately relieved. He has increased his gabapentin since last evaluation  To 2 tablets twice daily.   Joseph [name] is a 54 y.o. male who complains of congestion, post nasal drip and productive cough for 5 days. He denies a history of dizziness, nausea, shortness of breath and vomiting. He denies a history of asthma. Patient does not smoke cigarettes. 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   Review of Systems  Constitutional: Negative for fever, chills, diaphoresis and unexpected weight change.  HENT: Negative for congestion, hearing loss, rhinorrhea, sore throat and trouble swallowing.   Respiratory: Positive for cough, chest tightness, wheezing and stridor. Negative for shortness of breath.        See HPI  Cardiovascular: Negative for chest pain, palpitations and leg swelling.  Gastrointestinal: Negative for nausea, vomiting, abdominal pain, diarrhea, constipation and abdominal distention.  Endocrine: Negative for cold intolerance and heat intolerance.  Genitourinary: Negative for dysuria, hematuria and flank pain.  Musculoskeletal: Negative for joint swelling and arthralgias.  Skin: Negative for rash.  Neurological: Negative for dizziness and headaches.  Psychiatric/Behavioral: Negative  for dysphoric mood, decreased concentration and agitation. The patient is not nervous/anxious.        No Known Allergies  Outpatient Encounter Prescriptions as of 08/14/2014  Medication Sig  . albuterol (PROVENTIL HFA;VENTOLIN HFA) 108 (90 BASE) MCG/ACT inhaler Inhale 2 puffs into the lungs every 6 (six) hours as needed.  Marland Kitchen albuterol (PROVENTIL) (2.5 MG/3ML) 0.083% nebulizer solution 1 vial in nebulizer four times daily  . B-D INS SYRINGE 0.5CC/31GX5/16 31G X 5/16" 0.5 ML MISC USE TWICE DAILY TO ADMINISTER INSULIN  . budesonide-formoterol (SYMBICORT) 160-4.5 MCG/ACT inhaler INHALE 2 PUFFS INTO THE LUNGS 2 (TWO) TIMES DAILY.  . budesonide-formoterol (SYMBICORT) 160-4.5 MCG/ACT inhaler Use 2 puffs in the morning  and evening  . cyclobenzaprine (FLEXERIL) 5 MG tablet Take 5 mg by mouth 3 (three) times daily as needed for muscle spasms.  . diazepam (VALIUM) 2 MG tablet Take 2 mg by mouth every 6 (six) hours as needed for anxiety.  . fenofibrate 54 MG tablet TAKE 1 TABLET BY MOUTH EVERY DAY  . gabapentin (NEURONTIN) 300 MG capsule Take 1 capsule (300 mg total) by mouth 3 (three) times daily.  Marland Kitchen glucose blood (ACCU-CHEK AVIVA) test strip Test blood sugar before meals and at bedtime  . HYDROcodone-homatropine (HYCODAN) 5-1.5 MG/5ML syrup Take 5 mLs by mouth every 8 (eight) hours as needed for cough.  . insulin aspart protamine- aspart (NOVOLOG MIX 70/30) (70-30) 100 UNIT/ML injection INJECT 33 UNITS EVERY MORNING AND 33 UNITS EVERY EVENING  . insulin lispro protamine-lispro (HUMALOG 75/25 MIX) (75-25) 100 UNIT/ML SUSP injection 33 units sq in am and 38 units in  pm  . ipratropium (ATROVENT) 0.02 % nebulizer solution 1 vial in nebulizer four times daily,   DX:  496  . ipratropium-albuterol (DUONEB) 0.5-2.5 (3) MG/3ML SOLN Take 3 mLs by nebulization 4 (four) times daily.  Marland Kitchen levothyroxine (SYNTHROID, LEVOTHROID) 150 MCG tablet TAKE 1 TABLET (150 MCG TOTAL) BY MOUTH DAILY.  Marland Kitchen lubiprostone (AMITIZA) 24 MCG  capsule Take 1 capsule (24 mcg total) by mouth 2 (two) times daily with a meal.  . morphine (KADIAN) 60 MG 24 hr capsule Take 60 mg by mouth daily.    Marland Kitchen oxyCODONE-acetaminophen (PERCOCET) 7.5-325 MG per tablet Take 1 tablet by mouth every 6 (six) hours as needed.    . pantoprazole (PROTONIX) 20 MG tablet TAKE 1 TABLET (20 MG TOTAL) BY MOUTH DAILY.  Marland Kitchen polyethylene glycol powder (GLYCOLAX/MIRALAX) powder Take 255 g (1 Container total) by mouth once. Daily prn for constipation. Up to 1 week.  Marland Kitchen QUEtiapine (SEROQUEL XR) 300 MG 24 hr tablet Take 300 mg by mouth at bedtime.  . rizatriptan (MAXALT) 10 MG tablet TAKE 1 TABLET (10 MG TOTAL) BY MOUTH AS NEEDED. MAY REPEAT IN 2 HOURS IF NEEDED  . rosuvastatin (CRESTOR) 10 MG tablet Take 1 tablet (10 mg total) by mouth daily.  . sertraline (ZOLOFT) 100 MG tablet Take 100 mg by mouth 2 (two) times daily.  . Tamsulosin HCl (FLOMAX) 0.4 MG CAPS Take 0.4 mg by mouth daily.    . traZODone (DESYREL) 100 MG tablet   . [DISCONTINUED] fenofibrate 54 MG tablet Take 1 tablet by mouth  every day  . [DISCONTINUED] levofloxacin (LEVAQUIN) 500 MG tablet Take 1 tablet (500 mg total) by mouth daily.  . [DISCONTINUED] levothyroxine (SYNTHROID, LEVOTHROID) 150 MCG tablet TAKE 1 TABLET (150 MCG TOTAL) BY MOUTH DAILY.  . [DISCONTINUED] predniSONE (DELTASONE) 10 MG tablet Take 4 po qd x 2 days then 2 po qd x 2 days then 1po qd x 2 days  . [DISCONTINUED] predniSONE (DELTASONE) 10 MG tablet 4 po qd x 3 days then 3 po qd x 3 days then 2 po qd x 3 days then 1 po qd x 3 days then stop    Past Medical History  Diagnosis Date  . Diabetes mellitus   . Hyperlipidemia   . Weight gain     After quitting smoking in 2005  . COPD (chronic obstructive pulmonary disease)     AB clinical dx; HFA 75% 02/28/10 > 90% Sept 21, 2011  . Anxiety     Past Surgical History  Procedure Laterality Date  . 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 Knee arthroscopy      right     History   Social History  . Marital Status: Married    Spouse Name: N/A    Number of Children: N/A  . Years of Education: N/A   Occupational History  . Not on file.   Social History Main Topics  . Smoking status: Former Smoker -- 3.00 packs/day for 30 years    Quit date: 07/20/2003  . Smokeless tobacco: Not on file  . Alcohol Use: No  . Drug Use: Not on file  . Sexual Activity: Not on file   Other Topics Concern  . Not on file   Social History Narrative   Married with children   Objective:   Physical Exam  Constitutional: He is oriented to person, place, and time. He appears well-developed and well-nourished. No distress.  HENT:  Head: Normocephalic and  atraumatic.  Right Ear: External ear normal.  Left Ear: External ear normal.  Nose: Nose normal.  Mouth/Throat: Oropharynx is clear and moist.  Eyes: Conjunctivae and EOM are normal. Pupils are equal, round, and reactive to light.  Neck: Normal range of motion. Neck supple. No thyromegaly present.  Cardiovascular: Normal rate, regular rhythm and normal heart sounds.   No murmur heard. Pulmonary/Chest: Effort normal. No respiratory distress. He has wheezes (moderate transmitted upper respiratory noise due to stoma. However some wheezing is suspected.).  Abdominal: Soft. Bowel sounds are normal. He exhibits no distension. There is no tenderness.  Lymphadenopathy:    He has no cervical adenopathy.  Neurological: He is alert and oriented to person, place, and time. He has normal reflexes.  Skin: Skin is warm and dry.  Psychiatric: He has a normal mood and affect. His behavior is normal. Judgment and thought content normal.          Assessment & Plan:    1. Type 1 diabetes mellitus without complication   2. Obstructive chronic bronchitis with exacerbation   3. Acute bronchitis due to other specified organisms   4. 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 betamethasone acetate-betamethasone sodium phosphate (CELESTONE) injection 6 mg    Sig:   . DISCONTD: gabapentin (NEURONTIN) 300 MG capsule    Sig: Take 2 capsules (600 mg total) by mouth 2 (two) times daily.    Dispense:  10 capsule    Refill:  0  . gabapentin (NEURONTIN) 300 MG capsule    Sig: Take 3 capsules (900 mg total) by mouth 2 (two) times daily.    Dispense:  180 capsule    Refill:  0  . DISCONTD: albuterol (PROVENTIL) (2.5 MG/3ML) 0.083% nebulizer solution    Sig: 1 vial in nebulizer four times daily    Dispense:  120 mL    Refill:  11  . albuterol (PROVENTIL HFA;VENTOLIN HFA) 108 (90 BASE) MCG/ACT inhaler    Sig: Inhale 2 puffs into the lungs every 6 (six) hours as needed for wheezing.    Dispense:  1 Inhaler    Refill:  3    DX  J44.1 DX  J20.8  . albuterol (PROVENTIL) (2.5 MG/3ML) 0.083% nebulizer solution    Sig: 1 vial in nebulizer four times daily    Dispense:  120 mL    Refill:  11    DX   J44.1 DX   J20.8    Orders Placed This Encounter  Procedures  . POCT glycosylated hemoglobin (Hb A1C)    Labs pending Health Maintenance reviewed Diet and exercise encouraged Continue all meds as discussed Follow up in 3 mo  Claretta Fraise, MD

## 2014-08-14 NOTE — Patient Instructions (Addendum)
Basic Carbohydrate Counting for Diabetes Mellitus Carbohydrate counting is a method for keeping track of the amount of carbohydrates you eat. Eating carbohydrates naturally increases the level of sugar (glucose) in your blood, so it is important for you to know the amount that is okay for you to have in every meal. Carbohydrate counting helps keep the level of glucose in your blood within normal limits. The amount of carbohydrates allowed is different for every person. A dietitian can help you calculate the amount that is right for you. Once you know the amount of carbohydrates you can have, you can count the carbohydrates in the foods you want to eat. Carbohydrates are found in the following foods:  Grains, such as breads and cereals.  Dried beans and soy products.  Starchy vegetables, such as potatoes, peas, and corn.  Fruit and fruit juices.  Milk and yogurt.  Sweets and snack foods, such as cake, cookies, candy, chips, soft drinks, and fruit drinks. CARBOHYDRATE COUNTING There are two ways to count the carbohydrates in your food. You can use either of the methods or a combination of both. Reading the "Nutrition Facts" on [place] The "Nutrition Facts" is an area that is included on the labels of almost all packaged Blood Glucose Monitoring Monitoring your blood glucose (also know as blood sugar) helps you to manage your diabetes. It also helps you and your health care provider monitor your diabetes and determine how well your treatment plan is working. WHY SHOULD YOU MONITOR YOUR BLOOD GLUCOSE?  It can help you understand how food, exercise, and medicine affect your blood glucose.  It allows you to know what your blood glucose is at any given moment. You can quickly tell if you are having low blood glucose (hypoglycemia) or high blood glucose (hyperglycemia).  It can help you and your health care provider know how to adjust your medicines.  It can help you understand how to  manage an illness or adjust medicine for exercise. WHEN SHOULD YOU TEST? Your health care provider will help you decide how often you should check your blood glucose. This may depend on the type of diabetes you have, your diabetes control, or the types of medicines you are taking. Be sure to write down all of your blood glucose readings so that this information can be reviewed with your health care provider. See below for examples of testing times that your health care provider may suggest. Type 1 Diabetes  Test 4 times a day if you are in good control, using an insulin pump, or perform multiple daily injections.  If your diabetes is not well controlled or if you are sick, you may need to monitor more often.  It is a good idea to also monitor:  Before and after exercise.  Between meals and 2 hours after a meal.  Occasionally between 2:00 a.m. and 3:00 a.m. Type 2 Diabetes  It can vary with each person, but generally, if you are on insulin, test 4 times a day.  If you take medicines by mouth (orally), test 2 times a day.  If you are on a controlled diet, test once a day.  If your diabetes is not well controlled or if you are sick, you may need to monitor more often. HOW TO MONITOR YOUR BLOOD GLUCOSE Supplies Needed  Blood glucose meter.  Test strips for your meter. Each meter has its own strips. You must use the strips that go with your own meter.  A pricking needle (lancet).  A device that holds the lancet (lancing device).  A journal or log book to write down your results. Procedure  Wash your hands with soap and water. Alcohol is not preferred.  Prick the side of your finger (not the tip) with the lancet.  Gently milk the finger until a small drop of blood appears.  Follow the instructions that come with your meter for inserting the test strip, applying blood to the strip, and using your blood glucose meter. Other Areas to Get Blood for Testing Some meters allow you  to use other areas of your body (other than your finger) to test your blood. These areas are called alternative sites. The most common alternative sites are:  The forearm.  The thigh.  The back area of the lower leg.  The palm of the hand. The blood flow in these areas is slower. Therefore, the blood glucose values you get may be delayed, and the numbers are different from what you would get from your fingers. Do not use alternative sites if you think you are having hypoglycemia. Your reading will not be accurate. Always use a finger if you are having hypoglycemia. Also, if you cannot feel your lows (hypoglycemia unawareness), always use your fingers for your blood glucose checks. ADDITIONAL TIPS FOR GLUCOSE MONITORING  Do not reuse lancets.  Always carry your supplies with you.  All blood glucose meters have a 24-hour "hotline" number to call if you have questions or need help.  Adjust (calibrate) your blood glucose meter with a control solution after finishing a few boxes of strips. BLOOD GLUCOSE RECORD KEEPING It is a good idea to keep a daily record or log of your blood glucose readings. Most glucose meters, if not all, keep your glucose records stored in the meter. Some meters come with the ability to download your records to your home computer. Keeping a record of your blood glucose readings is especially helpful if you are wanting to look for patterns. Make notes to go along with the blood glucose readings because you might forget what happened at that exact time. Keeping good records helps you and your health care provider to work together to achieve good diabetes management.  Document Released: 07/08/2003 Document Revised: 11/19/2013 Document Reviewed: 11/27/2012 [facility] Patient Information 2015 [place], [place]. This information is not intended to replace advice given to you by your health care provider. Make sure you discuss any questions you have with your health care  provider. Diabetes and Foot Care Diabetes may cause you to have problems because of poor blood supply (circulation) to your feet and legs. This may cause the skin on your feet to become thinner, break easier, and heal more slowly. Your skin may become dry, and the skin may peel and crack. You may also have nerve damage in your legs and feet causing decreased feeling in them. You may not notice minor injuries to your feet that could lead to infections or more serious problems. Taking care of your feet is one of the most important things you can do for yourself.  HOME CARE INSTRUCTIONS  Wear shoes at all times, even in the house. Do not go barefoot. Bare feet are easily injured.  Check your feet daily for blisters, cuts, and redness. If you cannot see the bottom of your feet, use a mirror or ask someone for help.  Wash your feet with warm water (do not use hot water) and mild soap. Then pat your feet and the areas between your toes  until they are completely dry. Do not soak your feet as this can dry your skin.  Apply a moisturizing lotion or petroleum jelly (that does not contain alcohol and is unscented) to the skin on your feet and to dry, brittle toenails. Do not apply lotion between your toes.  Trim your toenails straight across. Do not dig under them or around the cuticle. File the edges of your nails with an emery board or nail file.  Do not cut corns or calluses or try to remove them with medicine.  Wear clean socks or stockings every day. Make sure they are not too tight. Do not wear knee-high stockings since they may decrease blood flow to your legs.  Wear shoes that fit properly and have enough cushioning. To break in new shoes, wear them for just a few hours a day. This prevents you from injuring your feet. Always look in your shoes before you put them on to be sure there are no objects inside.  Do not cross your legs. This may decrease the blood flow to your feet.  If you find a  minor scrape, cut, or break in the skin on your feet, keep it and the skin around it clean and dry. These areas may be cleansed with mild soap and water. Do not cleanse the area with peroxide, alcohol, or iodine.  When you remove an adhesive bandage, be sure not to damage the skin around it.  If you have a wound, look at it several times a day to make sure it is healing.  Do not use heating pads or hot water bottles. They may burn your skin. If you have lost feeling in your feet or legs, you may not know it is happening until it is too late.  Make sure your health care provider performs a complete foot exam at least annually or more often if you have foot problems. Report any cuts, sores, or bruises to your health care provider immediately. SEEK MEDICAL CARE IF:   You have an injury that is not healing.  You have cuts or breaks in the skin.  You have an ingrown nail.  You notice redness on your legs or feet.  You feel burning or tingling in your legs or feet.  You have pain or cramps in your legs and feet.  Your legs or feet are numb.  Your feet always feel cold. SEEK IMMEDIATE MEDICAL CARE IF:   There is increasing redness, swelling, or pain in or around a wound.  There is a red line that goes up your leg.  Pus is coming from a wound.  You develop a fever or as directed by your health care provider.  You notice a bad smell coming from an ulcer or wound. Document Released: 07/02/2000 Document Revised: 03/07/2013 Document Reviewed: 12/12/2012 [facility] Patient Information 2015 [place], [place]. This information is not intended to replace advice given to you by your health care provider. Make sure you discuss any questions you have with your health care provider. and beverages in the [place]. It includes the serving size of that food or beverage and information about the nutrients in each serving of the food, including the grams (g) of carbohydrate per serving.  Decide the  number of servings of this food or beverage that you will be able to eat or drink. Multiply that number of servings by the number of grams of carbohydrate that is listed on the label for that serving. The total will be the  amount of carbohydrates you will be having when you eat or drink this food or beverage. Learning Standard Serving Sizes of Food When you eat food that is not packaged or does not include "Nutrition Facts" on the label, you need to measure the servings in order to count the amount of carbohydrates.A serving of most carbohydrate-rich foods contains about 15 g of carbohydrates. The following list includes serving sizes of carbohydrate-rich foods that provide 15 g ofcarbohydrate per serving:   1 slice of bread (1 oz) or 1 six-inch tortilla.    of a hamburger bun or English muffin.  4-6 crackers.   cup unsweetened dry cereal.    cup hot cereal.   cup rice or pasta.    cup mashed potatoes or  of a large baked potato.  1 cup fresh fruit or one small piece of fruit.    cup canned or frozen fruit or fruit juice.  1 cup milk.   cup plain fat-free yogurt or yogurt sweetened with artificial sweeteners.   cup cooked dried beans or starchy vegetable, such as peas, corn, or potatoes.  Decide the number of standard-size servings that you will eat. Multiply that number of servings by 15 (the grams of carbohydrates in that serving). For example, if you eat 2 cups of strawberries, you will have eaten 2 servings and 30 g of carbohydrates (2 servings x 15 g = 30 g). For foods such as soups and casseroles, in which more than one food is mixed in, you will need to count the carbohydrates in each food that is included. EXAMPLE OF CARBOHYDRATE COUNTING Sample Dinner  3 oz chicken breast.   cup of brown rice.   cup of corn.  1 cup milk.   1 cup strawberries with sugar-free whipped topping.  Carbohydrate Calculation Step 1: Identify the foods that contain  carbohydrates:   Rice.   Corn.   Milk.   Strawberries. Step 2:Calculate the number of servings eaten of each:   2 servings of rice.   1 serving of corn.   1 serving of milk.   1 serving of strawberries. Step 3: Multiply each of those number of servings by 15 g:   2 servings of rice x 15 g = 30 g.   1 serving of corn x 15 g = 15 g.   1 serving of milk x 15 g = 15 g.   1 serving of strawberries x 15 g = 15 g. Step 4: Add together all of the amounts to find the total grams of carbohydrates eaten: 30 g + 15 g + 15 g + 15 g = 75 g. Document Released: 07/05/2005 Document Revised: 11/19/2013 Document Reviewed: 06/01/2013 [facility] Patient Information 2015 [place], [place]. This information is not intended to replace advice given to you by your health care provider. Make sure you discuss any questions you have with your health care provider.   For pain: Try to increase the gabapentin to 3 pills twice daily. No prescription has been sent since you have a laarge supply.

## 2014-08-27 DIAGNOSIS — F329 Major depressive disorder, single episode, unspecified: Secondary | ICD-10-CM | POA: Diagnosis not present

## 2014-09-05 DIAGNOSIS — M47817 Spondylosis without myelopathy or radiculopathy, lumbosacral region: Secondary | ICD-10-CM | POA: Diagnosis not present

## 2014-09-05 DIAGNOSIS — G894 Chronic pain syndrome: Secondary | ICD-10-CM | POA: Diagnosis not present

## 2014-09-05 DIAGNOSIS — K5900 Constipation, unspecified: Secondary | ICD-10-CM | POA: Diagnosis not present

## 2014-09-05 DIAGNOSIS — E1142 Type 2 diabetes mellitus with diabetic polyneuropathy: Secondary | ICD-10-CM | POA: Diagnosis not present

## 2014-09-16 ENCOUNTER — Encounter: Payer: Self-pay | Admitting: Family Medicine

## 2014-09-16 ENCOUNTER — Ambulatory Visit (INDEPENDENT_AMBULATORY_CARE_PROVIDER_SITE_OTHER): Payer: Medicare Other | Admitting: Family Medicine

## 2014-09-16 ENCOUNTER — Ambulatory Visit (INDEPENDENT_AMBULATORY_CARE_PROVIDER_SITE_OTHER): Payer: Medicare Other

## 2014-09-16 VITALS — BP 109/54 | HR 101 | Temp 97.5°F | Ht 74.0 in | Wt 265.0 lb

## 2014-09-16 DIAGNOSIS — R350 Frequency of micturition: Secondary | ICD-10-CM | POA: Diagnosis not present

## 2014-09-16 DIAGNOSIS — R05 Cough: Secondary | ICD-10-CM | POA: Diagnosis not present

## 2014-09-16 DIAGNOSIS — J329 Chronic sinusitis, unspecified: Secondary | ICD-10-CM | POA: Insufficient documentation

## 2014-09-16 DIAGNOSIS — K5901 Slow transit constipation: Secondary | ICD-10-CM | POA: Diagnosis not present

## 2014-09-16 DIAGNOSIS — R0602 Shortness of breath: Secondary | ICD-10-CM

## 2014-09-16 DIAGNOSIS — J40 Bronchitis, not specified as acute or chronic: Secondary | ICD-10-CM

## 2014-09-16 DIAGNOSIS — R059 Cough, unspecified: Secondary | ICD-10-CM

## 2014-09-16 LAB — POCT URINALYSIS DIPSTICK
Blood, UA: NEGATIVE
Glucose, UA: NEGATIVE
LEUKOCYTES UA: NEGATIVE
NITRITE UA: NEGATIVE
PH UA: 6
Spec Grav, UA: >=1.03
Urobilinogen, UA: NEGATIVE

## 2014-09-16 LAB — POCT UA - MICROSCOPIC ONLY
Casts, Ur, LPF, POC: NEGATIVE
EPITHELIAL CELLS, URINE PER MICROSCOPY: NEGATIVE
RBC, URINE, MICROSCOPIC: NEGATIVE
Yeast, UA: NEGATIVE

## 2014-09-16 MED ORDER — LINACLOTIDE 290 MCG PO CAPS: 290.0000 ug | ORAL_CAPSULE | Freq: Every day | ORAL | Status: DC

## 2014-09-16 MED ORDER — LEVOFLOXACIN 500 MG PO TABS: 500.0000 mg | ORAL_TABLET | Freq: Every day | ORAL | Status: DC

## 2014-09-16 NOTE — Patient Instructions (Signed)
For constipation use Metamucil 2 tablespoons twice daily every day. Also use Colace 100 mg twice daily every day. Supplement as needed with MiraLAX 17 g twice daily as needed. When combined with the new prescription for Linzess this should be more than adequate to relieve her problems.

## 2014-09-16 NOTE — Progress Notes (Signed)
Subjective:  Patient ID: Joseph [name], male    DOB: August 07, 1960  Age: 54 y.o. MRN: [id number]  CC: URI and Constipation   HPI Joseph [name] presents for increasing shortness of breath and productive cough. He has an indwelling tracheostomy and having trouble clearing all of the secretions recently. He has chronic constipation that does not seem to be responding to his current use of Amitiza. Some relief periodically with GlycoLax but not adequate.   History Joseph [name] has a past medical history of Diabetes mellitus; Hyperlipidemia; Weight gain; COPD (chronic obstructive pulmonary disease); and Anxiety.   He has past surgical history that includes Laryngectomy (11/13/2003) and Knee arthroscopy.   His family history includes Clotting disorder in his maternal grandfather; Emphysema in his sister; Prostate cancer in his maternal grandfather. There is no history of Atopy.He reports that he quit smoking about 11 years ago. He does not have any smokeless tobacco history on file. He reports that he does not drink alcohol. His drug history is not on file.  Current Outpatient Prescriptions on File Prior to Visit  Medication Sig Dispense Refill  . albuterol (PROVENTIL HFA;VENTOLIN HFA) 108 (90 BASE) MCG/ACT inhaler Inhale 2 puffs into the lungs every 6 (six) hours as needed for wheezing. 1 Inhaler 3  . albuterol (PROVENTIL) (2.5 MG/3ML) 0.083% nebulizer solution 1 vial in nebulizer four times daily 120 mL 11  . B-D INS SYRINGE 0.5CC/31GX5/16 31G X 5/16" 0.5 ML MISC USE TWICE DAILY TO ADMINISTER INSULIN 100 each 5  . budesonide-formoterol (SYMBICORT) 160-4.5 MCG/ACT inhaler INHALE 2 PUFFS INTO THE LUNGS 2 (TWO) TIMES DAILY. 10.2 g 2  . budesonide-formoterol (SYMBICORT) 160-4.5 MCG/ACT inhaler Use 2 puffs in the morning  and evening 30.6 g 0  . cyclobenzaprine (FLEXERIL) 5 MG tablet Take 5 mg by mouth 3 (three) times daily as needed for muscle spasms.    . diazepam (VALIUM) 2 MG tablet Take 2 mg by  mouth every 6 (six) hours as needed for anxiety.    . fenofibrate 54 MG tablet TAKE 1 TABLET BY MOUTH EVERY DAY 90 tablet 1  . gabapentin (NEURONTIN) 300 MG capsule Take 3 capsules (900 mg total) by mouth 2 (two) times daily. 180 capsule 0  . glucose blood (ACCU-CHEK AVIVA) test strip Test blood sugar before meals and at bedtime 100 each 2  . HYDROcodone-homatropine (HYCODAN) 5-1.5 MG/5ML syrup Take 5 mLs by mouth every 8 (eight) hours as needed for cough. 120 mL 0  . insulin lispro protamine-lispro (HUMALOG 75/25 MIX) (75-25) 100 UNIT/ML SUSP injection 33 units sq in am and 38 units in pm 10 mL 11  . ipratropium-albuterol (DUONEB) 0.5-2.5 (3) MG/3ML SOLN Take 3 mLs by nebulization 4 (four) times daily. 360 mL 0  . levothyroxine (SYNTHROID, LEVOTHROID) 150 MCG tablet TAKE 1 TABLET (150 MCG TOTAL) BY MOUTH DAILY. 90 tablet 0  . lubiprostone (AMITIZA) 24 MCG capsule Take 1 capsule (24 mcg total) by mouth 2 (two) times daily with a meal. 60 capsule 11  . morphine (KADIAN) 60 MG 24 hr capsule Take 60 mg by mouth daily.      Joseph [name] Kitchen oxyCODONE-acetaminophen (PERCOCET) 7.5-325 MG per tablet Take 1 tablet by mouth every 6 (six) hours as needed.      . pantoprazole (PROTONIX) 20 MG tablet TAKE 1 TABLET (20 MG TOTAL) BY MOUTH DAILY. 90 tablet 0  . QUEtiapine (SEROQUEL XR) 300 MG 24 hr tablet Take 300 mg by mouth at bedtime.    . rizatriptan (MAXALT) 10  MG tablet TAKE 1 TABLET (10 MG TOTAL) BY MOUTH AS NEEDED. MAY REPEAT IN 2 HOURS IF NEEDED 10 tablet 0  . rosuvastatin (CRESTOR) 10 MG tablet Take 1 tablet (10 mg total) by mouth daily. 90 tablet 0  . sertraline (ZOLOFT) 100 MG tablet Take 100 mg by mouth 2 (two) times daily.    . Tamsulosin HCl (FLOMAX) 0.4 MG CAPS Take 0.4 mg by mouth daily.      . traZODone (DESYREL) 100 MG tablet     . ipratropium (ATROVENT) 0.02 % nebulizer solution 1 vial in nebulizer four times daily,   DX:  496 120 mL 0   No current facility-administered medications on file prior to visit.      ROS Review of Systems  Constitutional: Negative for fever, chills, activity change and appetite change.  HENT: Positive for congestion, postnasal drip, rhinorrhea and sinus pressure. Negative for ear discharge, ear pain, hearing loss, nosebleeds, sneezing and trouble swallowing.   Respiratory: Negative for chest tightness and shortness of breath.   Cardiovascular: Negative for chest pain and palpitations.  Skin: Negative for rash.    Objective:  BP 109/54 mmHg  Pulse 101  Temp(Src) 97.5 F (36.4 C) (Oral)  Ht 6\' 2"  (1.88 m)  Wt 265 lb (120.203 kg)  BMI 34.01 kg/m2  SpO2 92%  BP Readings from Last 3 Encounters:  09/16/14 109/54  08/14/14 93/66  07/29/14 93/64    Wt Readings from Last 3 Encounters:  09/16/14 265 lb (120.203 kg)  08/14/14 267 lb (121.11 kg)  07/29/14 264 lb (119.75 kg)     Physical Exam  Constitutional: He is oriented to person, place, and time. He appears well-developed and well-nourished. No distress.  HENT:  Head: Normocephalic and atraumatic.  Right Ear: External ear normal.  Left Ear: External ear normal.  Nose: Nose normal.  Mouth/Throat: Oropharynx is clear and moist.  Eyes: Conjunctivae and EOM are normal. Pupils are equal, round, and reactive to light.  Neck: Normal range of motion. Neck supple. No thyromegaly present.  Cardiovascular: Normal rate, regular rhythm and normal heart sounds.   No murmur heard. Pulmonary/Chest: Effort normal and breath sounds normal. No respiratory distress. He has no wheezes. He has no rales.  Abdominal: Soft. Bowel sounds are normal. He exhibits no distension. There is no tenderness.  Lymphadenopathy:    He has no cervical adenopathy.  Neurological: He is alert and oriented to person, place, and time. He has normal reflexes.  Skin: Skin is warm and dry.  Psychiatric: He has a normal mood and affect. His behavior is normal. Judgment and thought content normal.    Lab Results  Component Value Date    HGBA1C 7.3% 08/14/2014   HGBA1C 5.7% 03/28/2014   HGBA1C 6.3 09/20/2013    Lab Results  Component Value Date   WBC 6.6 03/28/2014   HGB 13.4* 03/28/2014   HCT 39.9* 03/28/2014   PLT 218 10/31/2011   GLUCOSE 156* 03/28/2014   CHOL 202* 03/28/2014   TRIG 319* 03/28/2014   HDL 38* 03/28/2014   LDLCALC 100* 03/28/2014   ALT 22 03/28/2014   AST 24 03/28/2014   NA 139 03/28/2014   K 4.3 03/28/2014   CL 99 03/28/2014   CREATININE 1.44* 03/28/2014   BUN 21 03/28/2014   CO2 21 03/28/2014   TSH 3.270 09/20/2013   HGBA1C 7.3% 08/14/2014    Dg Lumbar Spine Complete  06/05/2012   *RADIOLOGY REPORT*  Clinical Data: 54 year old male with pain.  History of  neck cancer.  LUMBAR SPINE - COMPLETE 4+ VIEW  Comparison: CT chest abdomen and pelvis 05/29/2007.  Findings: Hypoplastic twelfth ribs as seen on the comparison. Otherwise normal lumbar segmentation.  Vertebral height and alignment is stable and within normal limits.  Disc space is stable and within normal limits.  No pars fracture.  Sacral ala and SI joints within normal limits.  There is a mildly sclerotic left L5- S1 assimilation joint.  Calcified atherosclerosis.  IMPRESSION: 1. No acute osseous abnormality in the lumbar spine. 2. Mildly degenerated left L5-S1 assimilation joint.  Hypoplastic twelfth ribs.   Original Report Authenticated By: Roselyn Reef, M.D.    Assessment & Plan:   Jim was seen today for uri and constipation.  Diagnoses and all orders for this visit:  Urinary frequency Orders: -     POCT UA - Microscopic Only -     POCT urinalysis dipstick -     Urine culture  SOB (shortness of breath) Orders: -     DG Chest 2 View  Cough Orders: -     DG Chest 2 View  Sinobronchitis  Slow transit constipation  Other orders -     Linaclotide (LINZESS) 290 MCG CAPS capsule; Take 1 capsule (290 mcg total) by mouth daily. -     levofloxacin (LEVAQUIN) 500 MG tablet; Take 1 tablet (500 mg total) by mouth  daily.   I have discontinued Mr. Advincula polyethylene glycol powder. I am also having him start on Linaclotide and levofloxacin. Additionally, I am having him maintain his oxyCODONE-acetaminophen, morphine, ipratropium-albuterol, ipratropium, tamsulosin, traZODone, budesonide-formoterol, sertraline, cyclobenzaprine, QUEtiapine, diazepam, glucose blood, lubiprostone, rizatriptan, budesonide-formoterol, rosuvastatin, HYDROcodone-homatropine, pantoprazole, B-D INS SYRINGE 0.5CC/31GX5/16, levothyroxine, insulin lispro protamine-lispro, fenofibrate, gabapentin, albuterol, and albuterol.  Meds ordered this encounter  Medications  . Linaclotide (LINZESS) 290 MCG CAPS capsule    Sig: Take 1 capsule (290 mcg total) by mouth daily.    Dispense:  30 capsule    Refill:  5  . levofloxacin (LEVAQUIN) 500 MG tablet    Sig: Take 1 tablet (500 mg total) by mouth daily.    Dispense:  10 tablet    Refill:  0    Follow-up: No Follow-up on file.  Claretta Fraise, M.D.

## 2014-09-17 LAB — URINE CULTURE: Organism ID, Bacteria: NO GROWTH

## 2014-10-02 ENCOUNTER — Other Ambulatory Visit: Payer: Self-pay | Admitting: Family Medicine

## 2014-10-03 DIAGNOSIS — G894 Chronic pain syndrome: Secondary | ICD-10-CM | POA: Diagnosis not present

## 2014-10-03 DIAGNOSIS — E1142 Type 2 diabetes mellitus with diabetic polyneuropathy: Secondary | ICD-10-CM | POA: Diagnosis not present

## 2014-10-03 DIAGNOSIS — M7711 Lateral epicondylitis, right elbow: Secondary | ICD-10-CM | POA: Diagnosis not present

## 2014-10-03 DIAGNOSIS — M47817 Spondylosis without myelopathy or radiculopathy, lumbosacral region: Secondary | ICD-10-CM | POA: Diagnosis not present

## 2014-10-03 DIAGNOSIS — K5900 Constipation, unspecified: Secondary | ICD-10-CM | POA: Diagnosis not present

## 2014-10-03 NOTE — Telephone Encounter (Signed)
Last seen 09/16/14 Dr Livia Snellen

## 2014-10-22 DIAGNOSIS — R3916 Straining to void: Secondary | ICD-10-CM | POA: Diagnosis not present

## 2014-10-22 DIAGNOSIS — Z125 Encounter for screening for malignant neoplasm of prostate: Secondary | ICD-10-CM | POA: Diagnosis not present

## 2014-10-29 DIAGNOSIS — R3916 Straining to void: Secondary | ICD-10-CM | POA: Diagnosis not present

## 2014-10-29 DIAGNOSIS — N5201 Erectile dysfunction due to arterial insufficiency: Secondary | ICD-10-CM | POA: Diagnosis not present

## 2014-10-31 DIAGNOSIS — M47817 Spondylosis without myelopathy or radiculopathy, lumbosacral region: Secondary | ICD-10-CM | POA: Diagnosis not present

## 2014-10-31 DIAGNOSIS — G894 Chronic pain syndrome: Secondary | ICD-10-CM | POA: Diagnosis not present

## 2014-10-31 DIAGNOSIS — K5900 Constipation, unspecified: Secondary | ICD-10-CM | POA: Diagnosis not present

## 2014-10-31 DIAGNOSIS — E1142 Type 2 diabetes mellitus with diabetic polyneuropathy: Secondary | ICD-10-CM | POA: Diagnosis not present

## 2014-11-06 ENCOUNTER — Ambulatory Visit (INDEPENDENT_AMBULATORY_CARE_PROVIDER_SITE_OTHER): Payer: Medicare Other

## 2014-11-06 ENCOUNTER — Encounter: Payer: Self-pay | Admitting: Family

## 2014-11-06 ENCOUNTER — Telehealth: Payer: Self-pay | Admitting: Family Medicine

## 2014-11-06 ENCOUNTER — Telehealth: Payer: Self-pay | Admitting: Family

## 2014-11-06 ENCOUNTER — Ambulatory Visit (INDEPENDENT_AMBULATORY_CARE_PROVIDER_SITE_OTHER): Payer: Medicare Other | Admitting: Family

## 2014-11-06 VITALS — BP 93/63 | HR 81 | Temp 99.8°F | Ht 74.0 in | Wt 257.0 lb

## 2014-11-06 DIAGNOSIS — R0602 Shortness of breath: Secondary | ICD-10-CM

## 2014-11-06 DIAGNOSIS — J209 Acute bronchitis, unspecified: Secondary | ICD-10-CM

## 2014-11-06 DIAGNOSIS — R1012 Left upper quadrant pain: Secondary | ICD-10-CM

## 2014-11-06 DIAGNOSIS — R531 Weakness: Secondary | ICD-10-CM

## 2014-11-06 LAB — POCT CBC
GRANULOCYTE PERCENT: 74.9 % (ref 37–80)
HCT, POC: 41.5 % — AB (ref 43.5–53.7)
HEMOGLOBIN: 13.1 g/dL — AB (ref 14.1–18.1)
LYMPH, POC: 1.8 (ref 0.6–3.4)
MCH, POC: 27.7 pg (ref 27–31.2)
MCHC: 31.4 g/dL — AB (ref 31.8–35.4)
MCV: 88 fL (ref 80–97)
MPV: 8.9 fL (ref 0–99.8)
PLATELET COUNT, POC: 223 10*3/uL (ref 142–424)
POC Granulocyte: 6.4 (ref 2–6.9)
POC LYMPH PERCENT: 21.2 %L (ref 10–50)
RBC: 4.72 M/uL (ref 4.69–6.13)
RDW, POC: 14.6 %
WBC: 8.6 10*3/uL (ref 4.6–10.2)

## 2014-11-06 LAB — POCT INFLUENZA A/B
INFLUENZA A, POC: NEGATIVE
Influenza B, POC: NEGATIVE

## 2014-11-06 MED ORDER — DM-GUAIFENESIN ER 30-600 MG PO TB12: 1.0000 | ORAL_TABLET | Freq: Two times a day (BID) | ORAL | Status: DC

## 2014-11-06 MED ORDER — METHYLPREDNISOLONE 4 MG PO TBPK: ORAL_TABLET | ORAL | Status: DC

## 2014-11-06 MED ORDER — LEVOFLOXACIN 500 MG PO TABS: 500.0000 mg | ORAL_TABLET | Freq: Every day | ORAL | Status: DC

## 2014-11-06 MED ORDER — METHYLPREDNISOLONE ACETATE 80 MG/ML IJ SUSP
80.0000 mg | Freq: Once | INTRAMUSCULAR | Status: AC
  Administered 2014-11-06: 80 mg via INTRAMUSCULAR

## 2014-11-06 NOTE — Telephone Encounter (Signed)
Stp and he has a horrible chest cold, he is so congested that it is hard to understand him. Pt given appt today with Evelina Dun at 5:40 but told to come in at 4:45 for ? Chest x-ray.

## 2014-11-06 NOTE — Progress Notes (Signed)
Subjective:    Patient ID: Joseph [name], male    DOB: 03-12-1961, 54 y.o.   MRN: [id number]  Cough This is a new problem. The current episode started in the past 7 days. The problem has been gradually worsening. The problem occurs every few minutes. The cough is productive of sputum. Associated symptoms include a fever, headaches, nasal congestion, postnasal drip, a sore throat, shortness of breath and wheezing. Pertinent negatives include no chills, ear congestion or ear pain. The symptoms are aggravated by lying down. He has tried rest for the symptoms. Throat cancer      Review of Systems  Constitutional: Positive for fever. Negative for chills.  HENT: Positive for postnasal drip and sore throat. Negative for ear pain.   Respiratory: Positive for cough, shortness of breath and wheezing.   Cardiovascular: Negative.   Gastrointestinal: Negative.   Endocrine: Negative.   Genitourinary: Negative.   Musculoskeletal: Negative.   Neurological: Positive for headaches.  Hematological: Negative.   Psychiatric/Behavioral: Negative.   All other systems reviewed and are negative.      Objective:   Physical Exam  Constitutional: He is oriented to person, place, and time. He appears well-developed and well-nourished. He appears lethargic. He has a sickly appearance. No distress.  HENT:  Head: Normocephalic.  Right Ear: External ear normal.  Left Ear: External ear normal.  Nasal passage erythemas with mild swelling  Oropharynx erythemas  Eyes: Pupils are equal, round, and reactive to light. Right eye exhibits no discharge. Left eye exhibits no discharge.  Neck: Normal range of motion. No thyromegaly present.  Lurline Idol present  Cardiovascular: Normal rate, regular rhythm, normal heart sounds and intact distal pulses.   No murmur heard. Pulmonary/Chest: No respiratory distress. He has no wheezes.  Abdominal: Soft. Bowel sounds are normal. He exhibits distension. There is tenderness.    Musculoskeletal: Normal range of motion. He exhibits no edema or tenderness.  Neurological: He is oriented to person, place, and time. He has normal reflexes. He appears lethargic. No cranial nerve deficit.  Skin: Skin is warm and dry. No rash noted. No erythema.  Psychiatric: He has a normal mood and affect. His behavior is normal. Judgment and thought content normal.  Vitals reviewed.  Results for orders placed or performed in visit on 11/06/14  POCT CBC  Result Value Ref Range   WBC 8.6 4.6 - 10.2 K/uL   Lymph, poc 1.8 0.6 - 3.4   POC LYMPH PERCENT 21.2 10 - 50 %L   POC Granulocyte 6.4 2 - 6.9   Granulocyte percent 74.9 37 - 80 %G   RBC 4.72 4.69 - 6.13 M/uL   Hemoglobin 13.1 (A) 14.1 - 18.1 g/dL   HCT, POC 41.5 (A) 43.5 - 53.7 %   MCV 88.0 80 - 97 fL   MCH, POC 27.7 27 - 31.2 pg   MCHC 31.4 (A) 31.8 - 35.4 g/dL   RDW, POC 14.6 %   Platelet Count, POC 223.0 142 - 424 K/uL   MPV 8.9 0 - 99.8 fL  POCT Influenza A/B  Result Value Ref Range   Influenza A, POC Negative    Influenza B, POC Negative     Chest x-ray- WNL Preliminary reading by Evelina Dun, FNP [facility]   BP 93/63 mmHg  Pulse 98  Temp(Src) 99.8 F (37.7 C) (Oral)  Ht 6\' 2"  (1.88 m)  Wt 257 lb (116.574 kg)  BMI 32.98 kg/m2     Assessment & Plan:  1. Shortness of  breath - DG Chest 2 View; Future  2. Left upper quadrant pain - POCT CBC  3. Generalized weakness - POCT Influenza A/B - levofloxacin (LEVAQUIN) 500 MG tablet; Take 1 tablet (500 mg total) by mouth daily.  Dispense: 7 tablet; Refill: 0 - dextromethorphan-guaiFENesin (MUCINEX DM) 30-600 MG per 12 hr tablet; Take 1 tablet by mouth 2 (two) times daily.  Dispense: 30 tablet; Refill: 1  4. Acute bronchitis, unspecified organism - levofloxacin (LEVAQUIN) 500 MG tablet; Take 1 tablet (500 mg total) by mouth daily.  Dispense: 7 tablet; Refill: 0 - dextromethorphan-guaiFENesin (MUCINEX DM) 30-600 MG per 12 hr tablet; Take 1 tablet by mouth 2 (two) times  daily.  Dispense: 30 tablet; Refill: 1 - methylPREDNISolone (MEDROL DOSEPAK) 4 MG TBPK tablet; Use as directed  Dispense: 21 tablet; Refill: 0 - methylPREDNISolone acetate (DEPO-MEDROL) injection 80 mg; Inject 1 mL (80 mg total) into the muscle once.  Pt told if abd pain or SOB becomes worse to go to ED Take Mucinex BID for next 7-10 days Force fluids Tylenol prn for pain RTO in 2 days - On Friday to recheck  Evelina Dun, FNP

## 2014-11-06 NOTE — Patient Instructions (Signed)
Acute Bronchitis Bronchitis is inflammation of the airways that extend from the windpipe into the lungs (bronchi). The inflammation often causes mucus to develop. This leads to a cough, which is the most common symptom of bronchitis.  In acute bronchitis, the condition usually develops suddenly and goes away over time, usually in a couple weeks. Smoking, allergies, and asthma can make bronchitis worse. Repeated episodes of bronchitis may cause further lung problems.  CAUSES Acute bronchitis is most often caused by the same virus that causes a cold. The virus can spread from person to person (contagious) through coughing, sneezing, and touching contaminated objects. SIGNS AND SYMPTOMS   Cough.   Fever.   Coughing up mucus.   Body aches.   Chest congestion.   Chills.   Shortness of breath.   Sore throat.  DIAGNOSIS  Acute bronchitis is usually diagnosed through a physical exam. Your health care provider will also ask you questions about your medical history. Tests, such as chest X-rays, are sometimes done to rule out other conditions.  TREATMENT  Acute bronchitis usually goes away in a couple weeks. Oftentimes, no medical treatment is necessary. Medicines are sometimes given for relief of fever or cough. Antibiotic medicines are usually not needed but may be prescribed in certain situations. In some cases, an inhaler may be recommended to help reduce shortness of breath and control the cough. A cool mist vaporizer may also be used to help thin bronchial secretions and make it easier to clear the chest.  HOME CARE INSTRUCTIONS  Get plenty of rest.   Drink enough fluids to keep your urine clear or pale yellow (unless you have a medical condition that requires fluid restriction). Increasing fluids may help thin your respiratory secretions (sputum) and reduce chest congestion, and it will prevent dehydration.   Take medicines only as directed by your health care provider.  If  you were prescribed an antibiotic medicine, finish it all even if you start to feel better.  Avoid smoking and secondhand smoke. Exposure to cigarette smoke or irritating chemicals will make bronchitis worse. If you are a smoker, consider using nicotine gum or skin patches to help control withdrawal symptoms. Quitting smoking will help your lungs heal faster.   Reduce the chances of another bout of acute bronchitis by washing your hands frequently, avoiding people with cold symptoms, and trying not to touch your hands to your mouth, nose, or eyes.   Keep all follow-up visits as directed by your health care provider.  SEEK MEDICAL CARE IF: Your symptoms do not improve after 1 week of treatment.  SEEK IMMEDIATE MEDICAL CARE IF:  You develop an increased fever or chills.   You have chest pain.   You have severe shortness of breath.  You have bloody sputum.   You develop dehydration.  You faint or repeatedly feel like you are going to pass out.  You develop repeated vomiting.  You develop a severe headache. MAKE SURE YOU:   Understand these instructions.  Will watch your condition.  Will get help right away if you are not doing well or get worse. Document Released: 08/12/2004 Document Revised: 11/19/2013 Document Reviewed: 12/26/2012 [employer] Patient Information 2015 [employer]. This information is not intended to replace advice given to you by your health care provider. Make sure you discuss any questions you have with your health care provider.  - Take meds as prescribed - Use a cool mist humidifier  -Use saline nose sprays frequently -Saline irrigations of the nose can   be very helpful if done frequently.  * 4X daily for 1 week*  * Use of a nettie pot can be helpful with this. Follow directions with this* -Force fluids -For any cough or congestion  Use plain Mucinex- regular strength or max strength is fine   * Children- consult with Pharmacist for dosing -For  fever or aces or pains- take tylenol or ibuprofen appropriate for age and weight.  * for fevers greater than 101 orally you may alternate ibuprofen and tylenol every  3 hours. -Throat lozenges if help    Christy Hawks, FNP  

## 2014-11-07 NOTE — Telephone Encounter (Signed)
Pt given appt tomorrow with Alyse Low at 12:10.

## 2014-11-08 ENCOUNTER — Encounter: Payer: Self-pay | Admitting: Family

## 2014-11-08 ENCOUNTER — Ambulatory Visit (INDEPENDENT_AMBULATORY_CARE_PROVIDER_SITE_OTHER): Payer: Medicare Other | Admitting: Family

## 2014-11-08 VITALS — BP 101/68 | HR 77 | Temp 98.0°F | Ht 74.0 in | Wt 260.8 lb

## 2014-11-08 DIAGNOSIS — I959 Hypotension, unspecified: Secondary | ICD-10-CM

## 2014-11-08 DIAGNOSIS — R0602 Shortness of breath: Secondary | ICD-10-CM

## 2014-11-08 DIAGNOSIS — Z09 Encounter for follow-up examination after completed treatment for conditions other than malignant neoplasm: Secondary | ICD-10-CM | POA: Diagnosis not present

## 2014-11-08 DIAGNOSIS — J209 Acute bronchitis, unspecified: Secondary | ICD-10-CM

## 2014-11-08 NOTE — Progress Notes (Signed)
   Subjective:    Patient ID: Joseph [name], male    DOB: [birth year], 54 y.o.   MRN: [id number]  HPI Pt presents to the office today to recheck SOB. PT was seen in the office on 4/20 for SOB, cough, generalized weakness, abd pain, and acute bronchitis. Pt states his breathing is much better and the mucus he was having "has thicken up so I can cough it up now." [name] BP has decreased from last visit. Pt states he is having a headache, chills, and weakness.  Pt has history of throat cancer in 2006 and has a trach.    Review of Systems  Constitutional: Positive for chills.  HENT: Negative.   Respiratory: Negative.   Cardiovascular: Negative.   Gastrointestinal: Negative.   Endocrine: Negative.   Genitourinary: Negative.   Musculoskeletal: Negative.   Neurological: Negative.   Hematological: Negative.   Psychiatric/Behavioral: Negative.   All other systems reviewed and are negative.      Objective:   Physical Exam  Constitutional: He is oriented to person, place, and time. He appears well-developed and well-nourished. No distress.  HENT:  Head: Normocephalic.  Eyes: Pupils are equal, round, and reactive to light. Right eye exhibits no discharge. Left eye exhibits no discharge.  Neck: Normal range of motion. Neck supple. No thyromegaly present.  Cardiovascular: Normal rate, regular rhythm, normal heart sounds and intact distal pulses.   No murmur heard. Pulmonary/Chest: Effort normal and breath sounds normal. No respiratory distress. He has no wheezes.  Abdominal: Soft. Bowel sounds are normal. He exhibits no distension. There is tenderness (LUQ pain).  Musculoskeletal: Normal range of motion. He exhibits no edema or tenderness.  Neurological: He is alert and oriented to person, place, and time. He has normal reflexes. No cranial nerve deficit.  Skin: Skin is warm and dry. No rash noted. No erythema.  Psychiatric: He has a normal mood and affect. His behavior is normal. Judgment  and thought content normal.  Vitals reviewed.  500 ml bolus given to pt- [name] BP increased to 97/61 and pulse 77. Will give another 500 ml bolus-101/68 and HR-77   BP 86/65 mmHg  Pulse 94  Temp(Src) 98 F (36.7 C) (Oral)  Ht 6\' 2"  (1.88 m)  Wt 260 lb 12.8 oz (118.298 kg)  BMI 33.47 kg/m2     Assessment & Plan:  1. Hypotension, unspecified hypotension type Force fluids Monitor BP at home- If drops go to ED   2. SOB (shortness of breath)  3. Follow up  4. Acute bronchitis, unspecified organism  Continue all meds Rest Continue Mucinex RTO in 5-7 days If pt becomes worse he needs to go to ED- Pt refused to go to ED today  Evelina Dun, FNP

## 2014-11-08 NOTE — Patient Instructions (Addendum)
Acute Bronchitis Bronchitis is inflammation of the airways that extend from the windpipe into the lungs (bronchi). The inflammation often causes mucus to develop. This leads to a cough, which is the most common symptom of bronchitis.  In acute bronchitis, the condition usually develops suddenly and goes away over time, usually in a couple weeks. Smoking, allergies, and asthma can make bronchitis worse. Repeated episodes of bronchitis may cause further lung problems.  CAUSES Acute bronchitis is most often caused by the same virus that causes a cold. The virus can spread from person to person (contagious) through coughing, sneezing, and touching contaminated objects. SIGNS AND SYMPTOMS   Cough.   Fever.   Coughing up mucus.   Body aches.   Chest congestion.   Chills.   Shortness of breath.   Sore throat.  DIAGNOSIS  Acute bronchitis is usually diagnosed through a physical exam. Your health care provider will also ask you questions about your medical history. Tests, such as chest X-rays, are sometimes done to rule out other conditions.  TREATMENT  Acute bronchitis usually goes away in a couple weeks. Oftentimes, no medical treatment is necessary. Medicines are sometimes given for relief of fever or cough. Antibiotic medicines are usually not needed but may be prescribed in certain situations. In some cases, an inhaler may be recommended to help reduce shortness of breath and control the cough. A cool mist vaporizer may also be used to help thin bronchial secretions and make it easier to clear the chest.  HOME CARE INSTRUCTIONS  Get plenty of rest.   Drink enough fluids to keep your urine clear or pale yellow (unless you have a medical condition that requires fluid restriction). Increasing fluids may help thin your respiratory secretions (sputum) and reduce chest congestion, and it will prevent dehydration.   Take medicines only as directed by your health care provider.  If  you were prescribed an antibiotic medicine, finish it all even if you start to feel better.  Avoid smoking and secondhand smoke. Exposure to cigarette smoke or irritating chemicals will make bronchitis worse. If you are a smoker, consider using nicotine gum or skin patches to help control withdrawal symptoms. Quitting smoking will help your lungs heal faster.   Reduce the chances of another bout of acute bronchitis by washing your hands frequently, avoiding people with cold symptoms, and trying not to touch your hands to your mouth, nose, or eyes.   Keep all follow-up visits as directed by your health care provider.  SEEK MEDICAL CARE IF: Your symptoms do not improve after 1 week of treatment.  SEEK IMMEDIATE MEDICAL CARE IF:  You develop an increased fever or chills.   You have chest pain.   You have severe shortness of breath.  You have bloody sputum.   You develop dehydration.  You faint or repeatedly feel like you are going to pass out.  You develop repeated vomiting.  You develop a severe headache. MAKE SURE YOU:   Understand these instructions.  Will watch your condition.  Will get help right away if you are not doing well or get worse. Document Released: 08/12/2004 Document Revised: 11/19/2013 Document Reviewed: 12/26/2012 [facility] Patient Information 2015 [place], [place]. This information is not intended to replace advice given to you by your health care provider. Make sure you discuss any questions you have with your health care provider. Hypotension As your heart beats, it forces blood through your arteries. This force is your blood pressure. If your blood pressure is too low  for you to go about your normal activities or to support the organs of your body, you have hypotension. Hypotension is also referred to as low blood pressure. When your blood pressure becomes too low, you may not get enough blood to your brain. As a result, you may feel weak, feel lightheaded,  or develop a rapid heart rate. In a more severe case, you may faint. CAUSES Various conditions can cause hypotension. These include:  Blood loss.  Dehydration.  Heart or endocrine problems.  Pregnancy.  Severe infection.  Not having a well-balanced diet filled with needed nutrients.  Severe allergic reactions (anaphylaxis). Some medicines, such as blood pressure medicine or water pills (diuretics), may lower your blood pressure below normal. Sometimes taking too much medicine or taking medicine not as directed can cause hypotension. TREATMENT  Hospitalization is sometimes required for hypotension if fluid or blood replacement is needed, if time is needed for medicines to wear off, or if further monitoring is needed. Treatment might include changing your diet, changing your medicines (including medicines aimed at raising your blood pressure), and use of support stockings. HOME CARE INSTRUCTIONS   Drink enough fluids to keep your urine clear or pale yellow.  Take your medicines as directed by your health care provider.  Get up slowly from reclining or sitting positions. This gives your blood pressure a chance to adjust.  Wear support stockings as directed by your health care provider.  Maintain a healthy diet by including nutritious food, such as fruits, vegetables, nuts, whole grains, and lean meats. SEEK MEDICAL CARE IF:  You have vomiting or diarrhea.  You have a fever for more than 2-3 days.  You feel more thirsty than usual.  You feel weak and tired. SEEK IMMEDIATE MEDICAL CARE IF:   You have chest pain or a fast or irregular heartbeat.  You have a loss of feeling in some part of your body, or you lose movement in your arms or legs.  You have trouble speaking.  You become sweaty or feel lightheaded.  You faint. MAKE SURE YOU:   Understand these instructions.  Will watch your condition.  Will get help right away if you are not doing well or get  worse. Document Released: 07/05/2005 Document Revised: 04/25/2013 Document Reviewed: 01/05/2013 [facility] Patient Information 2015 [place], [place]. This information is not intended to replace advice given to you by your health care provider. Make sure you discuss any questions you have with your health care provider.

## 2014-11-14 ENCOUNTER — Ambulatory Visit: Payer: Medicare Other | Admitting: Family Medicine

## 2014-11-21 ENCOUNTER — Ambulatory Visit (INDEPENDENT_AMBULATORY_CARE_PROVIDER_SITE_OTHER): Payer: Medicare Other | Admitting: Family

## 2014-11-21 ENCOUNTER — Encounter: Payer: Self-pay | Admitting: Family

## 2014-11-21 VITALS — BP 80/68 | HR 91 | Temp 97.6°F | Ht 74.0 in | Wt 263.6 lb

## 2014-11-21 DIAGNOSIS — I959 Hypotension, unspecified: Secondary | ICD-10-CM | POA: Diagnosis not present

## 2014-11-21 DIAGNOSIS — J209 Acute bronchitis, unspecified: Secondary | ICD-10-CM

## 2014-11-21 DIAGNOSIS — Z09 Encounter for follow-up examination after completed treatment for conditions other than malignant neoplasm: Secondary | ICD-10-CM

## 2014-11-21 MED ORDER — METHYLPREDNISOLONE 4 MG PO TBPK: ORAL_TABLET | ORAL | Status: DC

## 2014-11-21 MED ORDER — DOXYCYCLINE HYCLATE 100 MG PO TABS: 100.0000 mg | ORAL_TABLET | Freq: Two times a day (BID) | ORAL | Status: DC

## 2014-11-21 MED ORDER — METHYLPREDNISOLONE ACETATE 80 MG/ML IJ SUSP
80.0000 mg | Freq: Once | INTRAMUSCULAR | Status: AC
  Administered 2014-11-21: 80 mg via INTRAMUSCULAR

## 2014-11-21 NOTE — Patient Instructions (Signed)
Acute Bronchitis Bronchitis is inflammation of the airways that extend from the windpipe into the lungs (bronchi). The inflammation often causes mucus to develop. This leads to a cough, which is the most common symptom of bronchitis.  In acute bronchitis, the condition usually develops suddenly and goes away over time, usually in a couple weeks. Smoking, allergies, and asthma can make bronchitis worse. Repeated episodes of bronchitis may cause further lung problems.  CAUSES Acute bronchitis is most often caused by the same virus that causes a cold. The virus can spread from person to person (contagious) through coughing, sneezing, and touching contaminated objects. SIGNS AND SYMPTOMS   Cough.   Fever.   Coughing up mucus.   Body aches.   Chest congestion.   Chills.   Shortness of breath.   Sore throat.  DIAGNOSIS  Acute bronchitis is usually diagnosed through a physical exam. Your health care provider will also ask you questions about your medical history. Tests, such as chest X-rays, are sometimes done to rule out other conditions.  TREATMENT  Acute bronchitis usually goes away in a couple weeks. Oftentimes, no medical treatment is necessary. Medicines are sometimes given for relief of fever or cough. Antibiotic medicines are usually not needed but may be prescribed in certain situations. In some cases, an inhaler may be recommended to help reduce shortness of breath and control the cough. A cool mist vaporizer may also be used to help thin bronchial secretions and make it easier to clear the chest.  HOME CARE INSTRUCTIONS  Get plenty of rest.   Drink enough fluids to keep your urine clear or pale yellow (unless you have a medical condition that requires fluid restriction). Increasing fluids may help thin your respiratory secretions (sputum) and reduce chest congestion, and it will prevent dehydration.   Take medicines only as directed by your health care provider.  If  you were prescribed an antibiotic medicine, finish it all even if you start to feel better.  Avoid smoking and secondhand smoke. Exposure to cigarette smoke or irritating chemicals will make bronchitis worse. If you are a smoker, consider using nicotine gum or skin patches to help control withdrawal symptoms. Quitting smoking will help your lungs heal faster.   Reduce the chances of another bout of acute bronchitis by washing your hands frequently, avoiding people with cold symptoms, and trying not to touch your hands to your mouth, nose, or eyes.   Keep all follow-up visits as directed by your health care provider.  SEEK MEDICAL CARE IF: Your symptoms do not improve after 1 week of treatment.  SEEK IMMEDIATE MEDICAL CARE IF:  You develop an increased fever or chills.   You have chest pain.   You have severe shortness of breath.  You have bloody sputum.   You develop dehydration.  You faint or repeatedly feel like you are going to pass out.  You develop repeated vomiting.  You develop a severe headache. MAKE SURE YOU:   Understand these instructions.  Will watch your condition.  Will get help right away if you are not doing well or get worse. Document Released: 08/12/2004 Document Revised: 11/19/2013 Document Reviewed: 12/26/2012 [facility] Patient Information 2015 [place], [place]. This information is not intended to replace advice given to you by your health care provider. Make sure you discuss any questions you have with your health care provider. Hypotension As your heart beats, it forces blood through your arteries. This force is your blood pressure. If your blood pressure is too low  for you to go about your normal activities or to support the organs of your body, you have hypotension. Hypotension is also referred to as low blood pressure. When your blood pressure becomes too low, you may not get enough blood to your brain. As a result, you may feel weak, feel lightheaded,  or develop a rapid heart rate. In a more severe case, you may faint. CAUSES Various conditions can cause hypotension. These include:  Blood loss.  Dehydration.  Heart or endocrine problems.  Pregnancy.  Severe infection.  Not having a well-balanced diet filled with needed nutrients.  Severe allergic reactions (anaphylaxis). Some medicines, such as blood pressure medicine or water pills (diuretics), may lower your blood pressure below normal. Sometimes taking too much medicine or taking medicine not as directed can cause hypotension. TREATMENT  Hospitalization is sometimes required for hypotension if fluid or blood replacement is needed, if time is needed for medicines to wear off, or if further monitoring is needed. Treatment might include changing your diet, changing your medicines (including medicines aimed at raising your blood pressure), and use of support stockings. HOME CARE INSTRUCTIONS   Drink enough fluids to keep your urine clear or pale yellow.  Take your medicines as directed by your health care provider.  Get up slowly from reclining or sitting positions. This gives your blood pressure a chance to adjust.  Wear support stockings as directed by your health care provider.  Maintain a healthy diet by including nutritious food, such as fruits, vegetables, nuts, whole grains, and lean meats. SEEK MEDICAL CARE IF:  You have vomiting or diarrhea.  You have a fever for more than 2-3 days.  You feel more thirsty than usual.  You feel weak and tired. SEEK IMMEDIATE MEDICAL CARE IF:   You have chest pain or a fast or irregular heartbeat.  You have a loss of feeling in some part of your body, or you lose movement in your arms or legs.  You have trouble speaking.  You become sweaty or feel lightheaded.  You faint. MAKE SURE YOU:   Understand these instructions.  Will watch your condition.  Will get help right away if you are not doing well or get  worse. Document Released: 07/05/2005 Document Revised: 04/25/2013 Document Reviewed: 01/05/2013 [facility] Patient Information 2015 [place], [place]. This information is not intended to replace advice given to you by your health care provider. Make sure you discuss any questions you have with your health care provider.

## 2014-11-21 NOTE — Progress Notes (Addendum)
   Subjective:    Patient ID: Joseph [name], male    DOB: 06-12-1961, 54 y.o.   MRN: [id number]   HPI Pt presents to the office today to recheck hypotension, acute bronchitis, and SOB. PT states he is feeling better, but is still having a lot of coughing with mucus. Pt states the mucus is light green and thick. Pt states he is SOB at times but it has improved. Pt states he feels "good" and states he is drinking "alot of water".    Review of Systems  Constitutional: Negative.   HENT: Negative.   Respiratory: Negative.   Cardiovascular: Negative.   Gastrointestinal: Negative.   Endocrine: Negative.   Genitourinary: Negative.   Musculoskeletal: Negative.   Neurological: Negative.   Hematological: Negative.   Psychiatric/Behavioral: Negative.   All other systems reviewed and are negative.      Objective:   Physical Exam  Constitutional: He is oriented to person, place, and time. He appears well-developed and well-nourished. No distress.  HENT:  Head: Normocephalic.  Right Ear: External ear normal.  Left Ear: External ear normal.  Nose: Nose normal.  Mouth/Throat: Oropharynx is clear and moist.  Eyes: Pupils are equal, round, and reactive to light. Right eye exhibits no discharge. Left eye exhibits no discharge.  Neck: Normal range of motion. Neck supple. No thyromegaly present.  Cardiovascular: Normal rate, regular rhythm, normal heart sounds and intact distal pulses.   No murmur heard. Pulmonary/Chest: Effort normal and breath sounds normal. No respiratory distress. He has no wheezes.  Abdominal: Soft. Bowel sounds are normal. He exhibits no distension. There is no tenderness.  Musculoskeletal: Normal range of motion. He exhibits no edema or tenderness.  Neurological: He is alert and oriented to person, place, and time. He has normal reflexes. No cranial nerve deficit.  Skin: Skin is warm and dry. No rash noted. No erythema.  Psychiatric: He has a normal mood and affect.  His behavior is normal. Judgment and thought content normal.  Vitals reviewed.     BP 80/68 mmHg  Pulse 91  Temp(Src) 97.6 F (36.4 C) (Oral)  Ht 6\' 2"  (1.88 m)  Wt 263 lb 9.6 oz (119.568 kg)  BMI 33.83 kg/m2     Assessment & Plan:  1. Acute bronchitis, unspecified organism - methylPREDNISolone acetate (DEPO-MEDROL) injection 80 mg; Inject 1 mL (80 mg total) into the muscle once. - doxycycline (VIBRA-TABS) 100 MG tablet; Take 1 tablet (100 mg total) by mouth 2 (two) times daily.  Dispense: 20 tablet; Refill: 0 - methylPREDNISolone (MEDROL DOSEPAK) 4 MG TBPK tablet; Use as directed  Dispense: 21 tablet; Refill: 0  2. Follow up - methylPREDNISolone acetate (DEPO-MEDROL) injection 80 mg; Inject 1 mL (80 mg total) into the muscle once. - doxycycline (VIBRA-TABS) 100 MG tablet; Take 1 tablet (100 mg total) by mouth 2 (two) times daily.  Dispense: 20 tablet; Refill: 0  3. Hypotension, unspecified hypotension type  Pt to continue Mucinex BID FORCE FLUIDS Pt told if his BP decreases any or if he feels worse to go to ED Pt refuses to go to ED today-States he is feeling better Pt advised to only take pain meds and valium only as needed and to try to avoid as much as possible bc of his BP RTO 1 week  Evelina Dun, FNP   Evelina Dun, [place]

## 2014-11-28 DIAGNOSIS — K5900 Constipation, unspecified: Secondary | ICD-10-CM | POA: Diagnosis not present

## 2014-11-28 DIAGNOSIS — G894 Chronic pain syndrome: Secondary | ICD-10-CM | POA: Diagnosis not present

## 2014-11-28 DIAGNOSIS — M47817 Spondylosis without myelopathy or radiculopathy, lumbosacral region: Secondary | ICD-10-CM | POA: Diagnosis not present

## 2014-11-28 DIAGNOSIS — E1142 Type 2 diabetes mellitus with diabetic polyneuropathy: Secondary | ICD-10-CM | POA: Diagnosis not present

## 2014-12-03 ENCOUNTER — Encounter: Payer: Self-pay | Admitting: Family

## 2014-12-03 ENCOUNTER — Ambulatory Visit (INDEPENDENT_AMBULATORY_CARE_PROVIDER_SITE_OTHER): Payer: Medicare Other | Admitting: Family

## 2014-12-03 VITALS — BP 93/67 | HR 86 | Temp 98.9°F | Ht 74.0 in | Wt 252.8 lb

## 2014-12-03 DIAGNOSIS — I959 Hypotension, unspecified: Secondary | ICD-10-CM

## 2014-12-03 DIAGNOSIS — J309 Allergic rhinitis, unspecified: Secondary | ICD-10-CM

## 2014-12-03 DIAGNOSIS — Z09 Encounter for follow-up examination after completed treatment for conditions other than malignant neoplasm: Secondary | ICD-10-CM | POA: Diagnosis not present

## 2014-12-03 MED ORDER — MONTELUKAST SODIUM 10 MG PO TABS: 10.0000 mg | ORAL_TABLET | Freq: Every day | ORAL | Status: DC

## 2014-12-03 NOTE — Patient Instructions (Addendum)
Health Maintenance A healthy lifestyle and preventative care can promote health and wellness.  Maintain regular health, dental, and eye exams.  Eat a healthy diet. Foods like vegetables, fruits, whole grains, low-fat dairy products, and lean protein foods contain the nutrients you need and are low in calories. Decrease your intake of foods high in solid fats, added sugars, and salt. Get information about a proper diet from your health care provider, if necessary.  Regular physical exercise is one of the most important things you can do for your health. Most adults should get at least 150 minutes of moderate-intensity exercise (any activity that increases your heart rate and causes you to sweat) each week. In addition, most adults need muscle-strengthening exercises on 2 or more days a week.   Maintain a healthy weight. The body mass index (BMI) is a screening tool to identify possible weight problems. It provides an estimate of body fat based on height and weight. Your health care provider can find your BMI and can help you achieve or maintain a healthy weight. For males 20 years and older:  A BMI below 18.5 is considered underweight.  A BMI of 18.5 to 24.9 is normal.  A BMI of 25 to 29.9 is considered overweight.  A BMI of 30 and above is considered obese.  Maintain normal blood lipids and cholesterol by exercising and minimizing your intake of saturated fat. Eat a balanced diet with plenty of fruits and vegetables. Blood tests for lipids and cholesterol should begin at age 20 and be repeated every 5 years. If your lipid or cholesterol levels are high, you are over age 50, or you are at high risk for heart disease, you may need your cholesterol levels checked more frequently.Ongoing high lipid and cholesterol levels should be treated with medicines if diet and exercise are not working.  If you smoke, find out from your health care provider how to quit. If you do not use tobacco, do not  start.  Lung cancer screening is recommended for adults aged 55-80 years who are at high risk for developing lung cancer because of a history of smoking. A yearly low-dose CT scan of the lungs is recommended for people who have at least a 30-pack-year history of smoking and are current smokers or have quit within the past 15 years. A pack year of smoking is smoking an average of 1 pack of cigarettes a day for 1 year (for example, a 30-pack-year history of smoking could mean smoking 1 pack a day for 30 years or 2 packs a day for 15 years). Yearly screening should continue until the smoker has stopped smoking for at least 15 years. Yearly screening should be stopped for people who develop a health problem that would prevent them from having lung cancer treatment.  If you choose to drink alcohol, do not have more than 2 drinks per day. One drink is considered to be 12 oz (360 mL) of beer, 5 oz (150 mL) of wine, or 1.5 oz (45 mL) of liquor.  Avoid the use of street drugs. Do not share needles with anyone. Ask for help if you need support or instructions about stopping the use of drugs.  High blood pressure causes heart disease and increases the risk of stroke. Blood pressure should be checked at least every 1-2 years. Ongoing high blood pressure should be treated with medicines if weight loss and exercise are not effective.  If you are 45-79 years old, ask your health care provider if   you should take aspirin to prevent heart disease.  Diabetes screening involves taking a blood sample to check your fasting blood sugar level. This should be done once every 3 years after age 45 if you are at a normal weight and without risk factors for diabetes. Testing should be considered at a younger age or be carried out more frequently if you are overweight and have at least 1 risk factor for diabetes.  Colorectal cancer can be detected and often prevented. Most routine colorectal cancer screening begins at the age of 50  and continues through age 75. However, your health care provider may recommend screening at an earlier age if you have risk factors for colon cancer. On a yearly basis, your health care provider may provide home test kits to check for hidden blood in the stool. A small camera at the end of a tube may be used to directly examine the colon (sigmoidoscopy or colonoscopy) to detect the earliest forms of colorectal cancer. Talk to your health care provider about this at age 50 when routine screening begins. A direct exam of the colon should be repeated every 5-10 years through age 75, unless early forms of precancerous polyps or small growths are found.  People who are at an increased risk for hepatitis B should be screened for this virus. You are considered at high risk for hepatitis B if:  You were born in a country where hepatitis B occurs often. Talk with your health care provider about which countries are considered high risk.  Your parents were born in a high-risk country and you have not received a shot to protect against hepatitis B (hepatitis B vaccine).  You have HIV or AIDS.  You use needles to inject street drugs.  You live with, or have sex with, someone who has hepatitis B.  You are a man who has sex with other men (MSM).  You get hemodialysis treatment.  You take certain medicines for conditions like cancer, organ transplantation, and autoimmune conditions.  Hepatitis C blood testing is recommended for all people born from 1945 through 1965 and any individual with known risk factors for hepatitis C.  Healthy men should no longer receive prostate-specific antigen (PSA) blood tests as part of routine cancer screening. Talk to your health care provider about prostate cancer screening.  Testicular cancer screening is not recommended for adolescents or adult males who have no symptoms. Screening includes self-exam, a health care provider exam, and other screening tests. Consult with your  health care provider about any symptoms you have or any concerns you have about testicular cancer.  Practice safe sex. Use condoms and avoid high-risk sexual practices to reduce the spread of sexually transmitted infections (STIs).  You should be screened for STIs, including gonorrhea and chlamydia if:  You are sexually active and are younger than 24 years.  You are older than 24 years, and your health care provider tells you that you are at risk for this type of infection.  Your sexual activity has changed since you were last screened, and you are at an increased risk for chlamydia or gonorrhea. Ask your health care provider if you are at risk.  If you are at risk of being infected with HIV, it is recommended that you take a prescription medicine daily to prevent HIV infection. This is called pre-exposure prophylaxis (PrEP). You are considered at risk if:  You are a man who has sex with other men (MSM).  You are a heterosexual man who   is sexually active with multiple partners.  You take drugs by injection.  You are sexually active with a partner who has HIV.  Talk with your health care provider about whether you are at high risk of being infected with HIV. If you choose to begin PrEP, you should first be tested for HIV. You should then be tested every 3 months for as long as you are taking PrEP.  Use sunscreen. Apply sunscreen liberally and repeatedly throughout the day. You should seek shade when your shadow is shorter than you. Protect yourself by wearing long sleeves, pants, a wide-brimmed hat, and sunglasses year round whenever you are outdoors.  Tell your health care provider of new moles or changes in moles, especially if there is a change in shape or color. Also, tell your health care provider if a mole is larger than the size of a pencil eraser.  A one-time screening for abdominal aortic aneurysm (AAA) and surgical repair of large AAAs by ultrasound is recommended for men aged  45-75 years who are current or former smokers.  Stay current with your vaccines (immunizations). Document Released: 01/01/2008 Document Revised: 07/10/2013 Document Reviewed: 11/30/2010 [facility] Patient Information 2015 [place], [place]. This information is not intended to replace advice given to you by Hypotension As your heart beats, it forces blood through your arteries. This force is your blood pressure. If your blood pressure is too low for you to go about your normal activities or to support the organs of your body, you have hypotension. Hypotension is also referred to as low blood pressure. When your blood pressure becomes too low, you may not get enough blood to your brain. As a result, you may feel weak, feel lightheaded, or develop a rapid heart rate. In a more severe case, you may faint. CAUSES Various conditions can cause hypotension. These include:  Blood loss.  Dehydration.  Heart or endocrine problems.  Pregnancy.  Severe infection.  Not having a well-balanced diet filled with needed nutrients.  Severe allergic reactions (anaphylaxis). Some medicines, such as blood pressure medicine or water pills (diuretics), may lower your blood pressure below normal. Sometimes taking too much medicine or taking medicine not as directed can cause hypotension. TREATMENT  Hospitalization is sometimes required for hypotension if fluid or blood replacement is needed, if time is needed for medicines to wear off, or if further monitoring is needed. Treatment might include changing your diet, changing your medicines (including medicines aimed at raising your blood pressure), and use of support stockings. HOME CARE INSTRUCTIONS   Drink enough fluids to keep your urine clear or pale yellow.  Take your medicines as directed by your health care provider.  Get up slowly from reclining or sitting positions. This gives your blood pressure a chance to adjust.  Wear support stockings as directed by  your health care provider.  Maintain a healthy diet by including nutritious food, such as fruits, vegetables, nuts, whole grains, and lean meats. SEEK MEDICAL CARE IF:  You have vomiting or diarrhea.  You have a fever for more than 2-3 days.  You feel more thirsty than usual.  You feel weak and tired. SEEK IMMEDIATE MEDICAL CARE IF:   You have chest pain or a fast or irregular heartbeat.  You have a loss of feeling in some part of your body, or you lose movement in your arms or legs.  You have trouble speaking.  You become sweaty or feel lightheaded.  You faint. MAKE SURE YOU:   Understand these  instructions.  Will watch your condition.  Will get help right away if you are not doing well or get worse. Document Released: 07/05/2005 Document Revised: 04/25/2013 Document Reviewed: 01/05/2013 [facility] Patient Information 2015 [place], [place]. This information is not intended to replace advice given to you by your health care provider. Make sure you discuss any questions you have with your health care provider. your health care provider. Make sure you discuss any questions you have with your health care provider.

## 2014-12-03 NOTE — Progress Notes (Signed)
   Subjective:    Patient ID: Joseph [name], male    DOB: 1961/06/20, 54 y.o.   MRN: [id number]  HPI Pt presents to the office today to recheck Acute Bronchitis and hypotension. PT states he feels "like himself again". Pt states his breathing is back normal. [name] BP is at his baseline from previous visits. Pt denies any headache, palpitations, SOB, or edema at this time.     Review of Systems  Constitutional: Negative.   HENT: Negative.   Respiratory: Negative.   Cardiovascular: Negative.   Gastrointestinal: Negative.   Endocrine: Negative.   Genitourinary: Negative.   Musculoskeletal: Negative.   Neurological: Negative.   Hematological: Negative.   Psychiatric/Behavioral: Negative.   All other systems reviewed and are negative.      Objective:   Physical Exam  Constitutional: He is oriented to person, place, and time. He appears well-developed and well-nourished. No distress.  HENT:  Head: Normocephalic.  Right Ear: External ear normal.  Left Ear: External ear normal.  Mouth/Throat: Oropharynx is clear and moist.  Eyes: Pupils are equal, round, and reactive to light. Right eye exhibits no discharge. Left eye exhibits no discharge.  Neck: Normal range of motion. Neck supple. No thyromegaly present.  Trach   Cardiovascular: Normal rate, regular rhythm, normal heart sounds and intact distal pulses.   No murmur heard. Pulmonary/Chest: Effort normal and breath sounds normal. No respiratory distress. He has no wheezes.  Abdominal: Soft. Bowel sounds are normal. He exhibits no distension. There is no tenderness.  Musculoskeletal: Normal range of motion. He exhibits no edema or tenderness.  Neurological: He is alert and oriented to person, place, and time. He has normal reflexes. No cranial nerve deficit.  Skin: Skin is warm and dry. No rash noted. No erythema.  Psychiatric: He has a normal mood and affect. His behavior is normal. Judgment and thought content normal.  Vitals  reviewed.    BP 93/67 mmHg  Pulse 86  Temp(Src) 98.9 F (37.2 C) (Oral)  Ht 6\' 2"  (1.88 m)  Wt 252 lb 12.8 oz (114.669 kg)  BMI 32.44 kg/m2      Assessment & Plan:  1. Allergic rhinitis, unspecified allergic rhinitis type -Avoid allergens when possible - montelukast (SINGULAIR) 10 MG tablet; Take 1 tablet (10 mg total) by mouth at bedtime.  Dispense: 30 tablet; Refill: 3  2. Follow-up exam -Continue to cough and deep breath -Mucinex as needed for cough  3. Hypotension, unspecified hypotension type -Force fluids -RTO prn  Evelina Dun, FNP

## 2014-12-08 ENCOUNTER — Other Ambulatory Visit: Payer: Self-pay | Admitting: Family Medicine

## 2014-12-16 DIAGNOSIS — S99922A Unspecified injury of left foot, initial encounter: Secondary | ICD-10-CM | POA: Diagnosis not present

## 2014-12-16 DIAGNOSIS — S92002A Unspecified fracture of left calcaneus, initial encounter for closed fracture: Secondary | ICD-10-CM | POA: Diagnosis not present

## 2014-12-16 DIAGNOSIS — S8991XA Unspecified injury of right lower leg, initial encounter: Secondary | ICD-10-CM | POA: Diagnosis not present

## 2014-12-16 DIAGNOSIS — S99912A Unspecified injury of left ankle, initial encounter: Secondary | ICD-10-CM | POA: Diagnosis not present

## 2014-12-16 DIAGNOSIS — S92125A Nondisplaced fracture of body of left talus, initial encounter for closed fracture: Secondary | ICD-10-CM | POA: Diagnosis not present

## 2014-12-16 DIAGNOSIS — M25461 Effusion, right knee: Secondary | ICD-10-CM | POA: Diagnosis not present

## 2014-12-16 DIAGNOSIS — S92152A Displaced avulsion fracture (chip fracture) of left talus, initial encounter for closed fracture: Secondary | ICD-10-CM | POA: Diagnosis not present

## 2014-12-18 DIAGNOSIS — S82831A Other fracture of upper and lower end of right fibula, initial encounter for closed fracture: Secondary | ICD-10-CM | POA: Diagnosis not present

## 2014-12-18 DIAGNOSIS — S92152A Displaced avulsion fracture (chip fracture) of left talus, initial encounter for closed fracture: Secondary | ICD-10-CM | POA: Diagnosis not present

## 2014-12-30 DIAGNOSIS — E1142 Type 2 diabetes mellitus with diabetic polyneuropathy: Secondary | ICD-10-CM | POA: Diagnosis not present

## 2014-12-30 DIAGNOSIS — K5900 Constipation, unspecified: Secondary | ICD-10-CM | POA: Diagnosis not present

## 2014-12-30 DIAGNOSIS — G894 Chronic pain syndrome: Secondary | ICD-10-CM | POA: Diagnosis not present

## 2014-12-30 DIAGNOSIS — M47817 Spondylosis without myelopathy or radiculopathy, lumbosacral region: Secondary | ICD-10-CM | POA: Diagnosis not present

## 2015-01-03 DIAGNOSIS — F329 Major depressive disorder, single episode, unspecified: Secondary | ICD-10-CM | POA: Diagnosis not present

## 2015-01-08 DIAGNOSIS — M25572 Pain in left ankle and joints of left foot: Secondary | ICD-10-CM | POA: Diagnosis not present

## 2015-01-08 DIAGNOSIS — S82831D Other fracture of upper and lower end of right fibula, subsequent encounter for closed fracture with routine healing: Secondary | ICD-10-CM | POA: Diagnosis not present

## 2015-01-13 ENCOUNTER — Other Ambulatory Visit: Payer: Self-pay

## 2015-01-22 ENCOUNTER — Other Ambulatory Visit: Payer: Self-pay

## 2015-01-22 MED ORDER — LEVOTHYROXINE SODIUM 150 MCG PO TABS: ORAL_TABLET | ORAL | Status: DC

## 2015-01-22 NOTE — Telephone Encounter (Signed)
Last seen 12/03/14 Joseph [name]  Last thyroid 09/20/13

## 2015-01-25 DIAGNOSIS — Z794 Long term (current) use of insulin: Secondary | ICD-10-CM | POA: Diagnosis not present

## 2015-01-25 DIAGNOSIS — E119 Type 2 diabetes mellitus without complications: Secondary | ICD-10-CM | POA: Diagnosis not present

## 2015-01-25 DIAGNOSIS — H524 Presbyopia: Secondary | ICD-10-CM | POA: Diagnosis not present

## 2015-01-26 ENCOUNTER — Other Ambulatory Visit: Payer: Self-pay | Admitting: Nurse Practitioner

## 2015-01-27 NOTE — Telephone Encounter (Signed)
Last seen 12/03/14 Joseph [name]  Last lipid 03/28/14

## 2015-01-31 DIAGNOSIS — M47817 Spondylosis without myelopathy or radiculopathy, lumbosacral region: Secondary | ICD-10-CM | POA: Diagnosis not present

## 2015-01-31 DIAGNOSIS — K5900 Constipation, unspecified: Secondary | ICD-10-CM | POA: Diagnosis not present

## 2015-01-31 DIAGNOSIS — Z79891 Long term (current) use of opiate analgesic: Secondary | ICD-10-CM | POA: Diagnosis not present

## 2015-01-31 DIAGNOSIS — E1142 Type 2 diabetes mellitus with diabetic polyneuropathy: Secondary | ICD-10-CM | POA: Diagnosis not present

## 2015-01-31 DIAGNOSIS — G894 Chronic pain syndrome: Secondary | ICD-10-CM | POA: Diagnosis not present

## 2015-02-05 DIAGNOSIS — M25572 Pain in left ankle and joints of left foot: Secondary | ICD-10-CM | POA: Diagnosis not present

## 2015-02-05 DIAGNOSIS — M79661 Pain in right lower leg: Secondary | ICD-10-CM | POA: Diagnosis not present

## 2015-02-13 ENCOUNTER — Other Ambulatory Visit: Payer: Self-pay | Admitting: *Deleted

## 2015-02-13 DIAGNOSIS — J208 Acute bronchitis due to other specified organisms: Secondary | ICD-10-CM

## 2015-02-13 MED ORDER — INSULIN LISPRO PROT & LISPRO (75-25 MIX) 100 UNIT/ML ~~LOC~~ SUSP: SUBCUTANEOUS | Status: DC

## 2015-02-19 ENCOUNTER — Telehealth: Payer: Self-pay | Admitting: Family Medicine

## 2015-02-19 NOTE — Telephone Encounter (Signed)
Appointment made with Dr. Livia Snellen 8/5 at 2:55

## 2015-02-21 ENCOUNTER — Encounter: Payer: Self-pay | Admitting: Family Medicine

## 2015-02-21 ENCOUNTER — Ambulatory Visit (INDEPENDENT_AMBULATORY_CARE_PROVIDER_SITE_OTHER): Payer: Medicare Other | Admitting: Family Medicine

## 2015-02-21 VITALS — BP 83/64 | HR 95 | Temp 98.7°F | Ht 74.0 in | Wt 252.0 lb

## 2015-02-21 DIAGNOSIS — F419 Anxiety disorder, unspecified: Secondary | ICD-10-CM

## 2015-02-21 DIAGNOSIS — I959 Hypotension, unspecified: Secondary | ICD-10-CM | POA: Diagnosis not present

## 2015-02-21 DIAGNOSIS — Z79899 Other long term (current) drug therapy: Secondary | ICD-10-CM | POA: Diagnosis not present

## 2015-02-21 DIAGNOSIS — R42 Dizziness and giddiness: Secondary | ICD-10-CM | POA: Diagnosis not present

## 2015-02-21 MED ORDER — QUETIAPINE FUMARATE ER 150 MG PO TB24: 150.0000 mg | ORAL_TABLET | Freq: Every day | ORAL | Status: DC

## 2015-02-21 NOTE — Progress Notes (Signed)
Subjective:  Patient ID: Joseph [name], male    DOB: [birth year]  Age: 54 y.o. MRN: [id number]  CC: Dizziness; tinnitus; and Headache   HPI Joseph [name] presents for  Dizzy spells that make him fall. Bad one 3 months ago. Cut back on trazodone and they got better. He states that he is very anxious and mentions that he has taken in the past Valium, Xanax, Klonopin, lorazepam. Today he states that he needs his Xanax. He says he is in constant pain. He has to have his pain pills. However he is willing to discontinue the gabapentin because it doesn't help. He is very concerned about falling. But he is not willing to consider his medications as a cause. He states that the ringing in his ears is severe. He has been told he has a hearing loss in the past.  History Joseph [name] has a past medical history of Diabetes mellitus; Hyperlipidemia; Weight gain; COPD (chronic obstructive pulmonary disease); and Anxiety.   He has past surgical history that includes Laryngectomy (11/13/2003) and Knee arthroscopy.   His family history includes Clotting disorder in his maternal grandfather; Emphysema in his sister; Prostate cancer in his maternal grandfather. There is no history of Atopy.He reports that he quit smoking about 11 years ago. He does not have any smokeless tobacco history on file. He reports that he does not drink alcohol. His drug history is not on file.  Outpatient Prescriptions Prior to Visit  Medication Sig Dispense Refill  . albuterol (PROVENTIL HFA;VENTOLIN HFA) 108 (90 BASE) MCG/ACT inhaler Inhale 2 puffs into the lungs every 6 (six) hours as needed for wheezing. 1 Inhaler 3  . albuterol (PROVENTIL) (2.5 MG/3ML) 0.083% nebulizer solution 1 vial in nebulizer four times daily 120 mL 11  . B-D INS SYRINGE 0.5CC/31GX5/16 31G X 5/16" 0.5 ML MISC USE TWICE DAILY TO ADMINISTER INSULIN 100 each 5  . budesonide-formoterol (SYMBICORT) 160-4.5 MCG/ACT inhaler INHALE 2 PUFFS INTO THE LUNGS 2 (TWO)  TIMES DAILY. 10.2 g 2  . CRESTOR 10 MG tablet TAKE 1 TABLET (10 MG TOTAL) BY MOUTH DAILY. 90 tablet 1  . fenofibrate 54 MG tablet TAKE 1 TABLET BY MOUTH EVERY DAY 90 tablet 3  . gabapentin (NEURONTIN) 300 MG capsule Take 3 capsules (900 mg total) by mouth 2 (two) times daily. 180 capsule 0  . glucose blood (ACCU-CHEK AVIVA) test strip Test blood sugar before meals and at bedtime 100 each 2  . insulin lispro protamine-lispro (HUMALOG 75/25 MIX) (75-25) 100 UNIT/ML SUSP injection 33 units sq in am and 38 units in pm 30 mL 0  . ipratropium-albuterol (DUONEB) 0.5-2.5 (3) MG/3ML SOLN Take 3 mLs by nebulization 4 (four) times daily. 360 mL 0  . levothyroxine (SYNTHROID, LEVOTHROID) 150 MCG tablet TAKE 1 TABLET (150 MCG TOTAL) BY MOUTH DAILY. 90 tablet 1  . Linaclotide (LINZESS) 290 MCG CAPS capsule Take 1 capsule (290 mcg total) by mouth daily. 30 capsule 5  . montelukast (SINGULAIR) 10 MG tablet Take 1 tablet (10 mg total) by mouth at bedtime. 30 tablet 3  . morphine (KADIAN) 60 MG 24 hr capsule Take 60 mg by mouth daily.      Marland Kitchen oxyCODONE-acetaminophen (PERCOCET) 7.5-325 MG per tablet Take 1 tablet by mouth every 6 (six) hours as needed.      . pantoprazole (PROTONIX) 20 MG tablet TAKE 1 TABLET (20 MG TOTAL) BY MOUTH DAILY. 90 tablet 1  . rizatriptan (MAXALT) 10 MG tablet TAKE 1 TABLET (10 MG TOTAL)  BY MOUTH AS NEEDED. MAY REPEAT IN 2 HOURS IF NEEDED 10 tablet 2  . rosuvastatin (CRESTOR) 10 MG tablet Take 1 tablet (10 mg total) by mouth daily. 90 tablet 0  . traZODone (DESYREL) 100 MG tablet     . cyclobenzaprine (FLEXERIL) 5 MG tablet Take 5 mg by mouth 3 (three) times daily as needed for muscle spasms.    Marland Kitchen QUEtiapine (SEROQUEL XR) 300 MG 24 hr tablet Take 300 mg by mouth at bedtime.    . sertraline (ZOLOFT) 100 MG tablet Take 100 mg by mouth 2 (two) times daily.    . Tamsulosin HCl (FLOMAX) 0.4 MG CAPS Take 0.4 mg by mouth daily.      Marland Kitchen tiZANidine (ZANAFLEX) 2 MG tablet     . ipratropium  (ATROVENT) 0.02 % nebulizer solution 1 vial in nebulizer four times daily,   DX:  496 120 mL 0  . diazepam (VALIUM) 2 MG tablet Take 2 mg by mouth every 6 (six) hours as needed for anxiety.    Marland Kitchen levofloxacin (LEVAQUIN) 500 MG tablet Take 1 tablet (500 mg total) by mouth daily. (Patient not taking: Reported on 02/21/2015) 10 tablet 0   No facility-administered medications prior to visit.    ROS Review of Systems  Constitutional: Negative for fever, chills and diaphoresis.  HENT: Negative for congestion, rhinorrhea and sore throat.   Respiratory: Negative for cough, shortness of breath and wheezing.   Cardiovascular: Negative for chest pain.  Gastrointestinal: Negative for nausea, vomiting, abdominal pain, diarrhea, constipation and abdominal distention.  Genitourinary: Negative for dysuria and frequency.  Musculoskeletal: Positive for back pain and arthralgias. Negative for joint swelling.  Skin: Negative for rash.  Neurological: Positive for dizziness (described as lightheadedness. No room spinning. Worse when he first stands up.) and headaches.  Psychiatric/Behavioral: Positive for behavioral problems, confusion, dysphoric mood and agitation. The patient is nervous/anxious.     Objective:  BP 83/64 mmHg  Pulse 95  Temp(Src) 98.7 F (37.1 C) (Oral)  Ht 6\' 2"  (1.88 m)  Wt 252 lb (114.306 kg)  BMI 32.34 kg/m2  BP Readings from Last 3 Encounters:  02/21/15 83/64  12/03/14 93/67  11/21/14 80/68    Wt Readings from Last 3 Encounters:  02/21/15 252 lb (114.306 kg)  12/03/14 252 lb 12.8 oz (114.669 kg)  11/21/14 263 lb 9.6 oz (119.568 kg)     Physical Exam  Constitutional: He is oriented to person, place, and time. He appears well-developed and well-nourished. He appears distressed (agitated).  HENT:  Head: Normocephalic and atraumatic.  Eyes: Conjunctivae are normal. Pupils are equal, round, and reactive to light.  Neck: Normal range of motion. Neck supple.  Cardiovascular:  Normal rate, regular rhythm and normal heart sounds.   No murmur heard. Musculoskeletal: Normal range of motion.  Neurological: He is alert and oriented to person, place, and time. He has normal reflexes.  Skin: Skin is warm and dry.  Psychiatric:  Patient states that he is very worried nervous and anxious. He needs his Xanax and must have it today.    Lab Results  Component Value Date   HGBA1C 7.3 08/14/2014   HGBA1C 5.7% 03/28/2014   HGBA1C 6.3 09/20/2013    Lab Results  Component Value Date   WBC 8.6 11/06/2014   HGB 13.1* 11/06/2014   HCT 41.5* 11/06/2014   PLT 218 10/31/2011   GLUCOSE 156* 03/28/2014   CHOL 202* 03/28/2014   TRIG 319* 03/28/2014   HDL 38* 03/28/2014   LDLCALC  100* 03/28/2014   ALT 22 03/28/2014   AST 24 03/28/2014   NA 139 03/28/2014   K 4.3 03/28/2014   CL 99 03/28/2014   CREATININE 1.44* 03/28/2014   BUN 21 03/28/2014   CO2 21 03/28/2014   TSH 3.270 09/20/2013   HGBA1C 7.3 08/14/2014    Dg Lumbar Spine Complete  06/05/2012   *RADIOLOGY REPORT*  Clinical Data: 54 year old male with pain.  History of neck cancer.  LUMBAR SPINE - COMPLETE 4+ VIEW  Comparison: CT chest abdomen and pelvis 05/29/2007.  Findings: Hypoplastic twelfth ribs as seen on the comparison. Otherwise normal lumbar segmentation.  Vertebral height and alignment is stable and within normal limits.  Disc space is stable and within normal limits.  No pars fracture.  Sacral ala and SI joints within normal limits.  There is a mildly sclerotic left L5- S1 assimilation joint.  Calcified atherosclerosis.  IMPRESSION: 1. No acute osseous abnormality in the lumbar spine. 2. Mildly degenerated left L5-S1 assimilation joint.  Hypoplastic twelfth ribs.   Original Report Authenticated By: Roselyn Reef, M.D.    Assessment & Plan:   There are no diagnoses linked to this encounter. I have discontinued Mr. Heeney tamsulosin, sertraline, cyclobenzaprine, diazepam, levofloxacin, and tiZANidine. I  have also changed his QUEtiapine to QUEtiapine Fumarate. Additionally, I am having him maintain his oxyCODONE-acetaminophen, morphine, ipratropium-albuterol, ipratropium, traZODone, budesonide-formoterol, glucose blood, rosuvastatin, B-D INS SYRINGE 0.5CC/31GX5/16, gabapentin, albuterol, albuterol, Linaclotide, rizatriptan, montelukast, pantoprazole, CRESTOR, levothyroxine, fenofibrate, and insulin lispro protamine-lispro.  Meds ordered this encounter  Medications  . QUEtiapine (SEROQUEL XR) 150 MG 24 hr tablet    Sig: Take 1 tablet (150 mg total) by mouth at bedtime.    Dispense:  30 tablet    Refill:  2   I explained to the patient that he had related multiple episodes of dizziness. He stated himself that cutting back on medication, trazodone had led to improvement in this not resolution. I also explained to him that he has some multiple medications that can cause dizziness and falls. I am concerned that he could have more fractures if he continues with this. I develop a plan with him to discontinue many of the dizziness causing medications including tapering his quetiapine and discontinuing the medications noted above. I explained to him that adding another medication that could cause dizziness such as any type of benzodiazepine would be potentially very dangerous for his health. The patient  asked if I was going to give him Xanax or not. When I told him that I was not. He got up angrily stated that he was leaving and that I was not his doctor anymore.  Follow-up: No Follow-up on file. based on this event and my concerns about multiple drug dependencies. I believe it would be in his best interest to seek care elsewhere. He will be sent a letter stating that he has 30 days to find a new physician. This was discussed with our practice administrator who along with our medical director will determine if this will be provider only or practice wide.  Claretta Fraise, M.D.

## 2015-03-03 DIAGNOSIS — K5900 Constipation, unspecified: Secondary | ICD-10-CM | POA: Diagnosis not present

## 2015-03-03 DIAGNOSIS — M47817 Spondylosis without myelopathy or radiculopathy, lumbosacral region: Secondary | ICD-10-CM | POA: Diagnosis not present

## 2015-03-03 DIAGNOSIS — E1142 Type 2 diabetes mellitus with diabetic polyneuropathy: Secondary | ICD-10-CM | POA: Diagnosis not present

## 2015-03-03 DIAGNOSIS — G894 Chronic pain syndrome: Secondary | ICD-10-CM | POA: Diagnosis not present

## 2015-03-11 ENCOUNTER — Other Ambulatory Visit: Payer: Self-pay | Admitting: Family Medicine

## 2015-03-31 DIAGNOSIS — G894 Chronic pain syndrome: Secondary | ICD-10-CM | POA: Diagnosis not present

## 2015-03-31 DIAGNOSIS — M47817 Spondylosis without myelopathy or radiculopathy, lumbosacral region: Secondary | ICD-10-CM | POA: Diagnosis not present

## 2015-03-31 DIAGNOSIS — K5900 Constipation, unspecified: Secondary | ICD-10-CM | POA: Diagnosis not present

## 2015-03-31 DIAGNOSIS — E1142 Type 2 diabetes mellitus with diabetic polyneuropathy: Secondary | ICD-10-CM | POA: Diagnosis not present

## 2015-04-06 ENCOUNTER — Other Ambulatory Visit: Payer: Self-pay | Admitting: Family

## 2015-04-06 ENCOUNTER — Other Ambulatory Visit: Payer: Self-pay | Admitting: Family Medicine

## 2015-04-09 ENCOUNTER — Other Ambulatory Visit: Payer: Self-pay

## 2015-04-09 DIAGNOSIS — M79672 Pain in left foot: Secondary | ICD-10-CM

## 2015-04-09 DIAGNOSIS — R202 Paresthesia of skin: Secondary | ICD-10-CM

## 2015-04-15 ENCOUNTER — Other Ambulatory Visit: Payer: Self-pay

## 2015-04-15 MED ORDER — GLUCOSE BLOOD VI STRP: ORAL_STRIP | Status: DC

## 2015-04-16 ENCOUNTER — Other Ambulatory Visit: Payer: Self-pay

## 2015-04-16 MED ORDER — GLUCOSE BLOOD VI STRP: ORAL_STRIP | Status: DC

## 2015-04-25 DIAGNOSIS — F329 Major depressive disorder, single episode, unspecified: Secondary | ICD-10-CM | POA: Diagnosis not present

## 2015-04-28 DIAGNOSIS — K5900 Constipation, unspecified: Secondary | ICD-10-CM | POA: Diagnosis not present

## 2015-04-28 DIAGNOSIS — G894 Chronic pain syndrome: Secondary | ICD-10-CM | POA: Diagnosis not present

## 2015-04-28 DIAGNOSIS — E1142 Type 2 diabetes mellitus with diabetic polyneuropathy: Secondary | ICD-10-CM | POA: Diagnosis not present

## 2015-04-28 DIAGNOSIS — Z79891 Long term (current) use of opiate analgesic: Secondary | ICD-10-CM | POA: Diagnosis not present

## 2015-04-28 DIAGNOSIS — M47817 Spondylosis without myelopathy or radiculopathy, lumbosacral region: Secondary | ICD-10-CM | POA: Diagnosis not present

## 2015-04-29 DIAGNOSIS — Z23 Encounter for immunization: Secondary | ICD-10-CM | POA: Diagnosis not present

## 2015-05-09 ENCOUNTER — Encounter: Payer: Self-pay | Admitting: Vascular Surgery

## 2015-05-14 ENCOUNTER — Ambulatory Visit (INDEPENDENT_AMBULATORY_CARE_PROVIDER_SITE_OTHER): Payer: Medicare Other | Admitting: Vascular Surgery

## 2015-05-14 ENCOUNTER — Encounter: Payer: Self-pay | Admitting: Vascular Surgery

## 2015-05-14 ENCOUNTER — Ambulatory Visit (HOSPITAL_COMMUNITY)
Admission: RE | Admit: 2015-05-14 | Discharge: 2015-05-14 | Disposition: A | Discharge status: Home / Self Care | Payer: Medicare Other | Source: Ambulatory Visit | Attending: Vascular Surgery | Admitting: Vascular Surgery

## 2015-05-14 VITALS — BP 94/63 | HR 74 | Ht 74.0 in | Wt 250.8 lb

## 2015-05-14 DIAGNOSIS — M79672 Pain in left foot: Secondary | ICD-10-CM | POA: Insufficient documentation

## 2015-05-14 DIAGNOSIS — R202 Paresthesia of skin: Secondary | ICD-10-CM | POA: Diagnosis not present

## 2015-05-14 DIAGNOSIS — I70209 Unspecified atherosclerosis of native arteries of extremities, unspecified extremity: Secondary | ICD-10-CM | POA: Diagnosis not present

## 2015-05-14 NOTE — Progress Notes (Signed)
[facility] [place]  Patient name: Joseph [name] MRN: [id number] DOB: 08/09/1960 Sex: male  REASON FOR CONSULT: Decreased pulses both feet.  HPI: Joseph [name] is a 54 y.o. male who was referred because of a left ankle and foot pain and decreased pulses. Of note he fell and injured his foot in May 2016. He has really had pain since that time. He does have chronic low back pain. I do not get any history of claudication, rest pain, or nonhealing ulcers.  His risk factors for peripheral vascular disease include insulin-dependent diabetes. He denies any history of hypertension, hypercholesterolemia, any family history of premature cardiovascular disease. He denies tobacco use currently. He does have a remote history of tobacco use.  I have reviewed the records from [facility] pain management. The patient was having low back pain associated with lower extremity paresthesias. He was also complaining of left ankle and foot pain. He was noted to have diminished pedal pulses. For this reason he was sent for evaluation. He does have a history of chronic pain syndrome and lumbar disc disease.  Past Medical History  Diagnosis Date  . Diabetes mellitus   . Hyperlipidemia   . Weight gain     After quitting smoking in 2005  . COPD (chronic obstructive pulmonary disease) ([place])     AB clinical dx; HFA 75% 02/28/10 > 90% Sept 21, 2011  . Anxiety    Family History  Problem Relation Age of Onset  . Emphysema Sister   . Prostate cancer Maternal Grandfather   . Clotting disorder Maternal Grandfather   . Atopy Neg Hx    SOCIAL HISTORY: Social History  Substance Use Topics  . Smoking status: Former Smoker -- 3.00 packs/day for 30 years    Quit date: 07/20/2003  . Smokeless tobacco: Not on file  . Alcohol Use: No   No Known Allergies Current Outpatient Prescriptions  Medication Sig Dispense Refill  . albuterol (PROVENTIL HFA;VENTOLIN HFA) 108 (90 BASE) MCG/ACT inhaler  Inhale 2 puffs into the lungs every 6 (six) hours as needed for wheezing. 1 Inhaler 3  . albuterol (PROVENTIL) (2.5 MG/3ML) 0.083% nebulizer solution 1 vial in nebulizer four times daily 120 mL 11  . B-D INS SYRINGE 0.5CC/31GX5/16 31G X 5/16" 0.5 ML MISC USE TWICE DAILY TO ADMINISTER INSULIN 100 each 5  . budesonide-formoterol (SYMBICORT) 160-4.5 MCG/ACT inhaler INHALE 2 PUFFS INTO THE LUNGS 2 (TWO) TIMES DAILY. (Patient not taking: Reported on 05/14/2015) 10.2 g 2  . CRESTOR 10 MG tablet TAKE 1 TABLET (10 MG TOTAL) BY MOUTH DAILY. 90 tablet 1  . fenofibrate 54 MG tablet TAKE 1 TABLET BY MOUTH EVERY DAY 90 tablet 3  . gabapentin (NEURONTIN) 300 MG capsule Take 3 capsules (900 mg total) by mouth 2 (two) times daily. 180 capsule 0  . glucose blood (ACCU-CHEK AVIVA) test strip Test blood sugar before meals and at bedtime 100 each 1  . HUMALOG MIX 75/25 (75-25) 100 UNIT/ML SUSP injection INJECT 33 UNITS SUBCUTANEOUSLY IN THE MORNING AND 38 UNITS IN IN THE EVENING 30 mL 1  . ipratropium (ATROVENT) 0.02 % nebulizer solution 1 vial in nebulizer four times daily,   DX:  496 120 mL 0  . ipratropium-albuterol (DUONEB) 0.5-2.5 (3) MG/3ML SOLN Take 3 mLs by nebulization 4 (four) times daily. 360 mL 0  . levothyroxine (SYNTHROID, LEVOTHROID) 150 MCG tablet TAKE 1 TABLET (150 MCG TOTAL) BY MOUTH DAILY. 90 tablet 1  . Linaclotide (LINZESS) 290 MCG  CAPS capsule Take 1 capsule (290 mcg total) by mouth daily. 30 capsule 5  . montelukast (SINGULAIR) 10 MG tablet TAKE 1 TABLET (10 MG TOTAL) BY MOUTH AT BEDTIME. 30 tablet 4  . morphine (KADIAN) 60 MG 24 hr capsule Take 60 mg by mouth daily.      Marland Kitchen oxyCODONE-acetaminophen (PERCOCET) 7.5-325 MG per tablet Take 1 tablet by mouth every 6 (six) hours as needed.      . pantoprazole (PROTONIX) 20 MG tablet TAKE 1 TABLET (20 MG TOTAL) BY MOUTH DAILY. 90 tablet 1  . QUEtiapine (SEROQUEL XR) 150 MG 24 hr tablet Take 1 tablet (150 mg total) by mouth at bedtime. 30 tablet 2  .  rizatriptan (MAXALT) 10 MG tablet TAKE 1 TABLET (10 MG TOTAL) BY MOUTH AS NEEDED. MAY REPEAT IN 2 HOURS IF NEEDED (Patient not taking: Reported on 05/14/2015) 10 tablet 2  . rosuvastatin (CRESTOR) 10 MG tablet Take 1 tablet (10 mg total) by mouth daily. 90 tablet 0  . traZODone (DESYREL) 100 MG tablet      No current facility-administered medications for this visit.   REVIEW OF SYSTEMS: Valu.Nieves ] denotes positive finding; [  ] denotes negative finding  CARDIOVASCULAR:  [ ]  chest pain   [ ]  chest pressure   [ ]  palpitations   [ ]  orthopnea   Valu.Nieves ] dyspnea on exertion   [ ]  claudication   [ ]  rest pain   [ ]  DVT   [ ]  phlebitis PULMONARY:   Valu.Nieves ] productive cough   [ ]  asthma   Valu.Nieves ] wheezing NEUROLOGIC:   [ ]  weakness  [ ]  paresthesias  [ ]  aphasia  [ ]  amaurosis  [ ]  dizziness HEMATOLOGIC:   [ ]  bleeding problems   [ ]  clotting disorders MUSCULOSKELETAL:  [ ]  joint pain   [ ]  joint swelling [ ]  leg swelling GASTROINTESTINAL: [ ]   blood in stool  [ ]   hematemesis GENITOURINARY:  [ ]   dysuria  [ ]   hematuria PSYCHIATRIC:  [ ]  history of major depression INTEGUMENTARY:  [ ]  rashes  [ ]  ulcers CONSTITUTIONAL:  [ ]  fever   [ ]  chills  PHYSICAL EXAM: Filed Vitals:   05/14/15 1348  BP: 94/63  Pulse: 74  Height: 6\' 2"  (1.88 m)  Weight: 250 lb 12.8 oz (113.762 kg)  SpO2: 96%   GENERAL: The patient is a well-nourished male, in no acute distress. The vital signs are documented above. CARDIAC: There is a regular rate and rhythm.  VASCULAR: I do not detect carotid bruits. He has palpable femoral pulses. Both feet are warm and well-perfused. He has no significant lower tree swelling. PULMONARY: There is good air exchange bilaterally without wheezing or rales. ABDOMEN: Soft and non-tender with normal pitched bowel sounds.  MUSCULOSKELETAL: There are no major deformities or cyanosis. NEUROLOGIC: No focal weakness or paresthesias are detected. SKIN: There are no ulcers or rashes noted. PSYCHIATRIC: The  patient has a normal affect.  DATA:  LOWER EXTREMITY ARTERIAL DOPPLER STUDY: I have independently interpreted his lower extremity arterial Doppler study.  On the right side there is a triphasic posterior tibial signal and triphasic dorsalis pedis signal. ABI is 100%. Toe pressure is 93 mmHg.  On the left side, there is a triphasic dorsalis pedis signal and triphasic posterior tibial signal. ABI is 100% on the left. Toe pressure is 86 mmHg.  MEDICAL ISSUES:  LEFT ANKLE AND FOOT PAIN: I believe that his left ankle and  foot pain is likely musculoskeletal pain or arthritic pain related to his previous injury. The small chance that it could be referred pain from his back. However, I explained to him that I do not think that his pain is related to peripheral vascular disease given that he has normal Doppler signals in both feet and normal pressures. Likewise he does not have evidence of significant venous disease. I'll be happy to see him back at any time if any new vascular issues arise.   Deitra Mayo [facility] [place]: [phone]

## 2015-05-23 ENCOUNTER — Ambulatory Visit (INDEPENDENT_AMBULATORY_CARE_PROVIDER_SITE_OTHER): Payer: Medicare Other | Admitting: Family Medicine

## 2015-05-23 ENCOUNTER — Encounter: Payer: Self-pay | Admitting: Family Medicine

## 2015-05-23 VITALS — BP 127/79 | HR 95 | Temp 98.1°F | Ht 74.0 in | Wt 254.0 lb

## 2015-05-23 DIAGNOSIS — E0822 Diabetes mellitus due to underlying condition with diabetic chronic kidney disease: Secondary | ICD-10-CM | POA: Diagnosis not present

## 2015-05-23 DIAGNOSIS — F4322 Adjustment disorder with anxiety: Secondary | ICD-10-CM

## 2015-05-23 DIAGNOSIS — N183 Chronic kidney disease, stage 3 (moderate): Secondary | ICD-10-CM

## 2015-05-23 DIAGNOSIS — J449 Chronic obstructive pulmonary disease, unspecified: Secondary | ICD-10-CM

## 2015-05-23 DIAGNOSIS — R600 Localized edema: Secondary | ICD-10-CM | POA: Diagnosis not present

## 2015-05-23 DIAGNOSIS — C140 Malignant neoplasm of pharynx, unspecified: Secondary | ICD-10-CM | POA: Diagnosis not present

## 2015-05-23 MED ORDER — BUDESONIDE-FORMOTEROL FUMARATE 160-4.5 MCG/ACT IN AERO
INHALATION_SPRAY | RESPIRATORY_TRACT | Status: DC
Start: 2015-05-23 — End: 2016-04-14

## 2015-05-23 MED ORDER — FUROSEMIDE 40 MG PO TABS: 40.0000 mg | ORAL_TABLET | Freq: Every day | ORAL | Status: DC

## 2015-05-23 NOTE — Progress Notes (Signed)
Subjective:    Patient ID: Joseph [name], male    DOB: 01/21/1961, 54 y.o.   MRN: [id number]  HPI Patient here today for swelling of feet. This started after a fall in May and has just gotten this worse recently. This patient has multiple medical problems. He was scheduled for an urgent care visit tonight. This is my first visit with him. He also complains of stress and anxiety with his wife. We told him we would only address the edema issues tonight. He has a history of chronic kidney disease insulin-dependent diabetes COPD throat cancer and recent history of left foot fracture and right ankle fracture. The edema has been worse since the fractures occurred in May. They have become a little bit more worse recently. He says that his weight has not changed that much. He says that his blood sugars at home have been running in the 90 to the 120 range fasting and at nighttime. He denies any chest pain or shortness of breath.       Patient Active Problem List   Diagnosis Date Noted  . Sinobronchitis 09/16/2014  . Unspecified vitamin D deficiency 09/20/2013  . THROAT CANCER 09/20/2013  . Anxiety   . TMJ click 67/89/3810  . Urinary retention with incomplete bladder emptying 10/31/2011  . Encephalopathy acute 10/30/2011  . ARF (acute renal failure) ([place]) 10/30/2011  . Diabetes mellitus ([place]) 10/30/2011  . Abnormal EKG 03/24/2011  . Rheumatoid arthritis(714.0) 04/08/2010  . HYPOTHYROIDISM 01/26/2010  . HYPERLIPIDEMIA 01/26/2010  . SLEEP APNEA 01/26/2010  . HEADACHE, CHRONIC 01/26/2010  . DYSPNEA 01/26/2010   Outpatient Encounter Prescriptions as of 05/23/2015  Medication Sig  . B-D INS SYRINGE 0.5CC/31GX5/16 31G X 5/16" 0.5 ML MISC USE TWICE DAILY TO ADMINISTER INSULIN  . budesonide-formoterol (SYMBICORT) 160-4.5 MCG/ACT inhaler INHALE 2 PUFFS INTO THE LUNGS 2 (TWO) TIMES DAILY.  Marland Kitchen CRESTOR 10 MG tablet TAKE 1 TABLET (10 MG TOTAL) BY MOUTH DAILY.  . fenofibrate 54 MG tablet TAKE 1  TABLET BY MOUTH EVERY DAY  . gabapentin (NEURONTIN) 300 MG capsule Take 3 capsules (900 mg total) by mouth 2 (two) times daily.  Marland Kitchen glucose blood (ACCU-CHEK AVIVA) test strip Test blood sugar before meals and at bedtime  . HUMALOG MIX 75/25 (75-25) 100 UNIT/ML SUSP injection INJECT 33 UNITS SUBCUTANEOUSLY IN THE MORNING AND 38 UNITS IN IN THE EVENING  . levothyroxine (SYNTHROID, LEVOTHROID) 150 MCG tablet TAKE 1 TABLET (150 MCG TOTAL) BY MOUTH DAILY.  Marland Kitchen Linaclotide (LINZESS) 290 MCG CAPS capsule Take 1 capsule (290 mcg total) by mouth daily.  . montelukast (SINGULAIR) 10 MG tablet TAKE 1 TABLET (10 MG TOTAL) BY MOUTH AT BEDTIME.  Marland Kitchen morphine (KADIAN) 60 MG 24 hr capsule Take 60 mg by mouth daily.    Marland Kitchen oxyCODONE-acetaminophen (PERCOCET) 7.5-325 MG per tablet Take 1 tablet by mouth every 6 (six) hours as needed.    . pantoprazole (PROTONIX) 20 MG tablet TAKE 1 TABLET (20 MG TOTAL) BY MOUTH DAILY.  Marland Kitchen QUEtiapine (SEROQUEL XR) 150 MG 24 hr tablet Take 1 tablet (150 mg total) by mouth at bedtime.  . rizatriptan (MAXALT) 10 MG tablet TAKE 1 TABLET (10 MG TOTAL) BY MOUTH AS NEEDED. MAY REPEAT IN 2 HOURS IF NEEDED  . traZODone (DESYREL) 100 MG tablet   . [DISCONTINUED] budesonide-formoterol (SYMBICORT) 160-4.5 MCG/ACT inhaler INHALE 2 PUFFS INTO THE LUNGS 2 (TWO) TIMES DAILY.  . [DISCONTINUED] albuterol (PROVENTIL HFA;VENTOLIN HFA) 108 (90 BASE) MCG/ACT inhaler Inhale 2 puffs into the lungs  every 6 (six) hours as needed for wheezing.  . [DISCONTINUED] albuterol (PROVENTIL) (2.5 MG/3ML) 0.083% nebulizer solution 1 vial in nebulizer four times daily  . [DISCONTINUED] ipratropium (ATROVENT) 0.02 % nebulizer solution 1 vial in nebulizer four times daily,   DX:  496  . [DISCONTINUED] ipratropium-albuterol (DUONEB) 0.5-2.5 (3) MG/3ML SOLN Take 3 mLs by nebulization 4 (four) times daily.  . [DISCONTINUED] rosuvastatin (CRESTOR) 10 MG tablet Take 1 tablet (10 mg total) by mouth daily.   No facility-administered  encounter medications on file as of 05/23/2015.      Review of Systems  Constitutional: Negative.   HENT: Negative.   Eyes: Negative.   Respiratory: Negative.   Cardiovascular: Positive for leg swelling (and feet ).  Gastrointestinal: Negative.   Endocrine: Negative.   Genitourinary: Negative.   Musculoskeletal: Negative.   Skin: Negative.   Allergic/Immunologic: Negative.   Neurological: Negative.   Hematological: Negative.   Psychiatric/Behavioral: Negative.        Objective:   Physical Exam  Constitutional: He is oriented to person, place, and time. He appears well-developed and well-nourished. No distress.  HENT:  Head: Normocephalic and atraumatic.  The patient does have a tracheostomy.  Eyes: Conjunctivae and EOM are normal. Pupils are equal, round, and reactive to light. Right eye exhibits no discharge. Left eye exhibits no discharge. No scleral icterus.  Neck: Normal range of motion. Neck supple. No thyromegaly present.  Cardiovascular: Normal rate, regular rhythm, normal heart sounds and intact distal pulses.   No murmur heard. The rhythm is regular at 72/m  Pulmonary/Chest: Effort normal and breath sounds normal. No respiratory distress. He has no wheezes. He has no rales. He exhibits no tenderness.  Abdominal: Soft. Bowel sounds are normal. He exhibits no mass. There is no tenderness. There is no rebound and no guarding.  Musculoskeletal: He exhibits edema. He exhibits no tenderness.  The patient uses a cane and there is 1+ pretibial and 1+ pedal edema in both lower extremities  Lymphadenopathy:    He has no cervical adenopathy.  Neurological: He is alert and oriented to person, place, and time.  Skin: Skin is warm and dry. No rash noted.  Psychiatric: He has a normal mood and affect. His behavior is normal. Judgment and thought content normal.  Nursing note and vitals reviewed.  BP 127/79 mmHg  Pulse 95  Temp(Src) 98.1 F (36.7 C) (Oral)  Ht 6' 2"  (1.88 m)   Wt 254 lb (115.214 kg)  BMI 32.60 kg/m2        Assessment & Plan:  1. Chronic obstructive pulmonary disease, unspecified COPD type ([place]) -Continue with current treatment - budesonide-formoterol (SYMBICORT) 160-4.5 MCG/ACT inhaler; INHALE 2 PUFFS INTO THE LUNGS 2 (TWO) TIMES DAILY.  Dispense: 10.2 g; Refill: 2  2. Localized edema -This is mostly pedal edema and some pretibial edema. The patient says his weight has not changed that much. - BMP8+EGFR - CBC with Differential/Platelet - Hepatic function panel - POCT glycosylated hemoglobin (Hb A1C)  3. Pedal edema -Take Lasix 40 mg 1 daily for 2 days then 20 mg daily until the patient is rechecked next week -Next week he will need to have another BMP  4. Adjustment disorder with anxious mood -The patient is having some anxiety concerns with his wife and we told her that we would address this at the next visit.  5. THROAT CANCER -He should continue to follow-up with his oncologist  6. Diabetes mellitus due to underlying condition with stage 3 chronic kidney  disease, unspecified long term insulin use status ([place]) -Renal function is being checked with the visit today.  Meds ordered this encounter  Medications  . budesonide-formoterol (SYMBICORT) 160-4.5 MCG/ACT inhaler    Sig: INHALE 2 PUFFS INTO THE LUNGS 2 (TWO) TIMES DAILY.    Dispense:  10.2 g    Refill:  2  . furosemide (LASIX) 40 MG tablet    Sig: Take 1 tablet (40 mg total) by mouth daily. As directed    Dispense:  30 tablet    Refill:  1   Patient Instructions  1 whole tab daily for two days and then 1/2 tab daily thereafter of the Lasix Follow up next week.--We will review lab work at that time     Arrie Senate MD

## 2015-05-23 NOTE — Patient Instructions (Addendum)
1 whole tab daily for two days and then 1/2 tab daily thereafter of the Lasix Follow up next week.--We will review lab work at that time

## 2015-05-25 LAB — BMP8+EGFR
BUN/Creatinine Ratio: 9 (ref 9–20)
BUN: 13 mg/dL (ref 6–24)
CHLORIDE: 102 mmol/L (ref 97–106)
CO2: 25 mmol/L (ref 18–29)
Calcium: 9 mg/dL (ref 8.7–10.2)
Creatinine, Ser: 1.39 mg/dL — ABNORMAL HIGH (ref 0.76–1.27)
GFR calc non Af Amer: 57 mL/min/{1.73_m2} — ABNORMAL LOW (ref >59)
GFR, EST AFRICAN AMERICAN: 66 mL/min/{1.73_m2} (ref >59)
GLUCOSE: 138 mg/dL — AB (ref 65–99)
POTASSIUM: 4.7 mmol/L (ref 3.5–5.2)
Sodium: 140 mmol/L (ref 136–144)

## 2015-05-25 LAB — HEPATIC FUNCTION PANEL
ALT: 15 IU/L (ref 0–44)
AST: 20 IU/L (ref 0–40)
Albumin: 3.6 g/dL (ref 3.5–5.5)
Alkaline Phosphatase: 36 IU/L — ABNORMAL LOW (ref 39–117)
BILIRUBIN, DIRECT: 0.14 mg/dL (ref 0.00–0.40)
Bilirubin Total: 0.3 mg/dL (ref 0.0–1.2)
TOTAL PROTEIN: 6 g/dL (ref 6.0–8.5)

## 2015-05-25 LAB — CBC WITH DIFFERENTIAL/PLATELET
BASOS ABS: 0 10*3/uL (ref 0.0–0.2)
Basos: 1 %
EOS (ABSOLUTE): 0.3 10*3/uL (ref 0.0–0.4)
Eos: 6 %
Hematocrit: 37.4 % — ABNORMAL LOW (ref 37.5–51.0)
Hemoglobin: 12.1 g/dL — ABNORMAL LOW (ref 12.6–17.7)
IMMATURE GRANS (ABS): 0 10*3/uL (ref 0.0–0.1)
Immature Granulocytes: 0 %
LYMPHS: 27 %
Lymphocytes Absolute: 1.6 10*3/uL (ref 0.7–3.1)
MCH: 28.1 pg (ref 26.6–33.0)
MCHC: 32.4 g/dL (ref 31.5–35.7)
MCV: 87 fL (ref 79–97)
MONOS ABS: 0.5 10*3/uL (ref 0.1–0.9)
Monocytes: 9 %
NEUTROS ABS: 3.4 10*3/uL (ref 1.4–7.0)
Neutrophils: 57 %
PLATELETS: 235 10*3/uL (ref 150–379)
RBC: 4.31 x10E6/uL (ref 4.14–5.80)
RDW: 14.4 % (ref 12.3–15.4)
WBC: 5.9 10*3/uL (ref 3.4–10.8)

## 2015-05-27 DIAGNOSIS — K5900 Constipation, unspecified: Secondary | ICD-10-CM | POA: Diagnosis not present

## 2015-05-27 DIAGNOSIS — E1142 Type 2 diabetes mellitus with diabetic polyneuropathy: Secondary | ICD-10-CM | POA: Diagnosis not present

## 2015-05-27 DIAGNOSIS — M47817 Spondylosis without myelopathy or radiculopathy, lumbosacral region: Secondary | ICD-10-CM | POA: Diagnosis not present

## 2015-05-27 DIAGNOSIS — G894 Chronic pain syndrome: Secondary | ICD-10-CM | POA: Diagnosis not present

## 2015-05-30 ENCOUNTER — Ambulatory Visit (INDEPENDENT_AMBULATORY_CARE_PROVIDER_SITE_OTHER): Payer: Medicare Other | Admitting: Family Medicine

## 2015-05-30 ENCOUNTER — Encounter: Payer: Self-pay | Admitting: Family Medicine

## 2015-05-30 VITALS — BP 133/77 | HR 107 | Temp 97.6°F | Ht 74.0 in | Wt 242.0 lb

## 2015-05-30 DIAGNOSIS — N183 Chronic kidney disease, stage 3 (moderate): Secondary | ICD-10-CM

## 2015-05-30 DIAGNOSIS — Z794 Long term (current) use of insulin: Secondary | ICD-10-CM

## 2015-05-30 DIAGNOSIS — M7989 Other specified soft tissue disorders: Secondary | ICD-10-CM | POA: Diagnosis not present

## 2015-05-30 DIAGNOSIS — C140 Malignant neoplasm of pharynx, unspecified: Secondary | ICD-10-CM | POA: Diagnosis not present

## 2015-05-30 DIAGNOSIS — F419 Anxiety disorder, unspecified: Secondary | ICD-10-CM

## 2015-05-30 DIAGNOSIS — I70209 Unspecified atherosclerosis of native arteries of extremities, unspecified extremity: Secondary | ICD-10-CM

## 2015-05-30 DIAGNOSIS — E0822 Diabetes mellitus due to underlying condition with diabetic chronic kidney disease: Secondary | ICD-10-CM | POA: Diagnosis not present

## 2015-05-30 DIAGNOSIS — E1122 Type 2 diabetes mellitus with diabetic chronic kidney disease: Secondary | ICD-10-CM | POA: Insufficient documentation

## 2015-05-30 MED ORDER — BUSPIRONE HCL 15 MG PO TABS: ORAL_TABLET | ORAL | Status: DC

## 2015-05-30 NOTE — Patient Instructions (Signed)
The patient should continue to take the Lasix 20 mg are one half of the 40 through Monday. After Monday she does take one half of the 40 mg or 20 mg Monday Wednesday and Friday until he sees a provider in 3-4 weeks We will call him with the results of the BMP that we do today to make sure that his electrolytes have been stable with the amount of weight loss he has had We'll also discuss his therapy and we will encourage him to [facility]-up [facility] at [place] provides regarding his anxiety. We will talk with his doctor there and let him know that the patient is experiencing a lot of anxiety and we'll ask him to see him sooner and for him to recommend the best medication to control his anxiety.

## 2015-05-30 NOTE — Progress Notes (Signed)
 Subjective:    Patient ID: Joseph [name], male    DOB: 11/24/1960, 54 y.o.   MRN: [id number]  HPI Patient here today for 1 week follow up on edema and he also complains of some new stressors in his life and he wants something to have on hand for anxiety. The recent lab work was reviewed with the patient today and he was given a copy of this to take with him. The swelling is much improved with taking the Lasix 20 mg daily after the first couple days of 40 mg daily. His weight is down 12 pounds. He is pleased with the result. He does have a mild anemia and he was given an FOBT card to return and emphasize the importance of returning this. He has not seen any blood in the stool or had any black tarry bowel movements. He does take Aleve occasionally and I told him he should be much more careful with this. He otherwise has no complaints of chest pain or shortness of breath. He does complain with the anxiety and we will get him to follow-up with the mental health people regarding this since her taking care of him anyway.      Patient Active Problem List   Diagnosis Date Noted  . Sinobronchitis 09/16/2014  . Unspecified vitamin D deficiency 09/20/2013  . THROAT CANCER 09/20/2013  . Anxiety   . TMJ click 06/12/2013  . Urinary retention with incomplete bladder emptying 10/31/2011  . Encephalopathy acute 10/30/2011  . ARF (acute renal failure) ([facility]) 10/30/2011  . Diabetes mellitus ([facility]) 10/30/2011  . Abnormal EKG 03/24/2011  . Rheumatoid arthritis(714.0) 04/08/2010  . HYPOTHYROIDISM 01/26/2010  . HYPERLIPIDEMIA 01/26/2010  . SLEEP APNEA 01/26/2010  . HEADACHE, CHRONIC 01/26/2010  . DYSPNEA 01/26/2010   Outpatient Encounter Prescriptions as of 05/30/2015  Medication Sig  . B-D INS SYRINGE 0.5CC/31GX5/16 31G X 5/16" 0.5 ML MISC USE TWICE DAILY TO ADMINISTER INSULIN  . budesonide-formoterol (SYMBICORT) 160-4.5 MCG/ACT inhaler INHALE 2 PUFFS INTO THE LUNGS 2 (TWO) TIMES DAILY.  . CRESTOR 10  MG tablet TAKE 1 TABLET (10 MG TOTAL) BY MOUTH DAILY.  . fenofibrate 54 MG tablet TAKE 1 TABLET BY MOUTH EVERY DAY  . furosemide (LASIX) 40 MG tablet Take 1 tablet (40 mg total) by mouth daily. As directed  . gabapentin (NEURONTIN) 300 MG capsule Take 3 capsules (900 mg total) by mouth 2 (two) times daily.  . glucose blood (ACCU-CHEK AVIVA) test strip Test blood sugar before meals and at bedtime  . HUMALOG MIX 75/25 (75-25) 100 UNIT/ML SUSP injection INJECT 33 UNITS SUBCUTANEOUSLY IN THE MORNING AND 38 UNITS IN IN THE EVENING  . levothyroxine (SYNTHROID, LEVOTHROID) 150 MCG tablet TAKE 1 TABLET (150 MCG TOTAL) BY MOUTH DAILY.  . Linaclotide (LINZESS) 290 MCG CAPS capsule Take 1 capsule (290 mcg total) by mouth daily.  . montelukast (SINGULAIR) 10 MG tablet TAKE 1 TABLET (10 MG TOTAL) BY MOUTH AT BEDTIME.  . morphine (KADIAN) 60 MG 24 hr capsule Take 60 mg by mouth daily.    . oxyCODONE-acetaminophen (PERCOCET) 7.5-325 MG per tablet Take 1 tablet by mouth every 6 (six) hours as needed.    . pantoprazole (PROTONIX) 20 MG tablet TAKE 1 TABLET (20 MG TOTAL) BY MOUTH DAILY.  . QUEtiapine (SEROQUEL XR) 150 MG 24 hr tablet Take 1 tablet (150 mg total) by mouth at bedtime.  . rizatriptan (MAXALT) 10 MG tablet TAKE 1 TABLET (10 MG TOTAL) BY MOUTH AS NEEDED. MAY REPEAT IN   2 HOURS IF NEEDED  . traZODone (DESYREL) 100 MG tablet    No facility-administered encounter medications on file as of 05/30/2015.      Review of Systems  Constitutional: Positive for unexpected weight change (down 12 lb from fluid ).  HENT: Negative.   Eyes: Negative.   Respiratory: Negative.   Cardiovascular: Negative.   Gastrointestinal: Negative.   Endocrine: Negative.   Genitourinary: Negative.   Musculoskeletal: Negative.   Skin: Negative.   Allergic/Immunologic: Negative.   Neurological: Negative.   Hematological: Negative.   Psychiatric/Behavioral: Negative.        Stress and anxiety        Objective:    Physical Exam  Constitutional: He is oriented to person, place, and time. He appears well-developed and well-nourished. No distress.  HENT:  Head: Normocephalic.  Eyes: Conjunctivae and EOM are normal. Pupils are equal, round, and reactive to light. Right eye exhibits no discharge. Left eye exhibits no discharge. No scleral icterus.  Cardiovascular: Normal rate, regular rhythm and normal heart sounds.   No murmur heard. At 84/m  Pulmonary/Chest: Effort normal and breath sounds normal. He has no wheezes. He has no rales.  Musculoskeletal: He exhibits edema. He exhibits no tenderness.  The patient uses a cane for ambulation. There is only minimal if any swelling in the left foot and ankle today. None on the right.  Neurological: He is alert and oriented to person, place, and time.  Skin: Skin is warm and dry. No rash noted.  Psychiatric: He has a normal mood and affect. His behavior is normal. Thought content normal.  Nursing note and vitals reviewed.   BP 133/77 mmHg  Pulse 107  Temp(Src) 97.6 F (36.4 C) (Oral)  Ht 6' 2" (1.88 m)  Wt 242 lb (109.77 kg)  BMI 31.06 kg/m2       Assessment & Plan:  1. Anxiety -The patient is followed by [facility]. He sees Dr. Leigh every 4 months and last saw him one month ago. He is taking a lot of different medicines. When I offered other suggestions today he said he had already tried that. We will defer any further treatment for his anxiety to Dr. Leigh.  2. THROAT CANCER -Continue follow-up with specialist and oncology  3. Diabetes mellitus due to underlying condition with stage 3 chronic kidney disease, with long-term current use of insulin ([facility]) -Continue to monitor blood sugars and watch diet and stay as active as possible  4. Swelling of lower extremity -The edema is the reason the patient was seen initially at the last visit. This is much improved with taking Lasix 40 mg for a couple of days and  then 20 mg daily. His weight is down 12 pounds. He knows to continue to watch his sodium intake. I have requested that he get his weight done daily at the same time every day and if the weight increases again he could increase the Lasix back up to a daily regimen. Currently he is still taking 20 mg and will do this through the weekend and after that he will cut back to 20 mg on Monday Wednesday and Friday and continue to monitor his weight. He will also continue to watch his sodium intake and avoid NSAIDs as much as possible - BMP8+EGFR  Patient Instructions  The patient should continue to take the Lasix 20 mg are one half of the 40 through Monday. After Monday she does take one half of the   40 mg or 20 mg Monday Wednesday and Friday until he sees a provider in 3-4 weeks We will call him with the results of the BMP that we do today to make sure that his electrolytes have been stable with the amount of weight loss he has had We'll also discuss his therapy and we will encourage him to [facility]. We will talk with his doctor there and let him know that the patient is experiencing a lot of anxiety and we'll ask him to see him sooner and for him to recommend the best medication to control his anxiety.   Don W.  MD   

## 2015-05-31 LAB — BMP8+EGFR
BUN/Creatinine Ratio: 13 (ref 9–20)
BUN: 20 mg/dL (ref 6–24)
CALCIUM: 9.8 mg/dL (ref 8.7–10.2)
CHLORIDE: 97 mmol/L (ref 97–106)
CO2: 24 mmol/L (ref 18–29)
Creatinine, Ser: 1.51 mg/dL — ABNORMAL HIGH (ref 0.76–1.27)
GFR calc Af Amer: 60 mL/min/{1.73_m2} (ref >59)
GFR calc non Af Amer: 52 mL/min/{1.73_m2} — ABNORMAL LOW (ref >59)
GLUCOSE: 135 mg/dL — AB (ref 65–99)
POTASSIUM: 4.7 mmol/L (ref 3.5–5.2)
Sodium: 136 mmol/L (ref 136–144)

## 2015-06-08 ENCOUNTER — Other Ambulatory Visit: Payer: Self-pay | Admitting: Family Medicine

## 2015-06-10 ENCOUNTER — Telehealth: Payer: Self-pay | Admitting: Family Medicine

## 2015-06-10 NOTE — Telephone Encounter (Signed)
Patient aware of results and verbalizes understanding.  

## 2015-06-11 ENCOUNTER — Other Ambulatory Visit: Payer: Self-pay | Admitting: Nurse Practitioner

## 2015-06-11 NOTE — Telephone Encounter (Signed)
Last seen 11/11/6 [facility]   Last lipid 03/28/14

## 2015-06-24 DIAGNOSIS — E1142 Type 2 diabetes mellitus with diabetic polyneuropathy: Secondary | ICD-10-CM | POA: Diagnosis not present

## 2015-06-24 DIAGNOSIS — G894 Chronic pain syndrome: Secondary | ICD-10-CM | POA: Diagnosis not present

## 2015-06-24 DIAGNOSIS — K5900 Constipation, unspecified: Secondary | ICD-10-CM | POA: Diagnosis not present

## 2015-06-24 DIAGNOSIS — M47817 Spondylosis without myelopathy or radiculopathy, lumbosacral region: Secondary | ICD-10-CM | POA: Diagnosis not present

## 2015-06-26 ENCOUNTER — Encounter: Payer: Self-pay | Admitting: Family Medicine

## 2015-06-26 ENCOUNTER — Ambulatory Visit (INDEPENDENT_AMBULATORY_CARE_PROVIDER_SITE_OTHER): Payer: Medicare Other | Admitting: Family Medicine

## 2015-06-26 VITALS — BP 140/80 | HR 89 | Temp 98.0°F | Ht 74.0 in | Wt 243.8 lb

## 2015-06-26 DIAGNOSIS — N183 Chronic kidney disease, stage 3 (moderate): Secondary | ICD-10-CM | POA: Diagnosis not present

## 2015-06-26 DIAGNOSIS — E0822 Diabetes mellitus due to underlying condition with diabetic chronic kidney disease: Secondary | ICD-10-CM

## 2015-06-26 DIAGNOSIS — E1169 Type 2 diabetes mellitus with other specified complication: Secondary | ICD-10-CM | POA: Insufficient documentation

## 2015-06-26 DIAGNOSIS — M25521 Pain in right elbow: Secondary | ICD-10-CM

## 2015-06-26 DIAGNOSIS — I70209 Unspecified atherosclerosis of native arteries of extremities, unspecified extremity: Secondary | ICD-10-CM | POA: Diagnosis not present

## 2015-06-26 DIAGNOSIS — E785 Hyperlipidemia, unspecified: Secondary | ICD-10-CM

## 2015-06-26 DIAGNOSIS — Z794 Long term (current) use of insulin: Secondary | ICD-10-CM

## 2015-06-26 LAB — POCT GLYCOSYLATED HEMOGLOBIN (HGB A1C): Hemoglobin A1C: 6

## 2015-06-26 NOTE — Patient Instructions (Signed)
Joseph [name] meet you!  We will try to help you find another psychiatrist  We will call with lab results within 1 week.   I will send the x ray results to your pain doctor.,   Come back in 3 months  Diet Recommendations for Diabetes   Starchy (carb) foods include: Bread, rice, pasta, potatoes, corn, crackers, bagels, muffins, all baked goods.   Protein foods include: Meat, fish, poultry, eggs, dairy foods, and beans such as pinto and kidney beans (beans also provide carbohydrate).   1. Eat at least 3 meals and 1-2 snacks per day. Never go more than 4-5 hours while awake without eating.  2. Limit starchy foods to TWO per meal and ONE per snack. ONE portion of a starchy  food is equal to the following:   - ONE slice of bread (or its equivalent, such as half of a hamburger bun).   - 1/2 cup of a "scoopable" starchy food such as potatoes or rice.   - 1 OUNCE (28 grams) of starchy snack foods such as crackers or pretzels (look on label).   - 15 grams of carbohydrate as shown on food label.  3. Both lunch and dinner should include a protein food, a carb food, and vegetables.   - Obtain twice as many veg's as protein or carbohydrate foods for both lunch and dinner.   - Try to keep frozen veg's on hand for a quick vegetable serving.     - Fresh or frozen veg's are best.  4. Breakfast should always include protein.

## 2015-06-26 NOTE — Progress Notes (Signed)
   HPI  Patient presents today to discuss anxiety, elbow pain, and diabetes.  Anxiety Patient is established patient with a marked psychiat[facility], Dr. Marliss Czar is his psychiatrist. He is currently taking trazodone, Seroquel, sertraline, and BuSpar. He comes in requesting titration off of his medication and the addition of Xanax. He denies suicidal ideation He started decreasing his trazodone and Seroquel and states he's had a hard time sleeping since that time. Also tried stopping sertraline but had increased anxiety so he started that back.  Elbow pain He has elbow pain and other chronic pain managed by chronic pain doctor in [place], he is on morphine and oxycodone daily further prescriptions.  Diabetes Watches his diet intermittently Fasting blood sugars 80-120. 1-2 episodes of hypoglycemia every 2 months. His last visit about 2 months ago where he had a low blood sugar of around 58 and recovered with glucose tablets. He had feelings of sweating, fatigue, and racing heart during this episode. He takesHumalog 75/25, 33 units in the morning and 38 units at night.  He has a fecal occult blood test at home but has not turned it in. He does have neuropathy that it's unclear if it's diabetes related or trauma related as it started after he broke to legs.   PMH: Smoking status noted ROS: Per HPI  Objective: BP 140/80 mmHg  Pulse 89  Temp(Src) 98 F (36.7 C) (Oral)  Ht 6\' 2"  (1.88 m)  Wt 243 lb 12.8 oz (110.587 kg)  BMI 31.29 kg/m2 Gen: NAD, alert, cooperative with exam HEENT: NCAT, altered voice consistent with tracheostomy Neck: Bandage over his trachea consistent with tracheostomy CV: RRR, good S1/S2, no murmur Resp: CTABL, no wheezes, non-labored Ext: No edema, warm Neuro: Alert and oriented, No gross deficits  Foot exam: 1+ dorsalis pedis pulses, no lesions, brisk cap refill Sensation intact to monofilament throughout  Assessment and plan:  # Type 2 diabetes A1c  pending He is on a steady insulin regimen with rare hypoglycemia, continue currently Discussed more aggressive diet changes which he agrees to. He cannot produce urine today for a urine microalbumin test A1c today  # Anxiety I explained that should not be changing his anxiety medications due to complicated and aggressive treatment by a psychiatrist He would like a second opinion, psychiatry referral written  # Chronic pain Managed by chronic pain Elbow pain He has a prescription present with him from his pain management specialist requesting right elbow x-ray, I will send the results to them   # Hyperlipidemia Lipid panel Continue Crestor Discussed diet  Orders Placed This Encounter  Procedures  . DG Elbow 2 Views Right    Standing Status: Future     Number of Occurrences:      Standing Expiration Date: 08/25/2016    Order Specific Question:  Reason for Exam (SYMPTOM  OR DIAGNOSIS REQUIRED)    Answer:  right elbow pain    Order Specific Question:  Preferred imaging location?    Answer:  Internal  . Lipid Panel  . POCT glycosylated hemoglobin (Hb A1C)     Laroy Apple, MD [facility] 06/26/2015, 2:43 PM

## 2015-06-27 LAB — LIPID PANEL
CHOLESTEROL TOTAL: 123 mg/dL (ref 100–199)
Chol/HDL Ratio: 2.6 ratio units (ref 0.0–5.0)
HDL: 48 mg/dL (ref >39)
LDL Calculated: 40 mg/dL (ref 0–99)
Triglycerides: 175 mg/dL — ABNORMAL HIGH (ref 0–149)
VLDL CHOLESTEROL CAL: 35 mg/dL (ref 5–40)

## 2015-07-02 ENCOUNTER — Other Ambulatory Visit: Payer: Self-pay | Admitting: Family

## 2015-07-02 NOTE — Telephone Encounter (Signed)
Last seen 06/26/15  Dr Wendi Snipes  Last thyroid level 09/20/13

## 2015-07-18 ENCOUNTER — Other Ambulatory Visit: Payer: Self-pay | Admitting: *Deleted

## 2015-07-18 MED ORDER — MONTELUKAST SODIUM 10 MG PO TABS: ORAL_TABLET | ORAL | Status: DC

## 2015-07-23 DIAGNOSIS — K5900 Constipation, unspecified: Secondary | ICD-10-CM | POA: Diagnosis not present

## 2015-07-23 DIAGNOSIS — M47817 Spondylosis without myelopathy or radiculopathy, lumbosacral region: Secondary | ICD-10-CM | POA: Diagnosis not present

## 2015-07-23 DIAGNOSIS — Z79891 Long term (current) use of opiate analgesic: Secondary | ICD-10-CM | POA: Diagnosis not present

## 2015-07-23 DIAGNOSIS — G894 Chronic pain syndrome: Secondary | ICD-10-CM | POA: Diagnosis not present

## 2015-07-23 DIAGNOSIS — E1142 Type 2 diabetes mellitus with diabetic polyneuropathy: Secondary | ICD-10-CM | POA: Diagnosis not present

## 2015-08-15 DIAGNOSIS — F329 Major depressive disorder, single episode, unspecified: Secondary | ICD-10-CM | POA: Diagnosis not present

## 2015-08-20 DIAGNOSIS — K5900 Constipation, unspecified: Secondary | ICD-10-CM | POA: Diagnosis not present

## 2015-08-20 DIAGNOSIS — E1142 Type 2 diabetes mellitus with diabetic polyneuropathy: Secondary | ICD-10-CM | POA: Diagnosis not present

## 2015-08-20 DIAGNOSIS — M47817 Spondylosis without myelopathy or radiculopathy, lumbosacral region: Secondary | ICD-10-CM | POA: Diagnosis not present

## 2015-08-20 DIAGNOSIS — G894 Chronic pain syndrome: Secondary | ICD-10-CM | POA: Diagnosis not present

## 2015-08-21 DIAGNOSIS — G5622 Lesion of ulnar nerve, left upper limb: Secondary | ICD-10-CM | POA: Diagnosis not present

## 2015-08-21 DIAGNOSIS — E1140 Type 2 diabetes mellitus with diabetic neuropathy, unspecified: Secondary | ICD-10-CM | POA: Diagnosis not present

## 2015-08-21 DIAGNOSIS — G5601 Carpal tunnel syndrome, right upper limb: Secondary | ICD-10-CM | POA: Diagnosis not present

## 2015-09-12 ENCOUNTER — Other Ambulatory Visit: Payer: Self-pay | Admitting: Family Medicine

## 2015-09-19 DIAGNOSIS — M47817 Spondylosis without myelopathy or radiculopathy, lumbosacral region: Secondary | ICD-10-CM | POA: Diagnosis not present

## 2015-09-19 DIAGNOSIS — K5900 Constipation, unspecified: Secondary | ICD-10-CM | POA: Diagnosis not present

## 2015-09-19 DIAGNOSIS — E1142 Type 2 diabetes mellitus with diabetic polyneuropathy: Secondary | ICD-10-CM | POA: Diagnosis not present

## 2015-09-19 DIAGNOSIS — G894 Chronic pain syndrome: Secondary | ICD-10-CM | POA: Diagnosis not present

## 2015-10-17 DIAGNOSIS — G894 Chronic pain syndrome: Secondary | ICD-10-CM | POA: Diagnosis not present

## 2015-10-17 DIAGNOSIS — M47817 Spondylosis without myelopathy or radiculopathy, lumbosacral region: Secondary | ICD-10-CM | POA: Diagnosis not present

## 2015-10-17 DIAGNOSIS — K5900 Constipation, unspecified: Secondary | ICD-10-CM | POA: Diagnosis not present

## 2015-10-17 DIAGNOSIS — E1142 Type 2 diabetes mellitus with diabetic polyneuropathy: Secondary | ICD-10-CM | POA: Diagnosis not present

## 2015-10-24 ENCOUNTER — Encounter: Payer: Self-pay | Admitting: Family Medicine

## 2015-10-24 ENCOUNTER — Ambulatory Visit (INDEPENDENT_AMBULATORY_CARE_PROVIDER_SITE_OTHER): Payer: Medicare Other | Admitting: Family Medicine

## 2015-10-24 VITALS — BP 109/67 | HR 83 | Temp 98.0°F | Ht 74.0 in | Wt 240.2 lb

## 2015-10-24 DIAGNOSIS — J189 Pneumonia, unspecified organism: Secondary | ICD-10-CM | POA: Diagnosis not present

## 2015-10-24 DIAGNOSIS — Z794 Long term (current) use of insulin: Secondary | ICD-10-CM | POA: Diagnosis not present

## 2015-10-24 DIAGNOSIS — E119 Type 2 diabetes mellitus without complications: Secondary | ICD-10-CM | POA: Diagnosis not present

## 2015-10-24 LAB — BAYER DCA HB A1C WAIVED: HB A1C (BAYER DCA - WAIVED): 6.4 % (ref <7.0)

## 2015-10-24 MED ORDER — AMOXICILLIN-POT CLAVULANATE 875-125 MG PO TABS: 1.0000 | ORAL_TABLET | Freq: Two times a day (BID) | ORAL | Status: DC

## 2015-10-24 NOTE — Patient Instructions (Signed)
Great to see you!  We will call with labs within 1 week You will probably need to be on some insulin, although I think your amount will need to be reduced.   Take all of the augmentin for pneumonia  Community-Acquired Pneumonia, Adult Pneumonia is an infection of the lungs. There are different types of pneumonia. One type can develop while a person is in a hospital. A different type, called community-acquired pneumonia, develops in people who are not, or have not recently been, in the hospital or other health care facility.  CAUSES Pneumonia may be caused by bacteria, viruses, or funguses. Community-acquired pneumonia is often caused by Streptococcus pneumonia bacteria. These bacteria are often passed from one person to another by breathing in droplets from the cough or sneeze of an infected person. RISK FACTORS The condition is more likely to develop in:  People who havechronic diseases, such as chronic obstructive pulmonary disease (COPD), asthma, congestive heart failure, cystic fibrosis, diabetes, or kidney disease.  People who haveearly-stage or late-stage HIV.  People who havesickle cell disease.  People who havehad their spleen removed (splenectomy).  People who havepoor Human resources officer.  People who havemedical conditions that increase the risk of breathing in (aspirating) secretions their own mouth and nose.   People who havea weakened immune system (immunocompromised).  People who smoke.  People whotravel to areas where pneumonia-causing germs commonly exist.  People whoare around animal habitats or animals that have pneumonia-causing germs, including birds, bats, rabbits, cats, and farm animals. SYMPTOMS Symptoms of this condition include:  Adry cough.  A wet (productive) cough.  Fever.  Sweating.  Chest pain, especially when breathing deeply or coughing.  Rapid breathing or difficulty breathing.  Shortness of breath.  Shaking  chills.  Fatigue.  Muscle aches. DIAGNOSIS Your health care provider will take a medical history and perform a physical exam. You may also have other tests, including:  Imaging studies of your chest, including X-rays.  Tests to check your blood oxygen level and other blood gases.  Other tests on blood, mucus (sputum), fluid around your lungs (pleural fluid), and urine. If your pneumonia is severe, other tests may be done to identify the specific cause of your illness. TREATMENT The type of treatment that you receive depends on many factors, such as the cause of your pneumonia, the medicines you take, and other medical conditions that you have. For most adults, treatment and recovery from pneumonia may occur at home. In some cases, treatment must happen in a hospital. Treatment may include:  Antibiotic medicines, if the pneumonia was caused by bacteria.  Antiviral medicines, if the pneumonia was caused by a virus.  Medicines that are given by mouth or through an IV tube.  Oxygen.  Respiratory therapy. Although rare, treating severe pneumonia may include:  Mechanical ventilation. This is done if you are not breathing well on your own and you cannot maintain a safe blood oxygen level.  Thoracentesis. This procedureremoves fluid around one lung or both lungs to help you breathe better. HOME CARE INSTRUCTIONS  Take over-the-counter and prescription medicines only as told by your health care provider.  Only takecough medicine if you are losing sleep. Understand that cough medicine can prevent your body's natural ability to remove mucus from your lungs.  If you were prescribed an antibiotic medicine, take it as told by your health care provider. Do not stop taking the antibiotic even if you start to feel better.  Sleep in a semi-upright position at night. Try sleeping  in a reclining chair, or place a few pillows under your head.  Do not use tobacco products, including cigarettes,  chewing tobacco, and e-cigarettes. If you need help quitting, ask your health care provider.  Drink enough water to keep your urine clear or pale yellow. This will help to thin out mucus secretions in your lungs. PREVENTION There are ways that you can decrease your risk of developing community-acquired pneumonia. Consider getting a pneumococcal vaccine if:  You are older than 55 years of age.  You are older than 55 years of age and are undergoing cancer treatment, have chronic lung disease, or have other medical conditions that affect your immune system. Ask your health care provider if this applies to you. There are different types and schedules of pneumococcal vaccines. Ask your health care provider which vaccination option is best for you. You may also prevent community-acquired pneumonia if you take these actions:  Get an influenza vaccine every year. Ask your health care provider which type of influenza vaccine is best for you.  Go to the dentist on a regular basis.  Wash your hands often. Use hand sanitizer if soap and water are not available. SEEK MEDICAL CARE IF:  You have a fever.  You are losing sleep because you cannot control your cough with cough medicine. SEEK IMMEDIATE MEDICAL CARE IF:  You have worsening shortness of breath.  You have increased chest pain.  Your sickness becomes worse, especially if you are an older adult or have a weakened immune system.  You cough up blood.   This information is not intended to replace advice given to you by your health care provider. Make sure you discuss any questions you have with your health care provider.   Document Released: 07/05/2005 Document Revised: 03/26/2015 Document Reviewed: 10/30/2014 Elsevier Interactive Patient Education [employer].

## 2015-10-24 NOTE — Progress Notes (Signed)
   HPI  Patient presents today for acute illness and to discuss diabetes.  Patient explains that for the last 1 week he's had shortness of breath, cough, chest tightness, malaise. Is tolerating food and fluids normally. He denies ear pain or sinus pressure.  Diabetes Not taking insulin at all, he states that he had low blood sugars 724 when he stopped He states that his fasting blood sugar ranges 9230, he also states that he does not see the readings higher than 150 postprandial.   PMH: Smoking status noted ROS: Per HPI  Objective: BP 109/67 mmHg  Pulse 83  Temp(Src) 98 F (36.7 C) (Oral)  Ht '6\' 2"'$  (1.88 m)  Wt 240 lb 3.2 oz (108.954 kg)  BMI 30.83 kg/m2 Gen: NAD, alert, cooperative with exam HEENT: NCAT Neck: Tracheostomy covered with foam pad CV: RRR, good S1/S2, no murmur Resp: Coarse expiratory sounds and rhonchi in the right upper lung field Ext: No edema, warm Neuro: Alert and oriented, No gross deficits  Assessment and plan:  # Community acquired pneumonia Treat with Augmentin Supportive care discussed Discussed that he would likely benefit from restarting insulin although at reduced dose, see below  # 2 diabetes Previously on 33 units of 75/25 and 38 units at night A1c today, labs Encouraged him to restart at a lower reduced dose, consider 10 units twice daily, however is CBGs arch refill his A1c will be controlled despite his stopping insulin.      Orders Placed This Encounter  Procedures  . Bayer DCA Hb A1c Waived  . CMP14+EGFR  . CBC with Differential/Platelet    Meds ordered this encounter  Medications  . hydrOXYzine (ATARAX/VISTARIL) 50 MG tablet    Sig: Take 50 mg by mouth 4 (four) times daily.    Refill:  2  . amoxicillin-clavulanate (AUGMENTIN) 875-125 MG tablet    Sig: Take 1 tablet by mouth 2 (two) times daily.    Dispense:  20 tablet    Refill:  0    Laroy Apple, MD [facility] 10/24/2015, 3:36  PM

## 2015-10-27 LAB — CBC WITH DIFFERENTIAL/PLATELET

## 2015-10-27 LAB — CMP14+EGFR
ALBUMIN: 4.7 g/dL (ref 3.5–5.5)
ALT: 22 IU/L (ref 0–44)
AST: 27 IU/L (ref 0–40)
Albumin/Globulin Ratio: 1.7 (ref 1.2–2.2)
Alkaline Phosphatase: 34 IU/L — ABNORMAL LOW (ref 39–117)
BUN / CREAT RATIO: 14 (ref 9–20)
BUN: 22 mg/dL (ref 6–24)
Bilirubin Total: 0.4 mg/dL (ref 0.0–1.2)
CALCIUM: 10.1 mg/dL (ref 8.7–10.2)
CO2: 25 mmol/L (ref 18–29)
CREATININE: 1.57 mg/dL — AB (ref 0.76–1.27)
Chloride: 98 mmol/L (ref 96–106)
GFR calc Af Amer: 57 mL/min/{1.73_m2} — ABNORMAL LOW (ref >59)
GFR, EST NON AFRICAN AMERICAN: 49 mL/min/{1.73_m2} — AB (ref >59)
GLOBULIN, TOTAL: 2.8 g/dL (ref 1.5–4.5)
Glucose: 90 mg/dL (ref 65–99)
POTASSIUM: 4.9 mmol/L (ref 3.5–5.2)
SODIUM: 140 mmol/L (ref 134–144)
Total Protein: 7.5 g/dL (ref 6.0–8.5)

## 2015-11-05 ENCOUNTER — Other Ambulatory Visit: Payer: Self-pay | Admitting: *Deleted

## 2015-11-05 DIAGNOSIS — Z794 Long term (current) use of insulin: Principal | ICD-10-CM

## 2015-11-05 DIAGNOSIS — E119 Type 2 diabetes mellitus without complications: Secondary | ICD-10-CM

## 2015-11-13 DIAGNOSIS — E1142 Type 2 diabetes mellitus with diabetic polyneuropathy: Secondary | ICD-10-CM | POA: Diagnosis not present

## 2015-11-13 DIAGNOSIS — G894 Chronic pain syndrome: Secondary | ICD-10-CM | POA: Diagnosis not present

## 2015-11-13 DIAGNOSIS — M47817 Spondylosis without myelopathy or radiculopathy, lumbosacral region: Secondary | ICD-10-CM | POA: Diagnosis not present

## 2015-11-13 DIAGNOSIS — K5900 Constipation, unspecified: Secondary | ICD-10-CM | POA: Diagnosis not present

## 2015-12-04 ENCOUNTER — Other Ambulatory Visit: Payer: Self-pay | Admitting: Family Medicine

## 2015-12-04 DIAGNOSIS — F329 Major depressive disorder, single episode, unspecified: Secondary | ICD-10-CM | POA: Diagnosis not present

## 2015-12-12 DIAGNOSIS — E1142 Type 2 diabetes mellitus with diabetic polyneuropathy: Secondary | ICD-10-CM | POA: Diagnosis not present

## 2015-12-12 DIAGNOSIS — K5900 Constipation, unspecified: Secondary | ICD-10-CM | POA: Diagnosis not present

## 2015-12-12 DIAGNOSIS — Z79891 Long term (current) use of opiate analgesic: Secondary | ICD-10-CM | POA: Diagnosis not present

## 2015-12-12 DIAGNOSIS — M47817 Spondylosis without myelopathy or radiculopathy, lumbosacral region: Secondary | ICD-10-CM | POA: Diagnosis not present

## 2015-12-12 DIAGNOSIS — G894 Chronic pain syndrome: Secondary | ICD-10-CM | POA: Diagnosis not present

## 2015-12-16 ENCOUNTER — Other Ambulatory Visit: Payer: Self-pay | Admitting: Family Medicine

## 2015-12-29 DIAGNOSIS — N5201 Erectile dysfunction due to arterial insufficiency: Secondary | ICD-10-CM | POA: Diagnosis not present

## 2015-12-29 DIAGNOSIS — N400 Enlarged prostate without lower urinary tract symptoms: Secondary | ICD-10-CM | POA: Diagnosis not present

## 2016-01-13 ENCOUNTER — Other Ambulatory Visit: Payer: Self-pay | Admitting: Family Medicine

## 2016-02-02 DIAGNOSIS — E1142 Type 2 diabetes mellitus with diabetic polyneuropathy: Secondary | ICD-10-CM | POA: Diagnosis not present

## 2016-02-02 DIAGNOSIS — G894 Chronic pain syndrome: Secondary | ICD-10-CM | POA: Diagnosis not present

## 2016-02-02 DIAGNOSIS — K5900 Constipation, unspecified: Secondary | ICD-10-CM | POA: Diagnosis not present

## 2016-02-02 DIAGNOSIS — M47817 Spondylosis without myelopathy or radiculopathy, lumbosacral region: Secondary | ICD-10-CM | POA: Diagnosis not present

## 2016-02-23 ENCOUNTER — Encounter: Payer: Self-pay | Admitting: Family Medicine

## 2016-02-24 ENCOUNTER — Other Ambulatory Visit: Payer: Self-pay | Admitting: Family Medicine

## 2016-03-06 LAB — HM DIABETES EYE EXAM

## 2016-03-10 DIAGNOSIS — G894 Chronic pain syndrome: Secondary | ICD-10-CM | POA: Diagnosis not present

## 2016-03-10 DIAGNOSIS — K5900 Constipation, unspecified: Secondary | ICD-10-CM | POA: Diagnosis not present

## 2016-03-10 DIAGNOSIS — E1142 Type 2 diabetes mellitus with diabetic polyneuropathy: Secondary | ICD-10-CM | POA: Diagnosis not present

## 2016-03-10 DIAGNOSIS — M47817 Spondylosis without myelopathy or radiculopathy, lumbosacral region: Secondary | ICD-10-CM | POA: Diagnosis not present

## 2016-04-05 DIAGNOSIS — F329 Major depressive disorder, single episode, unspecified: Secondary | ICD-10-CM | POA: Diagnosis not present

## 2016-04-06 DIAGNOSIS — E1142 Type 2 diabetes mellitus with diabetic polyneuropathy: Secondary | ICD-10-CM | POA: Diagnosis not present

## 2016-04-06 DIAGNOSIS — K5900 Constipation, unspecified: Secondary | ICD-10-CM | POA: Diagnosis not present

## 2016-04-06 DIAGNOSIS — M47817 Spondylosis without myelopathy or radiculopathy, lumbosacral region: Secondary | ICD-10-CM | POA: Diagnosis not present

## 2016-04-06 DIAGNOSIS — G894 Chronic pain syndrome: Secondary | ICD-10-CM | POA: Diagnosis not present

## 2016-04-14 ENCOUNTER — Encounter: Payer: Self-pay | Admitting: Family Medicine

## 2016-04-14 ENCOUNTER — Ambulatory Visit (INDEPENDENT_AMBULATORY_CARE_PROVIDER_SITE_OTHER): Payer: Medicare Other | Admitting: Family Medicine

## 2016-04-14 VITALS — BP 94/56 | HR 58 | Temp 97.7°F | Ht 74.0 in | Wt 245.0 lb

## 2016-04-14 DIAGNOSIS — Z23 Encounter for immunization: Secondary | ICD-10-CM | POA: Diagnosis not present

## 2016-04-14 DIAGNOSIS — K219 Gastro-esophageal reflux disease without esophagitis: Secondary | ICD-10-CM | POA: Diagnosis not present

## 2016-04-14 DIAGNOSIS — N183 Chronic kidney disease, stage 3 (moderate): Secondary | ICD-10-CM

## 2016-04-14 DIAGNOSIS — Z114 Encounter for screening for human immunodeficiency virus [HIV]: Secondary | ICD-10-CM

## 2016-04-14 DIAGNOSIS — J449 Chronic obstructive pulmonary disease, unspecified: Secondary | ICD-10-CM | POA: Diagnosis not present

## 2016-04-14 DIAGNOSIS — Z1159 Encounter for screening for other viral diseases: Secondary | ICD-10-CM

## 2016-04-14 DIAGNOSIS — E559 Vitamin D deficiency, unspecified: Secondary | ICD-10-CM

## 2016-04-14 DIAGNOSIS — E785 Hyperlipidemia, unspecified: Secondary | ICD-10-CM

## 2016-04-14 DIAGNOSIS — Z794 Long term (current) use of insulin: Secondary | ICD-10-CM | POA: Diagnosis not present

## 2016-04-14 DIAGNOSIS — G43809 Other migraine, not intractable, without status migrainosus: Secondary | ICD-10-CM | POA: Diagnosis not present

## 2016-04-14 DIAGNOSIS — E0822 Diabetes mellitus due to underlying condition with diabetic chronic kidney disease: Secondary | ICD-10-CM

## 2016-04-14 DIAGNOSIS — Z7289 Other problems related to lifestyle: Secondary | ICD-10-CM | POA: Diagnosis not present

## 2016-04-14 DIAGNOSIS — E039 Hypothyroidism, unspecified: Secondary | ICD-10-CM

## 2016-04-14 LAB — BAYER DCA HB A1C WAIVED: HB A1C: 6.2 % (ref <7.0)

## 2016-04-14 MED ORDER — RIZATRIPTAN BENZOATE 10 MG PO TABS: ORAL_TABLET | ORAL | 2 refills | Status: DC

## 2016-04-14 MED ORDER — BUDESONIDE-FORMOTEROL FUMARATE 160-4.5 MCG/ACT IN AERO: INHALATION_SPRAY | RESPIRATORY_TRACT | 2 refills | Status: DC

## 2016-04-14 MED ORDER — PANTOPRAZOLE SODIUM 20 MG PO TBEC: DELAYED_RELEASE_TABLET | ORAL | 1 refills | Status: DC

## 2016-04-14 MED ORDER — LEVOTHYROXINE SODIUM 150 MCG PO TABS: ORAL_TABLET | ORAL | 1 refills | Status: DC

## 2016-04-14 NOTE — Progress Notes (Signed)
BP (!) 94/56   Pulse (!) 58   Temp 97.7 F (36.5 C) (Oral)   Ht 6' 2" (1.88 m)   Wt 245 lb (111.1 kg)   BMI 31.46 kg/m    Subjective:    Patient ID: Joseph [name], male    DOB: December 31, 1960, 55 y.o.   MRN: [id number]  HPI: Joseph [name] is a 55 y.o. male presenting on 04/14/2016 for Diabetes (pt here today for routine medical follow up and has c/o fatigue. Pt has stopped all of his diabetes meds, cholesterol meds and thyroid medications because he wasn't able to come into the doctors office.)   HPI Type 2 diabetes Patient comes in for type 2 diabetes. His hemoglobin A1c came back as 6.2 today. He was previously on insulin but has been off of it for at least 3 months. He says he has not been getting any major hyperglycemic episodes at home but has not checked it very frequently. Since he had been off for 3 months and his A1c was good there is some question about whether he still needed the insulin or not. We will reevaluate the rest of his labs and see what other medications he may need to be on at that point. He has not seen an ophthalmologist yet this year. He does have known neuropathy and is on gabapentin for that. He has been out of his gabapentin so his neuropathy has been worsened over the past few months.  Hypothyroid recheck Patient is also coming in today for a refill on his thyroid medication and recheck on his hypothyroidism. He was previously on thyroid medication but has been off of it for 3 months. He has been having some changes in energy and weight gain and changes in his hair. He denies any constipation or palpitations or chest pains.  Hyperlipidemia recheck and vitamin D recheck Patient has had issues with his cholesterol and vitamin D previously. He is not currently on any medications as is been out of all of his medications for 3 months. We will recheck labs today.  COPD recheck Patient was previously on Symbicort and Singulair but has been out of it for 3  months and he has been having a lot of issues with congestion and wheezing about every other day day and at least 1 night per week. He does still have some of his albuterol inhaler left and that helped him through some of this time, As he was using it at least a few times a day. He denies any shortness of breath or wheezing currently today here in the office.  Migraine medication refill and GERD medication refill Patient was on Protonix for GERD and uses Maxalt for migraines. His GERD has not been as good since he's been off of the Protonix and he has been having occasional migraines still and without the Maxalt is not able to combat them so they last up to a day now. He denies any focal numbness or weakness and denies any blood in his stool. He denies any abdominal pain. He does need refills on his medications.  Relevant past medical, surgical, family and social history reviewed and updated as indicated. Interim medical history since our last visit reviewed. Allergies and medications reviewed and updated.  Review of Systems  Constitutional: Negative for chills and fever.  HENT: Negative for congestion, sinus pressure, sneezing and sore throat.   Eyes: Negative for discharge and visual disturbance.  Respiratory: Positive for cough, shortness of breath and wheezing.  Cardiovascular: Negative for chest pain and leg swelling.  Gastrointestinal: Negative for abdominal pain, blood in stool, diarrhea, nausea and vomiting.  Endocrine: Negative for cold intolerance and heat intolerance.  Musculoskeletal: Negative for back pain and gait problem.  Skin: Negative for rash.  Neurological: Positive for numbness and headaches. Negative for dizziness, speech difficulty, weakness and light-headedness.  All other systems reviewed and are negative.   Per HPI unless specifically indicated above     Medication List       Accurate as of 04/14/16  2:38 PM. Always use your most recent med list.            B-D INS SYRINGE 0.5CC/31GX5/16 31G X 5/16" 0.5 ML Misc Generic drug:  Insulin Syringe-Needle U-100 USE TWICE DAILY TO ADMINISTER INSULIN   budesonide-formoterol 160-4.5 MCG/ACT inhaler Commonly known as:  SYMBICORT INHALE 2 PUFFS INTO THE LUNGS 2 (TWO) TIMES DAILY.   doxepin 10 MG capsule Commonly known as:  SINEQUAN   fenofibrate 54 MG tablet TAKE 1 TABLET BY MOUTH EVERY DAY   furosemide 40 MG tablet Commonly known as:  LASIX Take 1 tablet (40 mg total) by mouth daily. As directed   gabapentin 300 MG capsule Commonly known as:  NEURONTIN Take 3 capsules (900 mg total) by mouth 2 (two) times daily.   glucose blood test strip Commonly known as:  ACCU-CHEK AVIVA Test blood sugar before meals and at bedtime   HUMALOG MIX 75/25 (75-25) 100 UNIT/ML Susp injection Generic drug:  insulin lispro protamine-lispro INJECT 33 UNITS SUBCUTANEOUSLY IN THE MORNING AND 38 UNITS IN IN THE EVENING   hydrOXYzine 50 MG tablet Commonly known as:  ATARAX/VISTARIL Take 50 mg by mouth 4 (four) times daily.   levothyroxine 150 MCG tablet Commonly known as:  SYNTHROID, LEVOTHROID TAKE 1 TABLET (150 MCG TOTAL) BY MOUTH DAILY.   linaclotide 290 MCG Caps capsule Commonly known as:  LINZESS Take 1 capsule (290 mcg total) by mouth daily.   morphine 60 MG 24 hr capsule Commonly known as:  KADIAN Take 60 mg by mouth daily.   oxyCODONE-acetaminophen 7.5-325 MG tablet Commonly known as:  PERCOCET Take 1 tablet by mouth every 6 (six) hours as needed.   pantoprazole 20 MG tablet Commonly known as:  PROTONIX TAKE 1 TABLET (20 MG TOTAL) BY MOUTH DAILY.   rizatriptan 10 MG tablet Commonly known as:  MAXALT TAKE 1 TABLET (10 MG TOTAL) BY MOUTH AS NEEDED. MAY REPEAT IN 2 HOURS IF NEEDED   rosuvastatin 10 MG tablet Commonly known as:  CRESTOR TAKE 1 TABLET (10 MG TOTAL) BY MOUTH DAILY. NEEDS TO BE SEEN   SEROQUEL XR 150 MG 24 hr tablet Generic drug:  QUEtiapine Fumarate TAKE 1 TABLET (150 MG  TOTAL) BY MOUTH AT BEDTIME.   traZODone 100 MG tablet Commonly known as:  DESYREL          Objective:    BP (!) 94/56   Pulse (!) 58   Temp 97.7 F (36.5 C) (Oral)   Ht 6' 2" (1.88 m)   Wt 245 lb (111.1 kg)   BMI 31.46 kg/m   Wt Readings from Last 3 Encounters:  04/14/16 245 lb (111.1 kg)  10/24/15 240 lb 3.2 oz (109 kg)  06/26/15 243 lb 12.8 oz (110.6 kg)    Physical Exam  Constitutional: He is oriented to person, place, and time. He appears well-developed and well-nourished. No distress.  Eyes: Conjunctivae are normal. Right eye exhibits no discharge. No scleral icterus.  Neck:  Neck supple. No thyromegaly present.  Cardiovascular: Normal rate, regular rhythm, normal heart sounds and intact distal pulses.   No murmur heard. Pulmonary/Chest: Effort normal and breath sounds normal. No respiratory distress. He has no wheezes. He has no rales. He exhibits no tenderness.  Abdominal: Soft. Bowel sounds are normal. He exhibits no distension. There is no tenderness. There is no rebound.  Musculoskeletal: Normal range of motion. He exhibits no edema.  Lymphadenopathy:    He has no cervical adenopathy.  Neurological: He is alert and oriented to person, place, and time. He displays normal reflexes. No cranial nerve deficit. He exhibits normal muscle tone. Coordination normal.  Skin: Skin is warm and dry. No rash noted. He is not diaphoretic.  Psychiatric: He has a normal mood and affect. His behavior is normal.  Nursing note and vitals reviewed.   Results for orders placed or performed in visit on 03/11/16  HM DIABETES EYE EXAM  Result Value Ref Range   HM Diabetic Eye Exam No Retinopathy No Retinopathy      Assessment & Plan:   Problem List Items Addressed This Visit      Endocrine   Hypothyroidism   Relevant Medications   levothyroxine (SYNTHROID, LEVOTHROID) 150 MCG tablet   Other Relevant Orders   TSH (Completed)   Diabetes mellitus ([place]) - Primary    120-150,       Relevant Orders   Bayer DCA Hb A1c Waived   CMP14+EGFR (Completed)     Other   Vitamin D deficiency   Relevant Orders   VITAMIN D 25 Hydroxy (Vit-D Deficiency, Fractures)   HLD (hyperlipidemia)   Relevant Orders   Lipid panel (Completed)    Other Visit Diagnoses    Screening for HIV (human immunodeficiency virus)       Relevant Orders   HIV antibody (Completed)   Need for hepatitis C screening test       Relevant Orders   Hepatitis C antibody (Completed)   Chronic obstructive pulmonary disease, unspecified COPD type ([place])       Relevant Medications   budesonide-formoterol (SYMBICORT) 160-4.5 MCG/ACT inhaler   Other migraine without status migrainosus, not intractable       Relevant Medications   doxepin (SINEQUAN) 10 MG capsule   rizatriptan (MAXALT) 10 MG tablet   Gastroesophageal reflux disease, esophagitis presence not specified       Relevant Medications   pantoprazole (PROTONIX) 20 MG tablet   Encounter for immunization       Relevant Orders   Flu Vaccine QUAD 36+ mos IM (Completed)       Follow up plan: Return in about 3 months (around 07/14/2016), or if symptoms worsen or fail to improve, for Diabetes and cholesterol recheck.  Counseling provided for all of the vaccine components Orders Placed This Encounter  Procedures  . Bayer DCA Hb A1c Waived  . CMP14+EGFR  . Lipid panel  . HIV antibody  . TSH  . Hepatitis C antibody  . VITAMIN D 25 Hydroxy (Vit-D Deficiency, Fractures)    Caryl Pina, MD [facility] 04/14/2016, 2:38 PM

## 2016-04-14 NOTE — Assessment & Plan Note (Signed)
120-150, 

## 2016-04-15 LAB — CMP14+EGFR
ALBUMIN: 4.6 g/dL (ref 3.5–5.5)
ALT: 25 IU/L (ref 0–44)
AST: 28 IU/L (ref 0–40)
Albumin/Globulin Ratio: 1.7 (ref 1.2–2.2)
Alkaline Phosphatase: 32 IU/L — ABNORMAL LOW (ref 39–117)
BUN / CREAT RATIO: 11 (ref 9–20)
BUN: 16 mg/dL (ref 6–24)
Bilirubin Total: 0.2 mg/dL (ref 0.0–1.2)
CALCIUM: 9.8 mg/dL (ref 8.7–10.2)
CO2: 24 mmol/L (ref 18–29)
CREATININE: 1.45 mg/dL — AB (ref 0.76–1.27)
Chloride: 100 mmol/L (ref 96–106)
GFR calc non Af Amer: 54 mL/min/{1.73_m2} — ABNORMAL LOW (ref >59)
GFR, EST AFRICAN AMERICAN: 62 mL/min/{1.73_m2} (ref >59)
GLUCOSE: 110 mg/dL — AB (ref 65–99)
Globulin, Total: 2.7 g/dL (ref 1.5–4.5)
Potassium: 4.7 mmol/L (ref 3.5–5.2)
Sodium: 142 mmol/L (ref 134–144)
TOTAL PROTEIN: 7.3 g/dL (ref 6.0–8.5)

## 2016-04-15 LAB — LIPID PANEL
Chol/HDL Ratio: 4.7 ratio units (ref 0.0–5.0)
Cholesterol, Total: 287 mg/dL — ABNORMAL HIGH (ref 100–199)
HDL: 61 mg/dL (ref >39)
Triglycerides: 501 mg/dL — ABNORMAL HIGH (ref 0–149)

## 2016-04-15 LAB — HIV ANTIBODY (ROUTINE TESTING W REFLEX): HIV SCREEN 4TH GENERATION: NONREACTIVE

## 2016-04-15 LAB — TSH: TSH: 152.1 u[IU]/mL — AB (ref 0.450–4.500)

## 2016-04-15 LAB — SPECIMEN STATUS REPORT

## 2016-04-15 LAB — HEPATITIS C ANTIBODY

## 2016-04-16 LAB — SPECIMEN STATUS REPORT

## 2016-04-16 LAB — VITAMIN D 25 HYDROXY (VIT D DEFICIENCY, FRACTURES): Vit D, 25-Hydroxy: 21.8 ng/mL — ABNORMAL LOW (ref 30.0–100.0)

## 2016-04-19 ENCOUNTER — Encounter: Payer: Self-pay | Admitting: *Deleted

## 2016-04-21 ENCOUNTER — Telehealth: Payer: Self-pay | Admitting: Family Medicine

## 2016-04-21 MED ORDER — FENOFIBRATE 145 MG PO TABS: 145.0000 mg | ORAL_TABLET | Freq: Every day | ORAL | 0 refills | Status: DC

## 2016-04-24 ENCOUNTER — Other Ambulatory Visit: Payer: Self-pay | Admitting: Nurse Practitioner

## 2016-04-28 ENCOUNTER — Other Ambulatory Visit: Payer: Self-pay

## 2016-04-28 MED ORDER — FENOFIBRATE 160 MG PO TABS: 160.0000 mg | ORAL_TABLET | Freq: Every day | ORAL | 3 refills | Status: DC

## 2016-05-04 DIAGNOSIS — E1142 Type 2 diabetes mellitus with diabetic polyneuropathy: Secondary | ICD-10-CM | POA: Diagnosis not present

## 2016-05-04 DIAGNOSIS — G894 Chronic pain syndrome: Secondary | ICD-10-CM | POA: Diagnosis not present

## 2016-05-04 DIAGNOSIS — K5900 Constipation, unspecified: Secondary | ICD-10-CM | POA: Diagnosis not present

## 2016-05-04 DIAGNOSIS — M47817 Spondylosis without myelopathy or radiculopathy, lumbosacral region: Secondary | ICD-10-CM | POA: Diagnosis not present

## 2016-06-01 DIAGNOSIS — K5900 Constipation, unspecified: Secondary | ICD-10-CM | POA: Diagnosis not present

## 2016-06-01 DIAGNOSIS — E1142 Type 2 diabetes mellitus with diabetic polyneuropathy: Secondary | ICD-10-CM | POA: Diagnosis not present

## 2016-06-01 DIAGNOSIS — G894 Chronic pain syndrome: Secondary | ICD-10-CM | POA: Diagnosis not present

## 2016-06-01 DIAGNOSIS — M47817 Spondylosis without myelopathy or radiculopathy, lumbosacral region: Secondary | ICD-10-CM | POA: Diagnosis not present

## 2016-07-01 DIAGNOSIS — K5900 Constipation, unspecified: Secondary | ICD-10-CM | POA: Diagnosis not present

## 2016-07-01 DIAGNOSIS — E1142 Type 2 diabetes mellitus with diabetic polyneuropathy: Secondary | ICD-10-CM | POA: Diagnosis not present

## 2016-07-01 DIAGNOSIS — M47817 Spondylosis without myelopathy or radiculopathy, lumbosacral region: Secondary | ICD-10-CM | POA: Diagnosis not present

## 2016-07-01 DIAGNOSIS — G894 Chronic pain syndrome: Secondary | ICD-10-CM | POA: Diagnosis not present

## 2016-07-14 ENCOUNTER — Ambulatory Visit: Payer: Medicare Other | Admitting: Family Medicine

## 2016-07-15 ENCOUNTER — Telehealth: Payer: Self-pay | Admitting: Family Medicine

## 2016-07-15 ENCOUNTER — Encounter: Payer: Self-pay | Admitting: Family Medicine

## 2016-07-22 ENCOUNTER — Encounter: Payer: Self-pay | Admitting: Family Medicine

## 2016-07-22 ENCOUNTER — Ambulatory Visit (INDEPENDENT_AMBULATORY_CARE_PROVIDER_SITE_OTHER): Payer: Medicare Other | Admitting: Family Medicine

## 2016-07-22 VITALS — BP 95/65 | HR 68 | Temp 96.8°F | Ht 74.0 in | Wt 240.8 lb

## 2016-07-22 DIAGNOSIS — E039 Hypothyroidism, unspecified: Secondary | ICD-10-CM

## 2016-07-22 DIAGNOSIS — E782 Mixed hyperlipidemia: Secondary | ICD-10-CM

## 2016-07-22 DIAGNOSIS — E119 Type 2 diabetes mellitus without complications: Secondary | ICD-10-CM | POA: Diagnosis not present

## 2016-07-22 DIAGNOSIS — Z794 Long term (current) use of insulin: Secondary | ICD-10-CM

## 2016-07-22 DIAGNOSIS — J449 Chronic obstructive pulmonary disease, unspecified: Secondary | ICD-10-CM | POA: Diagnosis not present

## 2016-07-22 LAB — BAYER DCA HB A1C WAIVED: HB A1C: 6.5 % (ref <7.0)

## 2016-07-22 MED ORDER — ROSUVASTATIN CALCIUM 10 MG PO TABS: 10.0000 mg | ORAL_TABLET | Freq: Every day | ORAL | 3 refills | Status: DC

## 2016-07-22 MED ORDER — FENOFIBRATE 160 MG PO TABS: 160.0000 mg | ORAL_TABLET | Freq: Every day | ORAL | 3 refills | Status: DC

## 2016-07-22 MED ORDER — BUDESONIDE-FORMOTEROL FUMARATE 160-4.5 MCG/ACT IN AERO: INHALATION_SPRAY | RESPIRATORY_TRACT | 2 refills | Status: DC

## 2016-07-22 MED ORDER — LEVOTHYROXINE SODIUM 150 MCG PO TABS: ORAL_TABLET | ORAL | 3 refills | Status: DC

## 2016-07-22 NOTE — Progress Notes (Signed)
BP 95/65   Pulse 68   Temp (!) 96.8 F (36 C) (Oral)   Ht 6' 2"  (1.88 m)   Wt 240 lb 12.8 oz (109.2 kg)   BMI 30.92 kg/m    Subjective:    Patient ID: Joseph [name], male    DOB: 1960/10/29, 56 y.o.   MRN: [id number]  HPI: Joseph [name] is a 56 y.o. male presenting on 07/22/2016 for Diabetes; Hypothyroidism; and Hyperlipidemia   HPI Type 2 diabetes recheck Patient comes in for a recheck for his type 2 diabetes. He is currently not on any medications for it and has been diet controlled and we'll recheck a hemoglobin A1c today. He denies any issues with his feet. He has not seen an ophthalmologist yet this year. He is not currently on an ACE inhibitor because we were monitoring his renal function but will likely be put on one in the next visit. We will likely do a very low-dose because his blood pressure has been 95/65.  Hyperlipidemia Patient is coming for recheck for his cholesterol. Last time he was here his triglycerides were over 500. His LDL was unreadable. He is currently on Crestor and was restarted that on his last visit. Patient denies headaches, blurred vision, chest pains, shortness of breath, or weakness. Denies any side effects from medication and is content with current medication.   Hypothyroidism recheck Patient is coming in for thyroid recheck. He is currently on Synthroid 150 g which was what he was on prior to our last visit. His TSH at that last visit was over 100 but he had been off of this medication for quite some time. He denies any issues with heat or cold or palpitations or constipation or diarrhea. He is feeling a lot better since he's been on his medication again.  COPD recheck Over the past few months patient said that he did have one time that he started getting a bronchitis but did not get a severe as it usually did for him. He says that he has not needed his albuterol inhaler at  all over the past few months. He denies any wheezing spells but has  had one coughing spell left with some right-sided rib soreness. He denies any fevers or chills or current respiratory symptoms today.  Relevant past medical, surgical, family and social history reviewed and updated as indicated. Interim medical history since our last visit reviewed. Allergies and medications reviewed and updated.  Review of Systems  Constitutional: Negative for chills and fever.  HENT: Negative for congestion, ear discharge, ear pain, postnasal drip, rhinorrhea, sinus pressure, sneezing, sore throat and voice change.   Eyes: Negative for pain, discharge, redness and visual disturbance.  Respiratory: Positive for cough. Negative for shortness of breath and wheezing.   Cardiovascular: Positive for chest pain (Chest wall pain on right lower ribs). Negative for palpitations and leg swelling.  Musculoskeletal: Negative for back pain and gait problem.  Skin: Negative for rash.  All other systems reviewed and are negative.   Per HPI unless specifically indicated above      Objective:    BP 95/65   Pulse 68   Temp (!) 96.8 F (36 C) (Oral)   Ht 6' 2"  (1.88 m)   Wt 240 lb 12.8 oz (109.2 kg)   BMI 30.92 kg/m   Wt Readings from Last 3 Encounters:  07/22/16 240 lb 12.8 oz (109.2 kg)  04/14/16 245 lb (111.1 kg)  10/24/15 240 lb 3.2 oz (109 kg)  Physical Exam  Constitutional: He is oriented to person, place, and time. He appears well-developed and well-nourished. No distress.  HENT:  Right Ear: Tympanic membrane, external ear and ear canal normal.  Left Ear: Tympanic membrane, external ear and ear canal normal.  Nose: No mucosal edema, rhinorrhea or sinus tenderness. No epistaxis. Right sinus exhibits no maxillary sinus tenderness and no frontal sinus tenderness. Left sinus exhibits no maxillary sinus tenderness and no frontal sinus tenderness.  Mouth/Throat: Uvula is midline and mucous membranes are normal. No oropharyngeal exudate, posterior oropharyngeal edema,  posterior oropharyngeal erythema or tonsillar abscesses.  Eyes: Conjunctivae and EOM are normal. Pupils are equal, round, and reactive to light. Right eye exhibits no discharge. No scleral icterus.  Neck: Neck supple. No thyromegaly present.  Cardiovascular: Normal rate, regular rhythm, normal heart sounds and intact distal pulses.   No murmur heard. Pulmonary/Chest: Effort normal and breath sounds normal. No respiratory distress. He has no wheezes. He has no rales. He exhibits tenderness (right chest wall).  Musculoskeletal: Normal range of motion. He exhibits no edema.  Lymphadenopathy:    He has no cervical adenopathy.  Neurological: He is alert and oriented to person, place, and time. Coordination normal.  Skin: Skin is warm and dry. No rash noted. He is not diaphoretic.  Psychiatric: He has a normal mood and affect. His behavior is normal.  Nursing note and vitals reviewed.     Assessment & Plan:   Problem List Items Addressed This Visit      Endocrine   Hypothyroidism   Relevant Medications   levothyroxine (SYNTHROID, LEVOTHROID) 150 MCG tablet   Other Relevant Orders   TSH   Diabetes mellitus ([place]) - Primary   Relevant Medications   rosuvastatin (CRESTOR) 10 MG tablet   Other Relevant Orders   CMP14+EGFR   Bayer DCA Hb A1c Waived     Other   HLD (hyperlipidemia)   Relevant Medications   fenofibrate 160 MG tablet   rosuvastatin (CRESTOR) 10 MG tablet   Other Relevant Orders   Lipid panel    Other Visit Diagnoses    Chronic obstructive pulmonary disease, unspecified COPD type ([place])       Relevant Medications   budesonide-formoterol (SYMBICORT) 160-4.5 MCG/ACT inhaler       Follow up plan: Return in about 3 months (around 10/20/2016), or if symptoms worsen or fail to improve, for Recheck thyroid and diabetes and cholesterol.  Counseling provided for all of the vaccine components Orders Placed This Encounter  Procedures  . CMP14+EGFR  . Bayer DCA Hb A1c Waived    . Lipid panel  . TSH    Caryl Pina, MD [facility] 07/22/2016, 2:17 PM

## 2016-07-23 LAB — CMP14+EGFR
ALT: 17 IU/L (ref 0–44)
AST: 23 IU/L (ref 0–40)
Albumin/Globulin Ratio: 1.7 (ref 1.2–2.2)
Albumin: 4.2 g/dL (ref 3.5–5.5)
Alkaline Phosphatase: 22 IU/L — ABNORMAL LOW (ref 39–117)
BILIRUBIN TOTAL: 0.2 mg/dL (ref 0.0–1.2)
BUN/Creatinine Ratio: 17 (ref 9–20)
BUN: 23 mg/dL (ref 6–24)
CALCIUM: 9.6 mg/dL (ref 8.7–10.2)
CO2: 26 mmol/L (ref 18–29)
Chloride: 99 mmol/L (ref 96–106)
Creatinine, Ser: 1.38 mg/dL — ABNORMAL HIGH (ref 0.76–1.27)
GFR, EST AFRICAN AMERICAN: 66 mL/min/{1.73_m2} (ref >59)
GFR, EST NON AFRICAN AMERICAN: 57 mL/min/{1.73_m2} — AB (ref >59)
GLUCOSE: 102 mg/dL — AB (ref 65–99)
Globulin, Total: 2.5 g/dL (ref 1.5–4.5)
Potassium: 4.5 mmol/L (ref 3.5–5.2)
Sodium: 138 mmol/L (ref 134–144)
TOTAL PROTEIN: 6.7 g/dL (ref 6.0–8.5)

## 2016-07-23 LAB — LIPID PANEL
Chol/HDL Ratio: 3 ratio units (ref 0.0–5.0)
Cholesterol, Total: 152 mg/dL (ref 100–199)
HDL: 51 mg/dL (ref >39)
LDL Calculated: 50 mg/dL (ref 0–99)
Triglycerides: 255 mg/dL — ABNORMAL HIGH (ref 0–149)
VLDL CHOLESTEROL CAL: 51 mg/dL — AB (ref 5–40)

## 2016-07-23 LAB — TSH: TSH: 20.16 u[IU]/mL — ABNORMAL HIGH (ref 0.450–4.500)

## 2016-07-26 ENCOUNTER — Other Ambulatory Visit: Payer: Self-pay | Admitting: *Deleted

## 2016-07-26 DIAGNOSIS — E039 Hypothyroidism, unspecified: Secondary | ICD-10-CM

## 2016-07-26 MED ORDER — LEVOTHYROXINE SODIUM 175 MCG PO TABS
ORAL_TABLET | ORAL | 1 refills | Status: DC
Start: 2016-07-26 — End: 2016-11-23

## 2016-08-06 DIAGNOSIS — M47817 Spondylosis without myelopathy or radiculopathy, lumbosacral region: Secondary | ICD-10-CM | POA: Diagnosis not present

## 2016-08-06 DIAGNOSIS — G894 Chronic pain syndrome: Secondary | ICD-10-CM | POA: Diagnosis not present

## 2016-08-06 DIAGNOSIS — E1142 Type 2 diabetes mellitus with diabetic polyneuropathy: Secondary | ICD-10-CM | POA: Diagnosis not present

## 2016-08-06 DIAGNOSIS — K5900 Constipation, unspecified: Secondary | ICD-10-CM | POA: Diagnosis not present

## 2016-08-24 DIAGNOSIS — F329 Major depressive disorder, single episode, unspecified: Secondary | ICD-10-CM | POA: Diagnosis not present

## 2016-09-01 DIAGNOSIS — G894 Chronic pain syndrome: Secondary | ICD-10-CM | POA: Diagnosis not present

## 2016-09-01 DIAGNOSIS — K5900 Constipation, unspecified: Secondary | ICD-10-CM | POA: Diagnosis not present

## 2016-09-01 DIAGNOSIS — M47817 Spondylosis without myelopathy or radiculopathy, lumbosacral region: Secondary | ICD-10-CM | POA: Diagnosis not present

## 2016-09-01 DIAGNOSIS — E1142 Type 2 diabetes mellitus with diabetic polyneuropathy: Secondary | ICD-10-CM | POA: Diagnosis not present

## 2016-09-29 DIAGNOSIS — M47817 Spondylosis without myelopathy or radiculopathy, lumbosacral region: Secondary | ICD-10-CM | POA: Diagnosis not present

## 2016-09-29 DIAGNOSIS — G894 Chronic pain syndrome: Secondary | ICD-10-CM | POA: Diagnosis not present

## 2016-09-29 DIAGNOSIS — E1142 Type 2 diabetes mellitus with diabetic polyneuropathy: Secondary | ICD-10-CM | POA: Diagnosis not present

## 2016-09-29 DIAGNOSIS — K5900 Constipation, unspecified: Secondary | ICD-10-CM | POA: Diagnosis not present

## 2016-10-10 ENCOUNTER — Other Ambulatory Visit: Payer: Self-pay | Admitting: Family Medicine

## 2016-10-10 DIAGNOSIS — E039 Hypothyroidism, unspecified: Secondary | ICD-10-CM

## 2016-10-20 ENCOUNTER — Encounter: Payer: Self-pay | Admitting: Family Medicine

## 2016-10-20 ENCOUNTER — Ambulatory Visit (INDEPENDENT_AMBULATORY_CARE_PROVIDER_SITE_OTHER): Payer: Medicare Other | Admitting: Family Medicine

## 2016-10-20 VITALS — BP 120/71 | HR 75 | Temp 98.0°F | Ht 74.0 in | Wt 239.0 lb

## 2016-10-20 DIAGNOSIS — G5601 Carpal tunnel syndrome, right upper limb: Secondary | ICD-10-CM

## 2016-10-20 DIAGNOSIS — E782 Mixed hyperlipidemia: Secondary | ICD-10-CM | POA: Diagnosis not present

## 2016-10-20 DIAGNOSIS — Z794 Long term (current) use of insulin: Secondary | ICD-10-CM | POA: Diagnosis not present

## 2016-10-20 DIAGNOSIS — N183 Chronic kidney disease, stage 3 unspecified: Secondary | ICD-10-CM

## 2016-10-20 DIAGNOSIS — E039 Hypothyroidism, unspecified: Secondary | ICD-10-CM

## 2016-10-20 DIAGNOSIS — E0822 Diabetes mellitus due to underlying condition with diabetic chronic kidney disease: Secondary | ICD-10-CM | POA: Diagnosis not present

## 2016-10-20 DIAGNOSIS — L249 Irritant contact dermatitis, unspecified cause: Secondary | ICD-10-CM | POA: Diagnosis not present

## 2016-10-20 LAB — BAYER DCA HB A1C WAIVED: HB A1C (BAYER DCA - WAIVED): 6 % (ref <7.0)

## 2016-10-20 MED ORDER — TRIAMCINOLONE ACETONIDE 0.1 % EX CREA: 1.0000 "application " | TOPICAL_CREAM | Freq: Two times a day (BID) | CUTANEOUS | 1 refills | Status: DC

## 2016-10-20 MED ORDER — RIZATRIPTAN BENZOATE 10 MG PO TABS: ORAL_TABLET | ORAL | 2 refills | Status: DC

## 2016-10-20 NOTE — Progress Notes (Signed)
BP 120/71   Pulse 75   Temp 98 F (36.7 C) (Oral)   Ht 6\' 2"  (1.88 m)   Wt 239 lb (108.4 kg)   BMI 30.69 kg/m    Subjective:    Patient ID: Joseph [name], male    DOB: 02-05-1961, 56 y.o.   MRN: [id number]  HPI: Joseph [name] is a 56 y.o. male presenting on 10/20/2016 for Hypothyroidism (3 month followup); Hyperlipidemia; Diabetes; Medication Refill ([place]); and Rash (both arms, itchy rash that breaks when he is exposed to sunlight)   HPI Hypothyroidism recheck Patient is coming in for thyroid recheck today as well. He denies any issues with hair changes or heat or cold problems or diarrhea or constipation. He denies any chest pain or palpitations. He is currently on levothyroxine 150micrograms. His levels of been too low on the last 2 occasions and he is coming in today for recheck. He says he is feeling better with the higher dose of medication.  Type 2 diabetes mellitus Patient comes in today for recheck of his diabetes. Patient has been currently taking diet control and has been doing well on it. Patient is not currently on an ACE inhibitor. Patient has not seen an ophthalmologist this year. Patient denies any issues with his feet.    Hyperlipidemia Patient is coming in for recheck of his hyperlipidemia. He is currently taking fenofibrate and Crestor. He denies any issues with myalgias or history of liver damage from it. He denies any focal numbness or weakness or chest pain.   Rash on arms Patient is coming in with complaints of a rash on his arms that he gets every year when the son starts coming out. He feels like it's the son that causes his irritation and the rash to start up again. He says it is very pruritic. Small pink bumps and he denies any drainage or erythema or warmth from it.  Wrist pain and numbness Patient comes in today complaining of right wrist pain and numbness going into the first 3 fingers of the right hand. He has been having issues with this over  the past couple months. He feels like he is turning blue some grip strength in that hand. He does not recall dropping things from that and just yet. He denies any overlying skin changes or trauma to the area. He denies any neck pain.  Relevant past medical, surgical, family and social history reviewed and updated as indicated. Interim medical history since our last visit reviewed. Allergies and medications reviewed and updated.  Review of Systems  Constitutional: Negative for chills and fever.  Eyes: Negative for discharge.  Respiratory: Negative for shortness of breath and wheezing.   Cardiovascular: Negative for chest pain and leg swelling.  Endocrine: Negative for cold intolerance, heat intolerance, polydipsia and polyuria.  Musculoskeletal: Negative for back pain, gait problem and neck pain.  Skin: Positive for rash.  Neurological: Positive for weakness and numbness.  All other systems reviewed and are negative.   Per HPI unless specifically indicated above     Objective:    BP 120/71   Pulse 75   Temp 98 F (36.7 C) (Oral)   Ht 6\' 2"  (1.88 m)   Wt 239 lb (108.4 kg)   BMI 30.69 kg/m   Wt Readings from Last 3 Encounters:  10/20/16 239 lb (108.4 kg)  07/22/16 240 lb 12.8 oz (109.2 kg)  04/14/16 245 lb (111.1 kg)    Physical Exam  Constitutional: He is oriented  to person, place, and time. He appears well-developed and well-nourished. No distress.  Eyes: Conjunctivae are normal. No scleral icterus.  Cardiovascular: Normal rate, regular rhythm, normal heart sounds and intact distal pulses.   No murmur heard. Pulmonary/Chest: Effort normal and breath sounds normal. No respiratory distress. He has no wheezes. He has no rales.  Musculoskeletal: Normal range of motion. He exhibits no edema.  Neurological: He is alert and oriented to person, place, and time. Coordination normal.  Positive Tinel's, right wrist tenderness, no decreased grip strength on exam.  Skin: Skin is warm  and dry. Rash noted. Rash is papular (Papular rash with excoriations on both arms. No signs of erythema or warmth or drainage from the spots). He is not diaphoretic.  Psychiatric: He has a normal mood and affect. His behavior is normal.  Nursing note and vitals reviewed.     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[place])   Relevant Orders   Bayer DCA Hb A1c Waived     Other   HLD (hyperlipidemia) - Primary    Other Visit Diagnoses    Carpal tunnel syndrome of right wrist       He will talk to his neurologist first and then let us know if he needs anything from our end   Irritant dermatitis       He says is more sensitive to the sun and gets it on his arms, very pruritic, recommended antihistamine and times   Relevant Medications   triamcinolone cream (KENALOG) 0.1 %       Follow up plan: Return in about 3 months (around 01/19/2017), or if symptoms worsen or fail to improve, for Recheck diabetes and thyroid.  Counseling provided for all of the vaccine components Orders Placed This Encounter  Procedures  . TSH  . Bayer [facility] Hb A1c Ina, MD [facility] 10/20/2016, 2:47 PM

## 2016-10-21 LAB — TSH: TSH: 2.09 u[IU]/mL (ref 0.450–4.500)

## 2016-10-27 DIAGNOSIS — G894 Chronic pain syndrome: Secondary | ICD-10-CM | POA: Diagnosis not present

## 2016-10-27 DIAGNOSIS — E1142 Type 2 diabetes mellitus with diabetic polyneuropathy: Secondary | ICD-10-CM | POA: Diagnosis not present

## 2016-10-27 DIAGNOSIS — M47817 Spondylosis without myelopathy or radiculopathy, lumbosacral region: Secondary | ICD-10-CM | POA: Diagnosis not present

## 2016-10-27 DIAGNOSIS — M7711 Lateral epicondylitis, right elbow: Secondary | ICD-10-CM | POA: Diagnosis not present

## 2016-10-27 DIAGNOSIS — K5900 Constipation, unspecified: Secondary | ICD-10-CM | POA: Diagnosis not present

## 2016-11-23 ENCOUNTER — Ambulatory Visit (INDEPENDENT_AMBULATORY_CARE_PROVIDER_SITE_OTHER): Payer: Medicare Other

## 2016-11-23 VITALS — BP 121/82 | HR 65 | Temp 97.8°F | Ht 73.25 in | Wt 237.0 lb

## 2016-11-23 DIAGNOSIS — Z0000 Encounter for general adult medical examination without abnormal findings: Secondary | ICD-10-CM | POA: Diagnosis not present

## 2016-11-23 NOTE — Patient Instructions (Signed)
  Joseph [name] , Thank you for taking time to come for your Medicare Wellness Visit. I appreciate your ongoing commitment to your health goals. Please review the following plan we discussed and let me know if I can assist you in the future.   These are the goals we discussed: Goals    . Exercise 3x per week (30 min per time)    . Have 3 meals a day       This is a list of the screening recommended for you and due dates:  Health Maintenance  Topic Date Due  . Urine Protein Check  06/12/2014  . Complete foot exam   08/15/2015  . Stool Blood Test  01/16/2017*  . Flu Shot  02/16/2017  . Eye exam for diabetics  03/06/2017  . Hemoglobin A1C  04/21/2017  . Pneumococcal vaccine (2) 04/28/2017  . Tetanus Vaccine  12/16/2021  .  Hepatitis C: One time screening is recommended by Center for Disease Control  (CDC) for  adults born from [birth year] through 1965.   Completed  . HIV Screening  Completed  *Topic was postponed. The date shown is not the original due date.    Follow up with your PCP, Dr. Warrick Parisian, on 01/24/17.

## 2016-11-23 NOTE — Progress Notes (Signed)
Subjective:   Joseph [name] is a 56 y.o. male who presents for an Initial Medicare Annual Wellness Visit.  Review of Systems  Patient is here today for his initial Medicare annual wellness visit.  Joseph [name] is a 56 year old who enjoys piddling around in his shed and doing woodworking.  He also has four Fransisco Beau terriers who occupy a lot of his time.  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  He does not attend church or belong to any community organizations at this time.  He lives at home with his wife.  He has two step-children along with 3 grandsons.  The patient feels that since last year his health has remained about the same.       Objective:    Today's Vitals   11/23/16 1437  BP: 121/82  Pulse: 65  Temp: 97.8 F (36.6 C)  TempSrc: Oral  Weight: 237 lb (107.5 kg)  Height: 6' 1.25" (1.861 m)   Body mass index is 31.06 kg/m.  Current Medications (verified) Outpatient Encounter Prescriptions as of 11/23/2016  Medication Sig  . B-D INS SYRINGE 0.5CC/31GX5/16 31G X 5/16" 0.5 ML MISC USE TWICE DAILY TO ADMINISTER INSULIN  . budesonide-formoterol (SYMBICORT) 160-4.5 MCG/ACT inhaler INHALE 2 PUFFS INTO THE LUNGS 2 (TWO) TIMES DAILY. (Patient taking differently: Inhale 2 puffs into the lungs. INHALE 2 PUFFS INTO THE LUNGS 2 (TWO) TIMES DAILy as needed)  . Cholecalciferol (VITAMIN D3) 5000 units CAPS Take 5,000 Units by mouth.  . doxepin (SINEQUAN) 10 MG capsule 10 mg at bedtime.   . fenofibrate 160 MG tablet Take 1 tablet (160 mg total) by mouth daily.  Marland Kitchen gabapentin (NEURONTIN) 300 MG capsule Take 3 capsules (900 mg total) by mouth 2 (two) times daily. (Patient taking differently: Take 600 mg by mouth 4 (four) times daily. )  . glucose blood (ACCU-CHEK AVIVA) test strip Test blood sugar before meals and at bedtime  . levothyroxine (SYNTHROID, LEVOTHROID) 150 MCG tablet Take 150 mcg by mouth daily before  breakfast.  . morphine (KADIAN) 60 MG 24 hr capsule Take 60 mg by mouth daily.    Marland Kitchen oxyCODONE-acetaminophen (PERCOCET) 7.5-325 MG per tablet Take 1 tablet by mouth every 6 (six) hours as needed.    . pantoprazole (PROTONIX) 20 MG tablet TAKE 1 TABLET (20 MG TOTAL) BY MOUTH DAILY.  Marland Kitchen QUEtiapine (SEROQUEL) 100 MG tablet Take 200 mg by mouth at bedtime.   . rizatriptan (MAXALT) 10 MG tablet TAKE 1 TABLET (10 MG TOTAL) BY MOUTH AS NEEDED. MAY REPEAT IN 2 HOURS IF NEEDED  . rosuvastatin (CRESTOR) 10 MG tablet Take 1 tablet (10 mg total) by mouth daily.  . sertraline (ZOLOFT) 100 MG tablet Take 200 mg by mouth daily.   . tamsulosin (FLOMAX) 0.4 MG CAPS capsule Take 0.4 mg by mouth 2 (two) times daily.   Marland Kitchen tiZANidine (ZANAFLEX) 2 MG tablet TAKE 2 TABLETS 4 TIMES A DAY AS NEEDED  . traZODone (DESYREL) 100 MG tablet Take 100 mg by mouth at bedtime.   . triamcinolone cream (KENALOG) 0.1 % Apply 1 application topically 2 (two) times daily.  . [DISCONTINUED] furosemide (LASIX) 40 MG tablet Take 1 tablet (40 mg total) by mouth daily. As directed  . [DISCONTINUED] hydrOXYzine (ATARAX/VISTARIL) 50 MG tablet Take 50 mg by mouth 4 (four) times daily.  . [DISCONTINUED] levothyroxine (SYNTHROID, LEVOTHROID) 175 MCG tablet TAKE 1 TABLET BY MOUTH DAILY.  . [  DISCONTINUED] Linaclotide (LINZESS) 290 MCG CAPS capsule Take 1 capsule (290 mcg total) by mouth daily.  . [DISCONTINUED] SEROQUEL XR 150 MG 24 hr tablet TAKE 1 TABLET (150 MG TOTAL) BY MOUTH AT BEDTIME.   No facility-administered encounter medications on file as of 11/23/2016.     Allergies (verified) Patient has no known allergies.   History: Past Medical History:  Diagnosis Date  . Anxiety   . COPD (chronic obstructive pulmonary disease) ([place])    AB clinical dx; HFA 75% 02/28/10 > 90% Sept 21, 2011  . Depression   . Diabetes mellitus   . Hyperlipidemia   . Weight gain    After quitting smoking in 2005   Past Surgical History:  Procedure Laterality  Date  . KNEE ARTHROSCOPY     right  . 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 Emphysema Sister   . Prostate cancer Maternal Grandfather   . Clotting disorder Maternal Grandfather   . Cancer Maternal Grandmother     brain  . Atopy Neg Hx    Social History   Occupational History  . Not on file.   Social History Main Topics  . Smoking status: Former Smoker    Packs/day: 3.00    Years: 30.00    Quit date: 07/20/2003  . Smokeless tobacco: Never Used  . Alcohol use 0.0 oz/week     Comment: occasional  . Drug use: No  . Sexual activity: Not on file   Tobacco Counseling The patient is a former smoker who quit smoking after his cancer diagnosis.  Activities of Daily Living In your present state of health, do you have any difficulty performing the following activities: 11/23/2016  Hearing? Y  Vision? N  Difficulty concentrating or making decisions? N  Walking or climbing stairs? Y  Dressing or bathing? N  Doing errands, shopping? N  Some recent data might be hidden   Patient reports that since having radiation and chemotherapy he has noticed some decreased hearing, ringing in his ears and problems with balance.  He also reports he has difficulty climbing stairs because of his back pain.    Immunizations and Health Maintenance Immunization History  Administered Date(s) Administered  . Influenza,inj,Quad PF,36+ Mos 06/12/2013, 04/14/2016  . Influenza-Unspecified 05/22/2015  . Pneumococcal-Unspecified 04/28/2012  . Tdap 12/17/2011   Health Maintenance Due  Topic Date Due  . URINE MICROALBUMIN  06/12/2014  . FOOT EXAM  08/15/2015   Patient is due for a colonoscopy but does not wish to set up referral today.  He would like to discuss with his PCP at next office visit.  He is up to date on his vaccines.  Patient Care Team: Dettinger, Fransisca Kaufmann, MD as PCP - General (Family Medicine) Margaretha Sheffield, MD as  Referring Physician (Physical Medicine and Rehabilitation) Services, [facility] as Referring Physician (Psychiatry) Festus Aloe, MD as Consulting Physician (Urology)  Indicate any recent Medical Services you may have received from other than [facility] providers in the past year (date may be approximate).    Assessment:   This is a routine wellness examination for Joseph [name].  Hearing/Vision screen Patient has difficulty hearing, ringing in ears and dizziness since he had radiation and chemotherapy several years ago.  He feels his vision is good and he sees his eye doctor regularly with his last visit being 01/24/2017.    Dietary issues and exercise activities discussed:    Goals    .  Exercise 3x per week (30 min per time)    . Have 3 meals a day      Patient reports he does exercise some, but agrees he needs to commit to 3 times per week for 30 minutes.  He also needs to eat three meals a day.  He normally eats healthy but only twice per day, without any snacking.   Depression Screen PHQ 2/9 Scores 11/23/2016 10/20/2016 07/22/2016 04/14/2016  PHQ - 2 Score 1 0 0 0  PHQ- 9 Score - - - -    Fall Risk Fall Risk  11/23/2016 04/14/2016 10/24/2015 05/30/2015 09/16/2014  Falls in the past year? No No No No Yes  Number falls in past yr: - - - - 2 or more  Injury with Fall? - - - - No    Cognitive Function: MMSE - Mini Mental State Exam 11/23/2016  Orientation to time 5  Orientation to Place 5  Registration 3  Attention/ Calculation 5  Recall 3  Language- name 2 objects 2  Language- repeat 1  Language- follow 3 step command 3  Language- read & follow direction 1  Write a sentence 1  Copy design 1  Total score 30    Patient performed well during the MMSE earning a total of 30 out of 30 available points.    Screening Tests Health Maintenance  Topic Date Due  . URINE MICROALBUMIN  06/12/2014  . FOOT EXAM  08/15/2015  . COLON CANCER SCREENING ANNUAL FOBT  01/16/2017  (Originally 09/05/2010)  . INFLUENZA VACCINE  02/16/2017  . OPHTHALMOLOGY EXAM  03/06/2017  . HEMOGLOBIN A1C  04/21/2017  . PNEUMOCOCCAL POLYSACCHARIDE VACCINE (2) 04/28/2017  . TETANUS/TDAP  12/16/2021  . Hepatitis C Screening  Completed  . HIV Screening  Completed        Plan:   Followup with PCP on 01/24/17 for labwork, EKG, chest x-ray and discussion of colonoscopy.  I have personally reviewed and noted the following in the patient's chart:   . Medical and social history . Use of alcohol, tobacco or illicit drugs  . Current medications and supplements . Functional ability and status . Nutritional status . Physical activity . Advanced directives . List of other physicians . Hospitalizations, surgeries, and ER visits in previous 12 months . Vitals . Screenings to include cognitive, depression, and falls . Referrals and appointments  In addition, I have reviewed and discussed with patient certain preventive protocols, quality metrics, and best practice recommendations. A written personalized care plan for preventive services as well as general preventive health recommendations were provided to patient.     Burnadette Pop, LPN   0/09/2120    I have reviewed and agree with the above AWV documentation.   Evelina Dun, FNP

## 2016-11-29 ENCOUNTER — Other Ambulatory Visit: Payer: Self-pay | Admitting: Family Medicine

## 2016-11-29 DIAGNOSIS — G894 Chronic pain syndrome: Secondary | ICD-10-CM | POA: Diagnosis not present

## 2016-11-29 DIAGNOSIS — K5900 Constipation, unspecified: Secondary | ICD-10-CM | POA: Diagnosis not present

## 2016-11-29 DIAGNOSIS — M7711 Lateral epicondylitis, right elbow: Secondary | ICD-10-CM | POA: Diagnosis not present

## 2016-11-29 DIAGNOSIS — E1142 Type 2 diabetes mellitus with diabetic polyneuropathy: Secondary | ICD-10-CM | POA: Diagnosis not present

## 2016-11-29 DIAGNOSIS — M47817 Spondylosis without myelopathy or radiculopathy, lumbosacral region: Secondary | ICD-10-CM | POA: Diagnosis not present

## 2016-11-29 DIAGNOSIS — K219 Gastro-esophageal reflux disease without esophagitis: Secondary | ICD-10-CM

## 2016-12-14 DIAGNOSIS — F329 Major depressive disorder, single episode, unspecified: Secondary | ICD-10-CM | POA: Diagnosis not present

## 2016-12-28 DIAGNOSIS — G894 Chronic pain syndrome: Secondary | ICD-10-CM | POA: Diagnosis not present

## 2016-12-28 DIAGNOSIS — E1142 Type 2 diabetes mellitus with diabetic polyneuropathy: Secondary | ICD-10-CM | POA: Diagnosis not present

## 2016-12-28 DIAGNOSIS — Z79891 Long term (current) use of opiate analgesic: Secondary | ICD-10-CM | POA: Diagnosis not present

## 2016-12-28 DIAGNOSIS — M47817 Spondylosis without myelopathy or radiculopathy, lumbosacral region: Secondary | ICD-10-CM | POA: Diagnosis not present

## 2016-12-28 DIAGNOSIS — K5900 Constipation, unspecified: Secondary | ICD-10-CM | POA: Diagnosis not present

## 2016-12-29 DIAGNOSIS — G5622 Lesion of ulnar nerve, left upper limb: Secondary | ICD-10-CM | POA: Diagnosis not present

## 2016-12-29 DIAGNOSIS — M19011 Primary osteoarthritis, right shoulder: Secondary | ICD-10-CM | POA: Diagnosis not present

## 2016-12-29 DIAGNOSIS — F329 Major depressive disorder, single episode, unspecified: Secondary | ICD-10-CM | POA: Diagnosis not present

## 2016-12-29 DIAGNOSIS — G5601 Carpal tunnel syndrome, right upper limb: Secondary | ICD-10-CM | POA: Diagnosis not present

## 2016-12-29 DIAGNOSIS — M47817 Spondylosis without myelopathy or radiculopathy, lumbosacral region: Secondary | ICD-10-CM | POA: Diagnosis not present

## 2016-12-29 DIAGNOSIS — E1142 Type 2 diabetes mellitus with diabetic polyneuropathy: Secondary | ICD-10-CM | POA: Diagnosis not present

## 2016-12-29 DIAGNOSIS — K5900 Constipation, unspecified: Secondary | ICD-10-CM | POA: Diagnosis not present

## 2016-12-29 DIAGNOSIS — M7551 Bursitis of right shoulder: Secondary | ICD-10-CM | POA: Diagnosis not present

## 2016-12-29 DIAGNOSIS — M62830 Muscle spasm of back: Secondary | ICD-10-CM | POA: Diagnosis not present

## 2016-12-29 DIAGNOSIS — Z79891 Long term (current) use of opiate analgesic: Secondary | ICD-10-CM | POA: Diagnosis not present

## 2016-12-29 DIAGNOSIS — G4700 Insomnia, unspecified: Secondary | ICD-10-CM | POA: Diagnosis not present

## 2017-01-18 DIAGNOSIS — R3916 Straining to void: Secondary | ICD-10-CM | POA: Diagnosis not present

## 2017-01-18 DIAGNOSIS — Z125 Encounter for screening for malignant neoplasm of prostate: Secondary | ICD-10-CM | POA: Diagnosis not present

## 2017-01-18 DIAGNOSIS — N5201 Erectile dysfunction due to arterial insufficiency: Secondary | ICD-10-CM | POA: Diagnosis not present

## 2017-01-18 DIAGNOSIS — N401 Enlarged prostate with lower urinary tract symptoms: Secondary | ICD-10-CM | POA: Diagnosis not present

## 2017-01-24 ENCOUNTER — Encounter: Payer: Self-pay | Admitting: Family Medicine

## 2017-01-24 ENCOUNTER — Ambulatory Visit (INDEPENDENT_AMBULATORY_CARE_PROVIDER_SITE_OTHER): Payer: Medicare Other | Admitting: Family Medicine

## 2017-01-24 VITALS — BP 99/58 | HR 75 | Ht 73.25 in | Wt 233.0 lb

## 2017-01-24 DIAGNOSIS — Z794 Long term (current) use of insulin: Secondary | ICD-10-CM

## 2017-01-24 DIAGNOSIS — E782 Mixed hyperlipidemia: Secondary | ICD-10-CM | POA: Diagnosis not present

## 2017-01-24 DIAGNOSIS — E039 Hypothyroidism, unspecified: Secondary | ICD-10-CM | POA: Diagnosis not present

## 2017-01-24 DIAGNOSIS — N183 Chronic kidney disease, stage 3 (moderate): Secondary | ICD-10-CM

## 2017-01-24 DIAGNOSIS — J439 Emphysema, unspecified: Secondary | ICD-10-CM | POA: Diagnosis not present

## 2017-01-24 DIAGNOSIS — L249 Irritant contact dermatitis, unspecified cause: Secondary | ICD-10-CM | POA: Diagnosis not present

## 2017-01-24 DIAGNOSIS — E0822 Diabetes mellitus due to underlying condition with diabetic chronic kidney disease: Secondary | ICD-10-CM | POA: Diagnosis not present

## 2017-01-24 LAB — BAYER DCA HB A1C WAIVED: HB A1C (BAYER DCA - WAIVED): 6.1 % (ref <7.0)

## 2017-01-24 MED ORDER — ALBUTEROL SULFATE HFA 108 (90 BASE) MCG/ACT IN AERS: 2.0000 | INHALATION_SPRAY | Freq: Four times a day (QID) | RESPIRATORY_TRACT | 0 refills | Status: DC | PRN

## 2017-01-24 MED ORDER — TIOTROPIUM BROMIDE MONOHYDRATE 18 MCG IN CAPS: 18.0000 ug | ORAL_CAPSULE | Freq: Every day | RESPIRATORY_TRACT | 6 refills | Status: DC

## 2017-01-24 MED ORDER — TRIAMCINOLONE ACETONIDE 0.1 % EX CREA: 1.0000 "application " | TOPICAL_CREAM | Freq: Two times a day (BID) | CUTANEOUS | 1 refills | Status: DC

## 2017-01-24 NOTE — Progress Notes (Signed)
BP (!) 99/58   Pulse 75   Ht 6' 1.25" (1.861 m)   Wt 233 lb (105.7 kg)   BMI 30.53 kg/m    Subjective:    Patient ID: Joseph [name], male    DOB: [birth year], 56 y.o.   MRN: [id number]  HPI: Joseph [name] is a 56 y.o. male presenting on 01/24/2017 for Diabetes (pt here today for routine follow up of his chronic medical conditions)   HPI Hypothyroidism recheck Patient is coming in for thyroid recheck today as well. They deny any issues with hair changes or heat or cold problems or diarrhea or constipation. They deny any chest pain or palpitations. They are currently on levothyroxine 150 micrograms   Hyperlipidemia Patient is coming in for recheck of his hyperlipidemia. The patient is currently taking fenofibrate and Crestor. They deny any issues with myalgias or history of liver damage from it. They deny any focal numbness or weakness or chest pain.   Type 2 diabetes mellitus Patient comes in today for recheck of his diabetes. Patient has been currently taking diet control. Patient is not currently on an ACE inhibitor/ARB. Patient has not seen an ophthalmologist this year. Patient denies any issues with their feet.   COPD Patient is coming in for COPD recheck. He says that he is having a lot more issues with humidity and heat and is having almost daily symptoms of coughing and wheezing. He denies any severe shortness of breath chest pain or palpitations. He denies any nighttime issues. He is currently on Symbicort but is not using it on a daily basis because it makes her have tremors and agitated so he has stopped taking it except for when he gets really bad. He does not currently have a rescue inhaler.  Refill on triamcinolone for dermatitis Patient is coming today because wants refill on history insulin for his dermatitis and he says it works very well to keep a his legs he has it when he runs out of those flareups sometimes. He says that the rash is only in small patches on his  arm is not too bad currently.  Relevant past medical, surgical, family and social history reviewed and updated as indicated. Interim medical history since our last visit reviewed. Allergies and medications reviewed and updated.  Review of Systems  Constitutional: Negative for chills and fever.  Respiratory: Negative for cough, shortness of breath and wheezing.   Cardiovascular: Negative for chest pain, palpitations and leg swelling.  Gastrointestinal: Negative for abdominal pain, blood in stool, constipation and diarrhea.  Genitourinary: Negative for dysuria and hematuria.  Musculoskeletal: Negative for back pain, gait problem and myalgias.  Skin: Negative for rash.  Neurological: Negative for dizziness, weakness and headaches.  Psychiatric/Behavioral: Negative for suicidal ideas.  All other systems reviewed and are negative.   Per HPI unless specifically indicated above      Objective:    BP (!) 99/58   Pulse 75   Ht 6' 1.25" (1.861 m)   Wt 233 lb (105.7 kg)   BMI 30.53 kg/m   Wt Readings from Last 3 Encounters:  01/24/17 233 lb (105.7 kg)  11/23/16 237 lb (107.5 kg)  10/20/16 239 lb (108.4 kg)    Physical Exam  Constitutional: He is oriented to person, place, and time. He appears well-developed and well-nourished. No distress.  HENT:  Patient has tracheostomy  Eyes: Conjunctivae are normal. No scleral icterus.  Neck: Neck supple. No thyromegaly present.  Cardiovascular: Normal rate, regular rhythm, normal  heart sounds and intact distal pulses.   No murmur heard. Pulmonary/Chest: Effort normal and breath sounds normal. No respiratory distress. He has no wheezes. He has no rales.  Musculoskeletal: Normal range of motion. He exhibits no edema.  Lymphadenopathy:    He has no cervical adenopathy.  Neurological: He is alert and oriented to person, place, and time. Coordination normal.  Skin: Skin is warm and dry. Rash noted. Rash is maculopapular (Small patches of  maculopapular rash with excoriations on posterior forearms). He is not diaphoretic.  Psychiatric: He has a normal mood and affect. His behavior is normal.  Nursing note and vitals reviewed.  Diabetic Foot Exam - Simple   Simple Foot Form Diabetic Foot exam was performed with the following findings:  Yes 01/24/2017  2:56 PM  Visual Inspection No deformities, no ulcerations, no other skin breakdown bilaterally:  Yes Sensation Testing Intact to touch and monofilament testing bilaterally:  Yes Pulse Check Posterior Tibialis and Dorsalis pulse intact bilaterally:  Yes Comments       Results for orders placed or performed in visit on 10/20/16  TSH  Result Value Ref Range   TSH 2.090 0.450 - 4.500 uIU/mL  Bayer DCA Hb A1c Waived  Result Value Ref Range   Bayer DCA Hb A1c Waived 6.0 <7.0 %      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   Relevant Medications   triamcinolone cream (KENALOG) 0.1 %       Follow up plan: Return if symptoms worsen or fail to improve.  Counseling provided for all of the vaccine components Orders Placed This Encounter  Procedures  . TSH  . CMP14+EGFR  . Lipid panel  . Bayer [facility] Hb A1c Schubert, MD [facility] 01/24/2017,  3:02 PM

## 2017-01-25 LAB — CMP14+EGFR
A/G RATIO: 1.8 (ref 1.2–2.2)
ALK PHOS: 24 IU/L — AB (ref 39–117)
ALT: 27 IU/L (ref 0–44)
AST: 30 IU/L (ref 0–40)
Albumin: 4.4 g/dL (ref 3.5–5.5)
BILIRUBIN TOTAL: 0.3 mg/dL (ref 0.0–1.2)
BUN/Creatinine Ratio: 17 (ref 9–20)
BUN: 23 mg/dL (ref 6–24)
CALCIUM: 9.6 mg/dL (ref 8.7–10.2)
CHLORIDE: 104 mmol/L (ref 96–106)
CO2: 23 mmol/L (ref 20–29)
Creatinine, Ser: 1.36 mg/dL — ABNORMAL HIGH (ref 0.76–1.27)
GFR calc Af Amer: 67 mL/min/{1.73_m2} (ref >59)
GFR, EST NON AFRICAN AMERICAN: 58 mL/min/{1.73_m2} — AB (ref >59)
Globulin, Total: 2.5 g/dL (ref 1.5–4.5)
Glucose: 103 mg/dL — ABNORMAL HIGH (ref 65–99)
POTASSIUM: 4.7 mmol/L (ref 3.5–5.2)
Sodium: 140 mmol/L (ref 134–144)
Total Protein: 6.9 g/dL (ref 6.0–8.5)

## 2017-01-25 LAB — TSH: TSH: 7.02 u[IU]/mL — AB (ref 0.450–4.500)

## 2017-01-25 LAB — LIPID PANEL
CHOLESTEROL TOTAL: 147 mg/dL (ref 100–199)
Chol/HDL Ratio: 3.1 ratio (ref 0.0–5.0)
HDL: 47 mg/dL (ref >39)
LDL Calculated: 46 mg/dL (ref 0–99)
TRIGLYCERIDES: 268 mg/dL — AB (ref 0–149)
VLDL Cholesterol Cal: 54 mg/dL — ABNORMAL HIGH (ref 5–40)

## 2017-01-28 ENCOUNTER — Telehealth: Payer: Self-pay | Admitting: Family Medicine

## 2017-01-28 ENCOUNTER — Other Ambulatory Visit: Payer: Self-pay | Admitting: *Deleted

## 2017-01-28 MED ORDER — LEVOTHYROXINE SODIUM 175 MCG PO TABS: 175.0000 ug | ORAL_TABLET | Freq: Every day | ORAL | 0 refills | Status: DC

## 2017-01-28 NOTE — Telephone Encounter (Signed)
Will call back later.

## 2017-02-07 ENCOUNTER — Encounter (HOSPITAL_COMMUNITY): Payer: Self-pay

## 2017-02-07 ENCOUNTER — Emergency Department (HOSPITAL_COMMUNITY)
Admission: EM | Admit: 2017-02-07 | Discharge: 2017-02-07 | Disposition: A | Discharge status: Home / Self Care | Payer: Medicare Other | Attending: Emergency Medicine | Admitting: Emergency Medicine

## 2017-02-07 DIAGNOSIS — E119 Type 2 diabetes mellitus without complications: Secondary | ICD-10-CM | POA: Insufficient documentation

## 2017-02-07 DIAGNOSIS — Z23 Encounter for immunization: Secondary | ICD-10-CM | POA: Insufficient documentation

## 2017-02-07 DIAGNOSIS — Z203 Contact with and (suspected) exposure to rabies: Secondary | ICD-10-CM

## 2017-02-07 DIAGNOSIS — Z79899 Other long term (current) drug therapy: Secondary | ICD-10-CM | POA: Insufficient documentation

## 2017-02-07 DIAGNOSIS — E039 Hypothyroidism, unspecified: Secondary | ICD-10-CM | POA: Diagnosis not present

## 2017-02-07 DIAGNOSIS — Z87891 Personal history of nicotine dependence: Secondary | ICD-10-CM | POA: Diagnosis not present

## 2017-02-07 DIAGNOSIS — J449 Chronic obstructive pulmonary disease, unspecified: Secondary | ICD-10-CM | POA: Insufficient documentation

## 2017-02-07 MED ORDER — RABIES IMMUNE GLOBULIN 150 UNIT/ML IM INJ
20.0000 [IU]/kg | INJECTION | Freq: Once | INTRAMUSCULAR | Status: AC
  Administered 2017-02-07: 2175 [IU] via INTRAMUSCULAR
  Filled 2017-02-07: qty 16

## 2017-02-07 MED ORDER — RABIES VACCINE, PCEC IM SUSR
1.0000 mL | Freq: Once | INTRAMUSCULAR | Status: AC
  Administered 2017-02-07: 1 mL via INTRAMUSCULAR
  Filled 2017-02-07: qty 1

## 2017-02-07 NOTE — Discharge Instructions (Signed)
Your vital signs within normal limits. You were treated today with the initial dose of rabies vaccines. You need to additional doses on days 7 and a 14 after the initial dosing of the vaccine medications. Please have your dogs observed by the animal control team. Please see your primary physician or return to the emergency department if any emergent changes, problems, or concerns.

## 2017-02-07 NOTE — ED Provider Notes (Signed)
[facility] DEPT Provider Note   CSN: [id number] Arrival date & time: 02/07/17  0808     History   Chief Complaint Chief Complaint  Patient presents with  . Rabies Injection    HPI Joseph [name] is a 56 y.o. male.  Patient is a 56 year old male who presents to the emergency department because of possible exposure to rabies.  The patient states that 2 days ago his dogs or in a fight with a raccoon on. The raccoon bit the dogs. The patient states he handled the dogs, but was not bitten or scratched in any way. The patient has been in contact with a friend who had a similar incident and was told by the health department to be treated. The been no symptoms reported. The family is in the process of having the dogs observed at this time. They request to be treated for rabies exposure.      Past Medical History:  Diagnosis Date  . Anxiety   . COPD (chronic obstructive pulmonary disease) ([place])    AB clinical dx; HFA 75% 02/28/10 > 90% Sept 21, 2011  . Depression   . Diabetes mellitus   . Hyperlipidemia   . Weight gain    After quitting smoking in 2005    Patient Active Problem List   Diagnosis Date Noted  . HLD (hyperlipidemia) 06/26/2015  . 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 Vitamin D deficiency 09/20/2013  . THROAT CANCER 09/20/2013  . Anxiety   . TMJ click 93/81/0175  . Urinary retention with incomplete bladder emptying 10/31/2011  . Abnormal EKG 03/24/2011  . Rheumatoid arthritis(714.0) 04/08/2010  . Hypothyroidism 01/26/2010  . SLEEP APNEA 01/26/2010  . HEADACHE, CHRONIC 01/26/2010    Past Surgical History:  Procedure Laterality Date  . KNEE ARTHROSCOPY     right  . 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 Authorizing Provider  albuterol (PROVENTIL HFA;VENTOLIN HFA) 108 (90 Base) MCG/ACT inhaler  Inhale 2 puffs into the lungs every 6 (six) hours as needed for wheezing or shortness of breath. 01/24/17   Dettinger, Fransisca Kaufmann, MD  B-D INS SYRINGE 0.5CC/31GX5/16 31G X 5/16" 0.5 ML MISC USE TWICE DAILY TO ADMINISTER INSULIN 07/11/14   Wardell Honour, MD  budesonide-formoterol (SYMBICORT) 160-4.5 MCG/ACT inhaler INHALE 2 PUFFS INTO THE LUNGS 2 (TWO) TIMES DAILY. Patient taking differently: Inhale 2 puffs into the lungs. INHALE 2 PUFFS INTO THE LUNGS 2 (TWO) TIMES DAILy as needed 07/22/16   Dettinger, Fransisca Kaufmann, MD  Cholecalciferol (VITAMIN D3) 5000 units CAPS Take 5,000 Units by mouth.    [provider]  doxepin (SINEQUAN) 10 MG capsule 10 mg at bedtime.  04/05/16   [provider]  fenofibrate 160 MG tablet Take 1 tablet (160 mg total) by mouth daily. 07/22/16   Dettinger, Fransisca Kaufmann, MD  gabapentin (NEURONTIN) 300 MG capsule Take 3 capsules (900 mg total) by mouth 2 (two) times daily. Patient taking differently: Take 600 mg by mouth 4 (four) times daily.  08/14/14   Claretta Fraise, MD  glucose blood (ACCU-CHEK AVIVA) test strip Test blood sugar before meals and at bedtime 04/16/15   Claretta Fraise, MD  levothyroxine (SYNTHROID, LEVOTHROID) 175 MCG tablet Take 1 tablet (175 mcg total) by mouth daily before breakfast. 01/28/17   Dettinger, Fransisca Kaufmann, MD  morphine (KADIAN) 60  MG 24 hr capsule Take 60 mg by mouth daily.      [provider]  oxyCODONE-acetaminophen (PERCOCET) 7.5-325 MG per tablet Take 1 tablet by mouth every 6 (six) hours as needed.      [provider]  pantoprazole (PROTONIX) 20 MG tablet TAKE 1 TABLET (20 MG TOTAL) BY MOUTH DAILY. 11/29/16   Dettinger, Fransisca Kaufmann, MD  QUEtiapine (SEROQUEL) 100 MG tablet Take 200 mg by mouth at bedtime.  07/04/16   [provider]  rizatriptan (MAXALT) 10 MG tablet TAKE 1 TABLET (10 MG TOTAL) BY MOUTH AS NEEDED. MAY REPEAT IN 2 HOURS IF NEEDED 10/20/16   Dettinger, Fransisca Kaufmann, MD  rosuvastatin (CRESTOR) 10 MG tablet Take  1 tablet (10 mg total) by mouth daily. 07/22/16   Dettinger, Fransisca Kaufmann, MD  sertraline (ZOLOFT) 100 MG tablet Take 200 mg by mouth daily.  07/04/16   [provider]  tamsulosin (FLOMAX) 0.4 MG CAPS capsule Take 0.4 mg by mouth 2 (two) times daily.  07/04/16   [provider]  tiotropium (SPIRIVA HANDIHALER) 18 MCG inhalation capsule Place 1 capsule (18 mcg total) into inhaler and inhale daily. 01/24/17   Dettinger, Fransisca Kaufmann, MD  tiZANidine (ZANAFLEX) 2 MG tablet TAKE 2 TABLETS 4 TIMES A DAY AS NEEDED 04/30/16   [provider]  traZODone (DESYREL) 100 MG tablet Take 100 mg by mouth at bedtime.  04/20/13   [provider]  triamcinolone cream (KENALOG) 0.1 % Apply 1 application topically 2 (two) times daily. 01/24/17   Dettinger, Fransisca Kaufmann, MD    Family History Family History  Problem Relation Age of Onset  . Emphysema Sister   . Prostate cancer Maternal Grandfather   . Clotting disorder Maternal Grandfather   . Cancer Maternal Grandmother        brain  . Atopy Neg Hx     Social History Social History  Substance Use Topics  . Smoking status: Former Smoker    Packs/day: 3.00    Years: 30.00    Quit date: 07/20/2003  . Smokeless tobacco: Never Used  . Alcohol use 0.0 oz/week     Comment: daily     Allergies   Patient has no known allergies.   Review of Systems Review of Systems  Constitutional: Negative for activity change.       All ROS Neg except as noted in HPI  HENT: Negative for nosebleeds.   Eyes: Negative for photophobia and discharge.  Respiratory: Negative for cough, shortness of breath and wheezing.   Cardiovascular: Negative for chest pain and palpitations.  Gastrointestinal: Negative for abdominal pain and blood in stool.  Genitourinary: Negative for dysuria, frequency and hematuria.  Musculoskeletal: Negative for arthralgias, back pain and neck pain.  Skin: Negative.   Neurological: Negative for dizziness, seizures and speech  difficulty.  Psychiatric/Behavioral: Negative for confusion and hallucinations.     Physical Exam Updated Vital Signs BP 120/72 (BP Location: Right Arm)   Pulse 60   Temp (!) 97.5 F (36.4 C) (Oral)   Resp 18   Ht 6\' 2"  (1.88 m)   Wt 108 kg (238 lb)   SpO2 100%   BMI 30.56 kg/m   Physical Exam  Constitutional: He is oriented to person, place, and time. He appears well-developed and well-nourished.  Non-toxic appearance.  HENT:  Head: Normocephalic.  Right Ear: Tympanic membrane and external ear normal.  Left Ear: Tympanic membrane and external ear normal.  Eyes: Pupils are equal, round, and reactive  to light. EOM and lids are normal.  Neck: Normal range of motion. Neck supple. Carotid bruit is not present.  Tracheostomy in place  Cardiovascular: Normal rate, regular rhythm, normal heart sounds, intact distal pulses and normal pulses.   Pulmonary/Chest: Breath sounds normal. No respiratory distress.  Abdominal: Soft. Bowel sounds are normal. There is no tenderness. There is no guarding.  Musculoskeletal: Normal range of motion.  Lymphadenopathy:       Head (right side): No submandibular adenopathy present.       Head (left side): No submandibular adenopathy present.    He has no cervical adenopathy.  Neurological: He is alert and oriented to person, place, and time. He has normal strength. No cranial nerve deficit or sensory deficit.  Skin: Skin is warm and dry.  Psychiatric: He has a normal mood and affect. His speech is normal.  Nursing note and vitals reviewed.    ED Treatments / Results  Labs (all labs ordered are listed, but only abnormal results are displayed) Labs Reviewed - No data to display  EKG  EKG Interpretation None       Radiology No results found.  Procedures Procedures (including critical care time)  Medications Ordered in ED Medications  rabies vaccine (RABAVERT) injection 1 mL (not administered)  rabies immune globulin (HYPERAB/KEDRAB)  injection 2,175 Units (not administered)     Initial Impression / Assessment and Plan / ED Course  I have reviewed the triage vital signs and the nursing notes.  Pertinent labs & imaging results that were available during my care of the patient were reviewed by me and considered in my medical decision making (see chart for details).       Final Clinical Impressions(s) / ED Diagnoses MDM Vital signs are within normal limit.  Pt reports handling the dog that was bit by a racoon. Discussed with patient to have the dog observed by animal control. Pt is adamant about receiving vaccine. Rabies vaccine and immune globulin given to the patient.   Final diagnoses:  Contact with and (suspected) exposure to rabies    New Prescriptions New Prescriptions   No medications on file     Lily Kocher, Hershal Coria 02/07/17 0932    Milton Ferguson, MD 02/08/17 [phone]

## 2017-02-07 NOTE — ED Triage Notes (Signed)
Pt reports he was in contact with a raccoon on Saturday with high suspicion for rabies. States wants rabies vaccinations initiated.

## 2017-02-10 ENCOUNTER — Encounter (HOSPITAL_COMMUNITY)
Admission: RE | Admit: 2017-02-10 | Discharge: 2017-02-10 | Disposition: A | Discharge status: Home / Self Care | Payer: Medicare Other | Source: Ambulatory Visit | Attending: Emergency Medicine | Admitting: Emergency Medicine

## 2017-02-10 DIAGNOSIS — Z203 Contact with and (suspected) exposure to rabies: Secondary | ICD-10-CM | POA: Insufficient documentation

## 2017-02-10 MED ORDER — RABIES VACCINE, PCEC IM SUSR
1.0000 mL | Freq: Once | INTRAMUSCULAR | Status: AC
  Administered 2017-02-10: 1 mL via INTRAMUSCULAR
  Filled 2017-02-10: qty 1

## 2017-02-14 ENCOUNTER — Encounter (HOSPITAL_COMMUNITY)
Admission: RE | Admit: 2017-02-14 | Discharge: 2017-02-14 | Disposition: A | Discharge status: Home / Self Care | Payer: Medicare Other | Source: Ambulatory Visit | Attending: Emergency Medicine | Admitting: Emergency Medicine

## 2017-02-14 DIAGNOSIS — Z203 Contact with and (suspected) exposure to rabies: Secondary | ICD-10-CM | POA: Diagnosis not present

## 2017-02-14 MED ORDER — RABIES VACCINE, PCEC IM SUSR
INTRAMUSCULAR | Status: AC
  Filled 2017-02-14: qty 1

## 2017-02-14 MED ORDER — RABIES VACCINE, PCEC IM SUSR
1.0000 mL | Freq: Once | INTRAMUSCULAR | Status: AC
  Administered 2017-02-14: 1 mL via INTRAMUSCULAR

## 2017-02-21 ENCOUNTER — Encounter (HOSPITAL_COMMUNITY)
Admission: RE | Admit: 2017-02-21 | Discharge: 2017-02-21 | Disposition: A | Discharge status: Home / Self Care | Payer: Medicare Other | Source: Ambulatory Visit | Attending: Emergency Medicine | Admitting: Emergency Medicine

## 2017-02-21 DIAGNOSIS — Z203 Contact with and (suspected) exposure to rabies: Secondary | ICD-10-CM | POA: Diagnosis not present

## 2017-02-21 MED ORDER — RABIES VACCINE, PCEC IM SUSR
1.0000 mL | Freq: Once | INTRAMUSCULAR | Status: AC
  Administered 2017-02-21: 1 mL via INTRAMUSCULAR
  Filled 2017-02-21: qty 1

## 2017-02-23 DIAGNOSIS — K5900 Constipation, unspecified: Secondary | ICD-10-CM | POA: Diagnosis not present

## 2017-02-23 DIAGNOSIS — M47817 Spondylosis without myelopathy or radiculopathy, lumbosacral region: Secondary | ICD-10-CM | POA: Diagnosis not present

## 2017-02-23 DIAGNOSIS — G894 Chronic pain syndrome: Secondary | ICD-10-CM | POA: Diagnosis not present

## 2017-02-23 DIAGNOSIS — E1142 Type 2 diabetes mellitus with diabetic polyneuropathy: Secondary | ICD-10-CM | POA: Diagnosis not present

## 2017-02-24 ENCOUNTER — Other Ambulatory Visit: Payer: Self-pay | Admitting: Family Medicine

## 2017-02-24 DIAGNOSIS — K219 Gastro-esophageal reflux disease without esophagitis: Secondary | ICD-10-CM

## 2017-03-02 ENCOUNTER — Other Ambulatory Visit: Payer: Self-pay | Admitting: Family Medicine

## 2017-03-02 DIAGNOSIS — J439 Emphysema, unspecified: Secondary | ICD-10-CM

## 2017-03-03 MED ORDER — ALBUTEROL SULFATE HFA 108 (90 BASE) MCG/ACT IN AERS: 2.0000 | INHALATION_SPRAY | Freq: Four times a day (QID) | RESPIRATORY_TRACT | 11 refills | Status: DC | PRN

## 2017-03-10 IMAGING — CR DG CHEST 2V
2 series · 2 of 2 positions shown · non-contrast
Comparison: Chest x-ray of 09/16/2014

CLINICAL DATA: Short of breath for several months, chest and back
pain

EXAM:
CHEST  2 VIEW

[view not recorded (1 of 2)]
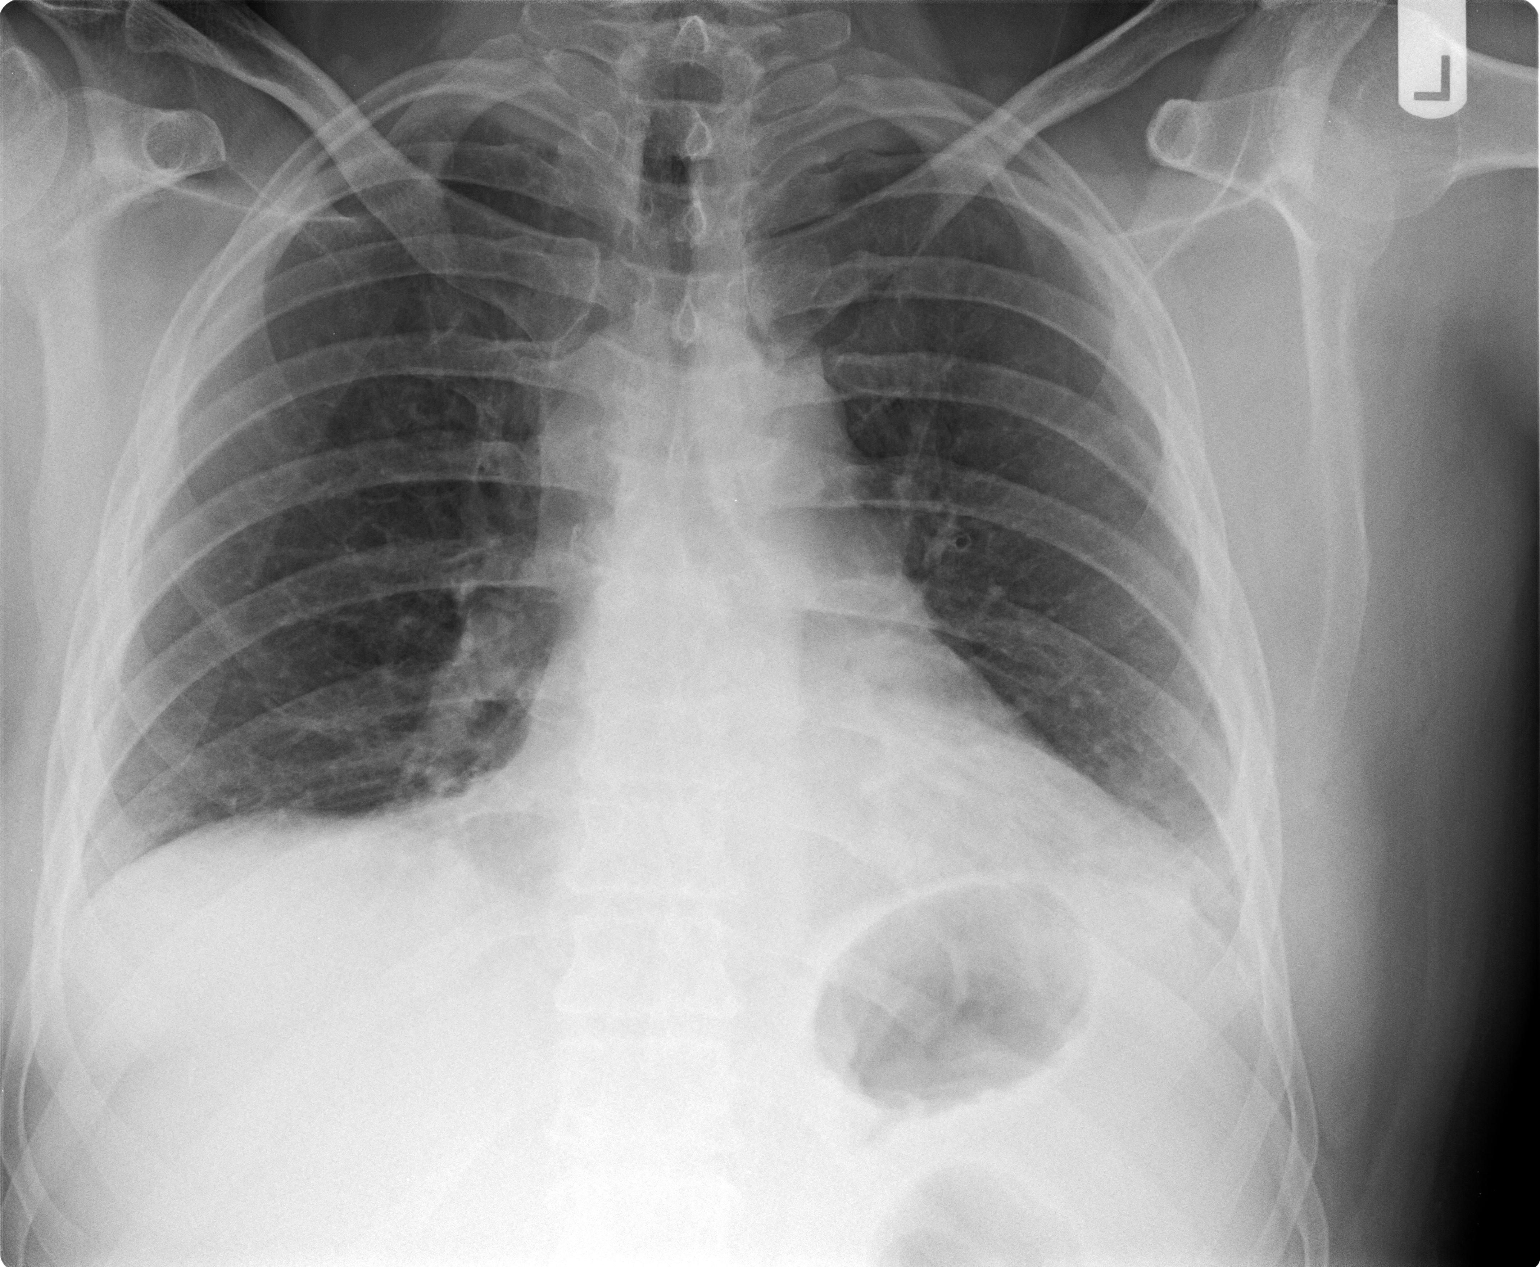

[view not recorded (2 of 2)]
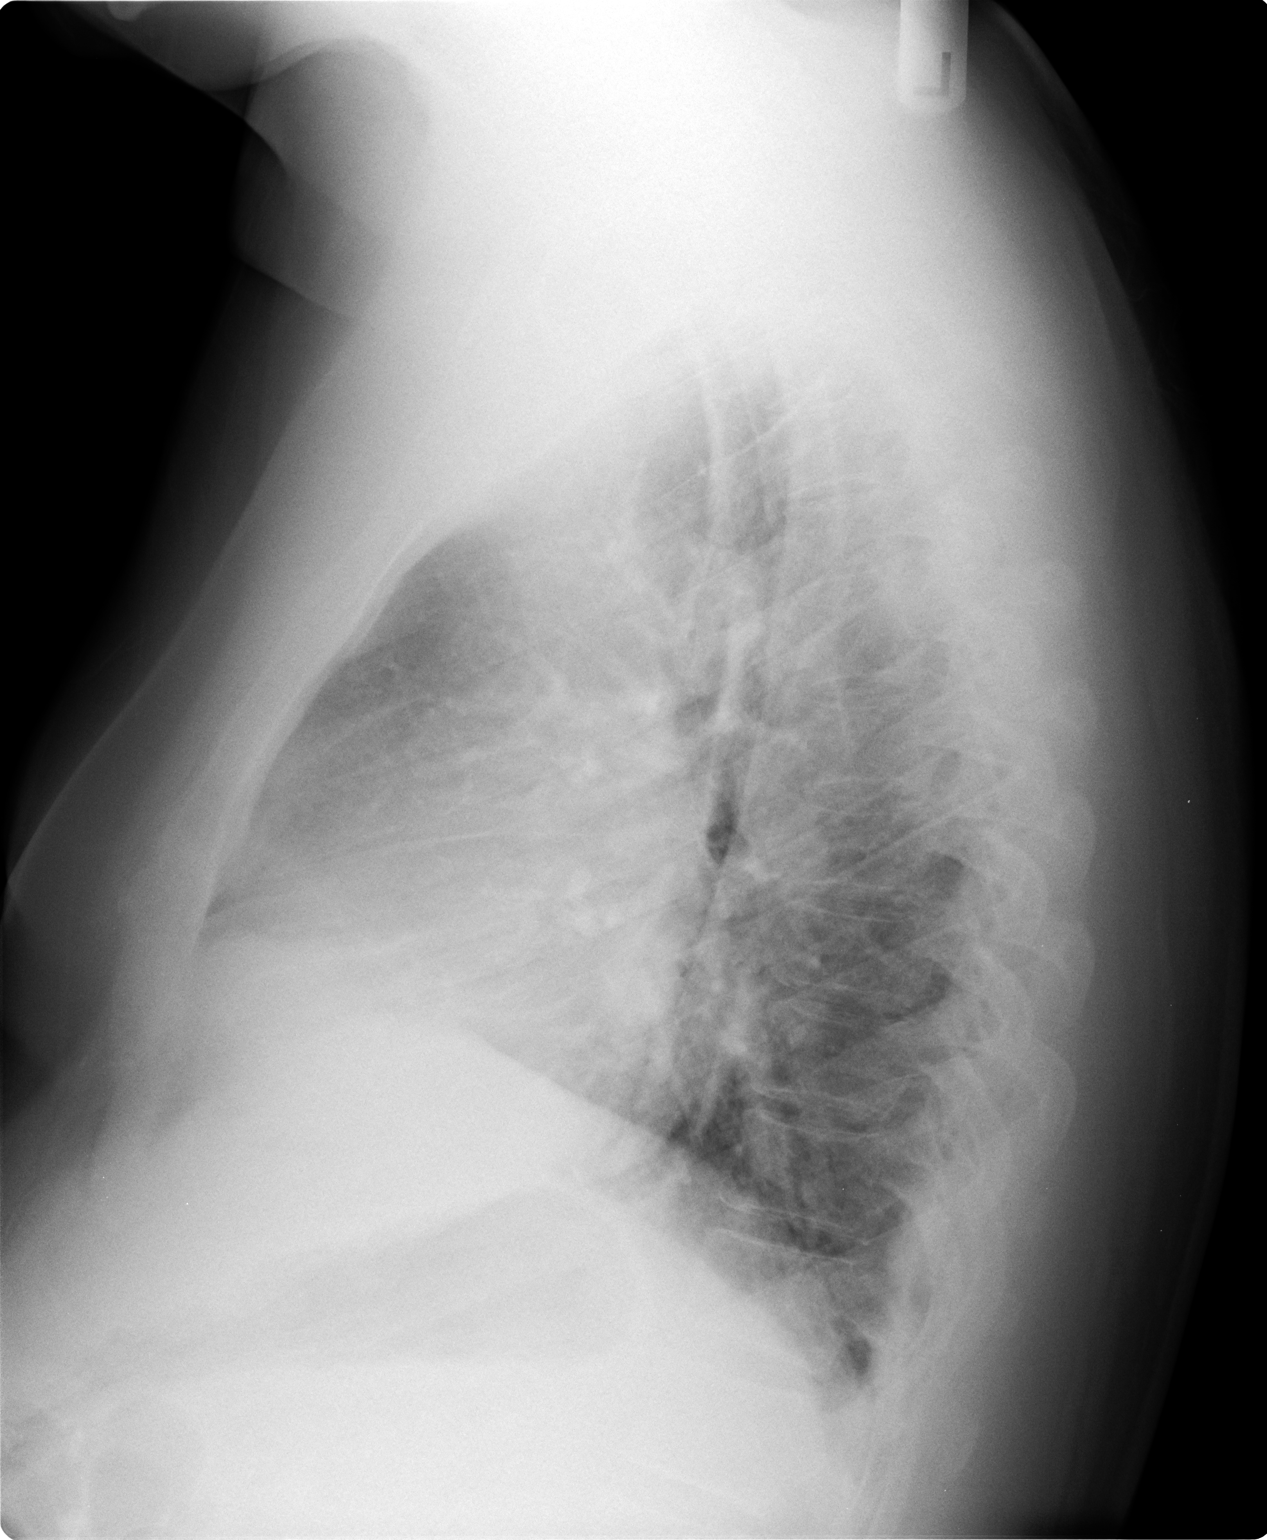

[2 of 2 positions shown; findings below may reference images not displayed]

FINDINGS: No focal infiltrate or effusion is seen. There is borderline
cardiomegaly present. Mediastinal and hilar contours are
unremarkable. The heart is within normal limits in size.
IMPRESSION: No active lung disease.  Borderline cardiomegaly.

## 2017-03-23 DIAGNOSIS — G894 Chronic pain syndrome: Secondary | ICD-10-CM | POA: Diagnosis not present

## 2017-03-23 DIAGNOSIS — E1142 Type 2 diabetes mellitus with diabetic polyneuropathy: Secondary | ICD-10-CM | POA: Diagnosis not present

## 2017-03-23 DIAGNOSIS — K5900 Constipation, unspecified: Secondary | ICD-10-CM | POA: Diagnosis not present

## 2017-03-23 DIAGNOSIS — M47817 Spondylosis without myelopathy or radiculopathy, lumbosacral region: Secondary | ICD-10-CM | POA: Diagnosis not present

## 2017-04-21 DIAGNOSIS — K5900 Constipation, unspecified: Secondary | ICD-10-CM | POA: Diagnosis not present

## 2017-04-21 DIAGNOSIS — M47817 Spondylosis without myelopathy or radiculopathy, lumbosacral region: Secondary | ICD-10-CM | POA: Diagnosis not present

## 2017-04-21 DIAGNOSIS — E1142 Type 2 diabetes mellitus with diabetic polyneuropathy: Secondary | ICD-10-CM | POA: Diagnosis not present

## 2017-04-21 DIAGNOSIS — G894 Chronic pain syndrome: Secondary | ICD-10-CM | POA: Diagnosis not present

## 2017-05-07 ENCOUNTER — Other Ambulatory Visit: Payer: Self-pay | Admitting: Family Medicine

## 2017-05-23 DIAGNOSIS — K5900 Constipation, unspecified: Secondary | ICD-10-CM | POA: Diagnosis not present

## 2017-05-23 DIAGNOSIS — M47817 Spondylosis without myelopathy or radiculopathy, lumbosacral region: Secondary | ICD-10-CM | POA: Diagnosis not present

## 2017-05-23 DIAGNOSIS — G894 Chronic pain syndrome: Secondary | ICD-10-CM | POA: Diagnosis not present

## 2017-05-23 DIAGNOSIS — E1142 Type 2 diabetes mellitus with diabetic polyneuropathy: Secondary | ICD-10-CM | POA: Diagnosis not present

## 2017-05-31 DIAGNOSIS — F329 Major depressive disorder, single episode, unspecified: Secondary | ICD-10-CM | POA: Diagnosis not present

## 2017-06-12 ENCOUNTER — Other Ambulatory Visit: Payer: Self-pay | Admitting: Family Medicine

## 2017-06-21 DIAGNOSIS — E1142 Type 2 diabetes mellitus with diabetic polyneuropathy: Secondary | ICD-10-CM | POA: Diagnosis not present

## 2017-06-21 DIAGNOSIS — M47817 Spondylosis without myelopathy or radiculopathy, lumbosacral region: Secondary | ICD-10-CM | POA: Diagnosis not present

## 2017-06-21 DIAGNOSIS — G894 Chronic pain syndrome: Secondary | ICD-10-CM | POA: Diagnosis not present

## 2017-06-21 DIAGNOSIS — K5900 Constipation, unspecified: Secondary | ICD-10-CM | POA: Diagnosis not present

## 2017-07-04 ENCOUNTER — Other Ambulatory Visit: Payer: Self-pay | Admitting: Family Medicine

## 2017-07-04 DIAGNOSIS — E782 Mixed hyperlipidemia: Secondary | ICD-10-CM

## 2017-07-15 DIAGNOSIS — Z794 Long term (current) use of insulin: Secondary | ICD-10-CM | POA: Diagnosis not present

## 2017-07-15 DIAGNOSIS — H2513 Age-related nuclear cataract, bilateral: Secondary | ICD-10-CM | POA: Diagnosis not present

## 2017-07-15 DIAGNOSIS — H04123 Dry eye syndrome of bilateral lacrimal glands: Secondary | ICD-10-CM | POA: Diagnosis not present

## 2017-07-15 DIAGNOSIS — E119 Type 2 diabetes mellitus without complications: Secondary | ICD-10-CM | POA: Diagnosis not present

## 2017-07-15 LAB — HM DIABETES EYE EXAM

## 2017-07-22 DIAGNOSIS — G894 Chronic pain syndrome: Secondary | ICD-10-CM | POA: Diagnosis not present

## 2017-07-22 DIAGNOSIS — E1142 Type 2 diabetes mellitus with diabetic polyneuropathy: Secondary | ICD-10-CM | POA: Diagnosis not present

## 2017-07-22 DIAGNOSIS — M47817 Spondylosis without myelopathy or radiculopathy, lumbosacral region: Secondary | ICD-10-CM | POA: Diagnosis not present

## 2017-07-22 DIAGNOSIS — K5900 Constipation, unspecified: Secondary | ICD-10-CM | POA: Diagnosis not present

## 2017-07-27 ENCOUNTER — Encounter: Payer: Self-pay | Admitting: Family Medicine

## 2017-07-27 ENCOUNTER — Ambulatory Visit (INDEPENDENT_AMBULATORY_CARE_PROVIDER_SITE_OTHER): Payer: Medicare Other | Admitting: Family Medicine

## 2017-07-27 VITALS — BP 109/69 | HR 62 | Temp 97.4°F | Ht 74.0 in | Wt 243.0 lb

## 2017-07-27 DIAGNOSIS — Z23 Encounter for immunization: Secondary | ICD-10-CM

## 2017-07-27 DIAGNOSIS — Z794 Long term (current) use of insulin: Secondary | ICD-10-CM | POA: Diagnosis not present

## 2017-07-27 DIAGNOSIS — E1169 Type 2 diabetes mellitus with other specified complication: Secondary | ICD-10-CM | POA: Diagnosis not present

## 2017-07-27 DIAGNOSIS — E0822 Diabetes mellitus due to underlying condition with diabetic chronic kidney disease: Secondary | ICD-10-CM | POA: Diagnosis not present

## 2017-07-27 DIAGNOSIS — E785 Hyperlipidemia, unspecified: Secondary | ICD-10-CM | POA: Diagnosis not present

## 2017-07-27 DIAGNOSIS — K219 Gastro-esophageal reflux disease without esophagitis: Secondary | ICD-10-CM

## 2017-07-27 DIAGNOSIS — E039 Hypothyroidism, unspecified: Secondary | ICD-10-CM | POA: Diagnosis not present

## 2017-07-27 DIAGNOSIS — N183 Chronic kidney disease, stage 3 (moderate): Secondary | ICD-10-CM

## 2017-07-27 DIAGNOSIS — J439 Emphysema, unspecified: Secondary | ICD-10-CM

## 2017-07-27 LAB — BAYER DCA HB A1C WAIVED: HB A1C (BAYER DCA - WAIVED): 6.2 % (ref <7.0)

## 2017-07-27 MED ORDER — FENOFIBRATE 160 MG PO TABS: 160.0000 mg | ORAL_TABLET | Freq: Every day | ORAL | 3 refills | Status: DC

## 2017-07-27 MED ORDER — ALBUTEROL SULFATE HFA 108 (90 BASE) MCG/ACT IN AERS: 2.0000 | INHALATION_SPRAY | Freq: Four times a day (QID) | RESPIRATORY_TRACT | 11 refills | Status: DC | PRN

## 2017-07-27 MED ORDER — ROSUVASTATIN CALCIUM 10 MG PO TABS: 10.0000 mg | ORAL_TABLET | Freq: Every day | ORAL | 0 refills | Status: DC

## 2017-07-27 MED ORDER — FLUTICASONE-UMECLIDIN-VILANT 100-62.5-25 MCG/INH IN AEPB: 1.0000 | INHALATION_SPRAY | Freq: Every day | RESPIRATORY_TRACT | 2 refills | Status: DC

## 2017-07-27 MED ORDER — LEVOTHYROXINE SODIUM 175 MCG PO TABS: 175.0000 ug | ORAL_TABLET | Freq: Every day | ORAL | 7 refills | Status: DC

## 2017-07-27 MED ORDER — RIZATRIPTAN BENZOATE 10 MG PO TABS: ORAL_TABLET | ORAL | 2 refills | Status: DC

## 2017-07-27 MED ORDER — PANTOPRAZOLE SODIUM 20 MG PO TBEC: 20.0000 mg | DELAYED_RELEASE_TABLET | Freq: Every day | ORAL | 1 refills | Status: DC

## 2017-07-27 NOTE — Progress Notes (Signed)
 BP 109/69   Pulse 62   Temp (!) 97.4 F (36.3 C) (Oral)   Ht 6' 2" (1.88 m)   Wt 243 lb (110.2 kg)   BMI 31.20 kg/m    Subjective:    Patient ID: Joseph [name], male    DOB: 07/10/1961, 56 y.o.   MRN: [id number]  HPI: Joseph [name] is a 56 y.o. male presenting on 07/27/2017 for Hypothyroidism (follow up); Hyperlipidemia; and Diabetes   HPI Hypothyroidism recheck Patient is coming in for thyroid recheck today as well. They deny any issues with hair changes or heat or cold problems or diarrhea or constipation. They deny any chest pain or palpitations. They are currently on levothyroxine 175 micrograms   Hyperlipidemia Patient is coming in for recheck of his hyperlipidemia. The patient is currently taking Crestor and fenofibrate. They deny any issues with myalgias or history of liver damage from it. They deny any focal numbness or weakness or chest pain.   Type 2 diabetes mellitus Patient comes in today for recheck of his diabetes. Patient has been currently taking diet control. Patient is not currently on an ACE inhibitor/ARB. Patient has not seen an ophthalmologist this year. Patient denies any issues with their feet.   GERD Patient is currently on Protonix.  She denies any major symptoms or abdominal pain or belching or burping. She denies any blood in her stool or lightheadedness or dizziness.   COPD recheck Patient is also coming in for COPD recheck.  He has been doing very well on the Spiriva and denies any major cough or wheezing spells.  He says the Spiriva has been doing very well for him.  He does use it every day and has not needed his albuterol rescue inhaler.  Relevant past medical, surgical, family and social history reviewed and updated as indicated. Interim medical history since our last visit reviewed. Allergies and medications reviewed and updated.  Review of Systems  Constitutional: Negative for chills and fever.  Respiratory: Negative for cough,  shortness of breath and wheezing.   Cardiovascular: Negative for chest pain and leg swelling.  Gastrointestinal: Negative for abdominal pain.  Genitourinary: Negative for decreased urine volume and difficulty urinating.  Musculoskeletal: Negative for back pain and gait problem.  Skin: Negative for rash.  Neurological: Negative for dizziness, weakness, light-headedness and headaches.  All other systems reviewed and are negative.   Per HPI unless specifically indicated above     Objective:    BP 109/69   Pulse 62   Temp (!) 97.4 F (36.3 C) (Oral)   Ht 6' 2" (1.88 m)   Wt 243 lb (110.2 kg)   BMI 31.20 kg/m   Wt Readings from Last 3 Encounters:  07/27/17 243 lb (110.2 kg)  02/07/17 238 lb (108 kg)  01/24/17 233 lb (105.7 kg)    Physical Exam  Constitutional: He is oriented to person, place, and time. He appears well-developed and well-nourished. No distress.  Eyes: Conjunctivae are normal. No scleral icterus.  Neck: Neck supple. No thyromegaly present.  Cardiovascular: Normal rate, regular rhythm, normal heart sounds and intact distal pulses.  No murmur heard. Pulmonary/Chest: Effort normal and breath sounds normal. No respiratory distress. He has no wheezes.  Abdominal: Soft. Bowel sounds are normal. He exhibits no distension. There is no tenderness. There is no rebound and no guarding.  Musculoskeletal: Normal range of motion. He exhibits no edema.  Lymphadenopathy:    He has no cervical adenopathy.  Neurological: He is alert and   oriented to person, place, and time. Coordination normal.  Skin: Skin is warm and dry. No rash noted. He is not diaphoretic.  Psychiatric: He has a normal mood and affect. His behavior is normal.  Nursing note and vitals reviewed.     Assessment & Plan:   Problem List Items Addressed This Visit      Endocrine   Hypothyroidism   Relevant Medications   levothyroxine (SYNTHROID, LEVOTHROID) 175 MCG tablet   Other Relevant Orders   TSH  (Completed)   Diabetes mellitus with chronic kidney disease ([facility])   Relevant Medications   rosuvastatin (CRESTOR) 10 MG tablet   Other Relevant Orders   Bayer DCA Hb A1c Waived (Completed)   CMP14+EGFR (Completed)   Hyperlipidemia associated with type 2 diabetes mellitus ([facility]) - Primary   Relevant Medications   fenofibrate 160 MG tablet   rosuvastatin (CRESTOR) 10 MG tablet   Other Relevant Orders   Lipid panel (Completed)    Other Visit Diagnoses    Pulmonary emphysema, unspecified emphysema type ([facility])       Relevant Medications   albuterol (PROVENTIL HFA;VENTOLIN HFA) 108 (90 Base) MCG/ACT inhaler   Fluticasone-Umeclidin-Vilant (TRELEGY ELLIPTA) 100-62.5-25 MCG/INH AEPB   Gastroesophageal reflux disease, esophagitis presence not specified       Relevant Medications   pantoprazole (PROTONIX) 20 MG tablet   Need for immunization against influenza       Relevant Orders   Flu Vaccine QUAD 36+ mos IM (Completed)       Follow up plan: Return in about 3 months (around 10/25/2017), or if symptoms worsen or fail to improve, for Recheck diabetes.  Counseling provided for all of the vaccine components Orders Placed This Encounter  Procedures  . Pneumococcal conjugate vaccine 13-valent    Joshua Dettinger, MD [facility] 07/27/2017, 1:53 PM     

## 2017-07-28 ENCOUNTER — Telehealth: Payer: Self-pay | Admitting: *Deleted

## 2017-07-28 LAB — CMP14+EGFR
ALBUMIN: 4.3 g/dL (ref 3.5–5.5)
ALK PHOS: 21 IU/L — AB (ref 39–117)
ALT: 23 IU/L (ref 0–44)
AST: 28 IU/L (ref 0–40)
Albumin/Globulin Ratio: 1.8 (ref 1.2–2.2)
BUN / CREAT RATIO: 15 (ref 9–20)
BUN: 22 mg/dL (ref 6–24)
Bilirubin Total: 0.3 mg/dL (ref 0.0–1.2)
CO2: 25 mmol/L (ref 20–29)
CREATININE: 1.45 mg/dL — AB (ref 0.76–1.27)
Calcium: 9.6 mg/dL (ref 8.7–10.2)
Chloride: 103 mmol/L (ref 96–106)
GFR calc non Af Amer: 53 mL/min/{1.73_m2} — ABNORMAL LOW (ref >59)
GFR, EST AFRICAN AMERICAN: 62 mL/min/{1.73_m2} (ref >59)
Globulin, Total: 2.4 g/dL (ref 1.5–4.5)
Glucose: 113 mg/dL — ABNORMAL HIGH (ref 65–99)
Potassium: 4.6 mmol/L (ref 3.5–5.2)
Sodium: 140 mmol/L (ref 134–144)
TOTAL PROTEIN: 6.7 g/dL (ref 6.0–8.5)

## 2017-07-28 LAB — TSH: TSH: 2.28 u[IU]/mL (ref 0.450–4.500)

## 2017-07-28 LAB — LIPID PANEL
CHOLESTEROL TOTAL: 130 mg/dL (ref 100–199)
Chol/HDL Ratio: 3 ratio (ref 0.0–5.0)
HDL: 43 mg/dL (ref >39)
LDL CALC: 48 mg/dL (ref 0–99)
Triglycerides: 194 mg/dL — ABNORMAL HIGH (ref 0–149)
VLDL Cholesterol Cal: 39 mg/dL (ref 5–40)

## 2017-07-28 NOTE — Telephone Encounter (Signed)
Please try for prior authorization, patient is already tried other things that were not affordable and if we can get this approved it could reduce his co-pay to 1 instead of 2 different medications.

## 2017-07-28 NOTE — Telephone Encounter (Signed)
Fax received Trelegy Ellipta non formlulary Suggestive alternatives are Bevespi, Stiolto or having a PA done Please advise

## 2017-08-22 DIAGNOSIS — Z79891 Long term (current) use of opiate analgesic: Secondary | ICD-10-CM | POA: Diagnosis not present

## 2017-08-22 DIAGNOSIS — E1142 Type 2 diabetes mellitus with diabetic polyneuropathy: Secondary | ICD-10-CM | POA: Diagnosis not present

## 2017-08-22 DIAGNOSIS — G894 Chronic pain syndrome: Secondary | ICD-10-CM | POA: Diagnosis not present

## 2017-08-22 DIAGNOSIS — M47817 Spondylosis without myelopathy or radiculopathy, lumbosacral region: Secondary | ICD-10-CM | POA: Diagnosis not present

## 2017-08-22 DIAGNOSIS — K5900 Constipation, unspecified: Secondary | ICD-10-CM | POA: Diagnosis not present

## 2017-09-01 DIAGNOSIS — G894 Chronic pain syndrome: Secondary | ICD-10-CM | POA: Diagnosis not present

## 2017-09-01 DIAGNOSIS — Z79891 Long term (current) use of opiate analgesic: Secondary | ICD-10-CM | POA: Diagnosis not present

## 2017-09-19 DIAGNOSIS — M47817 Spondylosis without myelopathy or radiculopathy, lumbosacral region: Secondary | ICD-10-CM | POA: Diagnosis not present

## 2017-09-19 DIAGNOSIS — E1142 Type 2 diabetes mellitus with diabetic polyneuropathy: Secondary | ICD-10-CM | POA: Diagnosis not present

## 2017-09-19 DIAGNOSIS — K5900 Constipation, unspecified: Secondary | ICD-10-CM | POA: Diagnosis not present

## 2017-09-19 DIAGNOSIS — G894 Chronic pain syndrome: Secondary | ICD-10-CM | POA: Diagnosis not present

## 2017-10-20 DIAGNOSIS — E1142 Type 2 diabetes mellitus with diabetic polyneuropathy: Secondary | ICD-10-CM | POA: Diagnosis not present

## 2017-10-20 DIAGNOSIS — M47817 Spondylosis without myelopathy or radiculopathy, lumbosacral region: Secondary | ICD-10-CM | POA: Diagnosis not present

## 2017-10-20 DIAGNOSIS — G894 Chronic pain syndrome: Secondary | ICD-10-CM | POA: Diagnosis not present

## 2017-10-20 DIAGNOSIS — K5900 Constipation, unspecified: Secondary | ICD-10-CM | POA: Diagnosis not present

## 2017-11-14 DIAGNOSIS — F329 Major depressive disorder, single episode, unspecified: Secondary | ICD-10-CM | POA: Diagnosis not present

## 2017-11-17 DIAGNOSIS — G894 Chronic pain syndrome: Secondary | ICD-10-CM | POA: Diagnosis not present

## 2017-11-17 DIAGNOSIS — M47817 Spondylosis without myelopathy or radiculopathy, lumbosacral region: Secondary | ICD-10-CM | POA: Diagnosis not present

## 2017-11-17 DIAGNOSIS — E1142 Type 2 diabetes mellitus with diabetic polyneuropathy: Secondary | ICD-10-CM | POA: Diagnosis not present

## 2017-11-17 DIAGNOSIS — K5900 Constipation, unspecified: Secondary | ICD-10-CM | POA: Diagnosis not present

## 2017-12-14 DIAGNOSIS — M47817 Spondylosis without myelopathy or radiculopathy, lumbosacral region: Secondary | ICD-10-CM | POA: Diagnosis not present

## 2017-12-14 DIAGNOSIS — G894 Chronic pain syndrome: Secondary | ICD-10-CM | POA: Diagnosis not present

## 2017-12-14 DIAGNOSIS — E1142 Type 2 diabetes mellitus with diabetic polyneuropathy: Secondary | ICD-10-CM | POA: Diagnosis not present

## 2017-12-14 DIAGNOSIS — K5900 Constipation, unspecified: Secondary | ICD-10-CM | POA: Diagnosis not present
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
## 2018-01-06 ENCOUNTER — Other Ambulatory Visit: Payer: Self-pay | Admitting: Family Medicine

## 2018-01-06 DIAGNOSIS — E1169 Type 2 diabetes mellitus with other specified complication: Secondary | ICD-10-CM

## 2018-01-06 DIAGNOSIS — E785 Hyperlipidemia, unspecified: Principal | ICD-10-CM

## 2018-01-09 ENCOUNTER — Ambulatory Visit (INDEPENDENT_AMBULATORY_CARE_PROVIDER_SITE_OTHER): Payer: Medicare Other | Admitting: Psychiatry

## 2018-01-09 ENCOUNTER — Encounter (HOSPITAL_COMMUNITY): Payer: Self-pay | Admitting: Psychiatry

## 2018-01-09 VITALS — BP 125/81 | HR 70 | Ht 74.0 in | Wt 237.0 lb

## 2018-01-09 DIAGNOSIS — F331 Major depressive disorder, recurrent, moderate: Secondary | ICD-10-CM | POA: Diagnosis not present

## 2018-01-09 MED ORDER — BUPROPION HCL ER (XL) 150 MG PO TB24: 150.0000 mg | ORAL_TABLET | Freq: Every day | ORAL | 0 refills | Status: DC

## 2018-01-09 NOTE — Patient Instructions (Signed)
1. Continue sertraline 150 mg daily  2. Add Wellbutrin 150 mg daily  3. Discontinue Trazodone 4. Continue doxepine 10 mg at night  5. Continue quetiapine 200 mg at night  6. Return to clinic in one month for 30 mins

## 2018-01-16 DIAGNOSIS — E1142 Type 2 diabetes mellitus with diabetic polyneuropathy: Secondary | ICD-10-CM | POA: Diagnosis not present

## 2018-01-16 DIAGNOSIS — K5900 Constipation, unspecified: Secondary | ICD-10-CM | POA: Diagnosis not present

## 2018-01-16 DIAGNOSIS — M47817 Spondylosis without myelopathy or radiculopathy, lumbosacral region: Secondary | ICD-10-CM | POA: Diagnosis not present

## 2018-01-16 DIAGNOSIS — G894 Chronic pain syndrome: Secondary | ICD-10-CM | POA: Diagnosis not present

## 2018-01-25 ENCOUNTER — Ambulatory Visit (INDEPENDENT_AMBULATORY_CARE_PROVIDER_SITE_OTHER): Payer: Medicare Other | Admitting: Family Medicine

## 2018-01-25 ENCOUNTER — Encounter: Payer: Self-pay | Admitting: Family Medicine

## 2018-01-25 VITALS — BP 124/77 | HR 77 | Temp 99.0°F | Ht 74.0 in | Wt 240.0 lb

## 2018-01-25 DIAGNOSIS — Z794 Long term (current) use of insulin: Secondary | ICD-10-CM

## 2018-01-25 DIAGNOSIS — E785 Hyperlipidemia, unspecified: Secondary | ICD-10-CM

## 2018-01-25 DIAGNOSIS — E039 Hypothyroidism, unspecified: Secondary | ICD-10-CM

## 2018-01-25 DIAGNOSIS — N183 Chronic kidney disease, stage 3 unspecified: Secondary | ICD-10-CM

## 2018-01-25 DIAGNOSIS — E0822 Diabetes mellitus due to underlying condition with diabetic chronic kidney disease: Secondary | ICD-10-CM | POA: Diagnosis not present

## 2018-01-25 DIAGNOSIS — E1169 Type 2 diabetes mellitus with other specified complication: Secondary | ICD-10-CM | POA: Diagnosis not present

## 2018-01-25 LAB — BAYER DCA HB A1C WAIVED: HB A1C (BAYER DCA - WAIVED): 6.2 % (ref <7.0)

## 2018-01-25 NOTE — Progress Notes (Signed)
BP 124/77   Pulse 77   Temp 99 F (37.2 C) (Oral)   Ht 6' 2"  (1.88 m)   Wt 240 lb (108.9 kg)   BMI 30.81 kg/m    Subjective:    Patient ID: Joseph [name], male    DOB: [birth year], 57 y.o.   MRN: [id number]  HPI: Joseph [name] is a 57 y.o. male presenting on 01/25/2018 for Hypothyroidism; Diabetes; and Hyperlipidemia   HPI Hypothyroidism recheck Patient is coming in for thyroid recheck today as well. They deny any issues with hair changes or heat or cold problems or diarrhea or constipation. They deny any chest pain or palpitations. They are currently on levothyroxine 164mcrograms   Hyperlipidemia Patient is coming in for recheck of his hyperlipidemia. The patient is currently taking Crestor. They deny any issues with myalgias or history of liver damage from it. They deny any focal numbness or weakness or chest pain.   Type 2 diabetes mellitus Patient comes in today for recheck of his diabetes. Patient has been currently taking diet controlled. Patient is not currently on an ACE inhibitor/ARB. Patient has not seen an ophthalmologist this year. Patient denies any issues with their feet.   Relevant past medical, surgical, family and social history reviewed and updated as indicated. Interim medical history since our last visit reviewed. Allergies and medications reviewed and updated.  Review of Systems  Constitutional: Negative for chills and fever.  Respiratory: Negative for shortness of breath and wheezing.   Cardiovascular: Negative for chest pain and leg swelling.  Musculoskeletal: Negative for back pain and gait problem.  Skin: Negative for rash.  Neurological: Negative for dizziness, weakness, light-headedness and numbness.  All other systems reviewed and are negative.   Per HPI unless specifically indicated above   Allergies as of 01/25/2018   No Known Allergies     Medication List        Accurate as of 01/25/18  1:57 PM. Always use your most recent med  list.          buPROPion 150 MG 24 hr tablet Commonly known as:  WELLBUTRIN XL Take 1 tablet (150 mg total) by mouth daily.   doxepin 10 MG capsule Commonly known as:  SINEQUAN 10 mg at bedtime.   Fluticasone-Umeclidin-Vilant 100-62.5-25 MCG/INH Aepb Commonly known as:  TRELEGY ELLIPTA Inhale 1 puff into the lungs daily.   levothyroxine 175 MCG tablet Commonly known as:  SYNTHROID, LEVOTHROID Take 1 tablet (175 mcg total) by mouth daily before breakfast.   pantoprazole 20 MG tablet Commonly known as:  PROTONIX Take 1 tablet (20 mg total) by mouth daily.   rosuvastatin 10 MG tablet Commonly known as:  CRESTOR Take 1 tablet (10 mg total) by mouth daily.   sertraline 100 MG tablet Commonly known as:  ZOLOFT Take 200 mg by mouth daily.   tiotropium 18 MCG inhalation capsule Commonly known as:  SPIRIVA HANDIHALER Place 1 capsule (18 mcg total) into inhaler and inhale daily.   Vitamin D3 5000 units Caps Take 5,000 Units by mouth.          Objective:    BP 124/77   Pulse 77   Temp 99 F (37.2 C) (Oral)   Ht 6' 2"  (1.88 m)   Wt 240 lb (108.9 kg)   BMI 30.81 kg/m   Wt Readings from Last 3 Encounters:  01/25/18 240 lb (108.9 kg)  07/27/17 243 lb (110.2 kg)  02/07/17 238 lb (108 kg)    Physical Exam  Constitutional: He is oriented to person, place, and time. He appears well-developed and well-nourished. No distress.  Eyes: Conjunctivae are normal. No scleral icterus.  Neck: Neck supple. No thyromegaly present.  Cardiovascular: Normal rate, regular rhythm, normal heart sounds and intact distal pulses.  No murmur heard. Pulmonary/Chest: Effort normal and breath sounds normal. No respiratory distress. He has no wheezes.  Musculoskeletal: Normal range of motion. He exhibits no edema.  Lymphadenopathy:    He has no cervical adenopathy.  Neurological: He is alert and oriented to person, place, and time. Coordination normal.  Skin: Skin is warm and dry. No rash  noted. He is not diaphoretic.  Psychiatric: He has a normal mood and affect. His behavior is normal.  Nursing note and vitals reviewed.   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  Follow up plan: Return in about 6 months (around 07/28/2018), or if symptoms worsen or fail to improve, for Thyroid and cholesterol recheck.  Counseling provided for all of the vaccine components No orders of the defined types were placed in this encounter.   Caryl Pina, MD [facility] 01/25/2018, 1:57 PM

## 2018-01-26 LAB — CBC WITH DIFFERENTIAL/PLATELET
BASOS: 1 %
Basophils Absolute: 0 10*3/uL (ref 0.0–0.2)
EOS (ABSOLUTE): 0.1 10*3/uL (ref 0.0–0.4)
Eos: 2 %
HEMOGLOBIN: 13.1 g/dL (ref 13.0–17.7)
Hematocrit: 39.1 % (ref 37.5–51.0)
IMMATURE GRANS (ABS): 0 10*3/uL (ref 0.0–0.1)
IMMATURE GRANULOCYTES: 0 %
LYMPHS: 25 %
Lymphocytes Absolute: 1.2 10*3/uL (ref 0.7–3.1)
MCH: 29.4 pg (ref 26.6–33.0)
MCHC: 33.5 g/dL (ref 31.5–35.7)
MCV: 88 fL (ref 79–97)
Monocytes Absolute: 0.5 10*3/uL (ref 0.1–0.9)
Monocytes: 10 %
NEUTROS PCT: 62 %
Neutrophils Absolute: 3 10*3/uL (ref 1.4–7.0)
Platelets: 190 10*3/uL (ref 150–450)
RBC: 4.46 x10E6/uL (ref 4.14–5.80)
RDW: 13.9 % (ref 12.3–15.4)
WBC: 4.8 10*3/uL (ref 3.4–10.8)

## 2018-01-26 LAB — CMP14+EGFR
ALT: 24 IU/L (ref 0–44)
AST: 28 IU/L (ref 0–40)
Albumin/Globulin Ratio: 2 (ref 1.2–2.2)
Albumin: 4.3 g/dL (ref 3.5–5.5)
Alkaline Phosphatase: 24 IU/L — ABNORMAL LOW (ref 39–117)
BUN/Creatinine Ratio: 14 (ref 9–20)
BUN: 17 mg/dL (ref 6–24)
Bilirubin Total: 0.3 mg/dL (ref 0.0–1.2)
CALCIUM: 9.6 mg/dL (ref 8.7–10.2)
CO2: 24 mmol/L (ref 20–29)
CREATININE: 1.21 mg/dL (ref 0.76–1.27)
Chloride: 104 mmol/L (ref 96–106)
GFR, EST AFRICAN AMERICAN: 76 mL/min/{1.73_m2} (ref >59)
GFR, EST NON AFRICAN AMERICAN: 66 mL/min/{1.73_m2} (ref >59)
GLOBULIN, TOTAL: 2.2 g/dL (ref 1.5–4.5)
Glucose: 95 mg/dL (ref 65–99)
Potassium: 4.7 mmol/L (ref 3.5–5.2)
Sodium: 143 mmol/L (ref 134–144)
TOTAL PROTEIN: 6.5 g/dL (ref 6.0–8.5)

## 2018-01-26 LAB — MICROALBUMIN / CREATININE URINE RATIO
Creatinine, Urine: 101.6 mg/dL
MICROALB/CREAT RATIO: 4 mg/g{creat} (ref 0.0–30.0)
MICROALBUM., U, RANDOM: 4.1 ug/mL

## 2018-01-26 LAB — TSH: TSH: 2.57 u[IU]/mL (ref 0.450–4.500)

## 2018-01-26 LAB — LIPID PANEL
CHOL/HDL RATIO: 2.8 ratio (ref 0.0–5.0)
Cholesterol, Total: 138 mg/dL (ref 100–199)
HDL: 49 mg/dL (ref >39)
LDL CALC: 55 mg/dL (ref 0–99)
Triglycerides: 172 mg/dL — ABNORMAL HIGH (ref 0–149)
VLDL CHOLESTEROL CAL: 34 mg/dL (ref 5–40)

## 2018-02-01 ENCOUNTER — Other Ambulatory Visit: Payer: Self-pay | Admitting: Family Medicine

## 2018-02-01 DIAGNOSIS — E782 Mixed hyperlipidemia: Secondary | ICD-10-CM

## 2018-02-02 ENCOUNTER — Other Ambulatory Visit (HOSPITAL_COMMUNITY): Payer: Self-pay | Admitting: Psychiatry

## 2018-02-02 MED ORDER — BUPROPION HCL ER (XL) 150 MG PO TB24: 150.0000 mg | ORAL_TABLET | Freq: Every day | ORAL | 0 refills | Status: DC
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
## 2018-02-13 DIAGNOSIS — N400 Enlarged prostate without lower urinary tract symptoms: Secondary | ICD-10-CM | POA: Diagnosis not present

## 2018-02-14 ENCOUNTER — Ambulatory Visit (INDEPENDENT_AMBULATORY_CARE_PROVIDER_SITE_OTHER): Payer: Medicare Other | Admitting: Psychiatry

## 2018-02-14 ENCOUNTER — Encounter (HOSPITAL_COMMUNITY): Payer: Self-pay | Admitting: Psychiatry

## 2018-02-14 VITALS — BP 120/74 | HR 76 | Ht 74.0 in | Wt 240.0 lb

## 2018-02-14 DIAGNOSIS — Z79899 Other long term (current) drug therapy: Secondary | ICD-10-CM

## 2018-02-14 DIAGNOSIS — F331 Major depressive disorder, recurrent, moderate: Secondary | ICD-10-CM

## 2018-02-14 MED ORDER — SERTRALINE HCL 100 MG PO TABS: 150.0000 mg | ORAL_TABLET | Freq: Every day | ORAL | 1 refills | Status: DC

## 2018-02-14 MED ORDER — QUETIAPINE FUMARATE 200 MG PO TABS: 200.0000 mg | ORAL_TABLET | Freq: Every day | ORAL | 0 refills | Status: DC

## 2018-02-14 MED ORDER — BUPROPION HCL ER (XL) 150 MG PO TB24: 150.0000 mg | ORAL_TABLET | Freq: Every day | ORAL | 1 refills | Status: DC

## 2018-02-14 MED ORDER — DOXEPIN HCL 10 MG PO CAPS: 10.0000 mg | ORAL_CAPSULE | Freq: Every day | ORAL | 0 refills | Status: DC

## 2018-02-14 NOTE — Patient Instructions (Signed)
1. Continue sertraline 150 mg daily  2. Continue Wellbutrin 150 mg daily  3. Continue doxepin 10 mg at night  4. Continue quetiapine 200 mg at night  5. Return to clinic in two months for 30 mins

## 2018-02-16 DIAGNOSIS — E1142 Type 2 diabetes mellitus with diabetic polyneuropathy: Secondary | ICD-10-CM | POA: Diagnosis not present

## 2018-02-16 DIAGNOSIS — K5900 Constipation, unspecified: Secondary | ICD-10-CM | POA: Diagnosis not present

## 2018-02-16 DIAGNOSIS — M47817 Spondylosis without myelopathy or radiculopathy, lumbosacral region: Secondary | ICD-10-CM | POA: Diagnosis not present

## 2018-02-16 DIAGNOSIS — G894 Chronic pain syndrome: Secondary | ICD-10-CM | POA: Diagnosis not present

## 2018-02-24 ENCOUNTER — Other Ambulatory Visit: Payer: Self-pay | Admitting: Family Medicine

## 2018-02-24 DIAGNOSIS — K219 Gastro-esophageal reflux disease without esophagitis: Secondary | ICD-10-CM

## 2018-03-07 ENCOUNTER — Ambulatory Visit (INDEPENDENT_AMBULATORY_CARE_PROVIDER_SITE_OTHER): Payer: Medicare Other | Admitting: *Deleted

## 2018-03-07 ENCOUNTER — Encounter: Payer: Self-pay | Admitting: *Deleted

## 2018-03-07 VITALS — BP 128/84 | HR 96 | Ht 74.0 in | Wt 245.0 lb

## 2018-03-07 DIAGNOSIS — Z0000 Encounter for general adult medical examination without abnormal findings: Secondary | ICD-10-CM | POA: Diagnosis not present

## 2018-03-07 DIAGNOSIS — Z1211 Encounter for screening for malignant neoplasm of colon: Secondary | ICD-10-CM

## 2018-03-07 NOTE — Progress Notes (Signed)
Subjective:   Joseph [name] is a 57 y.o. male who presents for Medicare Annual/Subsequent preventive examination.  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  He enjoys taking care of his 3 Orpha Bur Terrier's.  He lives with his wife and 3 dogs.    He has 2 step children and 2 step grandchildren.  He went out of work on disability in 2006 due to throat cancer.  Joseph [name] feels his health this year is about the same as it was last year.  He reports no hospitalizations, ER visits, or surgeries in the past year.   Review of Systems:  Musculoskeletal- back pain       Objective:    Vitals: BP 128/84   Pulse 96   Ht _0  (1.88 m)   Wt 245 lb (111.1 kg)   BMI 31.46 kg/m   Body mass index is 31.46 kg/m.  Advanced Directives 03/07/2018 02/07/2017 11/23/2016 05/14/2015 10/30/2011  Does Patient Have a Medical Advance Directive? Yes No Yes No;Yes Patient does not have advance directive  Type of Scientist, forensic Power of [place];Living will - [place];Living will [place];Living will -  Does patient want to make changes to medical advance directive? No - Patient declined - No - Patient declined No - Patient declined -  Copy of [place] in Chart? No - copy requested - No - copy requested - -  Would patient like information on creating a medical advance directive? - No - Patient declined - No - patient declined information -  Pre-existing out of facility DNR order (yellow form or pink MOST form) - - - - No    Tobacco Social History   Tobacco Use  Smoking Status Former Smoker  . Packs/day: 3.00  . Years: 30.00  . Pack years: 90.00  . Last attempt to quit: 07/20/2003  . Years since quitting: 14.6  Smokeless Tobacco Never Used     Counseling given: No   Clinical Intake:     Pain Score: 7   Chronic back pain                 Past Medical History:  Diagnosis Date  .  Anxiety   . COPD (chronic obstructive pulmonary disease) ([place])    AB clinical dx; HFA 75% 02/28/10 > 90% Sept 21, 2011  . Depression   . Diabetes mellitus   . Hyperlipidemia   . Weight gain    After quitting smoking in 2005   Past Surgical History:  Procedure Laterality Date  . KNEE ARTHROSCOPY     right  . 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 Emphysema Sister   . Prostate cancer Maternal Grandfather   . Clotting disorder Maternal Grandfather   . Cancer Maternal Grandmother        brain  . Depression Maternal Uncle   . Atopy Neg Hx    Social History   Socioeconomic History  . Marital status: Married    Spouse name: Not on file  . Number of children: Not on file  . Years of education: Not on file  . Highest education level: Not on file  Occupational History  . Not on file  Social Needs  . Financial resource strain: Not on file  . Food insecurity:    Worry: Not on file    Inability:  Not on file  . Transportation needs:    Medical: Not on file    Non-medical: Not on file  Tobacco Use  . Smoking status: Former Smoker    Packs/day: 3.00    Years: 30.00    Pack years: 90.00    Last attempt to quit: 07/20/2003    Years since quitting: 14.6  . Smokeless tobacco: Never Used  Substance and Sexual Activity  . Alcohol use: Yes    Alcohol/week: 4.0 standard drinks    Types: 4 Cans of beer per week    Comment: per week  . Drug use: No  . Sexual activity: Not on file  Lifestyle  . Physical activity:    Days per week: Not on file    Minutes per session: Not on file  . Stress: Not on file  Relationships  . Social connections:    Talks on phone: Not on file    Gets together: Not on file    Attends religious service: Not on file    Active member of club or organization: Not on file    Attends meetings of clubs or organizations: Not on file    Relationship status: Not on file  Other Topics Concern    . Not on file  Social History Narrative   Married with children    Outpatient Encounter Medications as of 03/07/2018  Medication Sig  . buPROPion (WELLBUTRIN XL) 150 MG 24 hr tablet Take 1 tablet (150 mg total) by mouth daily.  . Cholecalciferol (VITAMIN D3) 5000 units CAPS Take 5,000 Units by mouth.  . doxepin (SINEQUAN) 10 MG capsule Take 1 capsule (10 mg total) by mouth at bedtime.  . Fluticasone-Umeclidin-Vilant (TRELEGY ELLIPTA) 100-62.5-25 MCG/INH AEPB Inhale 1 puff into the lungs daily.  Marland Kitchen levothyroxine (SYNTHROID, LEVOTHROID) 175 MCG tablet Take 1 tablet (175 mcg total) by mouth daily before breakfast.  . pantoprazole (PROTONIX) 20 MG tablet TAKE 1 TABLET BY MOUTH EVERY DAY  . QUEtiapine (SEROQUEL) 200 MG tablet Take 1 tablet (200 mg total) by mouth at bedtime.  . rosuvastatin (CRESTOR) 10 MG tablet TAKE 1 TABLET (10 MG TOTAL) BY MOUTH DAILY.  Marland Kitchen sertraline (ZOLOFT) 100 MG tablet Take 1.5 tablets (150 mg total) by mouth daily.  Marland Kitchen tiotropium (SPIRIVA HANDIHALER) 18 MCG inhalation capsule Place 1 capsule (18 mcg total) into inhaler and inhale daily.   No facility-administered encounter medications on file as of 03/07/2018.     Activities of Daily Living In your present state of health, do you have any difficulty performing the following activities: 03/07/2018  Hearing? Y  Comment [place] - causes problems hearing  Vision? N  Difficulty concentrating or making decisions? N  Walking or climbing stairs? N  Dressing or bathing? N  Doing errands, shopping? N  Preparing Food and eating ? N  Using the Toilet? N  In the past six months, have you accidently leaked urine? N  Do you have problems with loss of bowel control? N  Managing your Medications? N  Managing your Finances? N  Housekeeping or managing your Housekeeping? N  Some recent data might be hidden    Patient Care Team: Dettinger, Fransisca Kaufmann, MD as PCP - General (Family Medicine) Margaretha Sheffield, MD as Referring  Physician (Physical Medicine and Rehabilitation) Services, [facility] as Referring Physician (Psychiatry) Festus Aloe, MD as Consulting Physician (Urology)   Assessment:   This is a routine wellness examination for [place].  Exercise Activities and Dietary recommendations  Patient states he usually eats  3 meals per day without snacking in between.  He watches his carbohydrate intake to help control his blood sugar.  Recommended diet of mostly lean proteins, vegetables, and whole grains and fruits in moderation.  Current Exercise Habits: Home exercise routine, Type of exercise: walking, Time (Minutes): 60, Frequency (Times/Week): 7, Weekly Exercise (Minutes/Week): 420, Intensity: Moderate, Exercise limited by: orthopedic condition(s)   Goals    . Exercise 3x per week (30 min per time)    . Have 3 meals a day     Joseph [name] has chosen to continue to work on goals he set at last year's AWV listed above.   Fall Risk Fall Risk  03/07/2018 01/24/2017 11/23/2016 04/14/2016 10/24/2015  Falls in the past year? _0   Number falls in past yr: - - - - -  Injury with Fall? - - - - -   Is the patient's home free of loose throw rugs in walkways, pet beds, electrical cords, etc?   yes      Grab bars in the bathroom? no      Handrails on the stairs?   yes      Adequate lighting?   yes    Depression Screen PHQ 2/9 Scores 03/07/2018 01/25/2018 07/27/2017 01/24/2017  PHQ - 2 Score 0 0 0 1  PHQ- 9 Score - - - -    Cognitive Function MMSE - Mini Mental State Exam 03/07/2018 11/23/2016  Orientation to time 5 5  Orientation to Place 4 5  Registration 3 3  Attention/ Calculation 5 5  Recall 3 3  Language- name 2 objects 2 2  Language- repeat 1 1  Language- follow 3 step command 3 3  Language- read & follow direction 1 1  Write a sentence 1 1  Copy design 1 1  Total score 29 30        Immunization History  Administered Date(s) Administered  . Influenza,inj,Quad PF,6+ Mos  06/12/2013, 04/14/2016, 07/27/2017  . Influenza-Unspecified 05/22/2015  . Pneumococcal Conjugate-13 07/27/2017  . Pneumococcal-Unspecified 04/28/2012  . Rabies, IM 02/07/2017, 02/10/2017, 02/14/2017, 02/21/2017  . Tdap 12/17/2011    Qualifies for Shingles Vaccine? Declined today  Screening Tests Health Maintenance  Topic Date Due  . PNEUMOCOCCAL POLYSACCHARIDE VACCINE AGE 16-64 HIGH RISK  09/05/1962  . COLON CANCER SCREENING ANNUAL FOBT  09/05/2010  . INFLUENZA VACCINE  02/16/2018  . COLONOSCOPY  03/08/2019 (Originally 09/05/2010)  . OPHTHALMOLOGY EXAM  07/15/2018  . HEMOGLOBIN A1C  07/28/2018  . FOOT EXAM  01/26/2019  . URINE MICROALBUMIN  01/26/2019  . TETANUS/TDAP  12/16/2021  . Hepatitis C Screening  Completed  . HIV Screening  Completed   Colonoscopy referral declined by patient today.  Patient agreeable to doing FOBT.  FOBT kit given along with instructions and advised patient to return to lab at [facility] once completed.   Cancer Screenings: Lung: Low Dose CT Chest recommended if Age 29-80 years, 30 pack-year currently smoking OR have quit w/in 15years. Patient does qualify. Colorectal: colonoscopy declined, patient plans to do FOBT.  Additional Screenings:  Hepatitis C Screening: completed 04/14/2016      Plan:     Continue to work on goals of exercising 3 times per week for at least 30 minutes- walking is a great option, and having 3 balanced and healthy meals per day.  Bring a copy of Advance Directives to our office to be filed in your medical record.  Complete your stool test and return to the lab at  our office.    I have personally reviewed and noted the following in the patient's chart:   . Medical and social history . Use of alcohol, tobacco or illicit drugs  . Current medications and supplements . Functional ability and status . Nutritional status . Physical activity . Advanced directives . List of other physicians . Hospitalizations, surgeries, and ER  visits in previous 12 months . Vitals . Screenings to include cognitive, depression, and falls . Referrals and appointments  In addition, I have reviewed and discussed with patient certain preventive protocols, quality metrics, and best practice recommendations. A written personalized care plan for preventive services as well as general preventive health recommendations were provided to patient.     Denyce Robert, RN  03/07/2018

## 2018-03-07 NOTE — Patient Instructions (Signed)
Please continue to work on your goals of exercising 3 times per week for at least 30 minutes- walking is a great option, and having 3 balanced and healthy meals per day.   Please bring a copy of your Advance Directives to our office to be filed in your medical record.   Please complete your stool test and return to the lab at our office.   Thank you for coming in for your Annual Wellness Visit today!   Preventive Care 40-64 Years, Male Preventive care refers to lifestyle choices and visits with your health care provider that can promote health and wellness. What does preventive care include?  A yearly physical exam. This is also called an annual well check.  Dental exams once or twice a year.  Routine eye exams. Ask your health care provider how often you should have your eyes checked.  Personal lifestyle choices, including: ? Daily care of your teeth and gums. ? Regular physical activity. ? Eating a healthy diet. ? Avoiding tobacco and drug use. ? Limiting alcohol use. ? Practicing safe sex. ? Taking low-dose aspirin every day starting at age 68. What happens during an annual well check? The services and screenings done by your health care provider during your annual well check will depend on your age, overall health, lifestyle risk factors, and family history of disease. Counseling Your health care provider may ask you questions about your:  Alcohol use.  Tobacco use.  Drug use.  Emotional well-being.  Home and relationship well-being.  Sexual activity.  Eating habits.  Work and work Statistician.  Screening You may have the following tests or measurements:  Height, weight, and BMI.  Blood pressure.  Lipid and cholesterol levels. These may be checked every 5 years, or more frequently if you are over 49 years old.  Skin check.  Lung cancer screening. You may have this screening every year starting at age 21 if you have a 30-pack-year history of smoking and  currently smoke or have quit within the past 15 years.  Fecal occult blood test (FOBT) of the stool. You may have this test every year starting at age 59.  Flexible sigmoidoscopy or colonoscopy. You may have a sigmoidoscopy every 5 years or a colonoscopy every 10 years starting at age 59.  Prostate cancer screening. Recommendations will vary depending on your family history and other risks.  Hepatitis C blood test.  Hepatitis B blood test.  Sexually transmitted disease (STD) testing.  Diabetes screening. This is done by checking your blood sugar (glucose) after you have not eaten for a while (fasting). You may have this done every 1-3 years.  Discuss your test results, treatment options, and if necessary, the need for more tests with your health care provider. Vaccines Your health care provider may recommend certain vaccines, such as:  Influenza vaccine. This is recommended every year.  Tetanus, diphtheria, and acellular pertussis (Tdap, Td) vaccine. You may need a Td booster every 10 years.  Varicella vaccine. You may need this if you have not been vaccinated.  Zoster vaccine. You may need this after age 50.  Measles, mumps, and rubella (MMR) vaccine. You may need at least one dose of MMR if you were born in 1957 or later. You may also need a second dose.  Pneumococcal 13-valent conjugate (PCV13) vaccine. You may need this if you have certain conditions and have not been vaccinated.  Pneumococcal polysaccharide (PPSV23) vaccine. You may need one or two doses if you smoke cigarettes or  if you have certain conditions.  Meningococcal vaccine. You may need this if you have certain conditions.  Hepatitis A vaccine. You may need this if you have certain conditions or if you travel or work in places where you may be exposed to hepatitis A.  Hepatitis B vaccine. You may need this if you have certain conditions or if you travel or work in places where you may be exposed to hepatitis  B.  Haemophilus influenzae type b (Hib) vaccine. You may need this if you have certain risk factors.  Talk to your health care provider about which screenings and vaccines you need and how often you need them. This information is not intended to replace advice given to you by your health care provider. Make sure you discuss any questions you have with your health care provider. Document Released: 08/01/2015 Document Revised: 03/24/2016 Document Reviewed: 05/06/2015 Elsevier Interactive Patient Education  [employer].

## 2018-03-13 DIAGNOSIS — G894 Chronic pain syndrome: Secondary | ICD-10-CM | POA: Diagnosis not present

## 2018-03-13 DIAGNOSIS — K5900 Constipation, unspecified: Secondary | ICD-10-CM | POA: Diagnosis not present

## 2018-03-13 DIAGNOSIS — E1142 Type 2 diabetes mellitus with diabetic polyneuropathy: Secondary | ICD-10-CM | POA: Diagnosis not present

## 2018-03-13 DIAGNOSIS — M47817 Spondylosis without myelopathy or radiculopathy, lumbosacral region: Secondary | ICD-10-CM | POA: Diagnosis not present
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
## 2018-04-18 ENCOUNTER — Ambulatory Visit (HOSPITAL_COMMUNITY): Payer: Self-pay | Admitting: Psychiatry

## 2018-04-18 ENCOUNTER — Other Ambulatory Visit (HOSPITAL_COMMUNITY): Payer: Self-pay | Admitting: Psychiatry

## 2018-04-18 DIAGNOSIS — Z79891 Long term (current) use of opiate analgesic: Secondary | ICD-10-CM | POA: Diagnosis not present

## 2018-04-18 DIAGNOSIS — M47817 Spondylosis without myelopathy or radiculopathy, lumbosacral region: Secondary | ICD-10-CM | POA: Diagnosis not present

## 2018-04-18 DIAGNOSIS — E1142 Type 2 diabetes mellitus with diabetic polyneuropathy: Secondary | ICD-10-CM | POA: Diagnosis not present

## 2018-04-18 DIAGNOSIS — K5900 Constipation, unspecified: Secondary | ICD-10-CM | POA: Diagnosis not present

## 2018-04-18 DIAGNOSIS — G894 Chronic pain syndrome: Secondary | ICD-10-CM | POA: Diagnosis not present

## 2018-04-18 MED ORDER — BUPROPION HCL ER (XL) 150 MG PO TB24: 150.0000 mg | ORAL_TABLET | Freq: Every day | ORAL | 0 refills | Status: DC

## 2018-04-19 DIAGNOSIS — M47817 Spondylosis without myelopathy or radiculopathy, lumbosacral region: Secondary | ICD-10-CM | POA: Diagnosis not present

## 2018-05-16 ENCOUNTER — Ambulatory Visit (HOSPITAL_COMMUNITY): Payer: Medicare Other | Admitting: Psychiatry

## 2018-05-16 DIAGNOSIS — M47817 Spondylosis without myelopathy or radiculopathy, lumbosacral region: Secondary | ICD-10-CM | POA: Diagnosis not present

## 2018-05-16 DIAGNOSIS — E1142 Type 2 diabetes mellitus with diabetic polyneuropathy: Secondary | ICD-10-CM | POA: Diagnosis not present

## 2018-05-16 DIAGNOSIS — G894 Chronic pain syndrome: Secondary | ICD-10-CM | POA: Diagnosis not present

## 2018-05-16 DIAGNOSIS — K5900 Constipation, unspecified: Secondary | ICD-10-CM | POA: Diagnosis not present

## 2018-05-16 DIAGNOSIS — M7551 Bursitis of right shoulder: Secondary | ICD-10-CM | POA: Diagnosis not present

## 2018-05-17 ENCOUNTER — Other Ambulatory Visit (HOSPITAL_COMMUNITY): Payer: Self-pay | Admitting: Psychiatry

## 2018-05-17 MED ORDER — DOXEPIN HCL 10 MG PO CAPS: 10.0000 mg | ORAL_CAPSULE | Freq: Every day | ORAL | 0 refills | Status: DC
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
## 2018-05-29 ENCOUNTER — Ambulatory Visit (HOSPITAL_COMMUNITY): Payer: Medicare Other | Admitting: Psychiatry

## 2018-06-02 ENCOUNTER — Other Ambulatory Visit: Payer: Self-pay | Admitting: *Deleted

## 2018-06-02 DIAGNOSIS — K219 Gastro-esophageal reflux disease without esophagitis: Secondary | ICD-10-CM

## 2018-06-02 MED ORDER — PANTOPRAZOLE SODIUM 20 MG PO TBEC: 20.0000 mg | DELAYED_RELEASE_TABLET | Freq: Every day | ORAL | 0 refills | Status: DC

## 2018-06-08 ENCOUNTER — Ambulatory Visit (INDEPENDENT_AMBULATORY_CARE_PROVIDER_SITE_OTHER): Payer: Medicare Other

## 2018-06-08 DIAGNOSIS — Z23 Encounter for immunization: Secondary | ICD-10-CM

## 2018-06-14 DIAGNOSIS — G894 Chronic pain syndrome: Secondary | ICD-10-CM | POA: Diagnosis not present

## 2018-06-14 DIAGNOSIS — M47817 Spondylosis without myelopathy or radiculopathy, lumbosacral region: Secondary | ICD-10-CM | POA: Diagnosis not present

## 2018-06-14 DIAGNOSIS — K5900 Constipation, unspecified: Secondary | ICD-10-CM | POA: Diagnosis not present

## 2018-06-14 DIAGNOSIS — E1142 Type 2 diabetes mellitus with diabetic polyneuropathy: Secondary | ICD-10-CM | POA: Diagnosis not present
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
## 2018-06-27 ENCOUNTER — Ambulatory Visit (INDEPENDENT_AMBULATORY_CARE_PROVIDER_SITE_OTHER): Payer: Medicare Other | Admitting: Psychiatry

## 2018-06-27 ENCOUNTER — Encounter (HOSPITAL_COMMUNITY): Payer: Self-pay | Admitting: Psychiatry

## 2018-06-27 VITALS — BP 168/96 | HR 100 | Ht 74.0 in | Wt 249.0 lb

## 2018-06-27 DIAGNOSIS — F331 Major depressive disorder, recurrent, moderate: Secondary | ICD-10-CM | POA: Diagnosis not present

## 2018-06-27 MED ORDER — BUPROPION HCL ER (XL) 300 MG PO TB24: 300.0000 mg | ORAL_TABLET | Freq: Every day | ORAL | 0 refills | Status: DC

## 2018-06-27 MED ORDER — QUETIAPINE FUMARATE 200 MG PO TABS: 200.0000 mg | ORAL_TABLET | Freq: Every day | ORAL | 0 refills | Status: DC

## 2018-06-27 MED ORDER — SERTRALINE HCL 100 MG PO TABS: 150.0000 mg | ORAL_TABLET | Freq: Every day | ORAL | 0 refills | Status: DC

## 2018-06-27 MED ORDER — DOXEPIN HCL 10 MG PO CAPS: 10.0000 mg | ORAL_CAPSULE | Freq: Every day | ORAL | 0 refills | Status: DC

## 2018-06-27 NOTE — Patient Instructions (Addendum)
1. Continue sertraline 150 mg daily 2. Increase Wellbutrin 300 mg at night 3. Continue doxepin 10 mg at night  4. Continue quetiapine 200 mg at night  5.Return to clinic in two months for 30 mins

## 2018-07-11 DIAGNOSIS — M47817 Spondylosis without myelopathy or radiculopathy, lumbosacral region: Secondary | ICD-10-CM | POA: Diagnosis not present

## 2018-07-11 DIAGNOSIS — K5900 Constipation, unspecified: Secondary | ICD-10-CM | POA: Diagnosis not present

## 2018-07-11 DIAGNOSIS — E1142 Type 2 diabetes mellitus with diabetic polyneuropathy: Secondary | ICD-10-CM | POA: Diagnosis not present

## 2018-07-11 DIAGNOSIS — G894 Chronic pain syndrome: Secondary | ICD-10-CM | POA: Diagnosis not present

## 2018-07-24 ENCOUNTER — Other Ambulatory Visit (HOSPITAL_COMMUNITY): Payer: Self-pay | Admitting: Psychiatry

## 2018-07-24 MED ORDER — BUPROPION HCL ER (XL) 300 MG PO TB24: 300.0000 mg | ORAL_TABLET | Freq: Every day | ORAL | 0 refills | Status: DC

## 2018-07-31 ENCOUNTER — Ambulatory Visit (INDEPENDENT_AMBULATORY_CARE_PROVIDER_SITE_OTHER): Payer: Medicare Other | Admitting: Family Medicine

## 2018-07-31 ENCOUNTER — Encounter: Payer: Self-pay | Admitting: Family Medicine

## 2018-07-31 VITALS — BP 130/78 | HR 73 | Temp 99.2°F | Ht 74.0 in | Wt 252.8 lb

## 2018-07-31 DIAGNOSIS — F419 Anxiety disorder, unspecified: Secondary | ICD-10-CM | POA: Diagnosis not present

## 2018-07-31 DIAGNOSIS — Z23 Encounter for immunization: Secondary | ICD-10-CM

## 2018-07-31 DIAGNOSIS — E785 Hyperlipidemia, unspecified: Secondary | ICD-10-CM

## 2018-07-31 DIAGNOSIS — K219 Gastro-esophageal reflux disease without esophagitis: Secondary | ICD-10-CM

## 2018-07-31 DIAGNOSIS — E782 Mixed hyperlipidemia: Secondary | ICD-10-CM | POA: Diagnosis not present

## 2018-07-31 DIAGNOSIS — E039 Hypothyroidism, unspecified: Secondary | ICD-10-CM | POA: Diagnosis not present

## 2018-07-31 DIAGNOSIS — E1169 Type 2 diabetes mellitus with other specified complication: Secondary | ICD-10-CM

## 2018-07-31 DIAGNOSIS — E0822 Diabetes mellitus due to underlying condition with diabetic chronic kidney disease: Secondary | ICD-10-CM | POA: Diagnosis not present

## 2018-07-31 DIAGNOSIS — Z794 Long term (current) use of insulin: Secondary | ICD-10-CM

## 2018-07-31 DIAGNOSIS — N183 Chronic kidney disease, stage 3 (moderate): Secondary | ICD-10-CM

## 2018-07-31 LAB — BAYER DCA HB A1C WAIVED: HB A1C (BAYER DCA - WAIVED): 6.2 % (ref <7.0)

## 2018-07-31 MED ORDER — SERTRALINE HCL 100 MG PO TABS: 150.0000 mg | ORAL_TABLET | Freq: Every day | ORAL | 3 refills | Status: DC

## 2018-07-31 MED ORDER — QUETIAPINE FUMARATE 200 MG PO TABS: 200.0000 mg | ORAL_TABLET | Freq: Every day | ORAL | 3 refills | Status: DC

## 2018-07-31 MED ORDER — BUPROPION HCL ER (XL) 300 MG PO TB24: 300.0000 mg | ORAL_TABLET | Freq: Every day | ORAL | 3 refills | Status: DC

## 2018-07-31 MED ORDER — ROSUVASTATIN CALCIUM 10 MG PO TABS: 10.0000 mg | ORAL_TABLET | Freq: Every day | ORAL | 3 refills | Status: DC

## 2018-07-31 MED ORDER — PANTOPRAZOLE SODIUM 20 MG PO TBEC: 20.0000 mg | DELAYED_RELEASE_TABLET | Freq: Every day | ORAL | 3 refills | Status: DC

## 2018-07-31 NOTE — Progress Notes (Signed)
BP 130/78   Pulse 73   Temp 99.2 F (37.3 C) (Oral)   Ht '6\' 2"'$  (1.88 m)   Wt 252 lb 12.8 oz (114.7 kg)   BMI 32.46 kg/m    Subjective:    Patient ID: Joseph [name], male    DOB: 04/26/1961, 58 y.o.   MRN: [id number]  HPI: Joseph [name] is a 58 y.o. male presenting on 07/31/2018 for Diabetes (6 month followup) and Hyperlipidemia   HPI Type 2 diabetes mellitus Patient comes in today for recheck of his diabetes. Patient has been currently taking no medication and has been diet controlled and doing well with his diabetes. Patient is not currently on an ACE inhibitor/ARB. Patient has not seen an ophthalmologist this year. Patient denies any issues with their feet.   Hyperlipidemia Patient is coming in for recheck of his hyperlipidemia. The patient is currently taking Crestor. They deny any issues with myalgias or history of liver damage from it. They deny any focal numbness or weakness or chest pain.   Hypothyroidism recheck Patient is coming in for thyroid recheck today as well. They deny any issues with hair changes or heat or cold problems or diarrhea or constipation. They deny any chest pain or palpitations. They are currently on levothyroxine 175 micrograms   GERD Patient is currently on Protonix.  She denies any major symptoms or abdominal pain or belching or burping. She denies any blood in her stool or lightheadedness or dizziness.   Patient is coming in for depression anxiety recheck as well.  He is currently on Wellbutrin and Zoloft and had been doing well on those until Halloween this last year when his grandchild was 64 year old's was hit by a vehicle and killed and their family has been very traumatized since this Christmas was very difficult without having him at the house and the loss is been significantly affecting him and his daughter the rest of their family.  He has not spoken with anybody but is going through the grieving process but says is just very hard.  He  does not want to increase medications but does say things which is been very challenging and difficult. Depression screen [facility] 2/9 07/31/2018 03/07/2018 01/25/2018 07/27/2017 01/24/2017  Decreased Interest 0 0 0 0 0  Down, Depressed, Hopeless 0 0 0 0 1  PHQ - 2 Score 0 0 0 0 1  Altered sleeping - - - - -  Tired, decreased energy - - - - -  Change in appetite - - - - -  Feeling bad or failure about yourself  - - - - -  Trouble concentrating - - - - -  Moving slowly or fidgety/restless - - - - -  Suicidal thoughts - - - - -  PHQ-9 Score - - - - -     Relevant past medical, surgical, family and social history reviewed and updated as indicated. Interim medical history since our last visit reviewed. Allergies and medications reviewed and updated.  Review of Systems  Constitutional: Negative for chills and fever.  Eyes: Negative for discharge and visual disturbance.  Respiratory: Negative for shortness of breath and wheezing.   Cardiovascular: Negative for chest pain and leg swelling.  Musculoskeletal: Negative for back pain and gait problem.  Skin: Negative for rash.  Neurological: Negative for dizziness, weakness, light-headedness and numbness.  Psychiatric/Behavioral: Positive for decreased concentration and dysphoric mood. Negative for self-injury, sleep disturbance and suicidal ideas. The patient is nervous/anxious.   All other systems  reviewed and are negative.   Per HPI unless specifically indicated above   Allergies as of 07/31/2018   No Known Allergies     Medication List       Accurate as of July 31, 2018  4:04 PM. Always use your most recent med list.        buPROPion 300 MG 24 hr tablet Commonly known as:  WELLBUTRIN XL Take 1 tablet (300 mg total) by mouth daily.   doxepin 10 MG capsule Commonly known as:  SINEQUAN Take 1 capsule (10 mg total) by mouth at bedtime. Start taking on:  August 14, 2018   Fluticasone-Umeclidin-Vilant 100-62.5-25 MCG/INH Aepb Commonly  known as:  TRELEGY ELLIPTA Inhale 1 puff into the lungs daily.   levothyroxine 175 MCG tablet Commonly known as:  SYNTHROID, LEVOTHROID Take 1 tablet (175 mcg total) by mouth daily before breakfast.   pantoprazole 20 MG tablet Commonly known as:  PROTONIX Take 1 tablet (20 mg total) by mouth daily.   QUEtiapine 200 MG tablet Commonly known as:  SEROQUEL Take 1 tablet (200 mg total) by mouth at bedtime.   rosuvastatin 10 MG tablet Commonly known as:  CRESTOR Take 1 tablet (10 mg total) by mouth daily.   sertraline 100 MG tablet Commonly known as:  ZOLOFT Take 1.5 tablets (150 mg total) by mouth daily. Start taking on:  August 07, 2018   tiotropium 18 MCG inhalation capsule Commonly known as:  SPIRIVA HANDIHALER Place 1 capsule (18 mcg total) into inhaler and inhale daily.   Vitamin D3 125 MCG (5000 UT) Caps Take 5,000 Units by mouth.          Objective:    BP 130/78   Pulse 73   Temp 99.2 F (37.3 C) (Oral)   Ht 6' 2"  (1.88 m)   Wt 252 lb 12.8 oz (114.7 kg)   BMI 32.46 kg/m   Wt Readings from Last 3 Encounters:  07/31/18 252 lb 12.8 oz (114.7 kg)  03/07/18 245 lb (111.1 kg)  01/25/18 240 lb (108.9 kg)    Physical Exam Vitals signs and nursing note reviewed.  Constitutional:      General: He is not in acute distress.    Appearance: He is well-developed. He is not diaphoretic.  Eyes:     General: No scleral icterus.    Conjunctiva/sclera: Conjunctivae normal.  Neck:     Musculoskeletal: Neck supple.     Thyroid: No thyromegaly.  Cardiovascular:     Rate and Rhythm: Normal rate and regular rhythm.     Heart sounds: Normal heart sounds. No murmur.  Pulmonary:     Effort: Pulmonary effort is normal. No respiratory distress.     Breath sounds: Normal breath sounds. No wheezing.  Musculoskeletal: Normal range of motion.  Lymphadenopathy:     Cervical: No cervical adenopathy.  Skin:    General: Skin is warm and dry.     Findings: No rash.    Neurological:     Mental Status: He is alert and oriented to person, place, and time.     Coordination: Coordination normal.  Psychiatric:        Behavior: Behavior normal.         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[place])   Relevant Medications  rosuvastatin (CRESTOR) 10 MG tablet   Other Relevant Orders   Bayer DCA Hb A1c Waived   CMP14+EGFR   Hyperlipidemia associated with type 2 diabetes mellitus ([facility])   Relevant Medications   rosuvastatin (CRESTOR) 10 MG tablet     Other   Anxiety   Relevant Medications   buPROPion (WELLBUTRIN XL) 300 MG 24 hr tablet   sertraline (ZOLOFT) 100 MG tablet (Start on 08/07/2018)   Other Relevant Orders   CBC with Differential/Platelet    Other Visit Diagnoses    Gastroesophageal reflux disease, esophagitis presence not specified       Relevant Medications   pantoprazole (PROTONIX) 20 MG tablet   Other Relevant Orders   CBC with Differential/Platelet   Mixed hyperlipidemia       Relevant Medications   rosuvastatin (CRESTOR) 10 MG tablet   Other Relevant Orders   Lipid panel      Wellbutrin and Zoloft will continue for now and continue Protonix and Crestor as well. Follow up plan: Return in about 6 months (around 01/29/2019), or if symptoms worsen or fail to improve, for Hyperlipidemia and diabetes.  Counseling provided for all of the vaccine components Orders Placed This Encounter  Procedures  . Bayer DCA Hb A1c Waived  . CBC with Differential/Platelet  . CMP14+EGFR  . Lipid panel  . TSH    Caryl Pina, MD [facility] 07/31/2018, 4:04 PM

## 2018-07-31 NOTE — Addendum Note (Signed)
Addended by: Nigel Berthold C on: 07/31/2018 04:10 PM   Modules accepted: Orders

## 2018-08-01 LAB — CMP14+EGFR
ALT: 34 IU/L (ref 0–44)
AST: 28 IU/L (ref 0–40)
Albumin/Globulin Ratio: 1.8 (ref 1.2–2.2)
Albumin: 4.3 g/dL (ref 3.5–5.5)
Alkaline Phosphatase: 35 IU/L — ABNORMAL LOW (ref 39–117)
BUN/Creatinine Ratio: 16 (ref 9–20)
BUN: 21 mg/dL (ref 6–24)
Bilirubin Total: <0.2 mg/dL (ref 0.0–1.2)
CO2: 22 mmol/L (ref 20–29)
Calcium: 9.5 mg/dL (ref 8.7–10.2)
Chloride: 102 mmol/L (ref 96–106)
Creatinine, Ser: 1.3 mg/dL — ABNORMAL HIGH (ref 0.76–1.27)
GFR calc Af Amer: 70 mL/min/{1.73_m2} (ref >59)
GFR calc non Af Amer: 61 mL/min/{1.73_m2} (ref >59)
Globulin, Total: 2.4 g/dL (ref 1.5–4.5)
Glucose: 144 mg/dL — ABNORMAL HIGH (ref 65–99)
Potassium: 4.8 mmol/L (ref 3.5–5.2)
Sodium: 139 mmol/L (ref 134–144)
Total Protein: 6.7 g/dL (ref 6.0–8.5)

## 2018-08-01 LAB — CBC WITH DIFFERENTIAL/PLATELET
Basophils Absolute: 0.1 10*3/uL (ref 0.0–0.2)
Basos: 1 %
EOS (ABSOLUTE): 0.1 10*3/uL (ref 0.0–0.4)
Eos: 2 %
Hematocrit: 38.8 % (ref 37.5–51.0)
Hemoglobin: 13.4 g/dL (ref 13.0–17.7)
Immature Grans (Abs): 0 10*3/uL (ref 0.0–0.1)
Immature Granulocytes: 0 %
Lymphocytes Absolute: 1.5 10*3/uL (ref 0.7–3.1)
Lymphs: 28 %
MCH: 30.9 pg (ref 26.6–33.0)
MCHC: 34.5 g/dL (ref 31.5–35.7)
MCV: 89 fL (ref 79–97)
Monocytes Absolute: 0.5 10*3/uL (ref 0.1–0.9)
Monocytes: 10 %
Neutrophils Absolute: 3.1 10*3/uL (ref 1.4–7.0)
Neutrophils: 59 %
Platelets: 157 10*3/uL (ref 150–450)
RBC: 4.34 x10E6/uL (ref 4.14–5.80)
RDW: 13.4 % (ref 11.6–15.4)
WBC: 5.2 10*3/uL (ref 3.4–10.8)

## 2018-08-01 LAB — LIPID PANEL
CHOL/HDL RATIO: 5.1 ratio — AB (ref 0.0–5.0)
Cholesterol, Total: 252 mg/dL — ABNORMAL HIGH (ref 100–199)
HDL: 49 mg/dL (ref >39)
Triglycerides: 589 mg/dL (ref 0–149)

## 2018-08-01 LAB — TSH: TSH: 9.01 u[IU]/mL — ABNORMAL HIGH (ref 0.450–4.500)

## 2018-08-08 DIAGNOSIS — E1142 Type 2 diabetes mellitus with diabetic polyneuropathy: Secondary | ICD-10-CM | POA: Diagnosis not present

## 2018-08-08 DIAGNOSIS — K5900 Constipation, unspecified: Secondary | ICD-10-CM | POA: Diagnosis not present

## 2018-08-08 DIAGNOSIS — G894 Chronic pain syndrome: Secondary | ICD-10-CM | POA: Diagnosis not present

## 2018-08-08 DIAGNOSIS — M47817 Spondylosis without myelopathy or radiculopathy, lumbosacral region: Secondary | ICD-10-CM | POA: Diagnosis not present

## 2018-08-24 ENCOUNTER — Telehealth: Payer: Self-pay | Admitting: Family Medicine

## 2018-08-24 ENCOUNTER — Other Ambulatory Visit: Payer: Self-pay | Admitting: *Deleted

## 2018-08-24 MED ORDER — LEVOTHYROXINE SODIUM 200 MCG PO TABS: 200.0000 ug | ORAL_TABLET | Freq: Every day | ORAL | 1 refills | Status: DC

## 2018-08-24 NOTE — Telephone Encounter (Signed)
PT wife has called wanting to talk to nurse about lab results wants to get some clarification on results

## 2018-08-24 NOTE — Telephone Encounter (Signed)
Aware of results and recommendations  
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
## 2018-08-29 ENCOUNTER — Encounter (HOSPITAL_COMMUNITY): Payer: Self-pay | Admitting: Psychiatry

## 2018-08-29 ENCOUNTER — Ambulatory Visit (INDEPENDENT_AMBULATORY_CARE_PROVIDER_SITE_OTHER): Payer: Medicare Other | Admitting: Psychiatry

## 2018-08-29 VITALS — BP 111/75 | HR 93 | Ht 74.0 in | Wt 249.0 lb

## 2018-08-29 DIAGNOSIS — F331 Major depressive disorder, recurrent, moderate: Secondary | ICD-10-CM | POA: Diagnosis not present

## 2018-08-29 MED ORDER — BUPROPION HCL ER (XL) 150 MG PO TB24: 150.0000 mg | ORAL_TABLET | Freq: Every day | ORAL | 0 refills | Status: DC

## 2018-08-29 MED ORDER — DOXEPIN HCL 10 MG PO CAPS: 10.0000 mg | ORAL_CAPSULE | Freq: Every day | ORAL | 0 refills | Status: DC

## 2018-08-29 NOTE — Patient Instructions (Signed)
1. Continue sertraline 150 mg daily  2. Decrease bupropion 150 mg at night 3.Continue doxepin 10 mg at night 4. Continue quetiapine 200 mg at night 5.Return to clinic intwo monthsfor 15 mins

## 2018-09-03 ENCOUNTER — Other Ambulatory Visit: Payer: Self-pay | Admitting: Family Medicine

## 2018-09-03 DIAGNOSIS — E785 Hyperlipidemia, unspecified: Principal | ICD-10-CM

## 2018-09-03 DIAGNOSIS — E1169 Type 2 diabetes mellitus with other specified complication: Secondary | ICD-10-CM

## 2018-09-07 DIAGNOSIS — E1142 Type 2 diabetes mellitus with diabetic polyneuropathy: Secondary | ICD-10-CM | POA: Diagnosis not present

## 2018-09-07 DIAGNOSIS — M47817 Spondylosis without myelopathy or radiculopathy, lumbosacral region: Secondary | ICD-10-CM | POA: Diagnosis not present

## 2018-09-07 DIAGNOSIS — G894 Chronic pain syndrome: Secondary | ICD-10-CM | POA: Diagnosis not present

## 2018-09-07 DIAGNOSIS — K5900 Constipation, unspecified: Secondary | ICD-10-CM | POA: Diagnosis not present

## 2018-09-09 ENCOUNTER — Other Ambulatory Visit: Payer: Self-pay | Admitting: Family Medicine

## 2018-09-28 ENCOUNTER — Telehealth: Payer: Self-pay | Admitting: Family Medicine

## 2018-09-28 NOTE — Telephone Encounter (Signed)
error 

## 2018-09-29 ENCOUNTER — Other Ambulatory Visit: Payer: Self-pay

## 2018-09-29 ENCOUNTER — Encounter: Payer: Self-pay | Admitting: Family Medicine

## 2018-09-29 ENCOUNTER — Ambulatory Visit (INDEPENDENT_AMBULATORY_CARE_PROVIDER_SITE_OTHER): Payer: Medicare Other | Admitting: Family Medicine

## 2018-09-29 VITALS — BP 150/91 | HR 80 | Temp 97.0°F | Ht 74.0 in | Wt 250.6 lb

## 2018-09-29 DIAGNOSIS — J441 Chronic obstructive pulmonary disease with (acute) exacerbation: Secondary | ICD-10-CM

## 2018-09-29 MED ORDER — AMOXICILLIN-POT CLAVULANATE 875-125 MG PO TABS: 1.0000 | ORAL_TABLET | Freq: Two times a day (BID) | ORAL | 0 refills | Status: DC

## 2018-09-29 MED ORDER — PREDNISONE 20 MG PO TABS: ORAL_TABLET | ORAL | 0 refills | Status: DC

## 2018-09-29 MED ORDER — ALBUTEROL SULFATE HFA 108 (90 BASE) MCG/ACT IN AERS: 2.0000 | INHALATION_SPRAY | Freq: Four times a day (QID) | RESPIRATORY_TRACT | 0 refills | Status: DC | PRN

## 2018-09-29 NOTE — Progress Notes (Signed)
BP (!) 150/91    Pulse 80    Temp (!) 97 F (36.1 C) (Oral)    Ht 6\' 2"  (1.88 m)    Wt 250 lb 9.6 oz (113.7 kg)    SpO2 93%    BMI 32.18 kg/m    Subjective:    Patient ID: Joseph [name], male    DOB: Apr 17, 1961, 58 y.o.   MRN: [id number]  HPI: [name] is a 58 y.o. male presenting on 09/29/2018 for chest congestion (x 1 week); Shortness of Breath; and Cough   HPI Cough congestion and chest tightness Patient comes in complaining of cough and congestion and chest tightness that has been going on for the past week but has been worsening.  He feels like he starting to get a pneumonia.  He denies any fevers or chills but does feel like he is getting some chest tightness and shortness of breath and wheezing that is been worsening.  He says the phlegm is very thick and is not clearing.  He continues to use his Symbicort to help and it has been helping but he still been getting slightly worse.  He has had some problems with pneumonia previously.  Relevant past medical, surgical, family and social history reviewed and updated as indicated. Interim medical history since our last visit reviewed. Allergies and medications reviewed and updated.  Review of Systems  Constitutional: Negative for chills and fever.  HENT: Positive for congestion. Negative for ear discharge, ear pain, postnasal drip, rhinorrhea, sinus pressure, sneezing, sore throat and voice change.   Eyes: Negative for pain, discharge, redness and visual disturbance.  Respiratory: Positive for cough, shortness of breath and wheezing.   Cardiovascular: Negative for chest pain and leg swelling.  Musculoskeletal: Negative for gait problem and myalgias.  Skin: Negative for rash.  All other systems reviewed and are negative.   Per HPI unless specifically indicated above   Allergies as of 09/29/2018   No Known Allergies     Medication List       Accurate as of September 29, 2018  9:27 AM. Always use your most recent med  list.        amoxicillin-clavulanate 875-125 MG tablet Commonly known as:  AUGMENTIN Take 1 tablet by mouth 2 (two) times daily.   buPROPion 300 MG 24 hr tablet Commonly known as:  WELLBUTRIN XL Take 1 tablet (300 mg total) by mouth daily.   buPROPion 150 MG 24 hr tablet Commonly known as:  WELLBUTRIN XL Take 1 tablet (150 mg total) by mouth daily.   doxepin 10 MG capsule Commonly known as:  SINEQUAN Take 1 capsule (10 mg total) by mouth at bedtime.   fenofibrate 160 MG tablet TAKE 1 TABLET BY MOUTH EVERY DAY   Fluticasone-Umeclidin-Vilant 100-62.5-25 MCG/INH Aepb Commonly known as:  Trelegy Ellipta Inhale 1 puff into the lungs daily.   levothyroxine 200 MCG tablet Commonly known as:  Synthroid Take 1 tablet (200 mcg total) by mouth daily before breakfast.   pantoprazole 20 MG tablet Commonly known as:  PROTONIX Take 1 tablet (20 mg total) by mouth daily.   predniSONE 20 MG tablet Commonly known as:  DELTASONE 2 po at same time daily for 5 days   QUEtiapine 200 MG tablet Commonly known as:  SEROQUEL Take 1 tablet (200 mg total) by mouth at bedtime.   rosuvastatin 10 MG tablet Commonly known as:  CRESTOR Take 1 tablet (10 mg total) by mouth daily.   sertraline 100 MG  tablet Commonly known as:  ZOLOFT Take 1.5 tablets (150 mg total) by mouth daily.   tiotropium 18 MCG inhalation capsule Commonly known as:  Spiriva HandiHaler Place 1 capsule (18 mcg total) into inhaler and inhale daily.   Vitamin D3 125 MCG (5000 UT) Caps Take 5,000 Units by mouth.          Objective:    BP (!) 150/91    Pulse 80    Temp (!) 97 F (36.1 C) (Oral)    Ht 6\' 2"  (1.88 m)    Wt 250 lb 9.6 oz (113.7 kg)    SpO2 93%    BMI 32.18 kg/m   Wt Readings from Last 3 Encounters:  09/29/18 250 lb 9.6 oz (113.7 kg)  07/31/18 252 lb 12.8 oz (114.7 kg)  03/07/18 245 lb (111.1 kg)    Physical Exam Vitals signs and nursing note reviewed.  Constitutional:      General: He is not in  acute distress.    Appearance: He is well-developed. He is not diaphoretic.  Eyes:     General: No scleral icterus.       Right eye: No discharge.     Conjunctiva/sclera: Conjunctivae normal.     Pupils: Pupils are equal, round, and reactive to light.  Neck:     Musculoskeletal: Neck supple.     Thyroid: No thyromegaly.  Cardiovascular:     Rate and Rhythm: Normal rate and regular rhythm.     Heart sounds: Normal heart sounds. No murmur.  Pulmonary:     Effort: Pulmonary effort is normal. No respiratory distress.     Breath sounds: Examination of the right-upper field reveals wheezing. Examination of the left-upper field reveals wheezing. Examination of the right-middle field reveals wheezing. Examination of the left-middle field reveals wheezing. Wheezing and rhonchi present. No rales.  Musculoskeletal: Normal range of motion.  Lymphadenopathy:     Cervical: No cervical adenopathy.  Skin:    General: Skin is warm and dry.     Findings: No rash.  Neurological:     Mental Status: He is alert and oriented to person, place, and time.     Coordination: Coordination normal.  Psychiatric:        Behavior: Behavior normal.         Assessment & Plan:   Problem List Items Addressed This Visit    None    Visit Diagnoses    COPD exacerbation ([place])    -  Primary   Relevant Medications   amoxicillin-clavulanate (AUGMENTIN) 875-125 MG tablet   predniSONE (DELTASONE) 20 MG tablet   albuterol (PROVENTIL HFA;VENTOLIN HFA) 108 (90 Base) MCG/ACT inhaler      No sounds of pneumonia in the lungs currently but it sounds like he is heading that direction, will treat like COPD exacerbation. Follow up plan: Return if symptoms worsen or fail to improve.  Counseling provided for all of the vaccine components No orders of the defined types were placed in this encounter.   Caryl Pina, MD [facility] 09/29/2018, 9:27 AM

## 2018-10-09 DIAGNOSIS — E1142 Type 2 diabetes mellitus with diabetic polyneuropathy: Secondary | ICD-10-CM | POA: Diagnosis not present

## 2018-10-09 DIAGNOSIS — K5900 Constipation, unspecified: Secondary | ICD-10-CM | POA: Diagnosis not present

## 2018-10-09 DIAGNOSIS — M47817 Spondylosis without myelopathy or radiculopathy, lumbosacral region: Secondary | ICD-10-CM | POA: Diagnosis not present

## 2018-10-09 DIAGNOSIS — G894 Chronic pain syndrome: Secondary | ICD-10-CM | POA: Diagnosis not present

## 2018-10-22 ENCOUNTER — Other Ambulatory Visit: Payer: Self-pay | Admitting: Family Medicine

## 2018-10-22 DIAGNOSIS — J441 Chronic obstructive pulmonary disease with (acute) exacerbation: Secondary | ICD-10-CM

## 2018-10-24 ENCOUNTER — Telehealth: Payer: Self-pay | Admitting: Family Medicine

## 2018-10-24 DIAGNOSIS — L249 Irritant contact dermatitis, unspecified cause: Secondary | ICD-10-CM

## 2018-10-24 MED ORDER — TRIAMCINOLONE ACETONIDE 0.1 % EX CREA: 1.0000 "application " | TOPICAL_CREAM | Freq: Two times a day (BID) | CUTANEOUS | 1 refills | Status: DC
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
## 2018-10-24 NOTE — Telephone Encounter (Signed)
Refill sent and wife aware

## 2018-10-24 NOTE — Telephone Encounter (Signed)
What is the name of the medication? triamcinolone cream (KENALOG) 0.1 %  Have you contacted your pharmacy to request a refill? Yes   Which pharmacy would you like this sent to? [facility]   Patient notified that their request is being sent to the clinical staff for review and that they should receive a call once it is complete. If they do not receive a call within 24 hours they can check with their pharmacy or our office.

## 2018-10-30 ENCOUNTER — Encounter (HOSPITAL_COMMUNITY): Payer: Self-pay | Admitting: Psychiatry

## 2018-10-30 ENCOUNTER — Ambulatory Visit (INDEPENDENT_AMBULATORY_CARE_PROVIDER_SITE_OTHER): Payer: Medicare Other | Admitting: Psychiatry

## 2018-10-30 ENCOUNTER — Other Ambulatory Visit: Payer: Self-pay

## 2018-10-30 DIAGNOSIS — Z634 Disappearance and death of family member: Secondary | ICD-10-CM | POA: Diagnosis not present

## 2018-10-30 DIAGNOSIS — F330 Major depressive disorder, recurrent, mild: Secondary | ICD-10-CM | POA: Diagnosis not present

## 2018-10-30 DIAGNOSIS — Z630 Problems in relationship with spouse or partner: Secondary | ICD-10-CM | POA: Diagnosis not present

## 2018-10-30 MED ORDER — DOXEPIN HCL 10 MG PO CAPS: 10.0000 mg | ORAL_CAPSULE | Freq: Every day | ORAL | 0 refills | Status: DC

## 2018-10-30 MED ORDER — BUPROPION HCL ER (XL) 150 MG PO TB24: 150.0000 mg | ORAL_TABLET | Freq: Every day | ORAL | 0 refills | Status: DC

## 2018-10-30 NOTE — Patient Instructions (Signed)
1. Continue sertraline 150 mg daily  2.Decreasebupropion150 mg at night 3.Continue doxepin 10 mg at night 4. Continue quetiapine 200 mg at night 5. Next appointment on 6/11 at 1:30 for 15 mins, phone

## 2018-11-08 DIAGNOSIS — E1142 Type 2 diabetes mellitus with diabetic polyneuropathy: Secondary | ICD-10-CM | POA: Diagnosis not present

## 2018-11-08 DIAGNOSIS — K5900 Constipation, unspecified: Secondary | ICD-10-CM | POA: Diagnosis not present

## 2018-11-08 DIAGNOSIS — M47817 Spondylosis without myelopathy or radiculopathy, lumbosacral region: Secondary | ICD-10-CM | POA: Diagnosis not present

## 2018-11-08 DIAGNOSIS — G894 Chronic pain syndrome: Secondary | ICD-10-CM | POA: Diagnosis not present

## 2018-12-07 DIAGNOSIS — K5900 Constipation, unspecified: Secondary | ICD-10-CM | POA: Diagnosis not present

## 2018-12-07 DIAGNOSIS — E1142 Type 2 diabetes mellitus with diabetic polyneuropathy: Secondary | ICD-10-CM | POA: Diagnosis not present

## 2018-12-07 DIAGNOSIS — G894 Chronic pain syndrome: Secondary | ICD-10-CM | POA: Diagnosis not present

## 2018-12-07 DIAGNOSIS — M47817 Spondylosis without myelopathy or radiculopathy, lumbosacral region: Secondary | ICD-10-CM | POA: Diagnosis not present
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
## 2018-12-28 ENCOUNTER — Ambulatory Visit (INDEPENDENT_AMBULATORY_CARE_PROVIDER_SITE_OTHER): Payer: Medicare Other | Admitting: Psychiatry

## 2018-12-28 ENCOUNTER — Other Ambulatory Visit: Payer: Self-pay

## 2018-12-28 ENCOUNTER — Encounter (HOSPITAL_COMMUNITY): Payer: Self-pay | Admitting: Psychiatry

## 2018-12-28 DIAGNOSIS — F330 Major depressive disorder, recurrent, mild: Secondary | ICD-10-CM | POA: Diagnosis not present

## 2018-12-28 MED ORDER — BUPROPION HCL ER (XL) 150 MG PO TB24: 150.0000 mg | ORAL_TABLET | Freq: Every day | ORAL | 0 refills | Status: DC

## 2018-12-28 MED ORDER — DOXEPIN HCL 10 MG PO CAPS: 10.0000 mg | ORAL_CAPSULE | Freq: Every day | ORAL | 0 refills | Status: DC

## 2018-12-28 NOTE — Patient Instructions (Signed)
1. Continue sertraline 150 mg daily  2.Continuebupropion150 mg at night 3.Continue doxepin 10 mg at night 4. Continue quetiapine 200 mg at night 5. Next appointment: 8/27 at 1:40

## 2019-01-08 DIAGNOSIS — M47817 Spondylosis without myelopathy or radiculopathy, lumbosacral region: Secondary | ICD-10-CM | POA: Diagnosis not present

## 2019-01-08 DIAGNOSIS — G894 Chronic pain syndrome: Secondary | ICD-10-CM | POA: Diagnosis not present

## 2019-01-08 DIAGNOSIS — E1142 Type 2 diabetes mellitus with diabetic polyneuropathy: Secondary | ICD-10-CM | POA: Diagnosis not present

## 2019-01-08 DIAGNOSIS — K5900 Constipation, unspecified: Secondary | ICD-10-CM | POA: Diagnosis not present

## 2019-01-26 ENCOUNTER — Other Ambulatory Visit: Payer: Self-pay

## 2019-01-29 ENCOUNTER — Encounter: Payer: Self-pay | Admitting: Family Medicine

## 2019-01-29 ENCOUNTER — Other Ambulatory Visit: Payer: Self-pay

## 2019-01-29 ENCOUNTER — Ambulatory Visit (INDEPENDENT_AMBULATORY_CARE_PROVIDER_SITE_OTHER): Payer: Medicare Other | Admitting: Family Medicine

## 2019-01-29 VITALS — BP 152/98 | HR 85 | Temp 98.0°F | Ht 74.0 in | Wt 242.2 lb

## 2019-01-29 DIAGNOSIS — K219 Gastro-esophageal reflux disease without esophagitis: Secondary | ICD-10-CM

## 2019-01-29 DIAGNOSIS — N183 Chronic kidney disease, stage 3 unspecified: Secondary | ICD-10-CM

## 2019-01-29 DIAGNOSIS — E1169 Type 2 diabetes mellitus with other specified complication: Secondary | ICD-10-CM

## 2019-01-29 DIAGNOSIS — Z794 Long term (current) use of insulin: Secondary | ICD-10-CM | POA: Diagnosis not present

## 2019-01-29 DIAGNOSIS — E785 Hyperlipidemia, unspecified: Secondary | ICD-10-CM | POA: Diagnosis not present

## 2019-01-29 DIAGNOSIS — E039 Hypothyroidism, unspecified: Secondary | ICD-10-CM

## 2019-01-29 DIAGNOSIS — E0822 Diabetes mellitus due to underlying condition with diabetic chronic kidney disease: Secondary | ICD-10-CM

## 2019-01-29 DIAGNOSIS — E782 Mixed hyperlipidemia: Secondary | ICD-10-CM | POA: Diagnosis not present

## 2019-01-29 LAB — BAYER DCA HB A1C WAIVED: HB A1C (BAYER DCA - WAIVED): 5.8 % (ref <7.0)

## 2019-01-29 NOTE — Progress Notes (Signed)
BP (!) 152/98   Pulse 85   Temp 98 F (36.7 C) (Oral)   Ht _0  (1.88 m)   Wt 242 lb 3.2 oz (109.9 kg)   BMI 31.10 kg/m    Subjective:   Patient ID: Joseph [name], male    DOB: 24-Nov-1960, 58 y.o.   MRN: [id number]  HPI: Joseph [name] is a 58 y.o. male presenting on 01/29/2019 for Hypothyroidism (6 month follow up of chronic medical conditions) and Diabetes   HPI Type 2 diabetes mellitus Patient comes in today for recheck of his diabetes. Patient has been currently taking no medication and has been diet controlled. Patient is not currently on an ACE inhibitor/ARB. Patient has not seen an ophthalmologist this year. Patient denies any issues with their feet.  Patient has stage III kidney disease and we are monitoring for now  Hypothyroidism recheck Patient is coming in for thyroid recheck today as well. They deny any issues with hair changes or heat or cold problems or diarrhea or constipation. They deny any chest pain or palpitations. They are currently on levothyroxine 200 micrograms   Hyperlipidemia Patient is coming in for recheck of his hyperlipidemia. The patient is currently taking Crestor. They deny any issues with myalgias or history of liver damage from it. They deny any focal numbness or weakness or chest pain.   GERD Patient is currently on pantoprazole.  She denies any major symptoms or abdominal pain or belching or burping. She denies any blood in her stool or lightheadedness or dizziness.   COPD Patient is coming in for COPD recheck today.  He is currently on Trelegy.  He has a mild chronic cough but denies any major coughing spells or wheezing spells.  He has 0nighttime symptoms per week and 3daytime symptoms per week currently.  Patient says he is having some symptoms right now with the heat but only if he goes outside, if he stays inside he does not have the symptoms.  Relevant past medical, surgical, family and social history reviewed and updated as  indicated. Interim medical history since our last visit reviewed. Allergies and medications reviewed and updated.  Review of Systems  Constitutional: Negative for chills and fever.  Eyes: Negative for visual disturbance.  Respiratory: Negative for shortness of breath and wheezing.   Cardiovascular: Negative for chest pain and leg swelling.  Musculoskeletal: Negative for back pain and gait problem.  Skin: Negative for rash.  Neurological: Negative for dizziness, weakness, light-headedness and numbness.  All other systems reviewed and are negative.   Per HPI unless specifically indicated above   Allergies as of 01/29/2019   No Known Allergies     Medication List       Accurate as of January 29, 2019  4:05 PM. If you have any questions, ask your nurse or doctor.        STOP taking these medications   amoxicillin-clavulanate 875-125 MG tablet Commonly known as: AUGMENTIN Stopped by: Fransisca Kaufmann Dessirae Scarola, MD   predniSONE 20 MG tablet Commonly known as: DELTASONE Stopped by: Fransisca Kaufmann Lien Lyman, MD     TAKE these medications   buPROPion 150 MG 24 hr tablet Commonly known as: WELLBUTRIN XL Take 1 tablet (150 mg total) by mouth daily. What changed: Another medication with the same name was removed. Continue taking this medication, and follow the directions you see here. Changed by: Fransisca Kaufmann Berlene Dixson, MD   doxepin 10 MG capsule Commonly known as: SINEQUAN Take 1 capsule (10 mg  total) by mouth at bedtime.   fenofibrate 160 MG tablet TAKE 1 TABLET BY MOUTH EVERY DAY   Fluticasone-Umeclidin-Vilant 100-62.5-25 MCG/INH Aepb Commonly known as: Trelegy Ellipta Inhale 1 puff into the lungs daily.   levothyroxine 200 MCG tablet Commonly known as: Synthroid Take 1 tablet (200 mcg total) by mouth daily before breakfast.   pantoprazole 20 MG tablet Commonly known as: PROTONIX Take 1 tablet (20 mg total) by mouth daily.   QUEtiapine 200 MG tablet Commonly known as: SEROQUEL Take  1 tablet (200 mg total) by mouth at bedtime.   rosuvastatin 10 MG tablet Commonly known as: CRESTOR Take 1 tablet (10 mg total) by mouth daily.   sertraline 100 MG tablet Commonly known as: ZOLOFT Take 1.5 tablets (150 mg total) by mouth daily.   tiotropium 18 MCG inhalation capsule Commonly known as: Spiriva HandiHaler Place 1 capsule (18 mcg total) into inhaler and inhale daily.   triamcinolone cream 0.1 % Commonly known as: KENALOG Apply 1 application topically 2 (two) times daily.   Ventolin HFA 108 (90 Base) MCG/ACT inhaler Generic drug: albuterol TAKE 2 PUFFS BY MOUTH EVERY 6 HOURS AS NEEDED FOR WHEEZE OR SHORTNESS OF BREATH   Vitamin D3 125 MCG (5000 UT) Caps Take 5,000 Units by mouth.        Objective:   BP (!) 152/98   Pulse 85   Temp 98 F (36.7 C) (Oral)   Ht _0  (1.88 m)   Wt 242 lb 3.2 oz (109.9 kg)   BMI 31.10 kg/m   Wt Readings from Last 3 Encounters:  01/29/19 242 lb 3.2 oz (109.9 kg)  09/29/18 250 lb 9.6 oz (113.7 kg)  07/31/18 252 lb 12.8 oz (114.7 kg)    Physical Exam Vitals signs and nursing note reviewed.  Constitutional:      General: He is not in acute distress.    Appearance: He is well-developed. He is not diaphoretic.     Comments: Patient has a tracheostomy  Eyes:     General: No scleral icterus.    Conjunctiva/sclera: Conjunctivae normal.  Neck:     Musculoskeletal: Neck supple.     Thyroid: No thyromegaly.  Cardiovascular:     Rate and Rhythm: Normal rate and regular rhythm.     Heart sounds: Normal heart sounds. No murmur.  Pulmonary:     Effort: Pulmonary effort is normal. No respiratory distress.     Breath sounds: Normal breath sounds. No wheezing.  Musculoskeletal: Normal range of motion.  Lymphadenopathy:     Cervical: No cervical adenopathy.  Skin:    General: Skin is warm and dry.     Findings: No rash.  Neurological:     Mental Status: He is alert and oriented to person, place, and time.     Coordination:  Coordination normal.  Psychiatric:        Behavior: Behavior normal.       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[place])   Relevant Orders   CMP14+EGFR   Bayer DCA Hb A1c Waived   Hyperlipidemia associated with type 2 diabetes mellitus ([place])   Relevant Orders   Lipid panel    Other Visit Diagnoses    Mixed hyperlipidemia       Gastroesophageal reflux disease, esophagitis presence not specified  Relevant Orders   CBC with Differential/Platelet      Patient's blood pressures are running high, will have him come in and check it over the next couple weeks and see where it is running  Blood pressure may be up because of wearing a mask and difficulty breathing with the mask Follow up plan: Return in about 6 months (around 08/01/2019), or if symptoms worsen or fail to improve, for Diabetes and thyroid and hypertension recheck.  Counseling provided for all of the vaccine components Orders Placed This Encounter  Procedures  . CBC with Differential/Platelet  . CMP14+EGFR  . Lipid panel  . TSH  . Bayer [facility] Hb A1c [place], MD [facility] 01/29/2019, 4:05 PM

## 2019-01-30 LAB — CBC WITH DIFFERENTIAL/PLATELET
Basophils Absolute: 0.1 10*3/uL (ref 0.0–0.2)
Basos: 1 %
EOS (ABSOLUTE): 0.1 10*3/uL (ref 0.0–0.4)
Eos: 2 %
Hematocrit: 41.9 % (ref 37.5–51.0)
Hemoglobin: 13.9 g/dL (ref 13.0–17.7)
Immature Grans (Abs): 0 10*3/uL (ref 0.0–0.1)
Immature Granulocytes: 0 %
Lymphocytes Absolute: 1.4 10*3/uL (ref 0.7–3.1)
Lymphs: 28 %
MCH: 30.2 pg (ref 26.6–33.0)
MCHC: 33.2 g/dL (ref 31.5–35.7)
MCV: 91 fL (ref 79–97)
Monocytes Absolute: 0.4 10*3/uL (ref 0.1–0.9)
Monocytes: 7 %
Neutrophils Absolute: 3.1 10*3/uL (ref 1.4–7.0)
Neutrophils: 62 %
Platelets: 180 10*3/uL (ref 150–450)
RBC: 4.6 x10E6/uL (ref 4.14–5.80)
RDW: 13 % (ref 11.6–15.4)
WBC: 5 10*3/uL (ref 3.4–10.8)

## 2019-01-30 LAB — CMP14+EGFR
ALT: 28 IU/L (ref 0–44)
AST: 29 IU/L (ref 0–40)
Albumin/Globulin Ratio: 1.8 (ref 1.2–2.2)
Albumin: 4.4 g/dL (ref 3.8–4.9)
Alkaline Phosphatase: 27 IU/L — ABNORMAL LOW (ref 39–117)
BUN/Creatinine Ratio: 14 (ref 9–20)
BUN: 19 mg/dL (ref 6–24)
Bilirubin Total: 0.2 mg/dL (ref 0.0–1.2)
CO2: 23 mmol/L (ref 20–29)
Calcium: 9.2 mg/dL (ref 8.7–10.2)
Chloride: 100 mmol/L (ref 96–106)
Creatinine, Ser: 1.38 mg/dL — ABNORMAL HIGH (ref 0.76–1.27)
GFR calc Af Amer: 65 mL/min/{1.73_m2} (ref >59)
GFR calc non Af Amer: 56 mL/min/{1.73_m2} — ABNORMAL LOW (ref >59)
Globulin, Total: 2.4 g/dL (ref 1.5–4.5)
Glucose: 102 mg/dL — ABNORMAL HIGH (ref 65–99)
Potassium: 4.7 mmol/L (ref 3.5–5.2)
Sodium: 139 mmol/L (ref 134–144)
Total Protein: 6.8 g/dL (ref 6.0–8.5)

## 2019-01-30 LAB — LIPID PANEL
Chol/HDL Ratio: 3.1 ratio (ref 0.0–5.0)
Cholesterol, Total: 163 mg/dL (ref 100–199)
HDL: 53 mg/dL (ref >39)
LDL Calculated: 68 mg/dL (ref 0–99)
Triglycerides: 211 mg/dL — ABNORMAL HIGH (ref 0–149)
VLDL Cholesterol Cal: 42 mg/dL — ABNORMAL HIGH (ref 5–40)

## 2019-01-30 LAB — TSH: TSH: 3.16 u[IU]/mL (ref 0.450–4.500)

## 2019-02-06 DIAGNOSIS — Z79891 Long term (current) use of opiate analgesic: Secondary | ICD-10-CM | POA: Diagnosis not present

## 2019-02-06 DIAGNOSIS — K5900 Constipation, unspecified: Secondary | ICD-10-CM | POA: Diagnosis not present

## 2019-02-06 DIAGNOSIS — E1142 Type 2 diabetes mellitus with diabetic polyneuropathy: Secondary | ICD-10-CM | POA: Diagnosis not present

## 2019-02-06 DIAGNOSIS — G894 Chronic pain syndrome: Secondary | ICD-10-CM | POA: Diagnosis not present

## 2019-02-06 DIAGNOSIS — M47817 Spondylosis without myelopathy or radiculopathy, lumbosacral region: Secondary | ICD-10-CM | POA: Diagnosis not present

## 2019-02-20 ENCOUNTER — Other Ambulatory Visit: Payer: Self-pay | Admitting: Family Medicine

## 2019-03-06 DIAGNOSIS — G894 Chronic pain syndrome: Secondary | ICD-10-CM | POA: Diagnosis not present

## 2019-03-06 DIAGNOSIS — M47817 Spondylosis without myelopathy or radiculopathy, lumbosacral region: Secondary | ICD-10-CM | POA: Diagnosis not present

## 2019-03-06 DIAGNOSIS — E1142 Type 2 diabetes mellitus with diabetic polyneuropathy: Secondary | ICD-10-CM | POA: Diagnosis not present

## 2019-03-06 DIAGNOSIS — K5900 Constipation, unspecified: Secondary | ICD-10-CM | POA: Diagnosis not present

## 2019-03-09 ENCOUNTER — Ambulatory Visit (INDEPENDENT_AMBULATORY_CARE_PROVIDER_SITE_OTHER): Payer: Medicare Other | Admitting: *Deleted

## 2019-03-09 DIAGNOSIS — Z0000 Encounter for general adult medical examination without abnormal findings: Secondary | ICD-10-CM

## 2019-03-09 NOTE — Patient Instructions (Signed)
Preventive Care 40-58 Years Old, Male Preventive care refers to lifestyle choices and visits with your health care provider that can promote health and wellness. This includes:  A yearly physical exam. This is also called an annual well check.  Regular dental and eye exams.  Immunizations.  Screening for certain conditions.  Healthy lifestyle choices, such as eating a healthy diet, getting regular exercise, not using drugs or products that contain nicotine and tobacco, and limiting alcohol use. What can I expect for my preventive care visit? Physical exam Your health care provider will check:  Height and weight. These may be used to calculate body mass index (BMI), which is a measurement that tells if you are at a healthy weight.  Heart rate and blood pressure.  Your skin for abnormal spots. Counseling Your health care provider may ask you questions about:  Alcohol, tobacco, and drug use.  Emotional well-being.  Home and relationship well-being.  Sexual activity.  Eating habits.  Work and work environment. What immunizations do I need?  Influenza (flu) vaccine  This is recommended every year. Tetanus, diphtheria, and pertussis (Tdap) vaccine  You may need a Td booster every 10 years. Varicella (chickenpox) vaccine  You may need this vaccine if you have not already been vaccinated. Zoster (shingles) vaccine  You may need this after age 60. Measles, mumps, and rubella (MMR) vaccine  You may need at least one dose of MMR if you were born in 1957 or later. You may also need a second dose. Pneumococcal conjugate (PCV13) vaccine  You may need this if you have certain conditions and were not previously vaccinated. Pneumococcal polysaccharide (PPSV23) vaccine  You may need one or two doses if you smoke cigarettes or if you have certain conditions. Meningococcal conjugate (MenACWY) vaccine  You may need this if you have certain conditions. Hepatitis A vaccine   You may need this if you have certain conditions or if you travel or work in places where you may be exposed to hepatitis A. Hepatitis B vaccine  You may need this if you have certain conditions or if you travel or work in places where you may be exposed to hepatitis B. Haemophilus influenzae type b (Hib) vaccine  You may need this if you have certain risk factors. Human papillomavirus (HPV) vaccine  If recommended by your health care provider, you may need three doses over 6 months. You may receive vaccines as individual doses or as more than one vaccine together in one shot (combination vaccines). Talk with your health care provider about the risks and benefits of combination vaccines. What tests do I need? Blood tests  Lipid and cholesterol levels. These may be checked every 5 years, or more frequently if you are over 50 years old.  Hepatitis C test.  Hepatitis B test. Screening  Lung cancer screening. You may have this screening every year starting at age 55 if you have a 30-pack-year history of smoking and currently smoke or have quit within the past 15 years.  Prostate cancer screening. Recommendations will vary depending on your family history and other risks.  Colorectal cancer screening. All adults should have this screening starting at age 50 and continuing until age 75. Your health care provider may recommend screening at age 45 if you are at increased risk. You will have tests every 1-10 years, depending on your results and the type of screening test.  Diabetes screening. This is done by checking your blood sugar (glucose) after you have not eaten   for a while (fasting). You may have this done every 1-3 years.  Sexually transmitted disease (STD) testing. Follow these instructions at home: Eating and drinking  Eat a diet that includes fresh fruits and vegetables, whole grains, lean protein, and low-fat dairy products.  Take vitamin and mineral supplements as recommended  by your health care provider.  Do not drink alcohol if your health care provider tells you not to drink.  If you drink alcohol: ? Limit how much you have to 0-2 drinks a day. ? Be aware of how much alcohol is in your drink. In the [place] one drink equals one 12 oz bottle of beer (355 mL), one 5 oz glass of wine (148 mL), or one 1 oz glass of hard liquor (44 mL). Lifestyle  Take daily care of your teeth and gums.  Stay active. Exercise for at least 30 minutes on 5 or more days each week.  Do not use any products that contain nicotine or tobacco, such as cigarettes, e-cigarettes, and chewing tobacco. If you need help quitting, ask your health care provider.  If you are sexually active, practice safe sex. Use a condom or other form of protection to prevent STIs (sexually transmitted infections).  Talk with your health care provider about taking a low-dose aspirin every day starting at age 33. What's next?  Go to your health care provider once a year for a well check visit.  Ask your health care provider how often you should have your eyes and teeth checked.  Stay up to date on all vaccines. This information is not intended to replace advice given to you by your health care provider. Make sure you discuss any questions you have with your health care provider. Document Released: 08/01/2015 Document Revised: 06/29/2018 Document Reviewed: 06/29/2018 Elsevier Patient Education  2020 [employer].

## 2019-03-09 NOTE — Progress Notes (Signed)
MEDICARE ANNUAL WELLNESS VISIT  03/09/2019  Telephone Visit Disclaimer This Medicare AWV was conducted by telephone due to national recommendations for restrictions regarding the COVID-19 Pandemic (e.g. social distancing).  I verified, using two identifiers, that I am speaking with Joseph [name] or their authorized healthcare agent. I discussed the limitations, risks, security, and privacy concerns of performing an evaluation and management service by telephone and the potential availability of an in-person appointment in the future. The patient expressed understanding and agreed to proceed.   Subjective:  Joseph [name] is a 58 y.o. male patient of Dettinger, Fransisca Kaufmann, MD who had a Medicare Annual Wellness Visit today via telephone. Joseph [name] is Disabled and lives with their spouse. he has 2 stepchildren. he reports that he is socially active and does interact with friends/family regularly. he is minimally physically active and enjoys fishing.  Patient Care Team: Dettinger, Fransisca Kaufmann, MD as PCP - General (Family Medicine) Margaretha Sheffield, MD as Referring Physician (Physical Medicine and Rehabilitation) Services, [facility] as Referring Physician (Psychiatry) Festus Aloe, MD as Consulting Physician (Urology)  Advanced Directives 03/09/2019 03/07/2018 02/07/2017 11/23/2016 05/14/2015 10/30/2011  Does Patient Have a Medical Advance Directive? Yes Yes No Yes No;Yes Patient does not have advance directive  Type of Scientist, forensic Power of [place];Living will [place];Living will - [place];Living will [place];Living will -  Does patient want to make changes to medical advance directive? No - Patient declined No - Patient declined - No - Patient declined No - Patient declined -  Copy of [place] in Chart? No - copy requested No - copy requested - No - copy requested - -  Would patient like  information on creating a medical advance directive? - - No - Patient declined - No - patient declined information -  Pre-existing out of facility DNR order (yellow form or pink MOST form) - - - - - No    Hospital Utilization Over the Past 12 Months: # of hospitalizations or ER visits: 0 # of surgeries: 0  Review of Systems    Patient reports that his overall health is worse compared to last year.  Patient Reported Readings (BP, Pulse, CBG, Weight, etc) none  Review of Systems: History obtained from chart review  All other systems negative.  Pain Assessment Pain : 0-10 Pain Score: 7  Pain Type: Chronic pain Pain Location: Back Pain Orientation: Lower Pain Radiating Towards: down right leg Pain Descriptors / Indicators: Aching Pain Onset: Other (comment)(several years) Pain Frequency: Constant Pain Relieving Factors: pain medication Effect of Pain on Daily Activities: somewhat  Pain Relieving Factors: pain medication  Current Medications & Allergies (verified) Allergies as of 03/09/2019   No Known Allergies     Medication List       Accurate as of March 09, 2019  2:54 PM. If you have any questions, ask your nurse or doctor.        STOP taking these medications   tiotropium 18 MCG inhalation capsule Commonly known as: Spiriva HandiHaler   Ventolin HFA 108 (90 Base) MCG/ACT inhaler Generic drug: albuterol     TAKE these medications   budesonide-formoterol 160-4.5 MCG/ACT inhaler Commonly known as: SYMBICORT Inhale 2 puffs into the lungs 2 (two) times daily.   buPROPion 150 MG 24 hr tablet Commonly known as: WELLBUTRIN XL Take 1 tablet (150 mg total) by mouth daily.   doxepin 10 MG capsule Commonly known as:  SINEQUAN Take 1 capsule (10 mg total) by mouth at bedtime.   fenofibrate 160 MG tablet TAKE 1 TABLET BY MOUTH EVERY DAY   Fluticasone-Umeclidin-Vilant 100-62.5-25 MCG/INH Aepb Commonly known as: Trelegy Ellipta Inhale 1 puff into the lungs  daily.   levothyroxine 200 MCG tablet Commonly known as: SYNTHROID TAKE 1 TABLET (200 MCG TOTAL) BY MOUTH DAILY BEFORE BREAKFAST.   morphine 30 MG 12 hr tablet Commonly known as: MS CONTIN Take 30 mg by mouth every 8 (eight) hours.   oxyCODONE-acetaminophen 7.5-325 MG tablet Commonly known as: PERCOCET TAKE 1 TABLET BY MOUTH EVERY FOUR HOURS AS NEEDED FOR PAIN   pantoprazole 20 MG tablet Commonly known as: PROTONIX Take 1 tablet (20 mg total) by mouth daily.   QUEtiapine 200 MG tablet Commonly known as: SEROQUEL Take 1 tablet (200 mg total) by mouth at bedtime.   rosuvastatin 10 MG tablet Commonly known as: CRESTOR Take 1 tablet (10 mg total) by mouth daily.   sertraline 100 MG tablet Commonly known as: ZOLOFT Take 1.5 tablets (150 mg total) by mouth daily.   triamcinolone cream 0.1 % Commonly known as: KENALOG Apply 1 application topically 2 (two) times daily.   Vitamin D3 125 MCG (5000 UT) Caps Take 5,000 Units by mouth.       History (reviewed): Past Medical History:  Diagnosis Date  . Anxiety   . COPD (chronic obstructive pulmonary disease) ([place])    AB clinical dx; HFA 75% 02/28/10 > 90% Sept 21, 2011  . Depression   . Diabetes mellitus   . Hyperlipidemia   . Weight gain    After quitting smoking in 2005   Past Surgical History:  Procedure Laterality Date  . KNEE ARTHROSCOPY     right  . 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 Emphysema Sister   . Prostate cancer Maternal Grandfather   . Clotting disorder Maternal Grandfather   . Cancer Maternal Grandmother        brain  . Depression Maternal Uncle   . Atopy Neg Hx    Social History   Socioeconomic History  . Marital status: Married    Spouse name: Joseph [name]  . Number of children: 2  . Years of education: 84  . Highest education level: High school graduate  Occupational History  . Occupation: disability  Social Needs  .  Financial resource strain: Hard  . Food insecurity    Worry: Never true    Inability: Never true  . Transportation needs    Medical: No    Non-medical: No  Tobacco Use  . Smoking status: Former Smoker    Packs/day: 3.00    Years: 30.00    Pack years: 90.00    Quit date: 07/20/2003    Years since quitting: 15.6  . Smokeless tobacco: Never Used  Substance and Sexual Activity  . Alcohol use: Not Currently    Comment: per week  . Drug use: No  . Sexual activity: Yes  Lifestyle  . Physical activity    Days per week: 7 days    Minutes per session: 50 min  . Stress: Not at all  Relationships  . Social Herbalist on phone: Never    Gets together: Once a week    Attends religious service: Never    Active member of club or organization: No    Attends meetings of clubs or organizations: Never  Relationship status: Married  Other Topics Concern  . Not on file  Social History Narrative   Married with children    Activities of Daily Living In your present state of health, do you have any difficulty performing the following activities: 03/09/2019  Hearing? Y  Comment ringing in ears all the time  Vision? N  Difficulty concentrating or making decisions? N  Walking or climbing stairs? N  Dressing or bathing? N  Preparing Food and eating ? N  Using the Toilet? N  In the past six months, have you accidently leaked urine? N  Do you have problems with loss of bowel control? N  Managing your Medications? N  Managing your Finances? N  Housekeeping or managing your Housekeeping? Y  Comment wife does a lot of the housekeeping  Some recent data might be hidden    Patient Education/ Literacy How often do you need to have someone help you when you read instructions, pamphlets, or other written materials from your doctor or pharmacy?: 1 - Never What is the last grade level you completed in school?: 12th grade  Exercise Current Exercise Habits: Home exercise routine, Type of  exercise: walking, Time (Minutes): 45, Frequency (Times/Week): 7, Weekly Exercise (Minutes/Week): 315, Intensity: Mild, Exercise limited by: orthopedic condition(s)  Diet Patient reports consuming 2 meals a day and 1 snack(s) a day Patient reports that his primary diet is: Regular Patient reports that she does have regular access to food.   Depression Screen PHQ 2/9 Scores 03/09/2019 01/29/2019 09/29/2018 07/31/2018 03/07/2018 01/25/2018 07/27/2017  PHQ - 2 Score 0 0 0 0 0 0 0  PHQ- 9 Score - - - - - - -     Fall Risk Fall Risk  03/09/2019 03/07/2018 01/24/2017 11/23/2016 04/14/2016  Falls in the past year? 0 No No No No  Number falls in past yr: 0 - - - -  Injury with Fall? 0 - - - -     Objective:  Joseph [name] seemed alert and oriented and he participated appropriately during our telephone visit.  Blood Pressure Weight BMI  BP Readings from Last 3 Encounters:  01/29/19 (!) 152/98  09/29/18 (!) 150/91  07/31/18 130/78   Wt Readings from Last 3 Encounters:  01/29/19 242 lb 3.2 oz (109.9 kg)  09/29/18 250 lb 9.6 oz (113.7 kg)  07/31/18 252 lb 12.8 oz (114.7 kg)   BMI Readings from Last 1 Encounters:  01/29/19 31.10 kg/m    *Unable to obtain current vital signs, weight, and BMI due to telephone visit type  Hearing/Vision  . Joseph [name] did not seem to have difficulty with hearing/understanding during the telephone conversation . Reports that he has not had a formal eye exam by an eye care professional within the past year . Reports that he has not had a formal hearing evaluation within the past year *Unable to fully assess hearing and vision during telephone visit type  Cognitive Function: 6CIT Screen 03/09/2019  What Year? 0 points  What month? 0 points  What time? 0 points  Count back from 20 0 points  Months in reverse 0 points  Repeat phrase 2 points  Total Score 2   (Normal:0-7, Significant for Dysfunction: >8)  Normal Cognitive Function Screening: Yes   Immunization &  Health Maintenance Record Immunization History  Administered Date(s) Administered  . Influenza Nasal 04/28/2012  . Influenza,inj,Quad PF,6+ Mos 06/12/2013, 04/14/2016, 07/27/2017, 06/08/2018  . Influenza-Unspecified 05/19/2009, 05/01/2010, 05/22/2015  . Pneumococcal Conjugate-13 07/27/2017  . Pneumococcal Polysaccharide-23  07/31/2018  . Pneumococcal-Unspecified 04/28/2012  . Rabies, IM 02/07/2017, 02/10/2017, 02/14/2017, 02/21/2017  . Td 12/17/2011  . Tdap 12/17/2011    Health Maintenance  Topic Date Due  . COLON CANCER SCREENING ANNUAL FOBT  09/05/2010  . COLONOSCOPY  09/05/2010  . OPHTHALMOLOGY EXAM  07/15/2018  . URINE MICROALBUMIN  01/26/2019  . INFLUENZA VACCINE  02/17/2019  . HEMOGLOBIN A1C  08/01/2019  . FOOT EXAM  01/29/2020  . TETANUS/TDAP  12/16/2021  . PNEUMOCOCCAL POLYSACCHARIDE VACCINE AGE 65-64 HIGH RISK  Completed  . Hepatitis C Screening  Completed  . HIV Screening  Completed       Assessment  This is a routine wellness examination for Joseph [name].  Health Maintenance: Due or Overdue Health Maintenance Due  Topic Date Due  . COLON CANCER SCREENING ANNUAL FOBT  09/05/2010  . COLONOSCOPY  09/05/2010  . OPHTHALMOLOGY EXAM  07/15/2018  . URINE MICROALBUMIN  01/26/2019  . INFLUENZA VACCINE  02/17/2019    Joseph [name] does not need a referral for Community Assistance: Care Management:   no Social Work:    no Prescription Assistance:  no Nutrition/Diabetes Education:  no   Plan:  Personalized Goals Goals Addressed            This Visit's Progress   . DIET - INCREASE WATER INTAKE       Try to drink 6-8 glasses of water daily.      Personalized Health Maintenance & Screening Recommendations  Colorectal cancer screening Shingles vaccine  Lung Cancer Screening Recommended: no (Low Dose CT Chest recommended if Age 30-80 years, 30 pack-year currently smoking OR have quit w/in past 15 years) Hepatitis C Screening recommended: no HIV  Screening recommended: no  Advanced Directives: Written information was not prepared per patient's request.  Referrals & Orders No orders of the defined types were placed in this encounter.   Follow-up Plan . Follow-up with Dettinger, Fransisca Kaufmann, MD as planned . Schedule your diabetic eye exam as discussed . Consider Shingles vaccine at your next visit with your PCP   I have personally reviewed and noted the following in the patient's chart:   . Medical and social history . Use of alcohol, tobacco or illicit drugs  . Current medications and supplements . Functional ability and status . Nutritional status . Physical activity . Advanced directives . List of other physicians . Hospitalizations, surgeries, and ER visits in previous 12 months . Vitals . Screenings to include cognitive, depression, and falls . Referrals and appointments  In addition, I have reviewed and discussed with Joseph [name] certain preventive protocols, quality metrics, and best practice recommendations. A written personalized care plan for preventive services as well as general preventive health recommendations is available and can be mailed to the patient at his request.      Marylin Crosby, LPN  [id number]
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
## 2019-03-15 ENCOUNTER — Ambulatory Visit (HOSPITAL_COMMUNITY): Payer: Medicare Other | Admitting: Psychiatry

## 2019-03-15 ENCOUNTER — Other Ambulatory Visit: Payer: Self-pay

## 2019-03-15 ENCOUNTER — Telehealth (HOSPITAL_COMMUNITY): Payer: Self-pay | Admitting: Psychiatry

## 2019-03-15 NOTE — Telephone Encounter (Signed)
It turned out that his correct phone number is [phone]. Called and left voice message to contact the office. Will plan to see him if he returns soon ; otherwise will reschedule.

## 2019-03-15 NOTE — Telephone Encounter (Signed)
Called twice for appointment this afternoon. He did not answer. Left voice message to contact the office.

## 2019-04-02 ENCOUNTER — Other Ambulatory Visit (HOSPITAL_COMMUNITY): Payer: Self-pay | Admitting: Psychiatry

## 2019-04-02 MED ORDER — BUPROPION HCL ER (XL) 150 MG PO TB24: 150.0000 mg | ORAL_TABLET | Freq: Every day | ORAL | 0 refills | Status: DC

## 2019-04-03 ENCOUNTER — Ambulatory Visit (HOSPITAL_COMMUNITY): Payer: Medicare Other | Admitting: Psychiatry

## 2019-04-09 ENCOUNTER — Ambulatory Visit (INDEPENDENT_AMBULATORY_CARE_PROVIDER_SITE_OTHER): Payer: Medicare Other

## 2019-04-09 ENCOUNTER — Other Ambulatory Visit: Payer: Self-pay

## 2019-04-09 DIAGNOSIS — E1142 Type 2 diabetes mellitus with diabetic polyneuropathy: Secondary | ICD-10-CM | POA: Diagnosis not present

## 2019-04-09 DIAGNOSIS — M47817 Spondylosis without myelopathy or radiculopathy, lumbosacral region: Secondary | ICD-10-CM | POA: Diagnosis not present

## 2019-04-09 DIAGNOSIS — Z23 Encounter for immunization: Secondary | ICD-10-CM | POA: Diagnosis not present

## 2019-04-09 DIAGNOSIS — K5900 Constipation, unspecified: Secondary | ICD-10-CM | POA: Diagnosis not present

## 2019-04-09 DIAGNOSIS — G894 Chronic pain syndrome: Secondary | ICD-10-CM | POA: Diagnosis not present
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
## 2019-04-24 ENCOUNTER — Ambulatory Visit (INDEPENDENT_AMBULATORY_CARE_PROVIDER_SITE_OTHER): Payer: Medicare Other | Admitting: Psychiatry

## 2019-04-24 ENCOUNTER — Other Ambulatory Visit: Payer: Self-pay

## 2019-04-24 ENCOUNTER — Encounter (HOSPITAL_COMMUNITY): Payer: Self-pay | Admitting: Psychiatry

## 2019-04-24 DIAGNOSIS — F330 Major depressive disorder, recurrent, mild: Secondary | ICD-10-CM | POA: Diagnosis not present

## 2019-04-24 MED ORDER — BUPROPION HCL ER (XL) 150 MG PO TB24: 150.0000 mg | ORAL_TABLET | Freq: Every day | ORAL | 0 refills | Status: DC

## 2019-04-24 MED ORDER — DOXEPIN HCL 10 MG PO CAPS: 10.0000 mg | ORAL_CAPSULE | Freq: Every day | ORAL | 1 refills | Status: DC

## 2019-04-24 NOTE — Patient Instructions (Signed)
1. Continue sertraline 150 mg daily  2.Continuebupropion150 mg at night 3.Continue doxepin 10 mg at night 4. Continue quetiapine 200 mg at night 5.Next appointment: in January

## 2019-04-30 ENCOUNTER — Other Ambulatory Visit: Payer: Self-pay

## 2019-04-30 ENCOUNTER — Ambulatory Visit (INDEPENDENT_AMBULATORY_CARE_PROVIDER_SITE_OTHER): Payer: Medicare Other | Admitting: Family Medicine

## 2019-04-30 ENCOUNTER — Encounter: Payer: Self-pay | Admitting: Family Medicine

## 2019-04-30 DIAGNOSIS — R109 Unspecified abdominal pain: Secondary | ICD-10-CM | POA: Diagnosis not present

## 2019-04-30 NOTE — Progress Notes (Signed)
Virtual Visit via telephone Note  I connected with Joseph [name] on 04/30/19 at 1736 by telephone and verified that I am speaking with the correct person using two identifiers. Joseph [name] is currently located at home and no other people are currently with her during visit. The provider, Fransisca Kaufmann , MD is located in their office at time of visit.  Call ended at 1746  I discussed the limitations, risks, security and privacy concerns of performing an evaluation and management service by telephone and the availability of in person appointments. I also discussed with the patient that there may be a patient responsible charge related to this service. The patient expressed understanding and agreed to proceed.   History and Present Illness: Patient is calling in for left sided flank pain that has been going on for a few days.  He has been using a heating pad.  Today has been worse. He denies dysuria or frequency.  He denies any bowel problems.  He is having pain just below the rib cage. He denies any increased respiratory problems. The pain comes in waves and laying down is the only thing that helps besides the heating pad.   No diagnosis found.  Outpatient Encounter Medications as of 04/30/2019  Medication Sig  . budesonide-formoterol (SYMBICORT) 160-4.5 MCG/ACT inhaler Inhale 2 puffs into the lungs 2 (two) times daily.  Derrill Memo ON 07/01/2019] buPROPion (WELLBUTRIN XL) 150 MG 24 hr tablet Take 1 tablet (150 mg total) by mouth daily.  . Cholecalciferol (VITAMIN D3) 5000 units CAPS Take 5,000 Units by mouth.  . doxepin (SINEQUAN) 10 MG capsule Take 1 capsule (10 mg total) by mouth at bedtime.  . fenofibrate 160 MG tablet TAKE 1 TABLET BY MOUTH EVERY DAY  . Fluticasone-Umeclidin-Vilant (TRELEGY ELLIPTA) 100-62.5-25 MCG/INH AEPB Inhale 1 puff into the lungs daily.  Marland Kitchen levothyroxine (SYNTHROID) 200 MCG tablet TAKE 1 TABLET (200 MCG TOTAL) BY MOUTH DAILY BEFORE BREAKFAST.  Marland Kitchen morphine  (MS CONTIN) 30 MG 12 hr tablet Take 30 mg by mouth every 8 (eight) hours.  Marland Kitchen oxyCODONE-acetaminophen (PERCOCET) 7.5-325 MG tablet TAKE 1 TABLET BY MOUTH EVERY FOUR HOURS AS NEEDED FOR PAIN  . pantoprazole (PROTONIX) 20 MG tablet Take 1 tablet (20 mg total) by mouth daily.  . QUEtiapine (SEROQUEL) 200 MG tablet Take 1 tablet (200 mg total) by mouth at bedtime.  . rosuvastatin (CRESTOR) 10 MG tablet Take 1 tablet (10 mg total) by mouth daily.  . sertraline (ZOLOFT) 100 MG tablet Take 1.5 tablets (150 mg total) by mouth daily.  Marland Kitchen triamcinolone cream (KENALOG) 0.1 % Apply 1 application topically 2 (two) times daily.   No facility-administered encounter medications on file as of 04/30/2019.     Review of Systems  Constitutional: Negative for chills and fever.  Respiratory: Negative for shortness of breath and wheezing.   Cardiovascular: Negative for chest pain and leg swelling.  Gastrointestinal: Negative for abdominal pain.  Genitourinary: Positive for flank pain. Negative for decreased urine volume, difficulty urinating, frequency, hematuria and urgency.  Musculoskeletal: Negative for back pain and gait problem.  Skin: Negative for rash.  All other systems reviewed and are negative.   Observations/Objective: Patient sounds comfortable and in no acute distress  Assessment and Plan: Problem List Items Addressed This Visit    None    Visit Diagnoses    Left flank pain    -  Primary   Relevant Orders   Urinalysis, Complete   Urine Culture  Follow Up Instructions: Patient will come leave urine tomorrow for flank pain.     I discussed the assessment and treatment plan with the patient. The patient was provided an opportunity to ask questions and all were answered. The patient agreed with the plan and demonstrated an understanding of the instructions.   The patient was advised to call back or seek an in-person evaluation if the symptoms worsen or if the condition fails to  improve as anticipated.  The above assessment and management plan was discussed with the patient. The patient verbalized understanding of and has agreed to the management plan. Patient is aware to call the clinic if symptoms persist or worsen. Patient is aware when to return to the clinic for a follow-up visit. Patient educated on when it is appropriate to go to the emergency department.    I provided 10 minutes of non-face-to-face time during this encounter.    Worthy Rancher, MD

## 2019-05-01 ENCOUNTER — Other Ambulatory Visit: Payer: Medicare Other

## 2019-05-01 ENCOUNTER — Other Ambulatory Visit: Payer: Self-pay

## 2019-05-01 ENCOUNTER — Ambulatory Visit: Payer: Medicare Other | Admitting: Physician Assistant

## 2019-05-01 DIAGNOSIS — R109 Unspecified abdominal pain: Secondary | ICD-10-CM | POA: Diagnosis not present

## 2019-05-01 LAB — URINALYSIS, COMPLETE
Bilirubin, UA: NEGATIVE
Glucose, UA: NEGATIVE
Ketones, UA: NEGATIVE
Leukocytes,UA: NEGATIVE
Nitrite, UA: NEGATIVE
Protein,UA: NEGATIVE
RBC, UA: NEGATIVE
Specific Gravity, UA: 1.025 (ref 1.005–1.030)
Urobilinogen, Ur: 0.2 mg/dL (ref 0.2–1.0)
pH, UA: 5.5 (ref 5.0–7.5)

## 2019-05-01 LAB — MICROSCOPIC EXAMINATION
Bacteria, UA: NONE SEEN
RBC, Urine: NONE SEEN /hpf (ref 0–2)
Renal Epithel, UA: NONE SEEN /hpf
WBC, UA: NONE SEEN /hpf (ref 0–5)

## 2019-05-02 ENCOUNTER — Telehealth: Payer: Self-pay | Admitting: *Deleted

## 2019-05-02 LAB — URINE CULTURE

## 2019-05-02 NOTE — Telephone Encounter (Signed)
Patient came in yesterday to leave urine and asked if we could check his bp. Patients BP 156/90.

## 2019-05-02 NOTE — Telephone Encounter (Signed)
Okay thank you

## 2019-05-08 ENCOUNTER — Telehealth: Payer: Self-pay | Admitting: Family Medicine

## 2019-05-08 DIAGNOSIS — G894 Chronic pain syndrome: Secondary | ICD-10-CM | POA: Diagnosis not present

## 2019-05-08 DIAGNOSIS — K5900 Constipation, unspecified: Secondary | ICD-10-CM | POA: Diagnosis not present

## 2019-05-08 DIAGNOSIS — E1142 Type 2 diabetes mellitus with diabetic polyneuropathy: Secondary | ICD-10-CM | POA: Diagnosis not present

## 2019-05-08 DIAGNOSIS — M47817 Spondylosis without myelopathy or radiculopathy, lumbosacral region: Secondary | ICD-10-CM | POA: Diagnosis not present

## 2019-05-08 NOTE — Telephone Encounter (Signed)
Patient states he is no longer having left mid back pain he will call us back if it happens again.

## 2019-05-08 NOTE — Telephone Encounter (Signed)
lmtcb

## 2019-05-08 NOTE — Telephone Encounter (Signed)
Patient says the last time he was here his BP was 152/98. He wants to know if he should be on a different medication.  {Patient wants to wait on response for when Dettinger is back in office.

## 2019-05-08 NOTE — Telephone Encounter (Signed)
First of all for the blood pressure does he have a way to check it at home and if he does I want him to check it daily over the next week and call me with the numbers.  As for the left back pain it is very likely that it could be muscular because we did not find anything in his urine so if it recurs in the future we may consider therapy and or a muscle relaxer

## 2019-07-30 ENCOUNTER — Ambulatory Visit (HOSPITAL_COMMUNITY): Payer: Medicare Other | Admitting: Psychiatry

## 2019-08-01 ENCOUNTER — Encounter: Payer: Self-pay | Admitting: Family Medicine

## 2019-08-01 ENCOUNTER — Ambulatory Visit (INDEPENDENT_AMBULATORY_CARE_PROVIDER_SITE_OTHER): Payer: Medicare Other | Admitting: Family Medicine

## 2019-08-01 ENCOUNTER — Other Ambulatory Visit: Payer: Self-pay

## 2019-08-01 VITALS — BP 181/90 | HR 88 | Temp 98.9°F | Ht 74.0 in | Wt 250.2 lb

## 2019-08-01 DIAGNOSIS — I10 Essential (primary) hypertension: Secondary | ICD-10-CM

## 2019-08-01 DIAGNOSIS — E1159 Type 2 diabetes mellitus with other circulatory complications: Secondary | ICD-10-CM

## 2019-08-01 DIAGNOSIS — Z794 Long term (current) use of insulin: Secondary | ICD-10-CM | POA: Diagnosis not present

## 2019-08-01 DIAGNOSIS — E039 Hypothyroidism, unspecified: Secondary | ICD-10-CM

## 2019-08-01 DIAGNOSIS — E1169 Type 2 diabetes mellitus with other specified complication: Secondary | ICD-10-CM

## 2019-08-01 DIAGNOSIS — N1831 Chronic kidney disease, stage 3a: Secondary | ICD-10-CM | POA: Diagnosis not present

## 2019-08-01 DIAGNOSIS — E0821 Diabetes mellitus due to underlying condition with diabetic nephropathy: Secondary | ICD-10-CM | POA: Diagnosis not present

## 2019-08-01 DIAGNOSIS — J439 Emphysema, unspecified: Secondary | ICD-10-CM

## 2019-08-01 DIAGNOSIS — E785 Hyperlipidemia, unspecified: Secondary | ICD-10-CM | POA: Diagnosis not present

## 2019-08-01 DIAGNOSIS — I152 Hypertension secondary to endocrine disorders: Secondary | ICD-10-CM

## 2019-08-01 LAB — BAYER DCA HB A1C WAIVED: HB A1C (BAYER DCA - WAIVED): 6.2 % (ref <7.0)

## 2019-08-01 MED ORDER — BUDESON-GLYCOPYRROL-FORMOTEROL 160-9-4.8 MCG/ACT IN AERO: 1.0000 | INHALATION_SPRAY | Freq: Two times a day (BID) | RESPIRATORY_TRACT | 3 refills | Status: DC

## 2019-08-01 MED ORDER — LISINOPRIL 20 MG PO TABS: 20.0000 mg | ORAL_TABLET | Freq: Every day | ORAL | 3 refills | Status: DC

## 2019-08-01 NOTE — Progress Notes (Signed)
BP (!) 181/90   Pulse 88   Temp 98.9 F (37.2 C) (Temporal)   Ht 6' 2"  (1.88 m)   Wt 250 lb 3.2 oz (113.5 kg)   SpO2 94%   BMI 32.12 kg/m    Subjective:   Patient ID: Joseph [name], male    DOB: 14-Jun-1961, 59 y.o.   MRN: [id number]  HPI: Joseph [name] is a 59 y.o. male presenting on 08/01/2019 for Diabetes (6 month follow up) and Hypothyroidism   HPI Type 2 diabetes mellitus Patient comes in today for recheck of his diabetes. Patient has been currently taking diet controlled. Patient is currently on an ACE inhibitor/ARB. Patient has not seen an ophthalmologist this year. Patient denies any issues with their feet.  Patient has stage III CKD and we are monitoring.  Hypertension Patient is currently on no medications and has been diet controlled, and their blood pressure today is 181/90. Patient denies any lightheadedness or dizziness. Patient denies headaches, blurred vision, chest pains, shortness of breath, or weakness. Denies any side effects from medication and is content with current medication.   Hypothyroidism recheck Patient is coming in for thyroid recheck today as well. They deny any issues with hair changes or heat or cold problems or diarrhea or constipation. They deny any chest pain or palpitations. They are currently on levothyroxine 200 micrograms   Hyperlipidemia Patient is coming in for recheck of his hyperlipidemia. The patient is currently taking Crestor and fenofibrate. They deny any issues with myalgias or history of liver damage from it. They deny any focal numbness or weakness or chest pain.   Relevant past medical, surgical, family and social history reviewed and updated as indicated. Interim medical history since our last visit reviewed. Allergies and medications reviewed and updated.  Review of Systems  Constitutional: Negative for chills and fever.  Eyes: Negative for visual disturbance.  Respiratory: Negative for shortness of breath and  wheezing.   Cardiovascular: Negative for chest pain and leg swelling.  Musculoskeletal: Negative for back pain and gait problem.  Skin: Negative for rash.  All other systems reviewed and are negative.   Per HPI unless specifically indicated above   Allergies as of 08/01/2019   No Known Allergies     Medication List       Accurate as of August 01, 2019  3:34 PM. If you have any questions, ask your nurse or doctor.        budesonide-formoterol 160-4.5 MCG/ACT inhaler Commonly known as: SYMBICORT Inhale 2 puffs into the lungs 2 (two) times daily.   buPROPion 150 MG 24 hr tablet Commonly known as: WELLBUTRIN XL Take 1 tablet (150 mg total) by mouth daily.   doxepin 10 MG capsule Commonly known as: SINEQUAN Take 1 capsule (10 mg total) by mouth at bedtime.   fenofibrate 160 MG tablet TAKE 1 TABLET BY MOUTH EVERY DAY   Fluticasone-Umeclidin-Vilant 100-62.5-25 MCG/INH Aepb Commonly known as: Trelegy Ellipta Inhale 1 puff into the lungs daily.   levothyroxine 200 MCG tablet Commonly known as: SYNTHROID TAKE 1 TABLET (200 MCG TOTAL) BY MOUTH DAILY BEFORE BREAKFAST.   morphine 30 MG 12 hr tablet Commonly known as: MS CONTIN Take 30 mg by mouth every 8 (eight) hours.   oxyCODONE-acetaminophen 7.5-325 MG tablet Commonly known as: PERCOCET TAKE 1 TABLET BY MOUTH EVERY FOUR HOURS AS NEEDED FOR PAIN   pantoprazole 20 MG tablet Commonly known as: PROTONIX Take 1 tablet (20 mg total) by mouth daily.   QUEtiapine  200 MG tablet Commonly known as: SEROQUEL Take 1 tablet (200 mg total) by mouth at bedtime.   rosuvastatin 10 MG tablet Commonly known as: CRESTOR Take 1 tablet (10 mg total) by mouth daily.   sertraline 100 MG tablet Commonly known as: ZOLOFT Take 1.5 tablets (150 mg total) by mouth daily.   triamcinolone cream 0.1 % Commonly known as: KENALOG Apply 1 application topically 2 (two) times daily.   Vitamin D3 125 MCG (5000 UT) Caps Take 5,000 Units by  mouth.        Objective:   BP (!) 181/90   Pulse 88   Temp 98.9 F (37.2 C) (Temporal)   Ht 6' 2"  (1.88 m)   Wt 250 lb 3.2 oz (113.5 kg)   SpO2 94%   BMI 32.12 kg/m   Wt Readings from Last 3 Encounters:  08/01/19 250 lb 3.2 oz (113.5 kg)  01/29/19 242 lb 3.2 oz (109.9 kg)  09/29/18 250 lb 9.6 oz (113.7 kg)    Physical Exam Vitals and nursing note reviewed.  Constitutional:      General: He is not in acute distress.    Appearance: He is well-developed. He is not diaphoretic.  HENT:     Mouth/Throat:     Comments: Open tracheostomy Eyes:     General: No scleral icterus.    Conjunctiva/sclera: Conjunctivae normal.  Neck:     Thyroid: No thyromegaly.  Cardiovascular:     Rate and Rhythm: Normal rate and regular rhythm.     Heart sounds: Normal heart sounds. No murmur.  Pulmonary:     Effort: Pulmonary effort is normal. No respiratory distress.     Breath sounds: Normal breath sounds. No wheezing.  Musculoskeletal:        General: Normal range of motion.     Cervical back: Neck supple.  Lymphadenopathy:     Cervical: No cervical adenopathy.  Skin:    General: Skin is warm and dry.     Findings: No rash.  Neurological:     Mental Status: He is alert and oriented to person, place, and time.     Coordination: Coordination normal.  Psychiatric:        Behavior: Behavior normal.     Assessment & Plan:   Problem List Items Addressed This Visit      Endocrine   Hypothyroidism   Relevant Orders   CBC with Differential/Platelet (Completed)   CMP14+EGFR (Completed)   TSH (Completed)   Diabetes mellitus with chronic kidney disease ([place])   Relevant Medications   lisinopril (ZESTRIL) 20 MG tablet   Other Relevant Orders   Microalbumin / creatinine urine ratio (Completed)   CBC with Differential/Platelet (Completed)   CMP14+EGFR (Completed)   Lipid panel (Completed)   Hyperlipidemia associated with type 2 diabetes mellitus ([place]) - Primary   Relevant  Medications   lisinopril (ZESTRIL) 20 MG tablet   Other Relevant Orders   hgba1c (Completed)   Lipid panel (Completed)    Other Visit Diagnoses    Hypertension associated with diabetes ([place])       Relevant Medications   lisinopril (ZESTRIL) 20 MG tablet   Pulmonary emphysema, unspecified emphysema type ([place])          Patient is doing stable with his medications and labs, will check blood work, his A1c looks good today.  His blood pressure is elevated and will start him on lisinopril 20 mg, he had not been on anything for blood pressure prior to this. Follow up  plan: Return in about 3 months (around 10/30/2019), or if symptoms worsen or fail to improve, for Hypertension and cholesterol recheck.  Counseling provided for all of the vaccine components Orders Placed This Encounter  Procedures  . hgba1c    Caryl Pina, MD [facility] 08/01/2019, 3:34 PM

## 2019-08-02 ENCOUNTER — Other Ambulatory Visit: Payer: Self-pay | Admitting: Family Medicine

## 2019-08-02 ENCOUNTER — Telehealth: Payer: Self-pay | Admitting: *Deleted

## 2019-08-02 DIAGNOSIS — J439 Emphysema, unspecified: Secondary | ICD-10-CM

## 2019-08-02 DIAGNOSIS — E782 Mixed hyperlipidemia: Secondary | ICD-10-CM

## 2019-08-02 LAB — LIPID PANEL
Chol/HDL Ratio: 4.2 ratio (ref 0.0–5.0)
Cholesterol, Total: 208 mg/dL — ABNORMAL HIGH (ref 100–199)
HDL: 49 mg/dL (ref >39)
LDL Chol Calc (NIH): 99 mg/dL (ref 0–99)
Triglycerides: 358 mg/dL — ABNORMAL HIGH (ref 0–149)
VLDL Cholesterol Cal: 60 mg/dL — ABNORMAL HIGH (ref 5–40)

## 2019-08-02 LAB — CBC WITH DIFFERENTIAL/PLATELET
Basophils Absolute: 0.1 10*3/uL (ref 0.0–0.2)
Basos: 1 %
EOS (ABSOLUTE): 0.1 10*3/uL (ref 0.0–0.4)
Eos: 2 %
Hematocrit: 41.5 % (ref 37.5–51.0)
Hemoglobin: 13.7 g/dL (ref 13.0–17.7)
Immature Grans (Abs): 0 10*3/uL (ref 0.0–0.1)
Immature Granulocytes: 0 %
Lymphocytes Absolute: 1.2 10*3/uL (ref 0.7–3.1)
Lymphs: 28 %
MCH: 29.7 pg (ref 26.6–33.0)
MCHC: 33 g/dL (ref 31.5–35.7)
MCV: 90 fL (ref 79–97)
Monocytes Absolute: 0.5 10*3/uL (ref 0.1–0.9)
Monocytes: 11 %
Neutrophils Absolute: 2.6 10*3/uL (ref 1.4–7.0)
Neutrophils: 58 %
Platelets: 194 10*3/uL (ref 150–450)
RBC: 4.61 x10E6/uL (ref 4.14–5.80)
RDW: 12.7 % (ref 11.6–15.4)
WBC: 4.4 10*3/uL (ref 3.4–10.8)

## 2019-08-02 LAB — CMP14+EGFR
ALT: 23 IU/L (ref 0–44)
AST: 27 IU/L (ref 0–40)
Albumin/Globulin Ratio: 1.9 (ref 1.2–2.2)
Albumin: 4.5 g/dL (ref 3.8–4.9)
Alkaline Phosphatase: 25 IU/L — ABNORMAL LOW (ref 39–117)
BUN/Creatinine Ratio: 15 (ref 9–20)
BUN: 21 mg/dL (ref 6–24)
Bilirubin Total: 0.2 mg/dL (ref 0.0–1.2)
CO2: 25 mmol/L (ref 20–29)
Calcium: 9.6 mg/dL (ref 8.7–10.2)
Chloride: 100 mmol/L (ref 96–106)
Creatinine, Ser: 1.36 mg/dL — ABNORMAL HIGH (ref 0.76–1.27)
GFR calc Af Amer: 66 mL/min/{1.73_m2} (ref >59)
GFR calc non Af Amer: 57 mL/min/{1.73_m2} — ABNORMAL LOW (ref >59)
Globulin, Total: 2.4 g/dL (ref 1.5–4.5)
Glucose: 100 mg/dL — ABNORMAL HIGH (ref 65–99)
Potassium: 4.8 mmol/L (ref 3.5–5.2)
Sodium: 137 mmol/L (ref 134–144)
Total Protein: 6.9 g/dL (ref 6.0–8.5)

## 2019-08-02 LAB — MICROALBUMIN / CREATININE URINE RATIO
Creatinine, Urine: 59.9 mg/dL
Microalb/Creat Ratio: <5 mg/g creat (ref 0–29)
Microalbumin, Urine: <3 ug/mL

## 2019-08-02 LAB — TSH: TSH: 3.53 u[IU]/mL (ref 0.450–4.500)

## 2019-08-02 MED ORDER — TUDORZA PRESSAIR 400 MCG/ACT IN AEPB: 1.0000 | INHALATION_SPRAY | Freq: Two times a day (BID) | RESPIRATORY_TRACT | 3 refills | Status: DC

## 2019-08-02 MED ORDER — ALBUTEROL SULFATE HFA 108 (90 BASE) MCG/ACT IN AERS: 2.0000 | INHALATION_SPRAY | Freq: Four times a day (QID) | RESPIRATORY_TRACT | 3 refills | Status: DC | PRN

## 2019-08-02 NOTE — Addendum Note (Signed)
Addended by: Caryl Pina on: 08/02/2019 01:50 PM   Modules accepted: Orders

## 2019-08-02 NOTE — Telephone Encounter (Signed)
Patient aware.

## 2019-08-02 NOTE — Addendum Note (Signed)
Addended by: Caryl Pina on: 08/02/2019 04:28 PM   Modules accepted: Orders

## 2019-08-02 NOTE — Telephone Encounter (Signed)
Tell him that he can call his insurance company and see if they will cover a triple therapy or how we can go about that.  That could save the money in the long run.

## 2019-08-02 NOTE — Telephone Encounter (Signed)
Please let the patient know that it looks like the triple therapy was not covered so we will have to stick with the Symbicort and I have sent a new 1 to go with the Symbicort as his second inhaler.

## 2019-08-02 NOTE — Telephone Encounter (Signed)
Fax from [place] aerosphere inhaler Not covered by insurance Alternatives not suggested Please advise

## 2019-08-02 NOTE — Telephone Encounter (Signed)
I sent the Ventolin for the patient, Ventolin is a rescue inhaler

## 2019-08-02 NOTE — Telephone Encounter (Signed)
Patient aware - states he would like for Dettinger to send in [place].  States he has had this before.

## 2019-08-09 ENCOUNTER — Ambulatory Visit (INDEPENDENT_AMBULATORY_CARE_PROVIDER_SITE_OTHER): Payer: Medicare Other | Admitting: Psychiatry

## 2019-08-09 ENCOUNTER — Other Ambulatory Visit: Payer: Self-pay

## 2019-08-09 ENCOUNTER — Encounter: Payer: Self-pay | Admitting: Psychiatry

## 2019-08-09 DIAGNOSIS — F3341 Major depressive disorder, recurrent, in partial remission: Secondary | ICD-10-CM | POA: Diagnosis not present

## 2019-08-09 MED ORDER — DOXEPIN HCL 10 MG PO CAPS: 10.0000 mg | ORAL_CAPSULE | Freq: Every day | ORAL | 1 refills | Status: DC

## 2019-08-09 MED ORDER — SERTRALINE HCL 100 MG PO TABS: 150.0000 mg | ORAL_TABLET | Freq: Every day | ORAL | 1 refills | Status: DC

## 2019-08-09 MED ORDER — QUETIAPINE FUMARATE 200 MG PO TABS: 200.0000 mg | ORAL_TABLET | Freq: Every day | ORAL | 1 refills | Status: DC

## 2019-08-09 MED ORDER — BUPROPION HCL ER (XL) 150 MG PO TB24: 150.0000 mg | ORAL_TABLET | Freq: Every day | ORAL | 1 refills | Status: DC

## 2019-08-09 NOTE — Progress Notes (Signed)
[facility] MD/PA/NP OP Progress Note  Virtual Visit via Telephone Note  I connected with Joseph [name] on 08/09/19 at  3:00 PM EST by telephone and verified that I am speaking with the correct person using two identifiers.    I discussed the limitations, risks, security and privacy concerns of performing an evaluation and management service by telephone and the availability of in person appointments. I also discussed with the patient that there may be a patient responsible charge related to this service. The patient expressed understanding and agreed to proceed.    08/09/2019 3:08 PM Joseph [name]  MRN:  [id number]  Chief Complaint:  " I am having bad cramps."  HPI: Joseph [name] is a 59 y.o. year old male with a history of depression, anxiety, history of xanax overuse,,laryngeal cancer s/p total laryngectomy in 2005,hypothyroidism, COPD, type II diabetes, hypertension,GERD who was contacted via phone for follow-up appointment. Patient reported that he is not doing well due to having bad cramps.  He informed that he was recently started on lisinopril as his blood pressure has been very high.  He informed that ever since he started taking lisinopril he has noticed frequent leg cramps and his legs are very sore.  He is going to call his primary care office soon to address this issue.  He reported his mood has been okay and feels his current medication regimen is helpful.  He would like to keep the same medications for now and denied any other concerns.  Visit Diagnosis:    ICD-10-CM   1. MDD (major depressive disorder), recurrent, in partial remission ([place])  F33.41     Past Psychiatric History: MDD  Past Medical History:  Past Medical History:  Diagnosis Date  . Anxiety   . COPD (chronic obstructive pulmonary disease) ([place])    AB clinical dx; HFA 75% 02/28/10 > 90% Sept 21, 2011  . Depression   . Diabetes mellitus   . Hyperlipidemia   . Weight gain    After quitting smoking in  2005    Past Surgical History:  Procedure Laterality Date  . KNEE ARTHROSCOPY     right  . 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 Emphysema Sister   . Prostate cancer Maternal Grandfather   . Clotting disorder Maternal Grandfather   . Cancer Maternal Grandmother        brain  . Depression Maternal Uncle   . Atopy Neg Hx     Social History:  Social History   Socioeconomic History  . Marital status: Married    Spouse name: [name]  . Number of children: 2  . Years of education: 72  . Highest education level: High school graduate  Occupational History  . Occupation: disability  Tobacco Use  . Smoking status: Former Smoker    Packs/day: 3.00    Years: 30.00    Pack years: 90.00    Quit date: 07/20/2003    Years since quitting: 16.0  . Smokeless tobacco: Never Used  Substance and Sexual Activity  . Alcohol use: Not Currently    Comment: per week  . Drug use: No  . Sexual activity: Yes  Other Topics Concern  . Not on file  Social History Narrative   Married with children   Social Determinants of Health   Financial Resource Strain: High Risk  . Difficulty  of Paying Living Expenses: Hard  Food Insecurity: No Food Insecurity  . Worried About Charity fundraiser in the Last Year: Never true  . Ran Out of Food in the Last Year: Never true  Transportation Needs: No Transportation Needs  . Lack of Transportation (Medical): No  . Lack of Transportation (Non-Medical): No  Physical Activity: Sufficiently Active  . Days of Exercise per Week: 7 days  . Minutes of Exercise per Session: 50 min  Stress: No Stress Concern Present  . Feeling of Stress : Not at all  Social Connections: Moderately Isolated  . Frequency of Communication with Friends and Family: Never  . Frequency of Social Gatherings with Friends and Family: Once a week  . Attends  Religious Services: Never  . Active Member of Clubs or Organizations: No  . Attends Archivist Meetings: Never  . Marital Status: Married    Allergies: No Known Allergies  Metabolic Disorder Labs: Lab Results  Component Value Date   HGBA1C 6.2 08/01/2019   MPG 137 (H) 10/30/2011   MPG 361 10/17/2009   No results found for: PROLACTIN Lab Results  Component Value Date   CHOL 208 (H) 08/01/2019   TRIG 358 (H) 08/01/2019   HDL 49 08/01/2019   CHOLHDL 4.2 08/01/2019   VLDL UNABLE TO CALCULATE IF TRIGLYCERIDE OVER 400 mg/dL 10/17/2009   LDLCALC 99 08/01/2019   LDLCALC 68 01/29/2019   Lab Results  Component Value Date   TSH 3.530 08/01/2019   TSH 3.160 01/29/2019    Therapeutic Level Labs: No results found for: LITHIUM No results found for: VALPROATE No components found for:  CBMZ  Current Medications: Current Outpatient Medications  Medication Sig Dispense Refill  . Aclidinium Bromide (TUDORZA PRESSAIR) 400 MCG/ACT AEPB Inhale 1 Inhaler into the lungs 2 (two) times daily. 60 each 3  . albuterol (VENTOLIN HFA) 108 (90 Base) MCG/ACT inhaler Inhale 2 puffs into the lungs every 6 (six) hours as needed for wheezing or shortness of breath. 18 g 3  . budesonide-formoterol (SYMBICORT) 160-4.5 MCG/ACT inhaler Inhale 2 puffs into the lungs 2 (two) times daily.    Marland Kitchen buPROPion (WELLBUTRIN XL) 150 MG 24 hr tablet Take 1 tablet (150 mg total) by mouth daily. 90 tablet 0  . Cholecalciferol (VITAMIN D3) 5000 units CAPS Take 5,000 Units by mouth.    . doxepin (SINEQUAN) 10 MG capsule Take 1 capsule (10 mg total) by mouth at bedtime. 90 capsule 1  . fenofibrate 160 MG tablet TAKE 1 TABLET BY MOUTH EVERY DAY 90 tablet 3  . levothyroxine (SYNTHROID) 200 MCG tablet TAKE 1 TABLET (200 MCG TOTAL) BY MOUTH DAILY BEFORE BREAKFAST. 90 tablet 3  . lisinopril (ZESTRIL) 20 MG tablet Take 1 tablet (20 mg total) by mouth daily. 90 tablet 3  . morphine (MS CONTIN) 30 MG 12 hr tablet Take 30 mg  by mouth every 8 (eight) hours.    Marland Kitchen oxyCODONE-acetaminophen (PERCOCET) 7.5-325 MG tablet TAKE 1 TABLET BY MOUTH EVERY FOUR HOURS AS NEEDED FOR PAIN    . pantoprazole (PROTONIX) 20 MG tablet Take 1 tablet (20 mg total) by mouth daily. 90 tablet 3  . QUEtiapine (SEROQUEL) 200 MG tablet Take 1 tablet (200 mg total) by mouth at bedtime. 90 tablet 3  . rosuvastatin (CRESTOR) 10 MG tablet TAKE 1 TABLET BY MOUTH EVERY DAY 90 tablet 1  . sertraline (ZOLOFT) 100 MG tablet Take 1.5 tablets (150 mg total) by mouth daily. 135 tablet 3  . triamcinolone cream (  KENALOG) 0.1 % Apply 1 application topically 2 (two) times daily. 80 g 1   No current facility-administered medications for this visit.      Psychiatric Specialty Exam: Review of Systems  There were no vitals taken for this visit.There is no height or weight on file to calculate BMI.  General Appearance: Unable to assess due to phone visit  Eye Contact: Unable to assess due to phone visit  Speech:  Normal Rate  Volume:  Normal  Mood:  In mild distress  Affect:  Congruent  Thought Process:  Goal Directed and Descriptions of Associations: Intact  Orientation:  Full (Time, Place, and Person)  Thought Content: Logical   Suicidal Thoughts:  No  Homicidal Thoughts:  No  Memory:  Recent;   Good Remote;   Good  Judgement:  Fair  Insight:  Fair  Psychomotor Activity:  Normal  Concentration:  Concentration: Good and Attention Span: Fair  Recall:  [place] of Knowledge: Good  Language: Good  Akathisia:  Negative  Handed:  Right  AIMS (if indicated): not done  Assets:  Communication Skills Desire for Improvement Financial Resources/Insurance Housing  ADL's:  Intact  Cognition: WNL  Sleep:  Fair   Screenings: Mini-Mental     Clinical Support from 03/07/2018 in [facility] from 11/23/2016 in [facility]  Total Score (max 30 points )  29  30    PHQ2-9     Office Visit from  08/01/2019 in [facility] from 03/09/2019 in [facility] Visit from 01/29/2019 in [facility] Office Visit from 09/29/2018 in [facility] Visit from 07/31/2018 in [facility]  PHQ-2 Total Score  0  0  0  0  0       Assessment and Plan: Patient noted to be in mild distress due to cramping secondary to lisinopril for hypertension.  Will be contacting his PCP.  We will continue same medications for his depression for now.  1. MDD (major depressive disorder), recurrent, in partial remission ([facility])  - buPROPion (WELLBUTRIN XL) 150 MG 24 hr tablet; Take 1 tablet (150 mg total) by mouth daily.  Dispense: 90 tablet; Refill: 1 - QUEtiapine (SEROQUEL) 200 MG tablet; Take 1 tablet (200 mg total) by mouth at bedtime.  Dispense: 90 tablet; Refill: 1 - sertraline (ZOLOFT) 100 MG tablet; Take 1.5 tablets (150 mg total) by mouth daily.  Dispense: 135 tablet; Refill: 1 - doxepin (SINEQUAN) 10 MG capsule; Take 1 capsule (10 mg total) by mouth at bedtime.  Dispense: 90 capsule; Refill: 1   Continue same medication regimen. Follow up in 3 months.   [place]ada Crane, MD 08/09/2019, 3:08 PM

## 2019-08-16 ENCOUNTER — Telehealth: Payer: Self-pay | Admitting: Family Medicine

## 2019-08-16 NOTE — Telephone Encounter (Signed)
Pt called stating that Dr Dettinger switched one of his medications and he is having issues with it. Wants to speak with nurse about it.

## 2019-08-16 NOTE — Telephone Encounter (Signed)
Have him cut it in half and try taking a half a pill and see if that does okay for him.  If not we can try some different

## 2019-08-16 NOTE — Telephone Encounter (Signed)
New BP med was causing cramping all over. He stopped it for a few days and the cramping stopped. Pls. advise

## 2019-08-16 NOTE — Telephone Encounter (Signed)
Patient aware and verbalizes understanding. 

## 2019-08-29 ENCOUNTER — Other Ambulatory Visit: Payer: Self-pay | Admitting: Family Medicine

## 2019-08-29 DIAGNOSIS — E785 Hyperlipidemia, unspecified: Secondary | ICD-10-CM

## 2019-08-29 DIAGNOSIS — E1169 Type 2 diabetes mellitus with other specified complication: Secondary | ICD-10-CM

## 2019-08-30 ENCOUNTER — Telehealth: Payer: Self-pay | Admitting: Family Medicine

## 2019-08-30 NOTE — Chronic Care Management (AMB) (Signed)
  Chronic Care Management   Note  08/30/2019 Name: Joseph [name] MRN: [id number] DOB: 03/31/1961  [name] is a 59 y.o. year old male who is a primary care patient of Dettinger, Fransisca Kaufmann, MD. I reached out to [name] by phone today in response to a referral sent by [name] health plan.     [name] was given information about Chronic Care Management services today including:  1. CCM service includes personalized support from designated clinical staff supervised by his physician, including individualized plan of care and coordination with other care providers 2. 24/7 contact phone numbers for assistance for urgent and routine care needs. 3. Service will only be billed when office clinical staff spend 20 minutes or more in a month to coordinate care. 4. Only one practitioner may furnish and bill the service in a calendar month. 5. The patient may stop CCM services at any time (effective at the end of the month) by phone call to the office staff. 6. The patient will be responsible for cost sharing (co-pay) of up to 20% of the service fee (after annual deductible is met).  Patient did not agree to enrollment in care management services and does not wish to consider at this time.  Follow up plan: The patient has been provided with contact information for the care management team and has been advised to call with any health related questions or concerns.   Noreene Larsson, [place], [place], [place] [address] Direct Dial: [phone] [email or web address] Website: [email or web address]

## 2019-09-02 ENCOUNTER — Other Ambulatory Visit: Payer: Self-pay | Admitting: Family Medicine

## 2019-09-02 DIAGNOSIS — K219 Gastro-esophageal reflux disease without esophagitis: Secondary | ICD-10-CM

## 2019-09-13 DIAGNOSIS — G894 Chronic pain syndrome: Secondary | ICD-10-CM | POA: Diagnosis not present

## 2019-09-13 DIAGNOSIS — Z79891 Long term (current) use of opiate analgesic: Secondary | ICD-10-CM | POA: Diagnosis not present

## 2019-10-22 ENCOUNTER — Other Ambulatory Visit: Payer: Self-pay | Admitting: Family Medicine

## 2019-10-22 DIAGNOSIS — F3341 Major depressive disorder, recurrent, in partial remission: Secondary | ICD-10-CM
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
## 2019-11-08 ENCOUNTER — Telehealth (HOSPITAL_COMMUNITY): Payer: Medicare Other | Admitting: Psychiatry

## 2019-11-08 DIAGNOSIS — E1142 Type 2 diabetes mellitus with diabetic polyneuropathy: Secondary | ICD-10-CM | POA: Diagnosis not present

## 2019-11-08 DIAGNOSIS — M47817 Spondylosis without myelopathy or radiculopathy, lumbosacral region: Secondary | ICD-10-CM | POA: Diagnosis not present

## 2019-11-08 DIAGNOSIS — G894 Chronic pain syndrome: Secondary | ICD-10-CM | POA: Diagnosis not present

## 2019-11-08 DIAGNOSIS — K5900 Constipation, unspecified: Secondary | ICD-10-CM | POA: Diagnosis not present

## 2019-11-12 ENCOUNTER — Telehealth (INDEPENDENT_AMBULATORY_CARE_PROVIDER_SITE_OTHER): Payer: Medicare Other | Admitting: Psychiatry

## 2019-11-12 ENCOUNTER — Encounter (HOSPITAL_COMMUNITY): Payer: Self-pay | Admitting: Psychiatry

## 2019-11-12 ENCOUNTER — Other Ambulatory Visit: Payer: Self-pay

## 2019-11-12 DIAGNOSIS — F3342 Major depressive disorder, recurrent, in full remission: Secondary | ICD-10-CM
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
## 2019-11-12 NOTE — Patient Instructions (Signed)
1. Continue sertraline 150 mg daily  2.Continuebupropion150 mg at night 3.Continue doxepin 10 mg at night 4. Decrease quetiapine 100 mg at night 5.Next appointment: 6/22 at 1:20

## 2019-12-06 DIAGNOSIS — G894 Chronic pain syndrome: Secondary | ICD-10-CM | POA: Diagnosis not present

## 2019-12-06 DIAGNOSIS — E1142 Type 2 diabetes mellitus with diabetic polyneuropathy: Secondary | ICD-10-CM | POA: Diagnosis not present

## 2019-12-06 DIAGNOSIS — K5900 Constipation, unspecified: Secondary | ICD-10-CM | POA: Diagnosis not present

## 2019-12-06 DIAGNOSIS — M47817 Spondylosis without myelopathy or radiculopathy, lumbosacral region: Secondary | ICD-10-CM | POA: Diagnosis not present

## 2020-01-03 DIAGNOSIS — E1142 Type 2 diabetes mellitus with diabetic polyneuropathy: Secondary | ICD-10-CM | POA: Diagnosis not present

## 2020-01-03 DIAGNOSIS — K5900 Constipation, unspecified: Secondary | ICD-10-CM | POA: Diagnosis not present

## 2020-01-03 DIAGNOSIS — M47817 Spondylosis without myelopathy or radiculopathy, lumbosacral region: Secondary | ICD-10-CM | POA: Diagnosis not present

## 2020-01-03 DIAGNOSIS — G894 Chronic pain syndrome: Secondary | ICD-10-CM | POA: Diagnosis not present
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
## 2020-01-08 ENCOUNTER — Encounter (HOSPITAL_COMMUNITY): Payer: Self-pay | Admitting: Psychiatry

## 2020-01-08 ENCOUNTER — Other Ambulatory Visit: Payer: Self-pay

## 2020-01-08 ENCOUNTER — Telehealth (INDEPENDENT_AMBULATORY_CARE_PROVIDER_SITE_OTHER): Payer: Medicare Other | Admitting: Psychiatry

## 2020-01-08 DIAGNOSIS — F330 Major depressive disorder, recurrent, mild: Secondary | ICD-10-CM | POA: Diagnosis not present

## 2020-01-08 MED ORDER — DOXEPIN HCL 10 MG PO CAPS: 10.0000 mg | ORAL_CAPSULE | Freq: Every evening | ORAL | 0 refills | Status: DC | PRN

## 2020-01-08 MED ORDER — SERTRALINE HCL 100 MG PO TABS: 150.0000 mg | ORAL_TABLET | Freq: Every day | ORAL | 0 refills | Status: DC

## 2020-01-08 MED ORDER — BUPROPION HCL ER (XL) 150 MG PO TB24: 150.0000 mg | ORAL_TABLET | Freq: Every day | ORAL | 0 refills | Status: DC

## 2020-01-08 NOTE — Patient Instructions (Signed)
1. Continue sertraline 150 mg daily  2.Continuebupropion150 mg at night  3.Continue doxepin 10 mg at nightas needed for sleep 4. Continue quetiapine 100 mg at night 5.Next appointment: 9/14 at 1:40

## 2020-01-31 DIAGNOSIS — K5900 Constipation, unspecified: Secondary | ICD-10-CM | POA: Diagnosis not present

## 2020-01-31 DIAGNOSIS — G894 Chronic pain syndrome: Secondary | ICD-10-CM | POA: Diagnosis not present

## 2020-01-31 DIAGNOSIS — E1142 Type 2 diabetes mellitus with diabetic polyneuropathy: Secondary | ICD-10-CM | POA: Diagnosis not present

## 2020-01-31 DIAGNOSIS — M47817 Spondylosis without myelopathy or radiculopathy, lumbosacral region: Secondary | ICD-10-CM | POA: Diagnosis not present

## 2020-02-28 ENCOUNTER — Other Ambulatory Visit: Payer: Self-pay | Admitting: Family Medicine

## 2020-02-28 DIAGNOSIS — E785 Hyperlipidemia, unspecified: Secondary | ICD-10-CM

## 2020-02-28 DIAGNOSIS — E1169 Type 2 diabetes mellitus with other specified complication: Secondary | ICD-10-CM

## 2020-03-01 ENCOUNTER — Other Ambulatory Visit: Payer: Self-pay | Admitting: Family Medicine

## 2020-03-14 ENCOUNTER — Other Ambulatory Visit: Payer: Self-pay
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
## 2020-04-01 ENCOUNTER — Other Ambulatory Visit: Payer: Self-pay

## 2020-04-01 ENCOUNTER — Telehealth (INDEPENDENT_AMBULATORY_CARE_PROVIDER_SITE_OTHER): Payer: Medicare Other | Admitting: Psychiatry

## 2020-04-01 ENCOUNTER — Encounter (HOSPITAL_COMMUNITY): Payer: Self-pay | Admitting: Psychiatry

## 2020-04-01 DIAGNOSIS — F3341 Major depressive disorder, recurrent, in partial remission: Secondary | ICD-10-CM | POA: Diagnosis not present

## 2020-04-01 DIAGNOSIS — F330 Major depressive disorder, recurrent, mild: Secondary | ICD-10-CM

## 2020-04-01 MED ORDER — SERTRALINE HCL 100 MG PO TABS: 150.0000 mg | ORAL_TABLET | Freq: Every day | ORAL | 0 refills | Status: DC

## 2020-04-01 MED ORDER — BUPROPION HCL ER (XL) 150 MG PO TB24: 150.0000 mg | ORAL_TABLET | Freq: Every day | ORAL | 0 refills | Status: DC

## 2020-04-01 MED ORDER — QUETIAPINE FUMARATE 200 MG PO TABS: 200.0000 mg | ORAL_TABLET | Freq: Every day | ORAL | 1 refills | Status: DC

## 2020-04-03 DIAGNOSIS — G894 Chronic pain syndrome: Secondary | ICD-10-CM | POA: Diagnosis not present

## 2020-04-03 DIAGNOSIS — M47817 Spondylosis without myelopathy or radiculopathy, lumbosacral region: Secondary | ICD-10-CM | POA: Diagnosis not present

## 2020-04-03 DIAGNOSIS — K5900 Constipation, unspecified: Secondary | ICD-10-CM | POA: Diagnosis not present

## 2020-04-03 DIAGNOSIS — E1142 Type 2 diabetes mellitus with diabetic polyneuropathy: Secondary | ICD-10-CM | POA: Diagnosis not present

## 2020-05-01 DIAGNOSIS — K5900 Constipation, unspecified: Secondary | ICD-10-CM | POA: Diagnosis not present

## 2020-05-01 DIAGNOSIS — M47817 Spondylosis without myelopathy or radiculopathy, lumbosacral region: Secondary | ICD-10-CM | POA: Diagnosis not present

## 2020-05-01 DIAGNOSIS — G894 Chronic pain syndrome: Secondary | ICD-10-CM | POA: Diagnosis not present

## 2020-05-01 DIAGNOSIS — E1142 Type 2 diabetes mellitus with diabetic polyneuropathy: Secondary | ICD-10-CM | POA: Diagnosis not present

## 2020-05-25 ENCOUNTER — Other Ambulatory Visit: Payer: Self-pay | Admitting: Family Medicine

## 2020-05-25 DIAGNOSIS — E1169 Type 2 diabetes mellitus with other specified complication: Secondary | ICD-10-CM

## 2020-06-02 ENCOUNTER — Other Ambulatory Visit: Payer: Self-pay | Admitting: Family Medicine

## 2020-06-02 DIAGNOSIS — E1169 Type 2 diabetes mellitus with other specified complication: Secondary | ICD-10-CM

## 2020-06-02 DIAGNOSIS — E785 Hyperlipidemia, unspecified: Secondary | ICD-10-CM
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
## 2020-07-01 ENCOUNTER — Encounter: Payer: Self-pay | Admitting: Psychiatry

## 2020-07-01 ENCOUNTER — Telehealth (HOSPITAL_COMMUNITY): Payer: Medicare Other | Admitting: Psychiatry

## 2020-07-01 ENCOUNTER — Other Ambulatory Visit: Payer: Self-pay

## 2020-07-01 ENCOUNTER — Telehealth (INDEPENDENT_AMBULATORY_CARE_PROVIDER_SITE_OTHER): Payer: Medicare Other | Admitting: Psychiatry

## 2020-07-01 DIAGNOSIS — F330 Major depressive disorder, recurrent, mild: Secondary | ICD-10-CM | POA: Diagnosis not present

## 2020-07-01 MED ORDER — BUPROPION HCL ER (XL) 150 MG PO TB24: 150.0000 mg | ORAL_TABLET | Freq: Every day | ORAL | 0 refills | Status: DC

## 2020-07-01 MED ORDER — ARIPIPRAZOLE 2 MG PO TABS: 2.0000 mg | ORAL_TABLET | Freq: Every day | ORAL | 1 refills | Status: DC

## 2020-07-01 MED ORDER — SERTRALINE HCL 100 MG PO TABS: 100.0000 mg | ORAL_TABLET | Freq: Every day | ORAL | 0 refills | Status: DC

## 2020-07-03 DIAGNOSIS — E1142 Type 2 diabetes mellitus with diabetic polyneuropathy: Secondary | ICD-10-CM | POA: Diagnosis not present

## 2020-07-03 DIAGNOSIS — K5900 Constipation, unspecified: Secondary | ICD-10-CM | POA: Diagnosis not present

## 2020-07-03 DIAGNOSIS — M47817 Spondylosis without myelopathy or radiculopathy, lumbosacral region: Secondary | ICD-10-CM | POA: Diagnosis not present

## 2020-07-03 DIAGNOSIS — G894 Chronic pain syndrome: Secondary | ICD-10-CM | POA: Diagnosis not present

## 2020-07-25 ENCOUNTER — Telehealth: Payer: Self-pay

## 2020-07-25 ENCOUNTER — Other Ambulatory Visit: Payer: Self-pay | Admitting: Psychiatry

## 2020-07-25 DIAGNOSIS — F330 Major depressive disorder, recurrent, mild: Secondary | ICD-10-CM

## 2020-07-25 MED ORDER — DOXEPIN HCL 10 MG PO CAPS: 10.0000 mg | ORAL_CAPSULE | Freq: Every evening | ORAL | 0 refills | Status: DC | PRN

## 2020-07-25 NOTE — Telephone Encounter (Signed)
Medication refill - Fax received from pt's [facility] for a refill of Doxepin he takes PRN.  Pt. last evaluated 07/01/20 and was continued at that time.  Last order 02/06/20 for #90.

## 2020-07-25 NOTE — Telephone Encounter (Deleted)
Your message disappeared for some reason, but I ordered refill of doxepine. Fyi.

## 2020-07-31 DIAGNOSIS — M47817 Spondylosis without myelopathy or radiculopathy, lumbosacral region: Secondary | ICD-10-CM | POA: Diagnosis not present

## 2020-07-31 DIAGNOSIS — G894 Chronic pain syndrome: Secondary | ICD-10-CM | POA: Diagnosis not present

## 2020-07-31 DIAGNOSIS — K5900 Constipation, unspecified: Secondary | ICD-10-CM | POA: Diagnosis not present

## 2020-07-31 DIAGNOSIS — E1142 Type 2 diabetes mellitus with diabetic polyneuropathy: Secondary | ICD-10-CM | POA: Diagnosis not present

## 2020-08-07 ENCOUNTER — Emergency Department (HOSPITAL_COMMUNITY): Payer: Medicare Other

## 2020-08-07 ENCOUNTER — Encounter (HOSPITAL_COMMUNITY): Payer: Self-pay | Admitting: *Deleted

## 2020-08-07 ENCOUNTER — Emergency Department (HOSPITAL_COMMUNITY)
Admission: EM | Admit: 2020-08-07 | Discharge: 2020-08-07 | Disposition: A | Discharge status: Home / Self Care | Payer: Medicare Other | Attending: Emergency Medicine | Admitting: Emergency Medicine

## 2020-08-07 ENCOUNTER — Other Ambulatory Visit: Payer: Self-pay

## 2020-08-07 DIAGNOSIS — R1012 Left upper quadrant pain: Secondary | ICD-10-CM

## 2020-08-07 DIAGNOSIS — Z20822 Contact with and (suspected) exposure to covid-19: Secondary | ICD-10-CM | POA: Insufficient documentation

## 2020-08-07 DIAGNOSIS — Z87891 Personal history of nicotine dependence: Secondary | ICD-10-CM | POA: Insufficient documentation

## 2020-08-07 DIAGNOSIS — M549 Dorsalgia, unspecified: Secondary | ICD-10-CM | POA: Diagnosis not present

## 2020-08-07 DIAGNOSIS — N62 Hypertrophy of breast: Secondary | ICD-10-CM | POA: Diagnosis not present

## 2020-08-07 DIAGNOSIS — J449 Chronic obstructive pulmonary disease, unspecified: Secondary | ICD-10-CM | POA: Insufficient documentation

## 2020-08-07 DIAGNOSIS — R9431 Abnormal electrocardiogram [ECG] [EKG]: Secondary | ICD-10-CM | POA: Diagnosis not present

## 2020-08-07 DIAGNOSIS — E039 Hypothyroidism, unspecified: Secondary | ICD-10-CM | POA: Insufficient documentation

## 2020-08-07 DIAGNOSIS — N644 Mastodynia: Secondary | ICD-10-CM | POA: Diagnosis not present

## 2020-08-07 DIAGNOSIS — R1031 Right lower quadrant pain: Secondary | ICD-10-CM | POA: Diagnosis not present

## 2020-08-07 DIAGNOSIS — R109 Unspecified abdominal pain: Secondary | ICD-10-CM | POA: Diagnosis not present

## 2020-08-07 DIAGNOSIS — Z79899 Other long term (current) drug therapy: Secondary | ICD-10-CM | POA: Insufficient documentation

## 2020-08-07 DIAGNOSIS — R079 Chest pain, unspecified: Secondary | ICD-10-CM | POA: Diagnosis not present

## 2020-08-07 DIAGNOSIS — R1033 Periumbilical pain: Secondary | ICD-10-CM | POA: Diagnosis not present

## 2020-08-07 DIAGNOSIS — R1032 Left lower quadrant pain: Secondary | ICD-10-CM | POA: Diagnosis not present

## 2020-08-07 DIAGNOSIS — J432 Centrilobular emphysema: Secondary | ICD-10-CM | POA: Diagnosis not present

## 2020-08-07 DIAGNOSIS — E1122 Type 2 diabetes mellitus with diabetic chronic kidney disease: Secondary | ICD-10-CM | POA: Insufficient documentation

## 2020-08-07 DIAGNOSIS — R1013 Epigastric pain: Secondary | ICD-10-CM | POA: Diagnosis not present

## 2020-08-07 DIAGNOSIS — N189 Chronic kidney disease, unspecified: Secondary | ICD-10-CM | POA: Diagnosis not present

## 2020-08-07 DIAGNOSIS — K8689 Other specified diseases of pancreas: Secondary | ICD-10-CM | POA: Diagnosis not present

## 2020-08-07 LAB — COMPREHENSIVE METABOLIC PANEL
ALT: 48 U/L — ABNORMAL HIGH (ref 0–44)
AST: 35 U/L (ref 15–41)
Albumin: 4.2 g/dL (ref 3.5–5.0)
Alkaline Phosphatase: 28 U/L — ABNORMAL LOW (ref 38–126)
Anion gap: 8 (ref 5–15)
BUN: 21 mg/dL — ABNORMAL HIGH (ref 6–20)
CO2: 26 mmol/L (ref 22–32)
Calcium: 9.9 mg/dL (ref 8.9–10.3)
Chloride: 100 mmol/L (ref 98–111)
Creatinine, Ser: 1.24 mg/dL (ref 0.61–1.24)
GFR, Estimated: >60 mL/min (ref >60)
Glucose, Bld: 105 mg/dL — ABNORMAL HIGH (ref 70–99)
Potassium: 4.2 mmol/L (ref 3.5–5.1)
Sodium: 134 mmol/L — ABNORMAL LOW (ref 135–145)
Total Bilirubin: 0.5 mg/dL (ref 0.3–1.2)
Total Protein: 7.9 g/dL (ref 6.5–8.1)

## 2020-08-07 LAB — URINALYSIS, ROUTINE W REFLEX MICROSCOPIC
Bilirubin Urine: NEGATIVE
Glucose, UA: NEGATIVE mg/dL
Hgb urine dipstick: NEGATIVE
Ketones, ur: NEGATIVE mg/dL
Leukocytes,Ua: NEGATIVE
Nitrite: NEGATIVE
Protein, ur: NEGATIVE mg/dL
Specific Gravity, Urine: 1.012 (ref 1.005–1.030)
pH: 6 (ref 5.0–8.0)

## 2020-08-07 LAB — CBC WITH DIFFERENTIAL/PLATELET
Abs Immature Granulocytes: 0.08 10*3/uL — ABNORMAL HIGH (ref 0.00–0.07)
Basophils Absolute: 0.1 10*3/uL (ref 0.0–0.1)
Basophils Relative: 1 %
Eosinophils Absolute: 0.1 10*3/uL (ref 0.0–0.5)
Eosinophils Relative: 1 %
HCT: 47 % (ref 39.0–52.0)
Hemoglobin: 15.7 g/dL (ref 13.0–17.0)
Immature Granulocytes: 1 %
Lymphocytes Relative: 22 %
Lymphs Abs: 1.5 10*3/uL (ref 0.7–4.0)
MCH: 30.4 pg (ref 26.0–34.0)
MCHC: 33.4 g/dL (ref 30.0–36.0)
MCV: 91.1 fL (ref 80.0–100.0)
Monocytes Absolute: 0.7 10*3/uL (ref 0.1–1.0)
Monocytes Relative: 9 %
Neutro Abs: 4.6 10*3/uL (ref 1.7–7.7)
Neutrophils Relative %: 66 %
Platelets: 180 10*3/uL (ref 150–400)
RBC: 5.16 MIL/uL (ref 4.22–5.81)
RDW: 12.8 % (ref 11.5–15.5)
WBC: 7 10*3/uL (ref 4.0–10.5)
nRBC: 0 % (ref 0.0–0.2)

## 2020-08-07 LAB — LACTIC ACID, PLASMA
Lactic Acid, Venous: 1.1 mmol/L (ref 0.5–1.9)
Lactic Acid, Venous: 0.9 mmol/L (ref 0.5–1.9)

## 2020-08-07 LAB — LIPASE, BLOOD: Lipase: 26 U/L (ref 11–51)

## 2020-08-07 LAB — TROPONIN I (HIGH SENSITIVITY)
Troponin I (High Sensitivity): 7 ng/L (ref <18)
Troponin I (High Sensitivity): 11 ng/L (ref <18)

## 2020-08-07 LAB — SARS CORONAVIRUS 2 BY RT PCR (HOSPITAL ORDER, PERFORMED IN ~~LOC~~ HOSPITAL LAB): SARS Coronavirus 2: NEGATIVE

## 2020-08-07 MED ORDER — LIDOCAINE VISCOUS HCL 2 % MT SOLN
15.0000 mL | Freq: Once | OROMUCOSAL | Status: AC
  Administered 2020-08-07: 15 mL via ORAL
  Filled 2020-08-07: qty 15

## 2020-08-07 MED ORDER — ALUM & MAG HYDROXIDE-SIMETH 200-200-20 MG/5ML PO SUSP
30.0000 mL | Freq: Once | ORAL | Status: AC
  Administered 2020-08-07: 30 mL via ORAL
  Filled 2020-08-07: qty 30

## 2020-08-07 MED ORDER — HYDROMORPHONE HCL 1 MG/ML IJ SOLN
1.0000 mg | Freq: Once | INTRAMUSCULAR | Status: AC
  Administered 2020-08-07: 1 mg via INTRAVENOUS
  Filled 2020-08-07: qty 1

## 2020-08-07 MED ORDER — SODIUM CHLORIDE 0.9 % IV BOLUS
1000.0000 mL | Freq: Once | INTRAVENOUS | Status: AC
  Administered 2020-08-07: 1000 mL via INTRAVENOUS

## 2020-08-07 MED ORDER — KETOROLAC TROMETHAMINE 30 MG/ML IJ SOLN
30.0000 mg | Freq: Once | INTRAMUSCULAR | Status: AC
  Administered 2020-08-07: 30 mg via INTRAVENOUS
  Filled 2020-08-07: qty 1

## 2020-08-07 MED ORDER — IOHEXOL 350 MG/ML SOLN
100.0000 mL | Freq: Once | INTRAVENOUS | Status: AC | PRN
  Administered 2020-08-07: 100 mL via INTRAVENOUS

## 2020-08-07 MED ORDER — ONDANSETRON HCL 4 MG/2ML IJ SOLN
4.0000 mg | Freq: Once | INTRAMUSCULAR | Status: AC
  Administered 2020-08-07: 4 mg via INTRAVENOUS
  Filled 2020-08-07: qty 2

## 2020-08-07 MED ORDER — MORPHINE SULFATE (PF) 4 MG/ML IV SOLN
4.0000 mg | Freq: Once | INTRAVENOUS | Status: AC
Start: 2020-08-07 — End: 2020-08-07
  Administered 2020-08-07: 4 mg via INTRAVENOUS
  Filled 2020-08-07: qty 1

## 2020-08-07 NOTE — ED Triage Notes (Signed)
Pain in right lower quadrant x 1 week getting worse

## 2020-08-07 NOTE — Discharge Instructions (Addendum)
Follow up with your Physician for recheck. Continue current pain medications.  Have your Physician schedule follow up of possible lung nodule

## 2020-08-07 NOTE — ED Provider Notes (Signed)
[name] care assumed at 9pm.  [name] second troponin is normal.  Dr. Rogene Houston in to see pt    Joseph [name] 08/07/20 [id number]    Fredia Sorrow, MD 08/11/20 [phone]

## 2020-08-07 NOTE — ED Notes (Signed)
Pt alert oriented and ambulatory. Pt rec'd d/c papers.

## 2020-08-07 NOTE — ED Provider Notes (Signed)
[facility] EMERGENCY DEPARTMENT Provider Note   CSN: [id number] Arrival date & time: 08/07/20  1507    History Chief Complaint  Patient presents with  . Abdominal Pain    Joseph [name] is a 60 y.o. male with past medical history significant for COPD, DM, CKD who presents for evaluation of flank pain, CP and abdominal pain, began 1 week ago. Worse with palpation. No hx of stones. Multiple episodes of NBNB emesis this morning. Normal BM. Denies fever, chills, SOB, hemoptysis, testicular pain, dysuria,hematuria. No hx of AAA, dissection. No hx of cardiac disease. 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 Has not taken anything for pain. Rates pain a 10/10. Denies additional aggravating or alleviating factors.  History obtained from patient and past medical records. No interpretor was used.  HPI     Past Medical History:  Diagnosis Date  . Anxiety   . COPD (chronic obstructive pulmonary disease) ([place])    AB clinical dx; HFA 75% 02/28/10 > 90% Sept 21, 2011  . Depression   . Diabetes mellitus   . Hyperlipidemia   . Weight gain    After quitting smoking in 2005    Patient Active Problem List   Diagnosis Date Noted  . MDD (major depressive disorder), recurrent episode, moderate ([place]) 01/09/2018  . Hyperlipidemia associated with type 2 diabetes mellitus ([place]) 06/26/2015  . Diabetes mellitus with chronic kidney disease ([place]) 05/30/2015  . Vitamin D deficiency 09/20/2013  . THROAT CANCER 09/20/2013  . Anxiety   . TMJ click 0000000  . Urinary retention with incomplete bladder emptying 10/31/2011  . Abnormal EKG 03/24/2011  . Rheumatoid arthritis(714.0) 04/08/2010  . Hypothyroidism 01/26/2010  . SLEEP APNEA 01/26/2010  . HEADACHE, CHRONIC 01/26/2010    Past Surgical History:  Procedure Laterality Date  . KNEE ARTHROSCOPY     right  . 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 Emphysema  Sister   . Prostate cancer Maternal Grandfather   . Clotting disorder Maternal Grandfather   . Cancer Maternal Grandmother        brain  . Depression Maternal Uncle   . Atopy Neg Hx     Social History   Tobacco Use  . Smoking status: Former Smoker    Packs/day: 3.00    Years: 30.00    Pack years: 90.00    Quit date: 07/20/2003    Years since quitting: 17.0  . Smokeless tobacco: Never Used  Vaping Use  . Vaping Use: Never used  Substance Use Topics  . Alcohol use: Not Currently    Comment: per week  . Drug use: No    Home Medications Prior to Admission medications   Medication Sig Start Date End Date Taking? Authorizing Provider  Aclidinium Bromide (TUDORZA PRESSAIR) 400 MCG/ACT AEPB Inhale 1 Inhaler into the lungs 2 (two) times daily. 08/02/19   Dettinger, Fransisca Kaufmann, MD  albuterol (VENTOLIN HFA) 108 (90 Base) MCG/ACT inhaler Inhale 2 puffs into the lungs every 6 (six) hours as needed for wheezing or shortness of breath. 08/02/19   Dettinger, Fransisca Kaufmann, MD  ARIPiprazole (ABILIFY) 2 MG tablet Take 1 tablet (2 mg total) by mouth daily. 07/01/20   [name], MD  budesonide-formoterol (SYMBICORT) 160-4.5 MCG/ACT inhaler Inhale 2 puffs into the lungs 2 (two) times daily.    [provider]  buPROPion (WELLBUTRIN XL) 150 MG 24 hr tablet Take  1 tablet (150 mg total) by mouth daily. 07/01/20   [name], MD  Cholecalciferol (VITAMIN D3) 5000 units CAPS Take 5,000 Units by mouth.    [provider]  doxepin (SINEQUAN) 10 MG capsule Take 1 capsule (10 mg total) by mouth at bedtime as needed (insomnia). 07/25/20   [name], MD  fenofibrate 160 MG tablet TAKE 1 TABLET BY MOUTH EVERY DAY 02/28/20   Dettinger, Fransisca Kaufmann, MD  levothyroxine (SYNTHROID) 200 MCG tablet TAKE 1 TABLET (200 MCG TOTAL) BY MOUTH DAILY BEFORE BREAKFAST. 03/03/20   Dettinger, Fransisca Kaufmann, MD  lisinopril (ZESTRIL) 20 MG tablet Take 1 tablet (20 mg total) by mouth daily. 08/01/19   Dettinger, Fransisca Kaufmann, MD   morphine (MS CONTIN) 30 MG 12 hr tablet Take 30 mg by mouth every 8 (eight) hours. 02/06/19   [provider]  oxyCODONE-acetaminophen (PERCOCET) 7.5-325 MG tablet TAKE 1 TABLET BY MOUTH EVERY FOUR HOURS AS NEEDED FOR PAIN 02/06/19   [provider]  pantoprazole (PROTONIX) 20 MG tablet TAKE 1 TABLET BY MOUTH EVERY DAY 09/03/19   Dettinger, Fransisca Kaufmann, MD  rosuvastatin (CRESTOR) 10 MG tablet TAKE 1 TABLET BY MOUTH EVERY DAY 08/02/19   Dettinger, Fransisca Kaufmann, MD  sertraline (ZOLOFT) 100 MG tablet Take 1 tablet (100 mg total) by mouth daily. 07/01/20   [name], MD  triamcinolone cream (KENALOG) 0.1 % Apply 1 application topically 2 (two) times daily. 10/24/18   Dettinger, Fransisca Kaufmann, MD    Allergies    Patient has no known allergies.  Review of Systems   Review of Systems  Constitutional: Positive for activity change, appetite change and fatigue.  HENT: Negative.   Respiratory: Negative.   Cardiovascular: Positive for chest pain. Negative for palpitations and leg swelling.  Gastrointestinal: Positive for abdominal pain, nausea and vomiting. Negative for abdominal distention, anal bleeding, blood in stool, constipation, diarrhea and rectal pain.  Genitourinary: Positive for flank pain. Negative for decreased urine volume, difficulty urinating, dysuria, frequency, penile pain, penile swelling, testicular pain and urgency.  Skin: Negative.   Neurological: Negative.   All other systems reviewed and are negative.   Physical Exam Updated Vital Signs BP (!) 196/111   Pulse 89   Temp 98.7 F (37.1 C) (Oral)   Resp (!) 21   SpO2 97%   Physical Exam Vitals and nursing note reviewed.  Constitutional:      General: He is in acute distress.     Appearance: He is well-developed and well-nourished. He is not ill-appearing, toxic-appearing or diaphoretic.     Comments: Appear uncomfortable.  HENT:     Head: Normocephalic and atraumatic.     Mouth/Throat:     Mouth: Mucous  membranes are moist.  Eyes:     Pupils: Pupils are equal, round, and reactive to light.  Neck:     Comments: Full ROM, Stoma to mid neck. Cardiovascular:     Rate and Rhythm: Normal rate and regular rhythm.     Pulses: Normal pulses.          Radial pulses are 2+ on the right side and 2+ on the left side.       Dorsalis pedis pulses are 2+ on the right side and 2+ on the left side.     Heart sounds: Normal heart sounds.  Pulmonary:     Effort: Pulmonary effort is normal. No respiratory distress.     Breath sounds: Normal breath sounds and air entry.     Comments:  Speaks in full sentences.  Chest:     Comments: Equal rise and fall to chest wall Abdominal:     General: Bowel sounds are normal. There is no distension.     Palpations: Abdomen is soft.     Tenderness: There is abdominal tenderness in the epigastric area, periumbilical area, suprapubic area, left upper quadrant and left lower quadrant. There is guarding. There is no right CVA tenderness or left CVA tenderness.     Hernia: No hernia is present.    Musculoskeletal:        General: Normal range of motion.     Cervical back: Full passive range of motion without pain, normal range of motion and neck supple.       Back:     Comments: No midline tenderness  Skin:    General: Skin is warm and dry.     Capillary Refill: Capillary refill takes less than 2 seconds.     Comments: No edema, erythema, warmth, rashes or lesions  Neurological:     Mental Status: He is alert.     Comments: Ambulatory without difficulty  Psychiatric:        Mood and Affect: Mood and affect normal.     ED Results / Procedures / Treatments   Labs (all labs ordered are listed, but only abnormal results are displayed) Labs Reviewed  CBC WITH DIFFERENTIAL/PLATELET - Abnormal; Notable for the following components:      Result Value   Abs Immature Granulocytes 0.08 (*)    All other components within normal limits  COMPREHENSIVE METABOLIC PANEL -  Abnormal; Notable for the following components:   Sodium 134 (*)    Glucose, Bld 105 (*)    BUN 21 (*)    ALT 48 (*)    Alkaline Phosphatase 28 (*)    All other components within normal limits  URINALYSIS, ROUTINE W REFLEX MICROSCOPIC - Abnormal; Notable for the following components:   Color, Urine STRAW (*)    All other components within normal limits  SARS CORONAVIRUS 2 BY RT PCR (HOSPITAL ORDER, [facility])  CULTURE, BLOOD (ROUTINE X 2)  CULTURE, BLOOD (ROUTINE X 2)  URINE CULTURE  LIPASE, BLOOD  LACTIC ACID, PLASMA  LACTIC ACID, PLASMA  TROPONIN I (HIGH SENSITIVITY)  TROPONIN I (HIGH SENSITIVITY)    EKG EKG Interpretation  Date/Time:  Thursday August 07 2020 16:10:19 EST Ventricular Rate:  81 PR Interval:    QRS Duration: 95 QT Interval:  356 QTC Calculation: 414 R Axis:   -37 Text Interpretation: Sinus rhythm Probable left atrial enlargement Left axis deviation RSR' in V1 or V2, right VCD or RVH Borderline T abnormalities, anterior leads Confirmed by Fredia Sorrow [phone]) on 08/07/2020 4:25:28 PM   Radiology DG Abdomen Acute W/Chest  Result Date: 08/07/2020 CLINICAL DATA:  Right lower quadrant pain. EXAM: DG ABDOMEN ACUTE WITH 1 VIEW CHEST COMPARISON:  October 31, 2011 FINDINGS: There is no evidence of dilated bowel loops or free intraperitoneal air. No radiopaque calculi or other significant radiographic abnormality is seen. Heart size and mediastinal contours are within normal limits. Both lungs are clear. IMPRESSION: Negative abdominal radiographs.  No acute cardiopulmonary disease. Electronically Signed   By: Virgina Norfolk M.D.   On: 08/07/2020 17:45    CTA chest, abd, pelvis 08/07/20 1818 IMPRESSION: 1. No aortic aneurysm or dissection. 2. No central or segmental pulmonary embolus. 3. No acute intrathoracic, intra-abdominal, intrapelvic abnormality. 4. Couple scattered pulmonary micronodules as well as  a 7 mm right middle lobe  nodule. Non-contrast chest CT at 3-6 months is recommended. If the nodules are stable at time of repeat CT, then future CT at 18-24 months (from today's scan) is considered optional for low-risk patients, but is recommended for high-risk patients. This recommendation follows the consensus statement: Guidelines for Management of Incidental Pulmonary Nodules Detected on CT Images: From the Fleischner Society 2017; Radiology 2017; 284:228-243. 5. Aortic Atherosclerosis (ICD10-I70.0) and Emphysema (ICD10-J43.9). 6. Couple of scattered transverse colon diverticula with no acute diverticulitis.   Electronically Signed By: Iven Finn M.D. On: 08/07/2020 18:18   Procedures Procedures (including critical care time)  Medications Ordered in ED Medications  sodium chloride 0.9 % bolus 1,000 mL (0 mLs Intravenous Stopped 08/07/20 1847)  ondansetron (ZOFRAN) injection 4 mg (4 mg Intravenous Given 08/07/20 1638)  morphine 4 MG/ML injection 4 mg (4 mg Intravenous Given 08/07/20 1639)  HYDROmorphone (DILAUDID) injection 1 mg (1 mg Intravenous Given 08/07/20 1754)  iohexol (OMNIPAQUE) 350 MG/ML injection 100 mL (100 mLs Intravenous Contrast Given 08/07/20 1746)  ketorolac (TORADOL) 30 MG/ML injection 30 mg (30 mg Intravenous Given 08/07/20 2017)  alum & mag hydroxide-simeth (MAALOX/MYLANTA) 200-200-20 MG/5ML suspension 30 mL (30 mLs Oral Given 08/07/20 2017)    And  lidocaine (XYLOCAINE) 2 % viscous mouth solution 15 mL (15 mLs Oral Given 08/07/20 2017)  HYDROmorphone (DILAUDID) injection 1 mg (1 mg Intravenous Given 08/07/20 2025)   ED Course  I have reviewed the triage vital signs and the nursing notes.  Pertinent labs & imaging results that were available during my care of the patient were reviewed by me and considered in my medical decision making (see chart for details).  60 year old presents for flank pain. Afebrile, non septic appear. Appear in moderate pain. No urinary complaints. No hx of  AAA, dissection, NV intact. Pain x 1 week, worsening. Diffuse tenderness with guarding to left flank and abdomen. Heart and lungs clear. Plan on labs, imaging and reassess.  Labs and imaging personally reviewed and interpreted:  CBC without leukocytosis, hemoglobin stable Metabolic panel with mild hyponatremia at 134, glucose 105, ALT 48.  No additional electrolyte, renal or liver abnormality Trop 7 Lactic acid 1.1 Lipase 26 UA negative for infection  Patient still with continued significant pain.  Holding left side on exam.  Will place orders for additional pain medication  Patient reassessed.  Discussed labs and imaging.  States he is still in pain.  Does have history of chronic pain however this feels different.  He has no skin changes to suggest zoster.  Is still continued to be diffusely tender over his left upper quadrant, left flank.  Reassuring urinalysis.  I low suspicion for kidney stone.  CTA chest, abdomen pelvis did not show any significant abnormality.  Symptoms do not seem consistent with ACS, PE.  Unclear etiology of pain. Will attempt additional dose of pain medication and PO challenge.  Care transferred to [place], [place] who will follow up on remaining labs and determine disposition.    MDM Rules/Calculators/A&P                           Final Clinical Impression(s) / ED Diagnoses Final diagnoses:  Left upper quadrant abdominal pain    Rx / DC Orders ED Discharge Orders    None       Zelma Snead A, PA-C 08/07/20 2044    Fredia Sorrow, MD 08/07/20 2153

## 2020-08-09 LAB — URINE CULTURE: Culture: <10000 — AB
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
## 2020-08-12 ENCOUNTER — Encounter: Payer: Self-pay | Admitting: Psychiatry

## 2020-08-12 ENCOUNTER — Telehealth (INDEPENDENT_AMBULATORY_CARE_PROVIDER_SITE_OTHER): Payer: Medicare Other | Admitting: Psychiatry

## 2020-08-12 ENCOUNTER — Other Ambulatory Visit: Payer: Self-pay

## 2020-08-12 ENCOUNTER — Other Ambulatory Visit: Payer: Self-pay | Admitting: Psychiatry

## 2020-08-12 DIAGNOSIS — F330 Major depressive disorder, recurrent, mild: Secondary | ICD-10-CM

## 2020-08-12 LAB — CULTURE, BLOOD (ROUTINE X 2)
Culture: NO GROWTH
Culture: NO GROWTH
Special Requests: ADEQUATE
Special Requests: ADEQUATE

## 2020-08-12 MED ORDER — SERTRALINE HCL 100 MG PO TABS
150.0000 mg | ORAL_TABLET | Freq: Every day | ORAL | 0 refills | Status: DC
Start: 2020-09-07 — End: 2020-11-11

## 2020-08-12 MED ORDER — SERTRALINE HCL 100 MG PO TABS: 100.0000 mg | ORAL_TABLET | Freq: Every day | ORAL | 0 refills | Status: DC

## 2020-08-12 MED ORDER — ARIPIPRAZOLE 2 MG PO TABS: 2.0000 mg | ORAL_TABLET | Freq: Every day | ORAL | 0 refills | Status: DC

## 2020-08-12 MED ORDER — BUPROPION HCL ER (XL) 150 MG PO TB24: 150.0000 mg | ORAL_TABLET | Freq: Every day | ORAL | 0 refills | Status: DC

## 2020-08-13 ENCOUNTER — Other Ambulatory Visit: Payer: Self-pay | Admitting: Family Medicine

## 2020-08-13 DIAGNOSIS — E1169 Type 2 diabetes mellitus with other specified complication: Secondary | ICD-10-CM

## 2020-08-13 DIAGNOSIS — E785 Hyperlipidemia, unspecified: Secondary | ICD-10-CM

## 2020-08-13 DIAGNOSIS — K219 Gastro-esophageal reflux disease without esophagitis: Secondary | ICD-10-CM

## 2020-08-15 ENCOUNTER — Encounter: Payer: Self-pay | Admitting: Family Medicine

## 2020-08-15 ENCOUNTER — Other Ambulatory Visit: Payer: Self-pay

## 2020-08-15 ENCOUNTER — Ambulatory Visit (INDEPENDENT_AMBULATORY_CARE_PROVIDER_SITE_OTHER): Payer: Medicare Other | Admitting: Family Medicine

## 2020-08-15 VITALS — BP 144/88 | HR 105 | Ht 74.0 in | Wt 239.0 lb

## 2020-08-15 DIAGNOSIS — T148XXA Other injury of unspecified body region, initial encounter: Secondary | ICD-10-CM

## 2020-08-15 DIAGNOSIS — E785 Hyperlipidemia, unspecified: Secondary | ICD-10-CM | POA: Diagnosis not present

## 2020-08-15 DIAGNOSIS — E1169 Type 2 diabetes mellitus with other specified complication: Secondary | ICD-10-CM

## 2020-08-15 DIAGNOSIS — R109 Unspecified abdominal pain: Secondary | ICD-10-CM

## 2020-08-15 DIAGNOSIS — Z23 Encounter for immunization: Secondary | ICD-10-CM

## 2020-08-15 MED ORDER — TAMSULOSIN HCL 0.4 MG PO CAPS: 0.4000 mg | ORAL_CAPSULE | Freq: Every day | ORAL | 0 refills | Status: DC

## 2020-08-15 MED ORDER — LEVOTHYROXINE SODIUM 200 MCG PO TABS
200.0000 ug | ORAL_TABLET | Freq: Every day | ORAL | 1 refills | Status: DC
Start: 2020-08-15 — End: 2021-02-09

## 2020-08-15 MED ORDER — RIZATRIPTAN BENZOATE 5 MG PO TABS: 5.0000 mg | ORAL_TABLET | ORAL | 2 refills | Status: DC | PRN

## 2020-08-15 MED ORDER — PREDNISONE 20 MG PO TABS: ORAL_TABLET | ORAL | 0 refills | Status: DC

## 2020-08-15 MED ORDER — FENOFIBRATE 160 MG PO TABS: 160.0000 mg | ORAL_TABLET | Freq: Every day | ORAL | 1 refills | Status: DC

## 2020-08-15 NOTE — Progress Notes (Signed)
BP (!) 144/88   Pulse (!) 105   Ht 6\' 2"  (1.88 m)   Wt 239 lb (108.4 kg)   SpO2 95%   BMI 30.69 kg/m    Subjective:   Patient ID: Joseph [name], male    DOB: 06/17/1961, 60 y.o.   MRN: [id number]  HPI: Joseph [name] is a 60 y.o. male presenting on 08/15/2020 for Abdominal Pain and Back Pain   HPI Pt presenting today with left sided flank and back pain. He states this has been going on for several weeks and the pain has stayed somewhat constant; it radiates from his lower thoracic spine level to around the side to the LLQ. He was seen at the hospital last Thursday for this and they did an abdominal X-ray, CT with and w/o contrast, UA, CBC, and urine culture. He states they did not find any kidney stones or infection, however, they did discover an incidental lung and adrenal nodule. He say the pain is unbearable and Percocet does not help it. He denies, fevers, chills, weight loss, chest pain, difficulty or painful breathing, hemoptysis, hematemesis, N/V/D, constipation, bloody or dark stools, dysuria, blood in his urine, or rash. He states that he is having slightly more diifculty starting his stream than usual but has had bladder issues in the past. Pt appears in significant discomfort.  Relevant past medical, surgical, family and social history reviewed and updated as indicated. Interim medical history since our last visit reviewed. Allergies and medications reviewed and updated.  Review of Systems  Constitutional: Negative for chills, diaphoresis, fatigue, fever and unexpected weight change.  HENT: Negative.   Respiratory: Negative.   Cardiovascular: Negative.   Gastrointestinal: Positive for abdominal pain (more on his side). Negative for abdominal distention, anal bleeding, blood in stool, constipation, diarrhea, nausea and vomiting.  Endocrine: Negative.   Genitourinary: Positive for flank pain. Negative for decreased urine volume, difficulty urinating, dysuria, frequency,  hematuria, scrotal swelling and urgency.  Musculoskeletal: Positive for back pain.  Skin: Negative for rash.  Neurological: Negative.   Psychiatric/Behavioral: Negative.     Per HPI unless specifically indicated above   Allergies as of 08/15/2020   No Known Allergies     Medication List       Accurate as of August 15, 2020  9:53 AM. If you have any questions, ask your nurse or doctor.        albuterol 108 (90 Base) MCG/ACT inhaler Commonly known as: VENTOLIN HFA Inhale 2 puffs into the lungs every 6 (six) hours as needed for wheezing or shortness of breath.   ARIPiprazole 2 MG tablet Commonly known as: ABILIFY Take 1 tablet (2 mg total) by mouth daily. Start taking on: September 01, 2020   budesonide-formoterol 160-4.5 MCG/ACT inhaler Commonly known as: SYMBICORT Inhale 2 puffs into the lungs 2 (two) times daily.   buPROPion 150 MG 24 hr tablet Commonly known as: WELLBUTRIN XL Take 1 tablet (150 mg total) by mouth daily. Start taking on: September 27, 2020   doxepin 10 MG capsule Commonly known as: SINEQUAN Take 1 capsule (10 mg total) by mouth at bedtime as needed (insomnia).   fenofibrate 160 MG tablet Take 1 tablet (160 mg total) by mouth daily.   levothyroxine 200 MCG tablet Commonly known as: SYNTHROID Take 1 tablet (200 mcg total) by mouth daily before breakfast.   lisinopril 20 MG tablet Commonly known as: ZESTRIL Take 1 tablet (20 mg total) by mouth daily.   morphine 30 MG 12  hr tablet Commonly known as: MS CONTIN Take 30 mg by mouth every 8 (eight) hours.   oxyCODONE-acetaminophen 7.5-325 MG tablet Commonly known as: PERCOCET TAKE 1 TABLET BY MOUTH EVERY FOUR HOURS AS NEEDED FOR PAIN   pantoprazole 20 MG tablet Commonly known as: PROTONIX TAKE 1 TABLET BY MOUTH EVERY DAY   predniSONE 20 MG tablet Commonly known as: DELTASONE Take 3 tabs daily for 1 week, then 2 tabs daily for week 2, then 1 tab daily for week 3. Started by: Worthy Rancher,  MD   rizatriptan 5 MG tablet Commonly known as: Maxalt Take 1 tablet (5 mg total) by mouth as needed for migraine. May repeat in 2 hours if needed Started by: Fransisca Kaufmann Aijalon Demuro, MD   rosuvastatin 10 MG tablet Commonly known as: CRESTOR TAKE 1 TABLET BY MOUTH EVERY DAY   sertraline 100 MG tablet Commonly known as: ZOLOFT Take 1.5 tablets (150 mg total) by mouth daily. Start taking on: September 07, 2020   tamsulosin 0.4 MG Caps capsule Commonly known as: FLOMAX Take 1 capsule (0.4 mg total) by mouth daily. Started by: Fransisca Kaufmann Sueann Brownley, MD   triamcinolone 0.1 % Commonly known as: KENALOG Apply 1 application topically 2 (two) times daily.   Tudorza Pressair 400 MCG/ACT Aepb Generic drug: Aclidinium Bromide Inhale 1 Inhaler into the lungs 2 (two) times daily.   Vitamin D3 125 MCG (5000 UT) Caps Take 5,000 Units by mouth.        Objective:   BP (!) 144/88   Pulse (!) 105   Ht 6\' 2"  (1.88 m)   Wt 239 lb (108.4 kg)   SpO2 95%   BMI 30.69 kg/m   Wt Readings from Last 3 Encounters:  08/15/20 239 lb (108.4 kg)  08/01/19 250 lb 3.2 oz (113.5 kg)  01/29/19 242 lb 3.2 oz (109.9 kg)    Physical Exam Constitutional:      Appearance: He is not toxic-appearing or diaphoretic.  Cardiovascular:     Rate and Rhythm: Normal rate and regular rhythm.     Heart sounds: Normal heart sounds.  Pulmonary:     Effort: Pulmonary effort is normal.     Breath sounds: Normal breath sounds.  Abdominal:     General: A surgical scar is present. There is no distension. There are no signs of injury.     Palpations: Abdomen is soft. There is no shifting dullness, mass or pulsatile mass.     Tenderness: There is abdominal tenderness (Left side of back radiating towards left flank). There is left CVA tenderness. There is no right CVA tenderness or guarding.     Hernia: No hernia is present.  Skin:    General: Skin is warm and dry.  Neurological:     General: No focal deficit present.      Mental Status: He is alert.  Psychiatric:        Mood and Affect: Mood normal.        Behavior: Behavior normal.       Assessment & Plan:   Problem List Items Addressed This Visit      Endocrine   Hyperlipidemia associated with type 2 diabetes mellitus ([place])   Relevant Medications   fenofibrate 160 MG tablet    Other Visit Diagnoses    Left flank pain    -  Primary   Nerve injury       Relevant Medications   predniSONE (DELTASONE) 20 MG tablet   Need for immunization against  influenza       Relevant Orders   Flu Vaccine QUAD 36+ mos IM (Completed)       Follow up plan: Return if symptoms worsen or fail to improve, for 1 to 2-week recheck.  Pt symptoms suggest possible renal stone or nerve pain from impingement. Though the hospital did not report any kidney stone findings on imaging, we will treat with Flomax in case there were stones they did not see. [name] pain is not over the stomach and imaging and labs from the hospital ruled out infectious causes or pancreatitis. He has no signs or symptoms of gastric ulcers or a UTI. UA and urine culture from the hospital were normal. We will also treat with prednisone in case he has nerve inflammation that is causing his pain.   Counseling provided for all of the vaccine components No orders of the defined types were placed in this encounter.  Silverio Decamp 08/15/2020  I was personally present for all components of the history, physical exam and/or medical decision making.  I agree with the documentation performed by the PA student and agree with assessment and plan above.  PA student was [employer]  Caryl Pina, MD [facility] 08/15/2020, 9:53 AM

## 2020-08-22 ENCOUNTER — Other Ambulatory Visit: Payer: Self-pay | Admitting: Family Medicine

## 2020-08-22 DIAGNOSIS — R109 Unspecified abdominal pain: Secondary | ICD-10-CM

## 2020-08-28 DIAGNOSIS — M47817 Spondylosis without myelopathy or radiculopathy, lumbosacral region: Secondary | ICD-10-CM | POA: Diagnosis not present

## 2020-08-28 DIAGNOSIS — K5900 Constipation, unspecified: Secondary | ICD-10-CM | POA: Diagnosis not present

## 2020-08-28 DIAGNOSIS — E1142 Type 2 diabetes mellitus with diabetic polyneuropathy: Secondary | ICD-10-CM | POA: Diagnosis not present

## 2020-08-28 DIAGNOSIS — G894 Chronic pain syndrome: Secondary | ICD-10-CM | POA: Diagnosis not present

## 2020-08-29 ENCOUNTER — Encounter: Payer: Self-pay | Admitting: Family Medicine

## 2020-08-29 ENCOUNTER — Ambulatory Visit (INDEPENDENT_AMBULATORY_CARE_PROVIDER_SITE_OTHER): Payer: Medicare Other | Admitting: Family Medicine

## 2020-08-29 ENCOUNTER — Other Ambulatory Visit: Payer: Self-pay | Admitting: Family Medicine

## 2020-08-29 ENCOUNTER — Other Ambulatory Visit: Payer: Self-pay

## 2020-08-29 VITALS — BP 131/86 | HR 110 | Ht 74.0 in | Wt 236.0 lb

## 2020-08-29 DIAGNOSIS — R109 Unspecified abdominal pain: Secondary | ICD-10-CM | POA: Diagnosis not present

## 2020-08-29 DIAGNOSIS — T148XXA Other injury of unspecified body region, initial encounter: Secondary | ICD-10-CM

## 2020-08-29 DIAGNOSIS — M546 Pain in thoracic spine: Secondary | ICD-10-CM | POA: Diagnosis not present

## 2020-08-29 MED ORDER — ACCU-CHEK AVIVA PLUS VI STRP: ORAL_STRIP | 12 refills | Status: DC

## 2020-08-29 MED ORDER — PREDNISONE 20 MG PO TABS
ORAL_TABLET | ORAL | 0 refills | Status: DC
Start: 2020-08-29 — End: 2021-03-02

## 2020-08-29 NOTE — Progress Notes (Signed)
BP 131/86   Pulse (!) 110   Ht 6\' 2"  (1.88 m)   Wt 236 lb (107 kg)   SpO2 94%   BMI 30.30 kg/m    Subjective:   Patient ID: Joseph [name], male    DOB: May 24, 1961, 60 y.o.   MRN: [id number]  HPI: Joseph [name] is a 60 y.o. male presenting on 08/29/2020 for Flank Pain (Left sided. Some better since last visit/)   HPI Patient comes in today with left flank pain and concern for possible nerve injury to the left flank.  He is already gone to see Dr. Cleotis Lema who is his person who does injections for nerves.  She said she does seem like it could be a possibility that it could be nerve impingement from his spine.  Especially since it did improve with the steroids.  He says his blood sugars are up and and needs Accu-Chek Aviva strips.  We will order an MRI because she said that would be the next step to do that he can follow-up with her.  He denies any urinary or abdominal pain any further.  The pain is mostly midthoracic on the left side but does not come around to his abdomen any further.  Relevant past medical, surgical, family and social history reviewed and updated as indicated. Interim medical history since our last visit reviewed. Allergies and medications reviewed and updated.  Review of Systems  Constitutional: Negative for chills and fever.  Respiratory: Negative for shortness of breath and wheezing.   Cardiovascular: Negative for chest pain and leg swelling.  Gastrointestinal: Negative for abdominal pain.  Genitourinary: Positive for flank pain.  Musculoskeletal: Positive for arthralgias and back pain. Negative for gait problem.  Skin: Negative for rash.  All other systems reviewed and are negative.   Per HPI unless specifically indicated above   Allergies as of 08/29/2020   No Known Allergies     Medication List       Accurate as of August 29, 2020  3:48 PM. If you have any questions, ask your nurse or doctor.        albuterol 108 (90 Base) MCG/ACT  inhaler Commonly known as: VENTOLIN HFA Inhale 2 puffs into the lungs every 6 (six) hours as needed for wheezing or shortness of breath.   ARIPiprazole 2 MG tablet Commonly known as: ABILIFY Take 1 tablet (2 mg total) by mouth daily. Start taking on: September 01, 2020   budesonide-formoterol 160-4.5 MCG/ACT inhaler Commonly known as: SYMBICORT Inhale 2 puffs into the lungs 2 (two) times daily.   buPROPion 150 MG 24 hr tablet Commonly known as: WELLBUTRIN XL Take 1 tablet (150 mg total) by mouth daily. Start taking on: September 27, 2020   doxepin 10 MG capsule Commonly known as: SINEQUAN Take 1 capsule (10 mg total) by mouth at bedtime as needed (insomnia).   fenofibrate 160 MG tablet Take 1 tablet (160 mg total) by mouth daily.   levothyroxine 200 MCG tablet Commonly known as: SYNTHROID Take 1 tablet (200 mcg total) by mouth daily before breakfast.   lisinopril 20 MG tablet Commonly known as: ZESTRIL Take 1 tablet (20 mg total) by mouth daily.   morphine 30 MG 12 hr tablet Commonly known as: MS CONTIN Take 30 mg by mouth every 8 (eight) hours.   oxyCODONE-acetaminophen 7.5-325 MG tablet Commonly known as: PERCOCET TAKE 1 TABLET BY MOUTH EVERY FOUR HOURS AS NEEDED FOR PAIN   pantoprazole 20 MG tablet Commonly known as: PROTONIX  TAKE 1 TABLET BY MOUTH EVERY DAY   predniSONE 20 MG tablet Commonly known as: DELTASONE Take 3 tabs daily for 1 week, then 2 tabs daily for week 2, then 1 tab daily for week 3.   rizatriptan 5 MG tablet Commonly known as: Maxalt Take 1 tablet (5 mg total) by mouth as needed for migraine. May repeat in 2 hours if needed   rosuvastatin 10 MG tablet Commonly known as: CRESTOR TAKE 1 TABLET BY MOUTH EVERY DAY   sertraline 100 MG tablet Commonly known as: ZOLOFT Take 1.5 tablets (150 mg total) by mouth daily. Start taking on: September 07, 2020   tamsulosin 0.4 MG Caps capsule Commonly known as: FLOMAX TAKE 1 CAPSULE BY MOUTH EVERY DAY    triamcinolone 0.1 % Commonly known as: KENALOG Apply 1 application topically 2 (two) times daily.   Tudorza Pressair 400 MCG/ACT Aepb Generic drug: Aclidinium Bromide Inhale 1 Inhaler into the lungs 2 (two) times daily.   Vitamin D3 125 MCG (5000 UT) Caps Take 5,000 Units by mouth.        Objective:   BP 131/86   Pulse (!) 110   Ht 6\' 2"  (1.88 m)   Wt 236 lb (107 kg)   SpO2 94%   BMI 30.30 kg/m   Wt Readings from Last 3 Encounters:  08/29/20 236 lb (107 kg)  08/15/20 239 lb (108.4 kg)  08/01/19 250 lb 3.2 oz (113.5 kg)    Physical Exam Vitals and nursing note reviewed.  Constitutional:      General: He is not in acute distress.    Appearance: He is well-developed and well-nourished. He is not diaphoretic.  Eyes:     General: No scleral icterus.    Extraocular Movements: EOM normal.     Conjunctiva/sclera: Conjunctivae normal.  Neck:     Thyroid: No thyromegaly.  Cardiovascular:     Pulses: Intact distal pulses.  Abdominal:     General: Abdomen is flat. Bowel sounds are normal. There is no distension.     Tenderness: There is no abdominal tenderness. There is no right CVA tenderness, left CVA tenderness or guarding.  Musculoskeletal:        General: Tenderness present. No edema. Normal range of motion.     Thoracic back: Tenderness present.       Back:  Skin:    General: Skin is warm and dry.     Findings: No rash.  Neurological:     Mental Status: He is alert and oriented to person, place, and time.     Coordination: Coordination normal.  Psychiatric:        Mood and Affect: Mood and affect normal.        Behavior: Behavior normal.       Assessment & Plan:   Problem List Items Addressed This Visit   None   Visit Diagnoses    Left flank pain    -  Primary   Relevant Medications   predniSONE (DELTASONE) 20 MG tablet   Other Relevant Orders   MR Thoracic Spine Wo Contrast   Nerve injury       Relevant Medications   predniSONE (DELTASONE) 20  MG tablet   Other Relevant Orders   MR Thoracic Spine Wo Contrast   Acute midline thoracic back pain       Relevant Medications   predniSONE (DELTASONE) 20 MG tablet   Other Relevant Orders   MR Thoracic Spine Wo Contrast      Persistent  back pain despite seeing his back doctor and doing a steroid course and stretches and exercises at home.  Concern for nerve impingement based on the shooting pain that comes around to his abdomen that is now improved with prednisone Follow up plan: Return if symptoms worsen or fail to improve.  Counseling provided for all of the vaccine components No orders of the defined types were placed in this encounter.   Caryl Pina, MD [facility] 08/29/2020, 3:48 PM

## 2020-09-01 ENCOUNTER — Telehealth: Payer: Self-pay | Admitting: Family Medicine

## 2020-09-02 ENCOUNTER — Other Ambulatory Visit: Payer: Self-pay | Admitting: Family Medicine

## 2020-09-02 DIAGNOSIS — K219 Gastro-esophageal reflux disease without esophagitis: Secondary | ICD-10-CM

## 2020-09-04 ENCOUNTER — Telehealth: Payer: Self-pay

## 2020-09-04 NOTE — Telephone Encounter (Signed)
  Prescription Request  09/04/2020  What is the name of the medication or equipment? PREDNISONE AND PT SAID THE TEST STRIPS THAT WERE CALLED INTO PHARMACY HAD MISSING INFO.  INSURANCE WILL NOT PAY UNTIL THIS IS FILLED OUT RIGHT  Have you contacted your pharmacy to request a refill? (if applicable) YES  Which pharmacy would you like this sent to? [facility]   Patient notified that their request is being sent to the clinical staff for review and that they should receive a response within 2 business days.

## 2020-09-05 ENCOUNTER — Other Ambulatory Visit: Payer: Self-pay | Admitting: Family Medicine

## 2020-09-05 DIAGNOSIS — R109 Unspecified abdominal pain: Secondary | ICD-10-CM

## 2020-09-05 NOTE — Telephone Encounter (Signed)
[facility] to [facility] test strips have all information Prednisone was sent in on 08/29/20 Pt will contact pharmacy again and let us know if they need anything else

## 2020-09-11 ENCOUNTER — Ambulatory Visit (HOSPITAL_COMMUNITY)
Admission: RE | Admit: 2020-09-11 | Discharge: 2020-09-11 | Disposition: A | Discharge status: Home / Self Care | Payer: Medicare Other | Source: Ambulatory Visit | Attending: Family Medicine | Admitting: Family Medicine

## 2020-09-11 ENCOUNTER — Other Ambulatory Visit: Payer: Self-pay

## 2020-09-11 DIAGNOSIS — M546 Pain in thoracic spine: Secondary | ICD-10-CM | POA: Diagnosis not present

## 2020-09-11 DIAGNOSIS — T148XXA Other injury of unspecified body region, initial encounter: Secondary | ICD-10-CM | POA: Diagnosis not present

## 2020-09-11 DIAGNOSIS — R109 Unspecified abdominal pain: Secondary | ICD-10-CM

## 2020-09-18 ENCOUNTER — Other Ambulatory Visit: Payer: Self-pay | Admitting: Family Medicine

## 2020-09-18 DIAGNOSIS — R109 Unspecified abdominal pain: Secondary | ICD-10-CM

## 2020-09-19 ENCOUNTER — Other Ambulatory Visit: Payer: Self-pay | Admitting: Family Medicine

## 2020-09-19 DIAGNOSIS — M546 Pain in thoracic spine: Secondary | ICD-10-CM

## 2020-09-19 DIAGNOSIS — T148XXA Other injury of unspecified body region, initial encounter: Secondary | ICD-10-CM

## 2020-09-19 DIAGNOSIS — R109 Unspecified abdominal pain: Secondary | ICD-10-CM

## 2020-09-25 DIAGNOSIS — M47817 Spondylosis without myelopathy or radiculopathy, lumbosacral region: Secondary | ICD-10-CM | POA: Diagnosis not present

## 2020-09-25 DIAGNOSIS — K5900 Constipation, unspecified: Secondary | ICD-10-CM | POA: Diagnosis not present

## 2020-09-25 DIAGNOSIS — E1142 Type 2 diabetes mellitus with diabetic polyneuropathy: Secondary | ICD-10-CM | POA: Diagnosis not present

## 2020-09-25 DIAGNOSIS — G894 Chronic pain syndrome: Secondary | ICD-10-CM | POA: Diagnosis not present

## 2020-09-27 ENCOUNTER — Other Ambulatory Visit: Payer: Self-pay | Admitting: Family Medicine

## 2020-09-27 DIAGNOSIS — K219 Gastro-esophageal reflux disease without esophagitis: Secondary | ICD-10-CM

## 2020-09-29 NOTE — Telephone Encounter (Signed)
Dettinger. NTBS 30 days given 09/03/20

## 2020-09-30 ENCOUNTER — Telehealth: Payer: Self-pay

## 2020-09-30 NOTE — Telephone Encounter (Signed)
  Prescription Request  09/30/2020  What is the name of the medication or equipment? Wife said pt saw Dr Dettinger last week for his back and he needs more prednisone called in  Have you contacted your pharmacy to request a refill? (if applicable) no  Which [facility]   Patient notified that their request is being sent to the clinical staff for review and that they should receive a response within 2 business days.

## 2020-09-30 NOTE — Telephone Encounter (Signed)
Pt was seen & prescribed Prednisone on 08/29/20, says no one knows what is wrong with his back when I mentioned we do not do refills on this medication and he would need to schedule a visit. He would like this reviewed by Dr. Warrick Parisian. Please advise

## 2020-10-01 NOTE — Telephone Encounter (Signed)
Patient aware and verbalizes understanding. 

## 2020-10-01 NOTE — Telephone Encounter (Signed)
We usually only do a short course of prednisone because of the continued for too long it causes other problems and side effects.  Call back to back specialist again

## 2020-10-07 ENCOUNTER — Other Ambulatory Visit: Payer: Self-pay | Admitting: Family Medicine

## 2020-10-23 DIAGNOSIS — K5900 Constipation, unspecified: Secondary | ICD-10-CM | POA: Diagnosis not present

## 2020-10-23 DIAGNOSIS — E1142 Type 2 diabetes mellitus with diabetic polyneuropathy: Secondary | ICD-10-CM | POA: Diagnosis not present

## 2020-10-23 DIAGNOSIS — M47817 Spondylosis without myelopathy or radiculopathy, lumbosacral region: Secondary | ICD-10-CM | POA: Diagnosis not present

## 2020-10-23 DIAGNOSIS — G894 Chronic pain syndrome: Secondary | ICD-10-CM | POA: Diagnosis not present

## 2020-11-04 ENCOUNTER — Other Ambulatory Visit: Payer: Self-pay | Admitting: Family Medicine

## 2020-11-04 DIAGNOSIS — M546 Pain in thoracic spine: Secondary | ICD-10-CM

## 2020-11-04 DIAGNOSIS — R10A2 Flank pain, left side: Secondary | ICD-10-CM

## 2020-11-04 DIAGNOSIS — L249 Irritant contact dermatitis, unspecified cause: Secondary | ICD-10-CM

## 2020-11-04 DIAGNOSIS — R109 Unspecified abdominal pain: Secondary | ICD-10-CM

## 2020-11-04 DIAGNOSIS — T148XXA Other injury of unspecified body region, initial encounter: Secondary | ICD-10-CM

## 2020-11-04 DIAGNOSIS — K219 Gastro-esophageal reflux disease without esophagitis: Secondary | ICD-10-CM
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
## 2020-11-10 ENCOUNTER — Telehealth: Payer: Self-pay

## 2020-11-10 ENCOUNTER — Other Ambulatory Visit: Payer: Self-pay | Admitting: Psychiatry

## 2020-11-10 DIAGNOSIS — F330 Major depressive disorder, recurrent, mild: Secondary | ICD-10-CM

## 2020-11-10 MED ORDER — DOXEPIN HCL 10 MG PO CAPS: 10.0000 mg | ORAL_CAPSULE | Freq: Every evening | ORAL | 1 refills | Status: DC | PRN

## 2020-11-10 NOTE — Telephone Encounter (Signed)
received fax requesting a refill on the doxepin 10mg 

## 2020-11-10 NOTE — Telephone Encounter (Signed)
Ordered

## 2020-11-11 ENCOUNTER — Encounter: Payer: Self-pay | Admitting: Psychiatry

## 2020-11-11 ENCOUNTER — Telehealth (INDEPENDENT_AMBULATORY_CARE_PROVIDER_SITE_OTHER): Payer: Medicare Other | Admitting: Psychiatry

## 2020-11-11 ENCOUNTER — Other Ambulatory Visit: Payer: Self-pay

## 2020-11-11 DIAGNOSIS — F330 Major depressive disorder, recurrent, mild: Secondary | ICD-10-CM | POA: Diagnosis not present

## 2020-11-11 MED ORDER — SERTRALINE HCL 100 MG PO TABS: 150.0000 mg | ORAL_TABLET | Freq: Every day | ORAL | 1 refills | Status: DC

## 2020-11-11 MED ORDER — ARIPIPRAZOLE 2 MG PO TABS: 2.0000 mg | ORAL_TABLET | Freq: Every day | ORAL | 0 refills | Status: DC

## 2020-11-11 MED ORDER — BUPROPION HCL ER (XL) 150 MG PO TB24: 150.0000 mg | ORAL_TABLET | Freq: Every day | ORAL | 1 refills | Status: DC

## 2020-11-11 NOTE — Patient Instructions (Signed)
1. Continue sertraline 150 mg daily  2.Continuebupropion150 mg at night 3.Continue doxepin 10 mg at nightas needed for sleep 4. Continue Abilify 2 mg at night 5.Next appointment:7/25 at 1 PM

## 2020-11-20 DIAGNOSIS — K5900 Constipation, unspecified: Secondary | ICD-10-CM | POA: Diagnosis not present

## 2020-11-20 DIAGNOSIS — E1142 Type 2 diabetes mellitus with diabetic polyneuropathy: Secondary | ICD-10-CM | POA: Diagnosis not present

## 2020-11-20 DIAGNOSIS — M47817 Spondylosis without myelopathy or radiculopathy, lumbosacral region: Secondary | ICD-10-CM | POA: Diagnosis not present

## 2020-11-20 DIAGNOSIS — G894 Chronic pain syndrome: Secondary | ICD-10-CM | POA: Diagnosis not present

## 2020-12-03 ENCOUNTER — Other Ambulatory Visit: Payer: Self-pay | Admitting: Family Medicine

## 2020-12-22 DIAGNOSIS — E1142 Type 2 diabetes mellitus with diabetic polyneuropathy: Secondary | ICD-10-CM | POA: Diagnosis not present

## 2020-12-22 DIAGNOSIS — K5900 Constipation, unspecified: Secondary | ICD-10-CM | POA: Diagnosis not present

## 2020-12-22 DIAGNOSIS — G894 Chronic pain syndrome: Secondary | ICD-10-CM | POA: Diagnosis not present

## 2020-12-22 DIAGNOSIS — M47817 Spondylosis without myelopathy or radiculopathy, lumbosacral region: Secondary | ICD-10-CM | POA: Diagnosis not present

## 2021-01-08 ENCOUNTER — Other Ambulatory Visit: Payer: Self-pay | Admitting: Family Medicine

## 2021-01-08 NOTE — Telephone Encounter (Signed)
Det[place]er. NTBS 30 days given 12/03/20

## 2021-01-20 DIAGNOSIS — G894 Chronic pain syndrome: Secondary | ICD-10-CM | POA: Diagnosis not present

## 2021-01-20 DIAGNOSIS — E1142 Type 2 diabetes mellitus with diabetic polyneuropathy: Secondary | ICD-10-CM | POA: Diagnosis not present

## 2021-01-20 DIAGNOSIS — K5900 Constipation, unspecified: Secondary | ICD-10-CM | POA: Diagnosis not present

## 2021-01-20 DIAGNOSIS — Z79891 Long term (current) use of opiate analgesic: Secondary | ICD-10-CM | POA: Diagnosis not present

## 2021-01-20 DIAGNOSIS — M47817 Spondylosis without myelopathy or radiculopathy, lumbosacral region: Secondary | ICD-10-CM | POA: Diagnosis not present

## 2021-02-01 ENCOUNTER — Other Ambulatory Visit: Payer: Self-pay | Admitting: Family Medicine

## 2021-02-01 DIAGNOSIS — K219 Gastro-esophageal reflux disease without esophagitis: Secondary | ICD-10-CM
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
## 2021-02-07 ENCOUNTER — Other Ambulatory Visit: Payer: Self-pay | Admitting: Family Medicine

## 2021-02-07 DIAGNOSIS — E1169 Type 2 diabetes mellitus with other specified complication: Secondary | ICD-10-CM

## 2021-02-07 DIAGNOSIS — E785 Hyperlipidemia, unspecified: Secondary | ICD-10-CM

## 2021-02-09 ENCOUNTER — Encounter: Payer: Self-pay | Admitting: Psychiatry

## 2021-02-09 ENCOUNTER — Telehealth (INDEPENDENT_AMBULATORY_CARE_PROVIDER_SITE_OTHER): Payer: Medicare Other | Admitting: Psychiatry

## 2021-02-09 ENCOUNTER — Other Ambulatory Visit: Payer: Self-pay

## 2021-02-09 DIAGNOSIS — F330 Major depressive disorder, recurrent, mild: Secondary | ICD-10-CM | POA: Diagnosis not present

## 2021-02-09 MED ORDER — ARIPIPRAZOLE 2 MG PO TABS: 2.0000 mg | ORAL_TABLET | Freq: Every day | ORAL | 0 refills | Status: DC

## 2021-02-09 NOTE — Patient Instructions (Signed)
1. Continue sertraline 150 mg daily  2. Continue bupropion 150 mg at night  3. Continue doxepin 10 mg at night as needed for sleep 4. Continue Abilify 2 mg at night  5. Next appointment: 11/3 at 1 PM

## 2021-02-12 ENCOUNTER — Other Ambulatory Visit: Payer: Self-pay

## 2021-02-12 ENCOUNTER — Telehealth: Payer: Self-pay | Admitting: Family Medicine

## 2021-02-12 DIAGNOSIS — K219 Gastro-esophageal reflux disease without esophagitis: Secondary | ICD-10-CM

## 2021-02-12 DIAGNOSIS — E1169 Type 2 diabetes mellitus with other specified complication: Secondary | ICD-10-CM

## 2021-02-12 DIAGNOSIS — E785 Hyperlipidemia, unspecified: Secondary | ICD-10-CM

## 2021-02-12 MED ORDER — RIZATRIPTAN BENZOATE 5 MG PO TABS: 5.0000 mg | ORAL_TABLET | ORAL | 0 refills | Status: DC | PRN

## 2021-02-12 MED ORDER — PANTOPRAZOLE SODIUM 20 MG PO TBEC: 20.0000 mg | DELAYED_RELEASE_TABLET | Freq: Every day | ORAL | 0 refills | Status: DC

## 2021-02-12 MED ORDER — FENOFIBRATE 160 MG PO TABS: 160.0000 mg | ORAL_TABLET | Freq: Every day | ORAL | 0 refills | Status: DC

## 2021-02-12 MED ORDER — LEVOTHYROXINE SODIUM 200 MCG PO TABS: 200.0000 ug | ORAL_TABLET | Freq: Every day | ORAL | 0 refills | Status: DC

## 2021-02-12 NOTE — Telephone Encounter (Signed)
  Prescription Request  02/12/2021  What is the name of the medication or equipment? fenofibrate 160 MG tablet  levothyroxine (SYNTHROID) 200 MCG tablet  pantoprazole (PROTONIX) 20 MG tablet  rizatriptan (MAXALT) 5 MG tablet Have you contacted your pharmacy to request a refill? (if applicable) YES  Which pharmacy would you like this sent to? [facility] Pt has appt with PCP on 03/19/21 will run out before then   Patient notified that their request is being sent to the clinical staff for review and that they should receive a response within 2 business days.

## 2021-02-12 NOTE — Telephone Encounter (Signed)
2 month supply of each med sent to [facility] [place]

## 2021-02-18 DIAGNOSIS — G894 Chronic pain syndrome: Secondary | ICD-10-CM | POA: Diagnosis not present

## 2021-02-18 DIAGNOSIS — M47817 Spondylosis without myelopathy or radiculopathy, lumbosacral region: Secondary | ICD-10-CM | POA: Diagnosis not present

## 2021-02-18 DIAGNOSIS — K5900 Constipation, unspecified: Secondary | ICD-10-CM | POA: Diagnosis not present

## 2021-02-18 DIAGNOSIS — E1142 Type 2 diabetes mellitus with diabetic polyneuropathy: Secondary | ICD-10-CM | POA: Diagnosis not present

## 2021-02-20 ENCOUNTER — Encounter: Payer: Self-pay | Admitting: Nurse Practitioner

## 2021-02-20 ENCOUNTER — Ambulatory Visit (INDEPENDENT_AMBULATORY_CARE_PROVIDER_SITE_OTHER): Payer: Medicare Other | Admitting: Nurse Practitioner

## 2021-02-20 DIAGNOSIS — Z5329 Procedure and treatment not carried out because of patient's decision for other reasons: Secondary | ICD-10-CM

## 2021-02-20 DIAGNOSIS — Z91199 Patient's noncompliance with other medical treatment and regimen due to unspecified reason: Secondary | ICD-10-CM

## 2021-02-20 NOTE — Progress Notes (Signed)
Patient did not answer phone call. I left a message for patient to call back

## 2021-03-02 ENCOUNTER — Telehealth: Payer: Self-pay | Admitting: Family Medicine

## 2021-03-02 ENCOUNTER — Ambulatory Visit (INDEPENDENT_AMBULATORY_CARE_PROVIDER_SITE_OTHER): Payer: Medicare Other

## 2021-03-02 VITALS — Ht 74.0 in | Wt 240.0 lb

## 2021-03-02 DIAGNOSIS — Z1211 Encounter for screening for malignant neoplasm of colon: Secondary | ICD-10-CM

## 2021-03-02 DIAGNOSIS — Z0000 Encounter for general adult medical examination without abnormal findings: Secondary | ICD-10-CM | POA: Diagnosis not present

## 2021-03-02 NOTE — Telephone Encounter (Signed)
-----   Message from Sandrea Hammond, LPN sent at [id number]  4:16 PM EDT ----- Regarding: cortisone injection Contact: [phone] His left elbow has been bothering him for months, now is worse, swollen, tender to touch - wants to know if Dr Warrick Parisian can draw fluid off if needed and give cortisone injection when he comes in September and/or can he come in sooner than 9/9 for this plus routine visit?

## 2021-03-02 NOTE — Patient Instructions (Signed)
[name] , Thank you for taking time to come for your Medicare Wellness Visit. I appreciate your ongoing commitment to your health goals. Please review the following plan we discussed and let me know if I can assist you in the future.   Screening recommendations/referrals: Colonoscopy: Ordered Cologuard test today Recommended yearly ophthalmology/optometry visit for glaucoma screening and checkup Recommended yearly dental visit for hygiene and checkup  Vaccinations: Influenza vaccine: Done 08/15/2020 - repeat in fall Pneumococcal vaccine: Done 07/27/2017 & 07/31/2018 Tdap vaccine: Done 12/17/2011 - Repeat in 10 years Shingles vaccine: Due. Shingrix discussed. Please contact your pharmacy for coverage information.     Covid-19: Declined  Advanced directives: Please bring a copy of your health care power of attorney and living will to the office to be added to your chart at your convenience.   Conditions/risks identified: Aim for 30 minutes of exercise or brisk walking each day, drink 6-8 glasses of water and eat lots of fruits and vegetables.   Next appointment: Follow up in one year for your annual wellness visit   Preventive Care 40-64 Years, Male Preventive care refers to lifestyle choices and visits with your health care provider that can promote health and wellness. What does preventive care include? A yearly physical exam. This is also called an annual well check. Dental exams once or twice a year. Routine eye exams. Ask your health care provider how often you should have your eyes checked. Personal lifestyle choices, including: Daily care of your teeth and gums. Regular physical activity. Eating a healthy diet. Avoiding tobacco and drug use. Limiting alcohol use. Practicing safe sex. Taking low-dose aspirin every day starting at age 79. What happens during an annual well check? The services and screenings done by your health care provider during your annual well check will  depend on your age, overall health, lifestyle risk factors, and family history of disease. Counseling  Your health care provider may ask you questions about your: Alcohol use. Tobacco use. Drug use. Emotional well-being. Home and relationship well-being. Sexual activity. Eating habits. Work and work Statistician. Screening  You may have the following tests or measurements: Height, weight, and BMI. Blood pressure. Lipid and cholesterol levels. These may be checked every 5 years, or more frequently if you are over 49 years old. Skin check. Lung cancer screening. You may have this screening every year starting at age 34 if you have a 30-pack-year history of smoking and currently smoke or have quit within the past 15 years. Fecal occult blood test (FOBT) of the stool. You may have this test every year starting at age 48. Flexible sigmoidoscopy or colonoscopy. You may have a sigmoidoscopy every 5 years or a colonoscopy every 10 years starting at age 29. Prostate cancer screening. Recommendations will vary depending on your family history and other risks. Hepatitis C blood test. Hepatitis B blood test. Sexually transmitted disease (STD) testing. Diabetes screening. This is done by checking your blood sugar (glucose) after you have not eaten for a while (fasting). You may have this done every 1-3 years. Discuss your test results, treatment options, and if necessary, the need for more tests with your health care provider. Vaccines  Your health care provider may recommend certain vaccines, such as: Influenza vaccine. This is recommended every year. Tetanus, diphtheria, and acellular pertussis (Tdap, Td) vaccine. You may need a Td booster every 10 years. Zoster vaccine. You may need this after age 25. Pneumococcal 13-valent conjugate (PCV13) vaccine. You may need this if you have  certain conditions and have not been vaccinated. Pneumococcal polysaccharide (PPSV23) vaccine. You may need one or  two doses if you smoke cigarettes or if you have certain conditions. Talk to your health care provider about which screenings and vaccines you need and how often you need them. This information is not intended to replace advice given to you by your health care provider. Make sure you discuss any questions you have with your health care provider. Document Released: 08/01/2015 Document Revised: 03/24/2016 Document Reviewed: 05/06/2015 Elsevier Interactive Patient Education  2017 [place] Prevention in the Home Falls can cause injuries. They can happen to people of all ages. There are many things you can do to make your home safe and to help prevent falls. What can I do on the outside of my home? Regularly fix the edges of walkways and driveways and fix any cracks. Remove anything that might make you trip as you walk through a door, such as a raised step or threshold. Trim any bushes or trees on the path to your home. Use bright outdoor lighting. Clear any walking paths of anything that might make someone trip, such as rocks or tools. Regularly check to see if handrails are loose or broken. Make sure that both sides of any steps have handrails. Any raised decks and porches should have guardrails on the edges. Have any leaves, snow, or ice cleared regularly. Use sand or salt on walking paths during winter. Clean up any spills in your garage right away. This includes oil or grease spills. What can I do in the bathroom? Use night lights. Install grab bars by the toilet and in the tub and shower. Do not use towel bars as grab bars. Use non-skid mats or decals in the tub or shower. If you need to sit down in the shower, use a plastic, non-slip stool. Keep the floor dry. Clean up any water that spills on the floor as soon as it happens. Remove soap buildup in the tub or shower regularly. Attach bath mats securely with double-sided non-slip rug tape. Do not have throw rugs and other  things on the floor that can make you trip. What can I do in the bedroom? Use night lights. Make sure that you have a light by your bed that is easy to reach. Do not use any sheets or blankets that are too big for your bed. They should not hang down onto the floor. Have a firm chair that has side arms. You can use this for support while you get dressed. Do not have throw rugs and other things on the floor that can make you trip. What can I do in the kitchen? Clean up any spills right away. Avoid walking on wet floors. Keep items that you use a lot in easy-to-reach places. If you need to reach something above you, use a strong step stool that has a grab bar. Keep electrical cords out of the way. Do not use floor polish or wax that makes floors slippery. If you must use wax, use non-skid floor wax. Do not have throw rugs and other things on the floor that can make you trip. What can I do with my stairs? Do not leave any items on the stairs. Make sure that there are handrails on both sides of the stairs and use them. Fix handrails that are broken or loose. Make sure that handrails are as long as the stairways. Check any carpeting to make sure that it is firmly attached  to the stairs. Fix any carpet that is loose or worn. Avoid having throw rugs at the top or bottom of the stairs. If you do have throw rugs, attach them to the floor with carpet tape. Make sure that you have a light switch at the top of the stairs and the bottom of the stairs. If you do not have them, ask someone to add them for you. What else can I do to help prevent falls? Wear shoes that: Do not have high heels. Have rubber bottoms. Are comfortable and fit you well. Are closed at the toe. Do not wear sandals. If you use a stepladder: Make sure that it is fully opened. Do not climb a closed stepladder. Make sure that both sides of the stepladder are locked into place. Ask someone to hold it for you, if possible. Clearly  mark and make sure that you can see: Any grab bars or handrails. First and last steps. Where the edge of each step is. Use tools that help you move around (mobility aids) if they are needed. These include: Canes. Walkers. Scooters. Crutches. Turn on the lights when you go into a dark area. Replace any light bulbs as soon as they burn out. Set up your furniture so you have a clear path. Avoid moving your furniture around. If any of your floors are uneven, fix them. If there are any pets around you, be aware of where they are. Review your medicines with your doctor. Some medicines can make you feel dizzy. This can increase your chance of falling. Ask your doctor what other things that you can do to help prevent falls. This information is not intended to replace advice given to you by your health care provider. Make sure you discuss any questions you have with your health care provider. Document Released: 05/01/2009 Document Revised: 12/11/2015 Document Reviewed: 08/09/2014 Elsevier Interactive Patient Education  2017 [employer].

## 2021-03-02 NOTE — Progress Notes (Signed)
Subjective:   Joseph [name] is a 60 y.o. male who presents for Medicare Annual/Subsequent preventive examination.  Virtual Visit via Telephone Note  I connected with  Joseph [name] on 03/02/21 at  4:15 PM EDT by telephone and verified that I am speaking with the correct person using two identifiers.  Location: Patient: Home Provider: [facility] Persons participating in the virtual visit: patient/Nurse Health Advisor   I discussed the limitations, risks, security and privacy concerns of performing an evaluation and management service by telephone and the availability of in person appointments. The patient expressed understanding and agreed to proceed.  Interactive audio and video telecommunications were attempted between this nurse and patient, however failed, due to patient having technical difficulties OR patient did not have access to video capability.  We continued and completed visit with audio only.  Some vital signs may be absent or patient reported.   Ell Tiso E Corona Popovich, LPN   Review of Systems     Cardiac Risk Factors include: advanced age (>42mn, >>22women);diabetes mellitus;dyslipidemia;hypertension;male gender;obesity (BMI >30kg/m2);sedentary lifestyle     Objective:    Today's Vitals   03/02/21 1559 03/02/21 1600  Weight: 240 lb (108.9 kg)   Height: '6\' 2"'$  (1.88 m)   PainSc:  7    Body mass index is 30.81 kg/m.  Advanced Directives 03/02/2021 03/09/2019 03/07/2018 02/07/2017 11/23/2016 05/14/2015 10/30/2011  Does Patient Have a Medical Advance Directive? Yes Yes Yes No Yes No;Yes Patient does not have advance directive  Type of Advance Directive [place] will [place]ion[place] ParkLiving will [place]Living will - [place] will [place] will -  Does patient want to make changes to medical advance directive? - No - Patient declined No - Patient declined - No -  Patient declined No - Patient declined -  Copy of [place] Chart? No - copy requested No - copy requested No - copy requested - No - copy requested - -  Would patient like information on creating a medical advance directive? - - - No - Patient declined - No - patient declined information -  Pre-existing out of facility DNR order (yellow form or pink MOST form) - - - - - - No    Current Medications (verified) Outpatient Encounter Medications as of 03/02/2021  Medication Sig   albuterol (VENTOLIN HFA) 108 (90 Base) MCG/ACT inhaler TAKE 2 PUFFS BY MOUTH EVERY 6 HOURS AS NEEDED FOR WHEEZE OR SHORTNESS OF BREATH   AMITIZA 24 MCG capsule Take 24 mcg by mouth 2 (two) times daily.   ARIPiprazole (ABILIFY) 2 MG tablet Take 1 tablet (2 mg total) by mouth daily.   baclofen (LIORESAL) 10 MG tablet Take 10 mg by mouth 3 (three) times daily.   budesonide-formoterol (SYMBICORT) 160-4.5 MCG/ACT inhaler Inhale 2 puffs into the lungs 2 (two) times daily.   buPROPion (WELLBUTRIN XL) 150 MG 24 hr tablet Take 1 tablet (150 mg total) by mouth daily.   Cholecalciferol (VITAMIN D3) 5000 units CAPS Take 5,000 Units by mouth.   doxepin (SINEQUAN) 10 MG capsule Take 1 capsule (10 mg total) by mouth at bedtime as needed (insomnia).   fenofibrate 160 MG tablet Take 1 tablet (160 mg total) by mouth daily. (NEEDS TO BE SEEN BEFORE NEXT REFILL)   glucose blood (ACCU-CHEK AVIVA PLUS) test strip TEST BLOOD DAILY AND AS NEEDED DX E11.22   levothyroxine (SYNTHROID) 200 MCG tablet Take 1 tablet (200 mcg  total) by mouth daily before breakfast. (NEEDS TO BE SEEN BEFORE NEXT REFILL)   morphine (MS CONTIN) 30 MG 12 hr tablet Take 30 mg by mouth every 8 (eight) hours.   oxyCODONE-acetaminophen (PERCOCET) 7.5-325 MG tablet TAKE 1 TABLET BY MOUTH EVERY FOUR HOURS AS NEEDED FOR PAIN   pantoprazole (PROTONIX) 20 MG tablet Take 1 tablet (20 mg total) by mouth daily. (Needs to be seen before next refill)   pregabalin  (LYRICA) 75 MG capsule Take 75 mg by mouth 3 (three) times daily.   QUEtiapine (SEROQUEL) 200 MG tablet Take 200 mg by mouth at bedtime.   rizatriptan (MAXALT) 5 MG tablet Take 1 tablet (5 mg total) by mouth as needed for migraine. May repeat in 2 hrs if needed. (NEEDS TO BE SEEN BEFORE NEXT REFILL)   rosuvastatin (CRESTOR) 10 MG tablet TAKE 1 TABLET BY MOUTH EVERY DAY   sertraline (ZOLOFT) 100 MG tablet Take 1.5 tablets (150 mg total) by mouth daily.   tamsulosin (FLOMAX) 0.4 MG CAPS capsule TAKE 1 CAPSULE BY MOUTH EVERY DAY   Aclidinium Bromide (TUDORZA PRESSAIR) 400 MCG/ACT AEPB Inhale 1 Inhaler into the lungs 2 (two) times daily. (Patient not taking: Reported on 03/02/2021)   lisinopril (ZESTRIL) 20 MG tablet Take 1 tablet (20 mg total) by mouth daily. (Patient not taking: Reported on 03/02/2021)   tiZANidine (ZANAFLEX) 2 MG tablet Take 4 mg by mouth 4 (four) times daily as needed. (Patient not taking: Reported on 03/02/2021)   triamcinolone cream (KENALOG) 0.1 % APPLY TO AFFECTED AREA TWICE A DAY (Patient not taking: Reported on 03/02/2021)   [DISCONTINUED] predniSONE (DELTASONE) 20 MG tablet Take 3 tabs daily for 1 week, then 2 tabs daily for week 2, then 1 tab daily for week 3.   No facility-administered encounter medications on file as of 03/02/2021.    Allergies (verified) Patient has no known allergies.   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   Family History  Problem Relation Age of Onset   Emphysema Sister    Prostate cancer Maternal Grandfather    Clotting disorder Maternal Grandfather    Cancer Maternal  Grandmother        brain   Depression Maternal Uncle    Atopy Neg Hx    Social History   Socioeconomic History   Marital status: Married    Spouse name: Joseph [name]   Number of children: 2   Years of education: 12   Highest education level: High school graduate  Occupational History   Occupation: disability  Tobacco Use   Smoking status: Former    Packs/day: 3.00    Years: 30.00    Pack years: 90.00    Types: Cigarettes    Quit date: 07/20/2003    Years since quitting: 17.6   Smokeless tobacco: Never  Vaping Use   Vaping Use: Never used  Substance and Sexual Activity   Alcohol use: Not Currently    Comment: per week   Drug use: No   Sexual activity: Yes  Other  Topics Concern   Not on file  Social History Narrative   Married with 2 children   Social Determinants of Health   Financial Resource Strain: High Risk   Difficulty of Paying Living Expenses: Hard  Food Insecurity: Food Insecurity Present   Worried About Charity fundraiser in the Last Year: Sometimes true   Arboriculturist in the Last Year: Never true  Transportation Needs: No Transportation Needs   Lack of Transportation (Medical): No   Lack of Transportation (Non-Medical): No  Physical Activity: Insufficiently Active   Days of Exercise per Week: 3 days   Minutes of Exercise per Session: 20 min  Stress: Stress Concern Present   Feeling of Stress : To some extent  Social Connections: Unknown   Frequency of Communication with Friends and Family: Once a week   Frequency of Social Gatherings with Friends and Family: Once a week   Attends Religious Services: Never   Marine scientist or Organizations: No   Attends Music therapist: Never   Marital Status: Not on file    Tobacco Counseling Counseling given: Not Answered   Clinical Intake:  Pre-visit preparation completed: Yes  Pain : 0-10 Pain Score: 7  Pain Type: Chronic pain Pain Location: Back Pain Orientation: Lower, Mid Pain  Descriptors / Indicators: Aching, Discomfort, Tender, Restless, Sharp, Sore Pain Onset: More than a month ago Pain Frequency: Intermittent     BMI - recorded: 30.81 Nutritional Status: BMI > 30  Obese Nutritional Risks: None Diabetes: Yes CBG done?: No Did pt. bring in CBG monitor from home?: No  How often do you need to have someone help you when you read instructions, pamphlets, or other written materials from your doctor or pharmacy?: 1 - Never  Diabetic?Nutrition Risk Assessment:  Has the patient had any N/V/D within the last 2 months?  No  Does the patient have any non-healing wounds?  No  Has the patient had any unintentional weight loss or weight gain?  No   Diabetes:  Is the patient diabetic?  Yes  If diabetic, was a CBG obtained today?  No  Did the patient bring in their glucometer from home?  No  How often do you monitor your CBG's? 4-5 times per week.   Financial Strains and Diabetes Management:  Are you having any financial strains with the device, your supplies or your medication? No .  Does the patient want to be seen by Chronic Care Management for management of their diabetes?  No  Would the patient like to be referred to a Nutritionist or for Diabetic Management?  No   Diabetic Exams:  Diabetic Eye Exam: Completed 2021.  Diabetic Foot Exam: Completed 01/29/2019. Pt has been advised about the importance in completing this exam. Pt is scheduled for diabetic foot exam on 03/26/21.    Interpreter Needed?: No  Information entered by :: Ciearra Rufo, LPN   Activities of Daily Living In your present state of health, do you have any difficulty performing the following activities: 03/02/2021  Hearing? N  Vision? N  Difficulty concentrating or making decisions? Y  Walking or climbing stairs? Y  Dressing or bathing? N  Doing errands, shopping? N  Preparing Food and eating ? N  Using the Toilet? N  In the past six months, have you accidently leaked urine? N  Do  you have problems with loss of bowel control? N  Managing your Medications? N  Managing your Finances? N  Housekeeping or  managing your Housekeeping? N  Some recent data might be hidden    Patient Care Team: Dettinger, Fransisca Kaufmann, MD as PCP - General (Family Medicine) Margaretha Sheffield, MD as Referring Physician (Physical Medicine and Rehabilitation) Services, [facility] as Referring Physician (Psychiatry) Festus Aloe, MD as Consulting Physician (Urology)  Indicate any recent Medical Services you may have received from other than [facility] providers in the past year (date may be approximate).     Assessment:   This is a routine wellness examination for [place].  Hearing/Vision screen Hearing Screening - Comments:: Denies hearing difficulties  Vision Screening - Comments:: Wears reading glasses only prn - up to date with annual eye exams at [place] issues and exercise activities discussed: Current Exercise Habits: Home exercise routine, Type of exercise: walking;stretching, Time (Minutes): 20, Frequency (Times/Week): 3, Weekly Exercise (Minutes/Week): 60, Intensity: Moderate, Exercise limited by: orthopedic condition(s);psychological condition(s);respiratory conditions(s)   Goals Addressed             This Visit's Progress    Exercise 3x per week (30 min per time)   Not on track    Have 3 meals a day   On track      Depression Screen PHQ 2/9 Scores 03/02/2021 08/29/2020 08/15/2020 08/01/2019 03/09/2019 01/29/2019 09/29/2018  PHQ - 2 Score '2 6 6 '$ 0 0 0 0  PHQ- 9 Score 6 - - - - - -  Some encounter information is confidential and restricted. Go to Review Flowsheets activity to see all data.    Fall Risk Fall Risk  03/02/2021 08/29/2020 08/15/2020 08/01/2019 03/09/2019  Falls in the past year? 0 0 0 0 0  Number falls in past yr: 0 - - - 0  Injury with Fall? 0 - - - 0  Risk for fall due to : Impaired balance/gait;Orthopedic patient;Medication side effect - - - -   Follow up Education provided;Falls prevention discussed - - - -    FALL RISK PREVENTION PERTAINING TO THE HOME:  Any stairs in or around the home? No  If so, are there any without handrails? No  Home free of loose throw rugs in walkways, pet beds, electrical cords, etc? Yes  Adequate lighting in your home to reduce risk of falls? Yes   ASSISTIVE DEVICES UTILIZED TO PREVENT FALLS:  Life alert? No  Use of a cane, walker or w/c? Yes  Grab bars in the bathroom? No  Shower chair or bench in shower? No  Elevated toilet seat or a handicapped toilet? No   TIMED UP AND GO:  Was the test performed? No . Telephonic visit  Cognitive Function: MMSE - Mini Mental State Exam 03/07/2018 11/23/2016  Orientation to time 5 5  Orientation to Place 4 5  Registration 3 3  Attention/ Calculation 5 5  Recall 3 3  Language- name 2 objects 2 2  Language- repeat 1 1  Language- follow 3 step command 3 3  Language- read & follow direction 1 1  Write a sentence 1 1  Copy design 1 1  Total score 29 30     6CIT Screen 03/02/2021 03/09/2019  What Year? 0 points 0 points  What month? 0 points 0 points  What time? 0 points 0 points  Count back from 20 0 points 0 points  Months in reverse 0 points 0 points  Repeat phrase 0 points 2 points  Total Score 0 2    Immunizations Immunization History  Administered Date(s) Administered   Influenza Nasal 04/28/2012  Influenza,inj,Quad PF,6+ Mos 06/12/2013, 04/14/2016, 07/27/2017, 06/08/2018, 04/09/2019, 08/15/2020   Influenza-Unspecified 05/19/2009, 05/01/2010, 05/22/2015   Pneumococcal Conjugate-13 07/27/2017   Pneumococcal Polysaccharide-23 07/31/2018   Pneumococcal-Unspecified 04/28/2012   Rabies, IM 02/07/2017, 02/10/2017, 02/14/2017, 02/21/2017   Td 12/17/2011   Tdap 12/17/2011    TDAP status: Up to date  Flu Vaccine status: Up to date  Pneumococcal vaccine status: Up to date  Covid-19 vaccine status: Declined, Education has been provided  regarding the importance of this vaccine but patient still declined. Advised may receive this vaccine at local pharmacy or Health Dept.or vaccine clinic. Aware to provide a copy of the vaccination record if obtained from local pharmacy or Health Dept. Verbalized acceptance and understanding.  Qualifies for Shingles Vaccine? Yes   Zostavax completed No   Shingrix Completed?: No.    Education has been provided regarding the importance of this vaccine. Patient has been advised to call insurance company to determine out of pocket expense if they have not yet received this vaccine. Advised may also receive vaccine at local pharmacy or Health Dept. Verbalized acceptance and understanding.  Screening Tests Health Maintenance  Topic Date Due   COVID-19 Vaccine (1) Never done   Zoster Vaccines- Shingrix (1 of 2) Never done   OPHTHALMOLOGY EXAM  07/15/2018   FOOT EXAM  01/29/2020   HEMOGLOBIN A1C  01/29/2020   INFLUENZA VACCINE  02/16/2021   COLONOSCOPY (Pts 45-84yr Insurance coverage will need to be confirmed)  08/15/2021 (Originally 09/05/2005)   TETANUS/TDAP  12/16/2021   Pneumococcal Vaccine [id number] old (4 - PPSV23 or PCV20) 09/05/2025   PNEUMOCOCCAL POLYSACCHARIDE VACCINE AGE 37-64 HIGH RISK  Completed   Hepatitis C Screening  Completed   HIV Screening  Completed   HPV VACCINES  Aged Out   COLON CANCER SCREENING ANNUAL FOBT  Discontinued    Health Maintenance  Health Maintenance Due  Topic Date Due   COVID-19 Vaccine (1) Never done   Zoster Vaccines- Shingrix (1 of 2) Never done   OPHTHALMOLOGY EXAM  07/15/2018   FOOT EXAM  01/29/2020   HEMOGLOBIN A1C  01/29/2020   INFLUENZA VACCINE  02/16/2021    Colorectal Cancer Screening: ordered cologuard today  Lung Cancer Screening: (Low Dose CT Chest recommended if Age 60-80years, 30 pack-year currently smoking OR have quit w/in 15years.) does not qualify.   Additional Screening:  Hepatitis C Screening: does qualify; Completed  04/14/2016  Vision Screening: Recommended annual ophthalmology exams for early detection of glaucoma and other disorders of the eye. Is the patient up to date with their annual eye exam?  Yes  Who is the provider or what is the name of the office in which the patient attends annual eye exams? [facility] is not established with a provider, would they like to be referred to a provider to establish care? No .   Dental Screening: Recommended annual dental exams for proper oral hygiene  Community Resource Referral / Chronic Care Management: CRR required this visit?  No   CCM required this visit?  No      Plan:     I have personally reviewed and noted the following in the patient's chart:   Medical and social history Use of alcohol, tobacco or illicit drugs  Current medications and supplements including opioid prescriptions. Patient is currently taking opioid prescriptions. Information provided to patient regarding non-opioid alternatives. Patient advised to discuss non-opioid treatment plan with their provider. Functional ability and status Nutritional status Physical activity Advanced directives List of  other physicians Hospitalizations, surgeries, and ER visits in previous 12 months Vitals Screenings to include cognitive, depression, and falls Referrals and appointments  In addition, I have reviewed and discussed with patient certain preventive protocols, quality metrics, and best practice recommendations. A written personalized care plan for preventive services as well as general preventive health recommendations were provided to patient.     Sandrea Hammond, LPN   [id number]   Nurse Notes: None

## 2021-03-02 NOTE — Telephone Encounter (Signed)
Called and spoke with patient and advised Dettinger would have to look at it to determine if he needed fluid drawn off and if he could get injection. Patient aware and verbalized understanding. States he will let Dr. Warrick Parisian look at it in visit.

## 2021-03-06 ENCOUNTER — Other Ambulatory Visit: Payer: Self-pay | Admitting: Family Medicine

## 2021-03-06 DIAGNOSIS — E1169 Type 2 diabetes mellitus with other specified complication: Secondary | ICD-10-CM

## 2021-03-06 DIAGNOSIS — E785 Hyperlipidemia, unspecified: Secondary | ICD-10-CM

## 2021-03-06 DIAGNOSIS — K219 Gastro-esophageal reflux disease without esophagitis: Secondary | ICD-10-CM

## 2021-03-11 ENCOUNTER — Other Ambulatory Visit: Payer: Self-pay | Admitting: Family Medicine

## 2021-03-12 DIAGNOSIS — Z1211 Encounter for screening for malignant neoplasm of colon: Secondary | ICD-10-CM | POA: Diagnosis not present

## 2021-03-19 ENCOUNTER — Ambulatory Visit: Payer: Medicare Other | Admitting: Family Medicine

## 2021-03-19 DIAGNOSIS — E1142 Type 2 diabetes mellitus with diabetic polyneuropathy: Secondary | ICD-10-CM | POA: Diagnosis not present

## 2021-03-19 DIAGNOSIS — G894 Chronic pain syndrome: Secondary | ICD-10-CM | POA: Diagnosis not present

## 2021-03-19 DIAGNOSIS — M47817 Spondylosis without myelopathy or radiculopathy, lumbosacral region: Secondary | ICD-10-CM | POA: Diagnosis not present

## 2021-03-19 DIAGNOSIS — K5900 Constipation, unspecified: Secondary | ICD-10-CM | POA: Diagnosis not present

## 2021-03-20 LAB — COLOGUARD: Cologuard: NEGATIVE

## 2021-03-26 ENCOUNTER — Other Ambulatory Visit: Payer: Self-pay

## 2021-03-26 ENCOUNTER — Ambulatory Visit (INDEPENDENT_AMBULATORY_CARE_PROVIDER_SITE_OTHER): Payer: Medicare Other | Admitting: Family Medicine

## 2021-03-26 ENCOUNTER — Other Ambulatory Visit: Payer: Self-pay | Admitting: Family Medicine

## 2021-03-26 ENCOUNTER — Encounter: Payer: Self-pay | Admitting: Family Medicine

## 2021-03-26 VITALS — BP 128/81 | HR 82 | Ht 74.0 in | Wt 222.0 lb

## 2021-03-26 DIAGNOSIS — J439 Emphysema, unspecified: Secondary | ICD-10-CM

## 2021-03-26 DIAGNOSIS — Z794 Long term (current) use of insulin: Secondary | ICD-10-CM | POA: Diagnosis not present

## 2021-03-26 DIAGNOSIS — I152 Hypertension secondary to endocrine disorders: Secondary | ICD-10-CM | POA: Diagnosis not present

## 2021-03-26 DIAGNOSIS — E0822 Diabetes mellitus due to underlying condition with diabetic chronic kidney disease: Secondary | ICD-10-CM | POA: Diagnosis not present

## 2021-03-26 DIAGNOSIS — E039 Hypothyroidism, unspecified: Secondary | ICD-10-CM | POA: Diagnosis not present

## 2021-03-26 DIAGNOSIS — E1169 Type 2 diabetes mellitus with other specified complication: Secondary | ICD-10-CM

## 2021-03-26 DIAGNOSIS — E782 Mixed hyperlipidemia: Secondary | ICD-10-CM | POA: Diagnosis not present

## 2021-03-26 DIAGNOSIS — E785 Hyperlipidemia, unspecified: Secondary | ICD-10-CM

## 2021-03-26 DIAGNOSIS — N1831 Chronic kidney disease, stage 3a: Secondary | ICD-10-CM | POA: Diagnosis not present

## 2021-03-26 DIAGNOSIS — E1159 Type 2 diabetes mellitus with other circulatory complications: Secondary | ICD-10-CM | POA: Diagnosis not present

## 2021-03-26 DIAGNOSIS — M25522 Pain in left elbow: Secondary | ICD-10-CM

## 2021-03-26 DIAGNOSIS — M778 Other enthesopathies, not elsewhere classified: Secondary | ICD-10-CM

## 2021-03-26 LAB — BAYER DCA HB A1C WAIVED: HB A1C (BAYER DCA - WAIVED): 6.1 % — ABNORMAL HIGH (ref 4.8–5.6)

## 2021-03-26 MED ORDER — METHYLPREDNISOLONE ACETATE 40 MG/ML IJ SUSP
40.0000 mg | Freq: Once | INTRAMUSCULAR | Status: AC
  Administered 2021-03-26: 40 mg via INTRAMUSCULAR

## 2021-03-26 MED ORDER — RIZATRIPTAN BENZOATE 5 MG PO TABS: 5.0000 mg | ORAL_TABLET | ORAL | 5 refills | Status: DC | PRN

## 2021-03-26 MED ORDER — TAMSULOSIN HCL 0.4 MG PO CAPS: 0.4000 mg | ORAL_CAPSULE | Freq: Every day | ORAL | 3 refills | Status: DC

## 2021-03-26 MED ORDER — TUDORZA PRESSAIR 400 MCG/ACT IN AEPB: 1.0000 | INHALATION_SPRAY | Freq: Two times a day (BID) | RESPIRATORY_TRACT | 3 refills | Status: DC

## 2021-03-26 MED ORDER — LISINOPRIL 20 MG PO TABS: 20.0000 mg | ORAL_TABLET | Freq: Every day | ORAL | 3 refills | Status: DC

## 2021-03-26 MED ORDER — LEVOTHYROXINE SODIUM 200 MCG PO TABS: 200.0000 ug | ORAL_TABLET | Freq: Every day | ORAL | 3 refills | Status: DC

## 2021-03-26 MED ORDER — FENOFIBRATE 160 MG PO TABS: 160.0000 mg | ORAL_TABLET | Freq: Every day | ORAL | 3 refills | Status: DC

## 2021-03-26 MED ORDER — ALBUTEROL SULFATE HFA 108 (90 BASE) MCG/ACT IN AERS: INHALATION_SPRAY | RESPIRATORY_TRACT | 3 refills | Status: DC

## 2021-03-26 MED ORDER — ROSUVASTATIN CALCIUM 10 MG PO TABS: 10.0000 mg | ORAL_TABLET | Freq: Every day | ORAL | 3 refills | Status: DC

## 2021-03-26 NOTE — Progress Notes (Signed)
BP 128/81   Pulse 82   Ht 6' 2"  (1.88 m)   Wt 222 lb (100.7 kg)   SpO2 100%   BMI 28.50 kg/m    Subjective:   Patient ID: Joseph [name], male    DOB: 1960/12/07, 60 y.o.   MRN: [id number]  HPI: Joseph [name] is a 60 y.o. male presenting on 03/26/2021 for Medical Management of Chronic Issues, Hyperlipidemia, Hypertension, and Hypothyroidism   HPI Hypothyroidism recheck Patient is coming in for thyroid recheck today as well. They deny any issues with hair changes or heat or cold problems or diarrhea or constipation. They deny any chest pain or palpitations. They are currently on levothyroxine 251mcrograms   Type 2 diabetes mellitus Patient comes in today for recheck of his diabetes. Patient has been currently taking no medication currently has been diet controlled. Patient is currently on an ACE inhibitor/ARB. Patient has not seen an ophthalmologist this year. Patient denies any issues with their feet. The symptom started onset as an adult hyperlipidemia and hypothyroidism and CKD ARE RELATED TO DM   Hyperlipidemia Patient is coming in for recheck of his hyperlipidemia. The patient is currently taking fenofibrate and Crestor. They deny any issues with myalgias or history of liver damage from it. They deny any focal numbness or weakness or chest pain.   Patient sees Dr. HModesta Messingfor anxiety depression  Patient is coming in complaining of left elbow pain on the lateral aspect of his elbow that is been bothering him over the past few weeks.  He does not note any trauma or any specific thing that he did to it but has been a little swollen there and very sore to touch.  Patient has been having some trouble with his COPD.  He did not get the Symbicort inhalers due to price.  Relevant past medical, surgical, family and social history reviewed and updated as indicated. Interim medical history since our last visit reviewed. Allergies and medications reviewed and updated.  Review of  Systems  Constitutional:  Negative for chills and fever.  Respiratory:  Positive for cough and wheezing. Negative for shortness of breath.   Cardiovascular:  Negative for chest pain and leg swelling.  Musculoskeletal:  Positive for arthralgias and joint swelling. Negative for back pain and gait problem.  Skin:  Negative for rash.  All other systems reviewed and are negative.  Per HPI unless specifically indicated above   Allergies as of 03/26/2021   No Known Allergies      Medication List        Accurate as of March 26, 2021  1:50 PM. If you have any questions, ask your nurse or doctor.          STOP taking these medications    budesonide-formoterol 160-4.5 MCG/ACT inhaler Commonly known as: SYMBICORT Stopped by: JFransisca KaufmannDettinger, MD   tiZANidine 2 MG tablet Commonly known as: ZANAFLEX Stopped by: JFransisca KaufmannDettinger, MD   triamcinolone cream 0.1 % Commonly known as: KENALOG Stopped by: JFransisca KaufmannDettinger, MD       TAKE these medications    Accu-Chek Aviva Plus test strip Generic drug: glucose blood TEST BLOOD DAILY AND AS NEEDED DX E11.22   albuterol 108 (90 Base) MCG/ACT inhaler Commonly known as: VENTOLIN HFA TAKE 2 PUFFS BY MOUTH EVERY 6 HOURS AS NEEDED FOR WHEEZE OR SHORTNESS OF BREATH   Amitiza 24 MCG capsule Generic drug: lubiprostone Take 24 mcg by mouth 2 (two) times daily.   ARIPiprazole  2 MG tablet Commonly known as: ABILIFY Take 1 tablet (2 mg total) by mouth daily.   baclofen 10 MG tablet Commonly known as: LIORESAL Take 10 mg by mouth 3 (three) times daily.   buPROPion 150 MG 24 hr tablet Commonly known as: WELLBUTRIN XL Take 1 tablet (150 mg total) by mouth daily.   doxepin 10 MG capsule Commonly known as: SINEQUAN Take 1 capsule (10 mg total) by mouth at bedtime as needed (insomnia).   fenofibrate 160 MG tablet Take 1 tablet (160 mg total) by mouth daily.   levothyroxine 200 MCG tablet Commonly known as: SYNTHROID Take 1  tablet (200 mcg total) by mouth daily before breakfast.   lisinopril 20 MG tablet Commonly known as: ZESTRIL Take 1 tablet (20 mg total) by mouth daily.   morphine 30 MG 12 hr tablet Commonly known as: MS CONTIN Take 30 mg by mouth every 8 (eight) hours.   oxyCODONE-acetaminophen 7.5-325 MG tablet Commonly known as: PERCOCET TAKE 1 TABLET BY MOUTH EVERY FOUR HOURS AS NEEDED FOR PAIN   pantoprazole 20 MG tablet Commonly known as: PROTONIX Take 1 tablet (20 mg total) by mouth daily.   pregabalin 75 MG capsule Commonly known as: LYRICA Take 75 mg by mouth 3 (three) times daily.   QUEtiapine 200 MG tablet Commonly known as: SEROQUEL Take 200 mg by mouth at bedtime.   rizatriptan 5 MG tablet Commonly known as: MAXALT Take 1 tablet (5 mg total) by mouth as needed for migraine. May repeat in 2 hrs if needed. What changed: additional instructions Changed by: Fransisca Kaufmann Bedie Dominey, MD   rosuvastatin 10 MG tablet Commonly known as: CRESTOR Take 1 tablet (10 mg total) by mouth daily.   sertraline 100 MG tablet Commonly known as: ZOLOFT Take 1.5 tablets (150 mg total) by mouth daily.   tamsulosin 0.4 MG Caps capsule Commonly known as: FLOMAX Take 1 capsule (0.4 mg total) by mouth daily.   Tudorza Pressair 400 MCG/ACT Aepb Generic drug: Aclidinium Bromide Inhale 1 Inhaler into the lungs 2 (two) times daily.   Vitamin D3 125 MCG (5000 UT) Caps Take 5,000 Units by mouth.         Objective:   BP 128/81   Pulse 82   Ht 6' 2"  (1.88 m)   Wt 222 lb (100.7 kg)   SpO2 100%   BMI 28.50 kg/m   Wt Readings from Last 3 Encounters:  03/26/21 222 lb (100.7 kg)  03/02/21 240 lb (108.9 kg)  08/29/20 236 lb (107 kg)    Physical Exam Vitals and nursing note reviewed.  Constitutional:      General: He is not in acute distress.    Appearance: He is well-developed. He is not diaphoretic.  Eyes:     General: No scleral icterus.    Conjunctiva/sclera: Conjunctivae normal.  Neck:      Thyroid: No thyromegaly.  Cardiovascular:     Rate and Rhythm: Normal rate and regular rhythm.     Heart sounds: Normal heart sounds. No murmur heard. Pulmonary:     Effort: Pulmonary effort is normal. No respiratory distress.     Breath sounds: No stridor. Wheezing present. No rhonchi or rales.  Chest:     Chest wall: No tenderness.  Musculoskeletal:        General: Normal range of motion.     Left elbow: Swelling present. No deformity or effusion. Normal range of motion. Tenderness present in lateral epicondyle.     Cervical back: Neck supple.  Lymphadenopathy:     Cervical: No cervical adenopathy.  Skin:    General: Skin is warm and dry.     Findings: No rash.  Neurological:     Mental Status: He is alert and oriented to person, place, and time.     Coordination: Coordination normal.  Psychiatric:        Behavior: Behavior normal.    Left elbow tendinitis injection: Consent form signed. Risk factors of bleeding and infection discussed with patient and patient is agreeable towards injection. Patient prepped with Betadine. Lateral approach towards injection used. Injected 40 mg of Depo-Medrol and 1 mL of 2% lidocaine. Patient tolerated procedure well and no side effects from noted. Minimal to no bleeding. Simple bandage applied after.   Assessment & Plan:   Problem List Items Addressed This Visit       Endocrine   Hypothyroidism - Primary   Relevant Medications   levothyroxine (SYNTHROID) 200 MCG tablet   Other Relevant Orders   TSH   Diabetes mellitus with chronic kidney disease (HCC)   Relevant Medications   lisinopril (ZESTRIL) 20 MG tablet   rosuvastatin (CRESTOR) 10 MG tablet   Hyperlipidemia associated with type 2 diabetes mellitus (HCC)   Relevant Medications   fenofibrate 160 MG tablet   levothyroxine (SYNTHROID) 200 MCG tablet   lisinopril (ZESTRIL) 20 MG tablet   rizatriptan (MAXALT) 5 MG tablet   rosuvastatin (CRESTOR) 10 MG tablet   Aclidinium  Bromide (TUDORZA PRESSAIR) 400 MCG/ACT AEPB   albuterol (VENTOLIN HFA) 108 (90 Base) MCG/ACT inhaler   Other Relevant Orders   CBC with Differential/Platelet   CMP14+EGFR   Lipid panel   TSH   Bayer DCA Hb A1c Waived   Other Visit Diagnoses     Hypertension associated with diabetes (HCC)       Relevant Medications   fenofibrate 160 MG tablet   levothyroxine (SYNTHROID) 200 MCG tablet   lisinopril (ZESTRIL) 20 MG tablet   rizatriptan (MAXALT) 5 MG tablet   rosuvastatin (CRESTOR) 10 MG tablet   Aclidinium Bromide (TUDORZA PRESSAIR) 400 MCG/ACT AEPB   albuterol (VENTOLIN HFA) 108 (90 Base) MCG/ACT inhaler   Other Relevant Orders   CBC with Differential/Platelet   CMP14+EGFR   Lipid panel   TSH   Bayer DCA Hb A1c Waived   Mixed hyperlipidemia       Relevant Medications   fenofibrate 160 MG tablet   levothyroxine (SYNTHROID) 200 MCG tablet   lisinopril (ZESTRIL) 20 MG tablet   rizatriptan (MAXALT) 5 MG tablet   rosuvastatin (CRESTOR) 10 MG tablet   Aclidinium Bromide (TUDORZA PRESSAIR) 400 MCG/ACT AEPB   albuterol (VENTOLIN HFA) 108 (90 Base) MCG/ACT inhaler   Other Relevant Orders   CBC with Differential/Platelet   CMP14+EGFR   Lipid panel   TSH   Bayer DCA Hb A1c Waived   Pulmonary emphysema, unspecified emphysema type (HCC)       Relevant Medications   Aclidinium Bromide (TUDORZA PRESSAIR) 400 MCG/ACT AEPB   albuterol (VENTOLIN HFA) 108 (90 Base) MCG/ACT inhaler   methylPREDNISolone acetate (DEPO-MEDROL) injection 40 mg (Start on 03/26/2021  2:00 PM)   Left elbow tendinitis       Relevant Medications   methylPREDNISolone acetate (DEPO-MEDROL) injection 40 mg (Start on 03/26/2021  2:00 PM)       Will check blood work, continue current medicines.  Gave sample for Breztri Follow up plan: Return in about 3 months (around 06/25/2021), or if symptoms worsen or fail to improve, for  Diabetes and hypertension and cholesterol and thyroid.  Counseling provided for all of the  vaccine components Orders Placed This Encounter  Procedures   CBC with Differential/Platelet   CMP14+EGFR   Lipid panel   TSH   Bayer DCA Hb A1c [place], MD [facility] 03/26/2021, 1:50 PM

## 2021-03-27 LAB — CMP14+EGFR
ALT: 20 IU/L (ref 0–44)
AST: 19 IU/L (ref 0–40)
Albumin/Globulin Ratio: 1.6 (ref 1.2–2.2)
Albumin: 4.1 g/dL (ref 3.8–4.9)
Alkaline Phosphatase: 33 IU/L — ABNORMAL LOW (ref 44–121)
BUN/Creatinine Ratio: 17 (ref 10–24)
BUN: 18 mg/dL (ref 8–27)
Bilirubin Total: <0.2 mg/dL (ref 0.0–1.2)
CO2: 25 mmol/L (ref 20–29)
Calcium: 9.7 mg/dL (ref 8.6–10.2)
Chloride: 102 mmol/L (ref 96–106)
Creatinine, Ser: 1.05 mg/dL (ref 0.76–1.27)
Globulin, Total: 2.5 g/dL (ref 1.5–4.5)
Glucose: 92 mg/dL (ref 65–99)
Potassium: 4.5 mmol/L (ref 3.5–5.2)
Sodium: 141 mmol/L (ref 134–144)
Total Protein: 6.6 g/dL (ref 6.0–8.5)
eGFR: 81 mL/min/{1.73_m2} (ref >59)

## 2021-03-27 LAB — LIPID PANEL
Chol/HDL Ratio: 4 ratio (ref 0.0–5.0)
Cholesterol, Total: 182 mg/dL (ref 100–199)
HDL: 46 mg/dL (ref >39)
LDL Chol Calc (NIH): 103 mg/dL — ABNORMAL HIGH (ref 0–99)
Triglycerides: 191 mg/dL — ABNORMAL HIGH (ref 0–149)
VLDL Cholesterol Cal: 33 mg/dL (ref 5–40)

## 2021-03-27 LAB — CBC WITH DIFFERENTIAL/PLATELET
Basophils Absolute: 0.1 10*3/uL (ref 0.0–0.2)
Basos: 1 %
EOS (ABSOLUTE): 0.1 10*3/uL (ref 0.0–0.4)
Eos: 2 %
Hematocrit: 40.5 % (ref 37.5–51.0)
Hemoglobin: 13.4 g/dL (ref 13.0–17.7)
Immature Grans (Abs): 0 10*3/uL (ref 0.0–0.1)
Immature Granulocytes: 0 %
Lymphocytes Absolute: 1.6 10*3/uL (ref 0.7–3.1)
Lymphs: 32 %
MCH: 28 pg (ref 26.6–33.0)
MCHC: 33.1 g/dL (ref 31.5–35.7)
MCV: 85 fL (ref 79–97)
Monocytes Absolute: 0.4 10*3/uL (ref 0.1–0.9)
Monocytes: 9 %
Neutrophils Absolute: 2.7 10*3/uL (ref 1.4–7.0)
Neutrophils: 56 %
Platelets: 196 10*3/uL (ref 150–450)
RBC: 4.79 x10E6/uL (ref 4.14–5.80)
RDW: 13.6 % (ref 11.6–15.4)
WBC: 5 10*3/uL (ref 3.4–10.8)

## 2021-03-27 LAB — TSH: TSH: 0.008 u[IU]/mL — ABNORMAL LOW (ref 0.450–4.500)

## 2021-03-31 NOTE — Telephone Encounter (Signed)
Sent spiriva as a replacement because tudorza was not coveered

## 2021-04-16 DIAGNOSIS — K5900 Constipation, unspecified: Secondary | ICD-10-CM | POA: Diagnosis not present

## 2021-04-16 DIAGNOSIS — M47817 Spondylosis without myelopathy or radiculopathy, lumbosacral region: Secondary | ICD-10-CM | POA: Diagnosis not present

## 2021-04-16 DIAGNOSIS — E1142 Type 2 diabetes mellitus with diabetic polyneuropathy: Secondary | ICD-10-CM | POA: Diagnosis not present

## 2021-04-16 DIAGNOSIS — G894 Chronic pain syndrome: Secondary | ICD-10-CM | POA: Diagnosis not present

## 2021-05-12 ENCOUNTER — Other Ambulatory Visit: Payer: Self-pay | Admitting: Family Medicine

## 2021-05-12 DIAGNOSIS — L249 Irritant contact dermatitis, unspecified cause: Secondary | ICD-10-CM

## 2021-05-13 DIAGNOSIS — G894 Chronic pain syndrome: Secondary | ICD-10-CM | POA: Diagnosis not present

## 2021-05-13 DIAGNOSIS — M47817 Spondylosis without myelopathy or radiculopathy, lumbosacral region: Secondary | ICD-10-CM | POA: Diagnosis not present

## 2021-05-13 DIAGNOSIS — E1142 Type 2 diabetes mellitus with diabetic polyneuropathy: Secondary | ICD-10-CM | POA: Diagnosis not present

## 2021-05-13 DIAGNOSIS — K5900 Constipation, unspecified: Secondary | ICD-10-CM | POA: Diagnosis not present
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
## 2021-05-21 ENCOUNTER — Encounter: Payer: Self-pay | Admitting: Psychiatry

## 2021-05-21 ENCOUNTER — Telehealth (INDEPENDENT_AMBULATORY_CARE_PROVIDER_SITE_OTHER): Payer: Medicare Other | Admitting: Psychiatry

## 2021-05-21 ENCOUNTER — Other Ambulatory Visit: Payer: Self-pay

## 2021-05-21 DIAGNOSIS — F330 Major depressive disorder, recurrent, mild: Secondary | ICD-10-CM

## 2021-05-21 MED ORDER — SERTRALINE HCL 100 MG PO TABS: 150.0000 mg | ORAL_TABLET | Freq: Every day | ORAL | 1 refills | Status: DC

## 2021-05-21 MED ORDER — ARIPIPRAZOLE 2 MG PO TABS: 2.0000 mg | ORAL_TABLET | Freq: Every day | ORAL | 0 refills | Status: DC

## 2021-05-21 MED ORDER — BUPROPION HCL ER (XL) 150 MG PO TB24: 150.0000 mg | ORAL_TABLET | Freq: Every day | ORAL | 1 refills | Status: DC

## 2021-05-21 MED ORDER — DOXEPIN HCL 10 MG PO CAPS: 10.0000 mg | ORAL_CAPSULE | Freq: Every evening | ORAL | 0 refills | Status: DC | PRN

## 2021-05-21 NOTE — Patient Instructions (Addendum)
1. Continue sertraline 150 mg daily  2. Continue bupropion 150 mg at night  3. Continue doxepin 10 mg at night as needed for sleep 4. Continue Abilify 2 mg at night  5. Discontinue quetiapine 6. Next appointment: 1/31 at 1 PM

## 2021-05-26 ENCOUNTER — Other Ambulatory Visit: Payer: Self-pay

## 2021-05-26 ENCOUNTER — Telehealth: Payer: Self-pay

## 2021-05-26 MED ORDER — LEVOTHYROXINE SODIUM 175 MCG PO TABS: 175.0000 ug | ORAL_TABLET | Freq: Every day | ORAL | 1 refills | Status: DC

## 2021-05-26 NOTE — Telephone Encounter (Signed)
Spoke with patient today. He was unaware that his thyroid medication had been changed.  We left him a message to call back after his last visit. He did not return call and no follow up was documented.  Sent in Levothyroxine 175 mcg to [facility] in [place]. Pt has a follow up in December scheduled. Pt made aware of this verbally.

## 2021-06-01 ENCOUNTER — Encounter: Payer: Self-pay | Admitting: Family Medicine

## 2021-06-01 ENCOUNTER — Ambulatory Visit (INDEPENDENT_AMBULATORY_CARE_PROVIDER_SITE_OTHER): Payer: Medicare Other | Admitting: Family Medicine

## 2021-06-01 VITALS — BP 150/101 | HR 94 | Temp 98.2°F

## 2021-06-01 DIAGNOSIS — J988 Other specified respiratory disorders: Secondary | ICD-10-CM

## 2021-06-01 DIAGNOSIS — R051 Acute cough: Secondary | ICD-10-CM | POA: Diagnosis not present

## 2021-06-01 MED ORDER — DOXYCYCLINE HYCLATE 100 MG PO TABS: 100.0000 mg | ORAL_TABLET | Freq: Two times a day (BID) | ORAL | 0 refills | Status: AC

## 2021-06-01 MED ORDER — PREDNISONE 20 MG PO TABS
40.0000 mg | ORAL_TABLET | Freq: Every day | ORAL | 0 refills | Status: AC
Start: 2021-06-01 — End: 2021-06-06

## 2021-06-01 NOTE — Progress Notes (Signed)
Acute Office Visit  Subjective:    Patient ID: Joseph [name], male    DOB: [birth year], 60 y.o.   MRN: [id number]  Chief Complaint  Patient presents with   Cough   chest congestion    HPI Patient is in today for cough and congestion. His cough has been productive with thick, gray sputum. He has a history of shortness of breath due to his COPD and laryngectomy but his has been worse for the last few days. He denies fever, body aches, chills, nausea, vomiting, or diarrhea. He has taken alka seltzer plus for his symptoms. He has pneumonia a few times in the past and this feels the same to him.   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    Family History  Problem Relation Age of Onset   Emphysema Sister    Prostate cancer Maternal Grandfather    Clotting disorder Maternal Grandfather    Cancer Maternal Grandmother        brain   Depression Maternal Uncle    Atopy Neg Hx     Social History   Socioeconomic History   Marital status: Married    Spouse name: [name]   Number of children: 2   Years of education: 12   Highest education level: High school graduate  Occupational History   Occupation: disability  Tobacco Use   Smoking status: Former    Packs/day: 3.00    Years: 30.00    Pack years: 90.00    Types: Cigarettes    Quit date: 07/20/2003    Years since quitting: 17.8   Smokeless tobacco: Never  Vaping Use   Vaping Use: Never used  Substance and Sexual Activity   Alcohol use: Not Currently    Comment: per week   Drug use: No   Sexual activity: Yes  Other Topics Concern   Not on file  Social  History Narrative   Married with 2 children   Social Determinants of Health   Financial Resource Strain: High Risk   Difficulty of Paying Living Expenses: Hard  Food Insecurity: Food Insecurity Present   Worried About Running Out of Food in the Last Year: Sometimes true   Ran Out of Food in the Last Year: Never true  Transportation Needs: No Transportation Needs   Lack of Transportation (Medical): No   Lack of Transportation (Non-Medical): No  Physical Activity: Insufficiently Active   Days of Exercise per Week: 3 days   Minutes of Exercise per Session: 20 min  Stress: Stress Concern Present   Feeling of Stress : To some extent  Social Connections: Unknown   Frequency of Communication with Friends and Family: Once a week   Frequency of Social Gatherings with Friends and Family: Once a week   Attends Religious Services: Never   Marine scientist or Organizations: No   Attends Archivist Meetings: Never   Marital Status: Not on file  Intimate Partner Violence: Not At Risk   Fear of Current or Ex-Partner: No  Emotionally Abused: No   Physically Abused: No   Sexually Abused: No    Outpatient Medications Prior to Visit  Medication Sig Dispense Refill   albuterol (VENTOLIN HFA) 108 (90 Base) MCG/ACT inhaler TAKE 2 PUFFS BY MOUTH EVERY 6 HOURS AS NEEDED FOR WHEEZE OR SHORTNESS OF BREATH 6.7 each 3   AMITIZA 24 MCG capsule Take 24 mcg by mouth 2 (two) times daily.     ARIPiprazole (ABILIFY) 2 MG tablet Take 1 tablet (2 mg total) by mouth daily. 90 tablet 0   baclofen (LIORESAL) 10 MG tablet Take 10 mg by mouth 3 (three) times daily.     [START ON 06/27/2021] buPROPion (WELLBUTRIN XL) 150 MG 24 hr tablet Take 1 tablet (150 mg total) by mouth daily. 90 tablet 1   Cholecalciferol (VITAMIN D3) 5000 units CAPS Take 5,000 Units by mouth.     doxepin (SINEQUAN) 10 MG capsule Take 1 capsule (10 mg total) by mouth at bedtime as needed (insomnia). 90 capsule 0   fenofibrate 160  MG tablet Take 1 tablet (160 mg total) by mouth daily. 90 tablet 3   glucose blood (ACCU-CHEK AVIVA PLUS) test strip TEST BLOOD DAILY AND AS NEEDED DX E11.22 100 strip 3   levothyroxine (SYNTHROID) 175 MCG tablet Take 1 tablet (175 mcg total) by mouth daily. 90 tablet 1   lisinopril (ZESTRIL) 20 MG tablet Take 1 tablet (20 mg total) by mouth daily. 90 tablet 3   methocarbamol (ROBAXIN) 500 MG tablet Take 500 mg by mouth 3 (three) times daily as needed.     morphine (MS CONTIN) 30 MG 12 hr tablet Take 30 mg by mouth every 8 (eight) hours.     oxyCODONE-acetaminophen (PERCOCET) 7.5-325 MG tablet TAKE 1 TABLET BY MOUTH EVERY FOUR HOURS AS NEEDED FOR PAIN     pantoprazole (PROTONIX) 20 MG tablet Take 1 tablet (20 mg total) by mouth daily. 90 tablet 1   pregabalin (LYRICA) 75 MG capsule Take 75 mg by mouth 3 (three) times daily.     rizatriptan (MAXALT) 5 MG tablet Take 1 tablet (5 mg total) by mouth as needed for migraine. May repeat in 2 hrs if needed. 10 tablet 5   rosuvastatin (CRESTOR) 10 MG tablet Take 1 tablet (10 mg total) by mouth daily. 90 tablet 3   [START ON 06/04/2021] sertraline (ZOLOFT) 100 MG tablet Take 1.5 tablets (150 mg total) by mouth daily. 135 tablet 1   tamsulosin (FLOMAX) 0.4 MG CAPS capsule Take 1 capsule (0.4 mg total) by mouth daily. 90 capsule 3   tiotropium (SPIRIVA HANDIHALER) 18 MCG inhalation capsule Place 1 capsule (18 mcg total) into inhaler and inhale daily. 90 capsule 3   No facility-administered medications prior to visit.    No Known Allergies  Review of Systems As pere HPI.     Objective:    Physical Exam Vitals and nursing note reviewed.  Constitutional:      General: He is not in acute distress.    Appearance: He is not toxic-appearing or diaphoretic.  HENT:     Nose: Congestion present.  Neck:     Trachea: Tracheostomy present.  Cardiovascular:     Rate and Rhythm: Normal rate and regular rhythm.     Heart sounds: Normal heart sounds. No  murmur heard. Pulmonary:     Breath sounds: Examination of the right-upper field reveals rhonchi. Examination of the left-upper field reveals rhonchi. Examination of the right-middle field reveals rhonchi. Examination of the left-middle field reveals  rhonchi. Examination of the right-lower field reveals rhonchi. Examination of the left-lower field reveals rhonchi. Rhonchi present. No decreased breath sounds, wheezing or rales.  Musculoskeletal:     Right lower leg: No edema.     Left lower leg: No edema.  Skin:    General: Skin is warm and dry.  Neurological:     Mental Status: He is alert and oriented to person, place, and time.  Psychiatric:        Mood and Affect: Mood normal.        Behavior: Behavior normal.    BP (!) 150/101   Pulse 94   Temp 98.2 F (36.8 C) (Temporal)   SpO2 94%  Wt Readings from Last 3 Encounters:  03/26/21 222 lb (100.7 kg)  03/02/21 240 lb (108.9 kg)  08/29/20 236 lb (107 kg)    Health Maintenance Due  Topic Date Due   INFLUENZA VACCINE  02/16/2021    There are no preventive care reminders to display for this patient.   Lab Results  Component Value Date   TSH 0.008 (L) 03/26/2021   Lab Results  Component Value Date   WBC 5.0 03/26/2021   HGB 13.4 03/26/2021   HCT 40.5 03/26/2021   MCV 85 03/26/2021   PLT 196 03/26/2021   Lab Results  Component Value Date   NA 141 03/26/2021   K 4.5 03/26/2021   CO2 25 03/26/2021   GLUCOSE 92 03/26/2021   BUN 18 03/26/2021   CREATININE 1.05 03/26/2021   BILITOT <0.2 03/26/2021   ALKPHOS 33 (L) 03/26/2021   AST 19 03/26/2021   ALT 20 03/26/2021   PROT 6.6 03/26/2021   ALBUMIN 4.1 03/26/2021   CALCIUM 9.7 03/26/2021   ANIONGAP 8 08/07/2020   EGFR 81 03/26/2021   Lab Results  Component Value Date   CHOL 182 03/26/2021   Lab Results  Component Value Date   HDL 46 03/26/2021   Lab Results  Component Value Date   LDLCALC 103 (H) 03/26/2021   Lab Results  Component Value Date   TRIG 191  (H) 03/26/2021   Lab Results  Component Value Date   CHOLHDL 4.0 03/26/2021   Lab Results  Component Value Date   HGBA1C 6.1 (H) 03/26/2021       Assessment & Plan:   Barrie was seen today for cough and chest congestion.  Diagnoses and all orders for this visit:  Respiratory infection Flu, Covid, and RSV pending. CXR unavailable today. Rhonchi present on exam. Given history of COPD, trach, and past pneumonia infections, will treat with doxycyline and prednisone today. Mucinex for cough and congestion, rest, and stay well hydrated. Quarantine until [employer], will notify patient of results. Strict return precautions given.  -     predniSONE (DELTASONE) 20 MG tablet; Take 2 tablets (40 mg total) by mouth daily with breakfast for 5 days. -     doxycycline (VIBRA-TABS) 100 MG tablet; Take 1 tablet (100 mg total) by mouth 2 (two) times daily for 10 days. 1 po bid -     COVID-19, Flu A+B and RSV  Return to office for new or worsening symptoms, or if symptoms persist.   The patient indicates understanding of these issues and agrees with the plan.   Gwenlyn Perking, FNP

## 2021-06-02 LAB — COVID-19, FLU A+B AND RSV
Influenza A, NAA: NOT DETECTED
Influenza B, NAA: NOT DETECTED
RSV, NAA: NOT DETECTED
SARS-CoV-2, NAA: NOT DETECTED

## 2021-06-12 ENCOUNTER — Other Ambulatory Visit: Payer: Self-pay | Admitting: Psychiatry

## 2021-06-12 ENCOUNTER — Other Ambulatory Visit: Payer: Self-pay | Admitting: Family Medicine

## 2021-06-12 DIAGNOSIS — K219 Gastro-esophageal reflux disease without esophagitis: Secondary | ICD-10-CM

## 2021-06-12 DIAGNOSIS — F330 Major depressive disorder, recurrent, mild: Secondary | ICD-10-CM

## 2021-06-15 DIAGNOSIS — K5900 Constipation, unspecified: Secondary | ICD-10-CM | POA: Diagnosis not present

## 2021-06-15 DIAGNOSIS — M47817 Spondylosis without myelopathy or radiculopathy, lumbosacral region: Secondary | ICD-10-CM | POA: Diagnosis not present

## 2021-06-15 DIAGNOSIS — E1142 Type 2 diabetes mellitus with diabetic polyneuropathy: Secondary | ICD-10-CM | POA: Diagnosis not present

## 2021-06-15 DIAGNOSIS — G894 Chronic pain syndrome: Secondary | ICD-10-CM | POA: Diagnosis not present

## 2021-06-26 ENCOUNTER — Encounter: Payer: Self-pay | Admitting: Family Medicine

## 2021-06-26 ENCOUNTER — Ambulatory Visit (INDEPENDENT_AMBULATORY_CARE_PROVIDER_SITE_OTHER): Payer: Medicare Other | Admitting: Family Medicine

## 2021-06-26 VITALS — BP 159/91 | HR 99 | Ht 74.0 in | Wt 236.0 lb

## 2021-06-26 DIAGNOSIS — E039 Hypothyroidism, unspecified: Secondary | ICD-10-CM | POA: Diagnosis not present

## 2021-06-26 DIAGNOSIS — Z23 Encounter for immunization: Secondary | ICD-10-CM | POA: Diagnosis not present

## 2021-06-26 DIAGNOSIS — E785 Hyperlipidemia, unspecified: Secondary | ICD-10-CM | POA: Diagnosis not present

## 2021-06-26 DIAGNOSIS — Z794 Long term (current) use of insulin: Secondary | ICD-10-CM | POA: Diagnosis not present

## 2021-06-26 DIAGNOSIS — N1831 Chronic kidney disease, stage 3a: Secondary | ICD-10-CM | POA: Diagnosis not present

## 2021-06-26 DIAGNOSIS — E1169 Type 2 diabetes mellitus with other specified complication: Secondary | ICD-10-CM | POA: Diagnosis not present

## 2021-06-26 DIAGNOSIS — E0822 Diabetes mellitus due to underlying condition with diabetic chronic kidney disease: Secondary | ICD-10-CM

## 2021-06-26 LAB — BAYER DCA HB A1C WAIVED: HB A1C (BAYER DCA - WAIVED): 6.4 % — ABNORMAL HIGH (ref 4.8–5.6)

## 2021-06-26 NOTE — Progress Notes (Signed)
BP (!) 159/91   Pulse 99   Ht 6\' 2"  (1.88 m)   Wt 236 lb (107 kg)   SpO2 100%   BMI 30.30 kg/m    Subjective:   Patient ID: Joseph [name], male    DOB: 27-Sep-1960, 60 y.o.   MRN: [id number]  HPI: LELAN CUSH is a 60 y.o. male presenting on 06/26/2021 for Medical Management of Chronic Issues, Diabetes, Hyperlipidemia, Hypothyroidism, and Hypertension   HPI Type 2 diabetes mellitus Patient comes in today for recheck of his diabetes. Patient has been currently taking no medication currently, has been diet controlled, A1c 6.4.. Patient is currently on an ACE inhibitor/ARB. Patient has seen an ophthalmologist this year. Patient denies any issues with their feet. The symptom started onset as an adult hypothyroidism and hyperlipidemia ARE RELATED TO DM   Hypothyroidism recheck Patient is coming in for thyroid recheck today as well. They deny any issues with hair changes or heat or cold problems or diarrhea or constipation. They deny any chest pain or palpitations. They are currently on levothyroxine 175 micrograms    Hyperlipidemia Patient is coming in for recheck of his hyperlipidemia. The patient is currently taking Crestor. They deny any issues with myalgias or history of liver damage from it. They deny any focal numbness or weakness or chest pain.   Relevant past medical, surgical, family and social history reviewed and updated as indicated. Interim medical history since our last visit reviewed. Allergies and medications reviewed and updated.  Review of Systems  Constitutional:  Negative for chills and fever.  Eyes:  Negative for visual disturbance.  Respiratory:  Negative for shortness of breath and wheezing.   Cardiovascular:  Negative for chest pain and leg swelling.  Musculoskeletal:  Negative for back pain and gait problem.  Skin:  Negative for rash.  Neurological:  Negative for dizziness, weakness and numbness.  All other systems reviewed and are negative.  Per  HPI unless specifically indicated above   Allergies as of 06/26/2021   No Known Allergies      Medication List        Accurate as of June 26, 2021  2:31 PM. If you have any questions, ask your nurse or doctor.          STOP taking these medications    tamsulosin 0.4 MG Caps capsule Commonly known as: FLOMAX Stopped by: Fransisca Kaufmann Christa Fasig, MD       TAKE these medications    Accu-Chek Aviva Plus test strip Generic drug: glucose blood TEST BLOOD DAILY AND AS NEEDED DX E11.22   albuterol 108 (90 Base) MCG/ACT inhaler Commonly known as: VENTOLIN HFA TAKE 2 PUFFS BY MOUTH EVERY 6 HOURS AS NEEDED FOR WHEEZE OR SHORTNESS OF BREATH   Amitiza 24 MCG capsule Generic drug: lubiprostone Take 24 mcg by mouth 2 (two) times daily.   ARIPiprazole 2 MG tablet Commonly known as: ABILIFY Take 1 tablet (2 mg total) by mouth daily.   baclofen 10 MG tablet Commonly known as: LIORESAL Take 10 mg by mouth 3 (three) times daily.   buPROPion 150 MG 24 hr tablet Commonly known as: WELLBUTRIN XL Take 1 tablet (150 mg total) by mouth daily. Start taking on: June 27, 2021   doxepin 10 MG capsule Commonly known as: SINEQUAN Take 1 capsule (10 mg total) by mouth at bedtime as needed (insomnia).   fenofibrate 160 MG tablet Take 1 tablet (160 mg total) by mouth daily.   levothyroxine 175 MCG tablet  Commonly known as: SYNTHROID Take 1 tablet (175 mcg total) by mouth daily.   lisinopril 20 MG tablet Commonly known as: ZESTRIL Take 1 tablet (20 mg total) by mouth daily.   methocarbamol 500 MG tablet Commonly known as: ROBAXIN Take 500 mg by mouth 3 (three) times daily as needed.   morphine 30 MG 12 hr tablet Commonly known as: MS CONTIN Take 30 mg by mouth every 8 (eight) hours.   oxyCODONE-acetaminophen 7.5-325 MG tablet Commonly known as: PERCOCET TAKE 1 TABLET BY MOUTH EVERY FOUR HOURS AS NEEDED FOR PAIN   pantoprazole 20 MG tablet Commonly known as:  PROTONIX TAKE 1 TABLET BY MOUTH EVERY DAY   pregabalin 75 MG capsule Commonly known as: LYRICA Take 75 mg by mouth 3 (three) times daily.   rizatriptan 5 MG tablet Commonly known as: MAXALT Take 1 tablet (5 mg total) by mouth as needed for migraine. May repeat in 2 hrs if needed.   rosuvastatin 10 MG tablet Commonly known as: CRESTOR Take 1 tablet (10 mg total) by mouth daily.   sertraline 100 MG tablet Commonly known as: ZOLOFT Take 1.5 tablets (150 mg total) by mouth daily.   Spiriva HandiHaler 18 MCG inhalation capsule Generic drug: tiotropium Place 1 capsule (18 mcg total) into inhaler and inhale daily.   Vitamin D3 125 MCG (5000 UT) Caps Take 5,000 Units by mouth.         Objective:   BP (!) 159/91   Pulse 99   Ht 6\' 2"  (1.88 m)   Wt 236 lb (107 kg)   SpO2 100%   BMI 30.30 kg/m   Wt Readings from Last 3 Encounters:  06/26/21 236 lb (107 kg)  03/26/21 222 lb (100.7 kg)  03/02/21 240 lb (108.9 kg)    Physical Exam Vitals and nursing note reviewed.  Constitutional:      General: He is not in acute distress.    Appearance: He is well-developed. He is not diaphoretic.  Eyes:     General: No scleral icterus.    Conjunctiva/sclera: Conjunctivae normal.  Neck:     Thyroid: No thyromegaly.  Cardiovascular:     Rate and Rhythm: Normal rate and regular rhythm.     Heart sounds: Normal heart sounds. No murmur heard. Pulmonary:     Effort: Pulmonary effort is normal. No respiratory distress.     Breath sounds: Normal breath sounds. No wheezing.  Musculoskeletal:        General: Normal range of motion.     Cervical back: Neck supple.  Lymphadenopathy:     Cervical: No cervical adenopathy.  Skin:    General: Skin is warm and dry.     Findings: No rash.  Neurological:     Mental Status: He is alert and oriented to person, place, and time.     Coordination: Coordination normal.  Psychiatric:        Behavior: Behavior normal.      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  Monitor blood pressure closely.  Patient's blood pressure is slightly elevated, Follow up plan: Return in about 3 months (around 09/24/2021), or if symptoms worsen or fail to improve, for Diabetes and thyroid and cholesterol.  Counseling provided for all of the vaccine components Orders Placed This Encounter  Procedures   Flu Vaccine QUAD 55mo+IM (Fluarix, Fluzone & Alfiuria Quad PF)   Bayer DCA Hb A1c Waived    Caryl Pina, MD [facility] 06/26/2021, 2:31 PM

## 2021-07-14 ENCOUNTER — Telehealth: Payer: Self-pay | Admitting: Family Medicine

## 2021-07-14 NOTE — Telephone Encounter (Signed)
I spoke to wife and advised pt would ntbs for evaluation to decide treatment. [name] wife states she would let him know and call us back if he wanted an appointment.

## 2021-07-14 NOTE — Telephone Encounter (Signed)
[name] wife called in and wanted more prednisone and antibiotics called in for pt because he is not feeling better. She is aware that he may need to be seen before this can be called in. Offered appt but she said that she wanted a message put in first. Please call back and advise.

## 2021-07-16 DIAGNOSIS — E1142 Type 2 diabetes mellitus with diabetic polyneuropathy: Secondary | ICD-10-CM | POA: Diagnosis not present

## 2021-07-16 DIAGNOSIS — G894 Chronic pain syndrome: Secondary | ICD-10-CM | POA: Diagnosis not present

## 2021-07-16 DIAGNOSIS — M47817 Spondylosis without myelopathy or radiculopathy, lumbosacral region: Secondary | ICD-10-CM | POA: Diagnosis not present

## 2021-07-16 DIAGNOSIS — K5900 Constipation, unspecified: Secondary | ICD-10-CM | POA: Diagnosis not present
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
## 2021-08-17 DIAGNOSIS — K5900 Constipation, unspecified: Secondary | ICD-10-CM | POA: Diagnosis not present

## 2021-08-17 DIAGNOSIS — G894 Chronic pain syndrome: Secondary | ICD-10-CM | POA: Diagnosis not present

## 2021-08-17 DIAGNOSIS — M47817 Spondylosis without myelopathy or radiculopathy, lumbosacral region: Secondary | ICD-10-CM | POA: Diagnosis not present

## 2021-08-17 DIAGNOSIS — Z79891 Long term (current) use of opiate analgesic: Secondary | ICD-10-CM | POA: Diagnosis not present

## 2021-08-17 DIAGNOSIS — E1142 Type 2 diabetes mellitus with diabetic polyneuropathy: Secondary | ICD-10-CM | POA: Diagnosis not present

## 2021-08-18 ENCOUNTER — Other Ambulatory Visit: Payer: Self-pay | Admitting: Psychiatry

## 2021-08-18 ENCOUNTER — Other Ambulatory Visit: Payer: Self-pay

## 2021-08-18 ENCOUNTER — Telehealth (INDEPENDENT_AMBULATORY_CARE_PROVIDER_SITE_OTHER): Payer: Medicare Other | Admitting: Psychiatry

## 2021-08-18 ENCOUNTER — Encounter: Payer: Self-pay | Admitting: Psychiatry

## 2021-08-18 DIAGNOSIS — G4700 Insomnia, unspecified: Secondary | ICD-10-CM | POA: Diagnosis not present

## 2021-08-18 DIAGNOSIS — F331 Major depressive disorder, recurrent, moderate: Secondary | ICD-10-CM

## 2021-08-18 DIAGNOSIS — F330 Major depressive disorder, recurrent, mild: Secondary | ICD-10-CM

## 2021-08-18 MED ORDER — ARIPIPRAZOLE 2 MG PO TABS: 2.0000 mg | ORAL_TABLET | Freq: Every day | ORAL | 0 refills | Status: DC

## 2021-08-18 MED ORDER — ZOLPIDEM TARTRATE 5 MG PO TABS: 5.0000 mg | ORAL_TABLET | Freq: Every evening | ORAL | 1 refills | Status: DC | PRN

## 2021-08-18 NOTE — Patient Instructions (Signed)
Continue sertraline 150 mg daily  Continue bupropion 150 mg at night  Discontinue doxepin Start Ambien 5 mg at night as needed for insomnia Continue Abilify 2 mg at night  Next appointment: 3/28 at 1 PM

## 2021-08-27 ENCOUNTER — Other Ambulatory Visit: Payer: Self-pay | Admitting: Family Medicine

## 2021-09-15 DIAGNOSIS — M47817 Spondylosis without myelopathy or radiculopathy, lumbosacral region: Secondary | ICD-10-CM | POA: Diagnosis not present

## 2021-09-15 DIAGNOSIS — E1142 Type 2 diabetes mellitus with diabetic polyneuropathy: Secondary | ICD-10-CM | POA: Diagnosis not present

## 2021-09-15 DIAGNOSIS — G894 Chronic pain syndrome: Secondary | ICD-10-CM | POA: Diagnosis not present

## 2021-09-15 DIAGNOSIS — K5900 Constipation, unspecified: Secondary | ICD-10-CM | POA: Diagnosis not present

## 2021-09-24 ENCOUNTER — Encounter: Payer: Self-pay | Admitting: Family Medicine

## 2021-09-24 ENCOUNTER — Ambulatory Visit (INDEPENDENT_AMBULATORY_CARE_PROVIDER_SITE_OTHER): Payer: Medicare Other | Admitting: Family Medicine

## 2021-09-24 VITALS — BP 178/109 | HR 110 | Temp 99.4°F | Ht 74.0 in | Wt 240.8 lb

## 2021-09-24 DIAGNOSIS — Z794 Long term (current) use of insulin: Secondary | ICD-10-CM

## 2021-09-24 DIAGNOSIS — E039 Hypothyroidism, unspecified: Secondary | ICD-10-CM | POA: Diagnosis not present

## 2021-09-24 DIAGNOSIS — J439 Emphysema, unspecified: Secondary | ICD-10-CM | POA: Diagnosis not present

## 2021-09-24 DIAGNOSIS — E1159 Type 2 diabetes mellitus with other circulatory complications: Secondary | ICD-10-CM

## 2021-09-24 DIAGNOSIS — I152 Hypertension secondary to endocrine disorders: Secondary | ICD-10-CM

## 2021-09-24 DIAGNOSIS — N1831 Chronic kidney disease, stage 3a: Secondary | ICD-10-CM

## 2021-09-24 DIAGNOSIS — E0822 Diabetes mellitus due to underlying condition with diabetic chronic kidney disease: Secondary | ICD-10-CM

## 2021-09-24 DIAGNOSIS — K219 Gastro-esophageal reflux disease without esophagitis: Secondary | ICD-10-CM

## 2021-09-24 DIAGNOSIS — E785 Hyperlipidemia, unspecified: Secondary | ICD-10-CM

## 2021-09-24 DIAGNOSIS — E1169 Type 2 diabetes mellitus with other specified complication: Secondary | ICD-10-CM | POA: Diagnosis not present

## 2021-09-24 LAB — BAYER DCA HB A1C WAIVED: HB A1C (BAYER DCA - WAIVED): 6.8 % — ABNORMAL HIGH (ref 4.8–5.6)

## 2021-09-24 MED ORDER — PANTOPRAZOLE SODIUM 20 MG PO TBEC: 20.0000 mg | DELAYED_RELEASE_TABLET | Freq: Every day | ORAL | 3 refills | Status: DC

## 2021-09-24 MED ORDER — AMLODIPINE BESYLATE 2.5 MG PO TABS: 2.5000 mg | ORAL_TABLET | Freq: Every day | ORAL | 3 refills | Status: DC

## 2021-09-24 MED ORDER — LEVOTHYROXINE SODIUM 175 MCG PO TABS: 175.0000 ug | ORAL_TABLET | Freq: Every day | ORAL | 3 refills | Status: DC

## 2021-09-24 MED ORDER — SPIRIVA HANDIHALER 18 MCG IN CAPS: 18.0000 ug | ORAL_CAPSULE | Freq: Every day | RESPIRATORY_TRACT | 3 refills | Status: DC

## 2021-09-24 MED ORDER — UBRELVY 100 MG PO TABS: 1.0000 | ORAL_TABLET | Freq: Every day | ORAL | 0 refills | Status: DC | PRN

## 2021-09-24 MED ORDER — ALBUTEROL SULFATE HFA 108 (90 BASE) MCG/ACT IN AERS: INHALATION_SPRAY | RESPIRATORY_TRACT | 3 refills | Status: DC

## 2021-09-24 NOTE — Progress Notes (Signed)
BP (!) 178/109    Pulse (!) 110    Temp 99.4 F (37.4 C) (Temporal)    Ht 6' 2"  (1.88 m)    Wt 240 lb 12.8 oz (109.2 kg)    BMI 30.92 kg/m    Subjective:   Patient ID: Joseph [name], male    DOB: 1961-04-11, 61 y.o.   MRN: [id number]  HPI: Joseph [name] is a 61 y.o. male presenting on 09/24/2021 for Follow-up and Hypothyroidism (Blood work well health check 3 mo)   HPI Hypothyroidism recheck Patient is coming in for thyroid recheck today as well. They deny any issues with hair changes or heat or cold problems or diarrhea or constipation. They deny any chest pain or palpitations. They are currently on levothyroxine 175 micrograms   Type 2 diabetes mellitus Patient comes in today for recheck of his diabetes. Patient has been currently taking diet controlled. Patient is not currently on an ACE inhibitor/ARB. Patient has not seen an ophthalmologist this year. Patient denies any issues with their feet. The symptom started onset as an adult hyperlipidemia ARE RELATED TO DM   Hyperlipidemia Patient is coming in for recheck of his hyperlipidemia. The patient is currently taking fenofibrate and Crestor. They deny any issues with myalgias or history of liver damage from it. They deny any focal numbness or weakness or chest pain.   Hypertension Patient is currently on no medicine, he says he could not tolerate lisinopril, and their blood pressure today is 169/99. Patient denies any lightheadedness or dizziness. Patient denies headaches, blurred vision, chest pains, shortness of breath, or weakness. Denies any side effects from medication and is content with current medication.   Anxiety recheck Patient is coming in today for anxiety recheck.  He currently sees psychiatry for this and says things are doing well with them.  Patient has COPD/emphysema and takes no medication, has been out of albuterol and Spiriva and says that he has been having more cough and congestion especially with the  pollen coming up and down in his chest at times.  He does have some occasional wheezing as well.  He does not have either inhaler.  He is also not taking any allergy medicines.  Relevant past medical, surgical, family and social history reviewed and updated as indicated. Interim medical history since our last visit reviewed. Allergies and medications reviewed and updated.  Review of Systems  Constitutional:  Negative for chills and fever.  HENT:  Positive for congestion.   Eyes:  Negative for visual disturbance.  Respiratory:  Positive for cough and wheezing. Negative for shortness of breath.   Cardiovascular:  Negative for chest pain and leg swelling.  Musculoskeletal:  Negative for back pain and gait problem.  Skin:  Negative for rash.  Neurological:  Negative for dizziness, weakness and light-headedness.  All other systems reviewed and are negative.  Per HPI unless specifically indicated above   Allergies as of 09/24/2021   No Known Allergies      Medication List        Accurate as of September 24, 2021  3:47 PM. If you have any questions, ask your nurse or doctor.          STOP taking these medications    rizatriptan 5 MG tablet Commonly known as: MAXALT       TAKE these medications    Accu-Chek Aviva Plus test strip Generic drug: glucose blood TEST BLOOD DAILY AND AS NEEDED DX E11.22   albuterol 108 (90  Base) MCG/ACT inhaler Commonly known as: VENTOLIN HFA TAKE 2 PUFFS BY MOUTH EVERY 6 HOURS AS NEEDED FOR WHEEZE OR SHORTNESS OF BREATH   Amitiza 24 MCG capsule Generic drug: lubiprostone Take 24 mcg by mouth 2 (two) times daily.   amLODipine 2.5 MG tablet Commonly known as: NORVASC Take 1 tablet (2.5 mg total) by mouth daily.   ARIPiprazole 2 MG tablet Commonly known as: ABILIFY Take 1 tablet (2 mg total) by mouth daily.   baclofen 10 MG tablet Commonly known as: LIORESAL Take 10 mg by mouth 3 (three) times daily.   buPROPion 150 MG 24 hr  tablet Commonly known as: WELLBUTRIN XL Take 1 tablet (150 mg total) by mouth daily.   fenofibrate 160 MG tablet Take 1 tablet (160 mg total) by mouth daily.   levothyroxine 175 MCG tablet Commonly known as: SYNTHROID Take 1 tablet (175 mcg total) by mouth daily.   lisinopril 20 MG tablet Commonly known as: ZESTRIL Take 1 tablet (20 mg total) by mouth daily.   methocarbamol 500 MG tablet Commonly known as: ROBAXIN Take 500 mg by mouth 3 (three) times daily as needed.   morphine 30 MG 12 hr tablet Commonly known as: MS CONTIN Take 30 mg by mouth every 8 (eight) hours.   oxyCODONE-acetaminophen 7.5-325 MG tablet Commonly known as: PERCOCET TAKE 1 TABLET BY MOUTH EVERY FOUR HOURS AS NEEDED FOR PAIN   pantoprazole 20 MG tablet Commonly known as: PROTONIX Take 1 tablet (20 mg total) by mouth daily.   pregabalin 75 MG capsule Commonly known as: LYRICA Take 75 mg by mouth 3 (three) times daily.   rosuvastatin 10 MG tablet Commonly known as: CRESTOR Take 1 tablet (10 mg total) by mouth daily.   sertraline 100 MG tablet Commonly known as: ZOLOFT Take 1.5 tablets (150 mg total) by mouth daily.   Spiriva HandiHaler 18 MCG inhalation capsule Generic drug: tiotropium Place 1 capsule (18 mcg total) into inhaler and inhale daily.   Ubrelvy 100 MG Tabs Generic drug: Ubrogepant Take 1 tablet by mouth daily as needed.   Vitamin D3 125 MCG (5000 UT) Caps Take 5,000 Units by mouth.   zolpidem 5 MG tablet Commonly known as: AMBIEN Take 1 tablet (5 mg total) by mouth at bedtime as needed for sleep.         Objective:   BP (!) 178/109    Pulse (!) 110    Temp 99.4 F (37.4 C) (Temporal)    Ht 6' 2"  (1.88 m)    Wt 240 lb 12.8 oz (109.2 kg)    BMI 30.92 kg/m   Wt Readings from Last 3 Encounters:  09/24/21 240 lb 12.8 oz (109.2 kg)  06/26/21 236 lb (107 kg)  03/26/21 222 lb (100.7 kg)    Physical Exam Vitals and nursing note reviewed.  Constitutional:      General:  He is not in acute distress.    Appearance: He is well-developed. He is not diaphoretic.     Comments: Tracheostomy  Eyes:     General: No scleral icterus.    Conjunctiva/sclera: Conjunctivae normal.  Neck:     Thyroid: No thyromegaly.  Cardiovascular:     Rate and Rhythm: Normal rate and regular rhythm.     Heart sounds: Normal heart sounds. No murmur heard. Pulmonary:     Effort: Pulmonary effort is normal. No respiratory distress.     Breath sounds: Normal breath sounds. No wheezing, rhonchi or rales.  Chest:  Chest wall: No tenderness.  Musculoskeletal:        General: No swelling. Normal range of motion.     Cervical back: Neck supple.  Lymphadenopathy:     Cervical: No cervical adenopathy.  Skin:    General: Skin is warm and dry.     Findings: No rash.  Neurological:     Mental Status: He is alert and oriented to person, place, and time.     Coordination: Coordination normal.  Psychiatric:        Behavior: Behavior normal.      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[place])   Relevant Orders   Bayer DCA Hb A1c Waived   Hyperlipidemia associated with type 2 diabetes mellitus (HCC)   Relevant Medications   amLODipine (NORVASC) 2.5 MG tablet   Other Relevant Orders   CMP14+EGFR   Lipid panel   Other Visit Diagnoses     Gastroesophageal reflux disease       Relevant Medications   pantoprazole (PROTONIX) 20 MG tablet   Other Relevant Orders   CBC with Differential/Platelet   Hypertension associated with diabetes (HCC)       Relevant Medications   amLODipine (NORVASC) 2.5 MG tablet   Pulmonary emphysema, unspecified emphysema type (HCC)       Relevant Medications   tiotropium (SPIRIVA HANDIHALER) 18 MCG inhalation capsule   albuterol (VENTOLIN  HFA) 108 (90 Base) MCG/ACT inhaler       Gave sample of Ubrelvy to try instead of the Maxalt and see if it helps.  He said Maxalt is not helping his headaches/migraines.  We will check blood work, continue current medicine.   We will try amlodipine 2.5 mg to see if we can help with his blood pressure Follow up plan: Return in about 3 months (around 12/25/2021), or if symptoms worsen or fail to improve, for  Diabetes and thyroid.  Counseling provided for all of the vaccine components Orders Placed This Encounter  Procedures   CMP14+EGFR   CBC with Differential/Platelet   Lipid panel   TSH   Bayer DCA Hb A1c [place], MD [facility] 09/24/2021, 3:47 PM

## 2021-09-25 LAB — CMP14+EGFR
ALT: 33 IU/L (ref 0–44)
AST: 25 IU/L (ref 0–40)
Albumin/Globulin Ratio: 1.7 (ref 1.2–2.2)
Albumin: 4.6 g/dL (ref 3.8–4.8)
Alkaline Phosphatase: 40 IU/L — ABNORMAL LOW (ref 44–121)
BUN/Creatinine Ratio: 13 (ref 10–24)
BUN: 16 mg/dL (ref 8–27)
Bilirubin Total: 0.2 mg/dL (ref 0.0–1.2)
CO2: 25 mmol/L (ref 20–29)
Calcium: 10.2 mg/dL (ref 8.6–10.2)
Chloride: 98 mmol/L (ref 96–106)
Creatinine, Ser: 1.28 mg/dL — ABNORMAL HIGH (ref 0.76–1.27)
Globulin, Total: 2.7 g/dL (ref 1.5–4.5)
Glucose: 172 mg/dL — ABNORMAL HIGH (ref 70–99)
Potassium: 4.8 mmol/L (ref 3.5–5.2)
Sodium: 137 mmol/L (ref 134–144)
Total Protein: 7.3 g/dL (ref 6.0–8.5)
eGFR: 64 mL/min/{1.73_m2} (ref >59)

## 2021-09-25 LAB — LIPID PANEL
Chol/HDL Ratio: 4.4 ratio (ref 0.0–5.0)
Cholesterol, Total: 181 mg/dL (ref 100–199)
HDL: 41 mg/dL (ref >39)
LDL Chol Calc (NIH): 87 mg/dL (ref 0–99)
Triglycerides: 325 mg/dL — ABNORMAL HIGH (ref 0–149)
VLDL Cholesterol Cal: 53 mg/dL — ABNORMAL HIGH (ref 5–40)

## 2021-09-25 LAB — CBC WITH DIFFERENTIAL/PLATELET
Basophils Absolute: 0.1 10*3/uL (ref 0.0–0.2)
Basos: 2 %
EOS (ABSOLUTE): 0.2 10*3/uL (ref 0.0–0.4)
Eos: 3 %
Hematocrit: 43.7 % (ref 37.5–51.0)
Hemoglobin: 14.4 g/dL (ref 13.0–17.7)
Immature Grans (Abs): 0 10*3/uL (ref 0.0–0.1)
Immature Granulocytes: 0 %
Lymphocytes Absolute: 2 10*3/uL (ref 0.7–3.1)
Lymphs: 35 %
MCH: 28.1 pg (ref 26.6–33.0)
MCHC: 33 g/dL (ref 31.5–35.7)
MCV: 85 fL (ref 79–97)
Monocytes Absolute: 0.5 10*3/uL (ref 0.1–0.9)
Monocytes: 9 %
Neutrophils Absolute: 2.8 10*3/uL (ref 1.4–7.0)
Neutrophils: 51 %
Platelets: 238 10*3/uL (ref 150–450)
RBC: 5.12 x10E6/uL (ref 4.14–5.80)
RDW: 11.9 % (ref 11.6–15.4)
WBC: 5.6 10*3/uL (ref 3.4–10.8)

## 2021-09-25 LAB — TSH: TSH: 0.78 u[IU]/mL (ref 0.450–4.500)

## 2021-09-30 ENCOUNTER — Other Ambulatory Visit (HOSPITAL_COMMUNITY): Payer: Self-pay | Admitting: Physical Medicine and Rehabilitation

## 2021-09-30 ENCOUNTER — Other Ambulatory Visit: Payer: Self-pay | Admitting: Physical Medicine and Rehabilitation

## 2021-09-30 DIAGNOSIS — M5416 Radiculopathy, lumbar region: Secondary | ICD-10-CM

## 2021-10-06 ENCOUNTER — Telehealth: Payer: Self-pay

## 2021-10-06 DIAGNOSIS — J988 Other specified respiratory disorders: Secondary | ICD-10-CM

## 2021-10-06 NOTE — Telephone Encounter (Signed)
Albuterol or Ventolin HFA 108 inhaler is not a covered medication under pts MCR plan ? ?Alternatives include ? ?Albuterol HFA (Proair)-tier 2 or Ventolin HFA ( tier 3) ? ?Please review if pt can be switched to one of the above inhalers. ? ?If able, please send to Rx to [facility] in [place]. ?

## 2021-10-07 MED ORDER — PROAIR HFA 108 (90 BASE) MCG/ACT IN AERS: 1.0000 | INHALATION_SPRAY | Freq: Four times a day (QID) | RESPIRATORY_TRACT | 3 refills | Status: DC | PRN

## 2021-10-07 NOTE — Telephone Encounter (Signed)
Left message for pt to return call. Need to inform of med change. ?

## 2021-10-07 NOTE — Telephone Encounter (Signed)
Sent proair

## 2021-10-07 NOTE — Addendum Note (Signed)
Addended by: Caryl Pina on: 10/07/2021 08:41 AM ? ? Modules accepted: Orders ? ?

## 2021-10-08 NOTE — Progress Notes (Signed)
Virtual Visit via Telephone Note ? ?I connected with Joseph [name] on 10/13/21 at  1:00 PM EDT by telephone and verified that I am speaking with the correct person using two identifiers. ? ?Location: ?Patient: home ?Provider: office ?Persons participated in the visit- patient, provider  ?  ?I discussed the limitations, risks, security and privacy concerns of performing an evaluation and management service by telephone and the availability of in person appointments. I also discussed with the patient that there may be a patient responsible charge related to this service. The patient expressed understanding and agreed to proceed. ? ? ?  ?I discussed the assessment and treatment plan with the patient. The patient was provided an opportunity to ask questions and all were answered. The patient agreed with the plan and demonstrated an understanding of the instructions. ?  ?The patient was advised to call back or seek an in-person evaluation if the symptoms worsen or if the condition fails to improve as anticipated. ? ?I provided 13 minutes of non-face-to-face time during this encounter. ? ? ?[name], MD ? ? ? ? ? ?BH MD/PA/NP OP Progress Note ? ?10/13/2021 1:22 PM ?Joseph [name]  ?MRN:  [id number] ? ?Chief Complaint:  ?Chief Complaint  ?Patient presents with  ? Follow-up  ? Depression  ? ?HPI:  ?This is a follow-up appointment for depression and anxiety.  ?He states that he is not doing good as his allergy is bad.  He has rhinorrhea, and shortness of breath.  He has not taken claritin, which has been prescribed by his PCP.  He feels down due to his physical symptoms.  He enjoys going outside when he is physically good.  He tends to stay inside the house otherwise. He enjoys taking care of his dog and cat.  He reports fair relationship with his wife.  He sleeps better with Ambien, although he has middle insomnia due to his physical condition of shortness of breath at times.  He denies change in appetite.  He  denies SI.  He feels less anxious.  He has occasional panic attacks.  He feels comfortable to stay on the current medication regimen at this time.  ? ?Daily routine: stays in the house most of the time ?Employment: on disability for laryngeal cancer, he used to work for [employer] for 15 years ?Household: wife, dog, cat ?Marital status: married since 67s. ?Number of children: 0 / 2 step children ? ?Visit Diagnosis:  ?  ICD-10-CM   ?1. MDD (major depressive disorder), recurrent episode, mild ([place])  F33.0   ?  ?2. Insomnia, unspecified type  G47.00   ?  ? ? ?Past Psychiatric History: Please see initial evaluation for full details. I have reviewed the history. No updates at this time.  ?  ? ?Past Medical History:  ?Past Medical History:  ?Diagnosis Date  ? Anxiety   ? COPD (chronic obstructive pulmonary disease) ([place])   ? AB clinical dx; HFA 75% 02/28/10 > 90% Sept 21, 2011  ? Depression   ? Diabetes mellitus   ? Hyperlipidemia   ? Weight gain   ? After quitting smoking in 2005  ?  ?Past Surgical History:  ?Procedure Laterality Date  ? KNEE ARTHROSCOPY    ? right  ? 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 For T3 N0 epiglottic cancer  ? ? ?Family Psychiatric History: Please see initial evaluation for full details. I have reviewed the history. No updates at this time.  ?  ? ?Family History:  ?  Family History  ?Problem Relation Age of Onset  ? Emphysema Sister   ? Prostate cancer Maternal Grandfather   ? Clotting disorder Maternal Grandfather   ? Cancer Maternal Grandmother   ?     brain  ? Depression Maternal Uncle   ? Atopy Neg Hx   ? ? ?Social History:  ?Social History  ? ?Socioeconomic History  ? Marital status: Married  ?  Spouse name: [name]  ? Number of children: 2  ? Years of education: 22  ? Highest education level: High school graduate  ?Occupational History  ? Occupation: disability  ?Tobacco Use  ? Smoking status: Former  ?  Packs/day: 3.00  ?  Years: 30.00  ?  Pack years: 90.00  ?  Types: Cigarettes  ?   Quit date: 07/20/2003  ?  Years since quitting: 18.2  ? Smokeless tobacco: Never  ?Vaping Use  ? Vaping Use: Never used  ?Substance and Sexual Activity  ? Alcohol use: Not Currently  ?  Comment: per week  ? Drug use: No  ? Sexual activity: Yes  ?Other Topics Concern  ? Not on file  ?Social History Narrative  ? Married with 2 children  ? ?Social Determinants of Health  ? ?Financial Resource Strain: High Risk  ? Difficulty of Paying Living Expenses: Hard  ?Food Insecurity: Food Insecurity Present  ? Worried About Charity fundraiser in the Last Year: Sometimes true  ? Ran Out of Food in the Last Year: Never true  ?Transportation Needs: No Transportation Needs  ? Lack of Transportation (Medical): No  ? Lack of Transportation (Non-Medical): No  ?Physical Activity: Insufficiently Active  ? Days of Exercise per Week: 3 days  ? Minutes of Exercise per Session: 20 min  ?Stress: Stress Concern Present  ? Feeling of Stress : To some extent  ?Social Connections: Unknown  ? Frequency of Communication with Friends and Family: Once a week  ? Frequency of Social Gatherings with Friends and Family: Once a week  ? Attends Religious Services: Never  ? Active Member of Clubs or Organizations: No  ? Attends Archivist Meetings: Never  ? Marital Status: Not on file  ? ? ?Allergies: No Known Allergies ? ?Metabolic Disorder Labs: ?Lab Results  ?Component Value Date  ? HGBA1C 6.8 (H) 09/24/2021  ? MPG 137 (H) 10/30/2011  ? MPG 361 10/17/2009  ? ?No results found for: PROLACTIN ?Lab Results  ?Component Value Date  ? CHOL 181 09/24/2021  ? TRIG 325 (H) 09/24/2021  ? HDL 41 09/24/2021  ? CHOLHDL 4.4 09/24/2021  ? VLDL UNABLE TO CALCULATE IF TRIGLYCERIDE OVER 400 mg/dL 10/17/2009  ? [place] 87 09/24/2021  ? LDLCALC 103 (H) 03/26/2021  ? ?Lab Results  ?Component Value Date  ? TSH 0.780 09/24/2021  ? TSH 0.008 (L) 03/26/2021  ? ? ?Therapeutic Level Labs: ?No results found for: LITHIUM ?No results found for: VALPROATE ?No components found  for:  CBMZ ? ?Current Medications: ?Current Outpatient Medications  ?Medication Sig Dispense Refill  ? ACCU-CHEK AVIVA PLUS test strip TEST BLOOD DAILY AND AS NEEDED DX E11.22 100 strip 3  ? AMITIZA 24 MCG capsule Take 24 mcg by mouth 2 (two) times daily.    ? amLODipine (NORVASC) 2.5 MG tablet Take 1 tablet (2.5 mg total) by mouth daily. 90 tablet 3  ? [START ON 11/25/2021] ARIPiprazole (ABILIFY) 2 MG tablet Take 1 tablet (2 mg total) by mouth daily. 90 tablet 0  ? baclofen (LIORESAL) 10 MG  tablet Take 10 mg by mouth 3 (three) times daily.    ? buPROPion (WELLBUTRIN XL) 150 MG 24 hr tablet Take 1 tablet (150 mg total) by mouth daily. 90 tablet 1  ? Cholecalciferol (VITAMIN D3) 5000 units CAPS Take 5,000 Units by mouth.    ? fenofibrate 160 MG tablet Take 1 tablet (160 mg total) by mouth daily. 90 tablet 3  ? levothyroxine (SYNTHROID) 175 MCG tablet Take 1 tablet (175 mcg total) by mouth daily. 90 tablet 3  ? lisinopril (ZESTRIL) 20 MG tablet Take 1 tablet (20 mg total) by mouth daily. 90 tablet 3  ? methocarbamol (ROBAXIN) 500 MG tablet Take 500 mg by mouth 3 (three) times daily as needed.    ? morphine (MS CONTIN) 30 MG 12 hr tablet Take 30 mg by mouth every 8 (eight) hours.    ? oxyCODONE-acetaminophen (PERCOCET) 7.5-325 MG tablet TAKE 1 TABLET BY MOUTH EVERY FOUR HOURS AS NEEDED FOR PAIN    ? pantoprazole (PROTONIX) 20 MG tablet Take 1 tablet (20 mg total) by mouth daily. 90 tablet 3  ? pregabalin (LYRICA) 75 MG capsule Take 75 mg by mouth 3 (three) times daily.    ? PROAIR HFA 108 (90 Base) MCG/ACT inhaler Inhale 1-2 puffs into the lungs every 6 (six) hours as needed for wheezing or shortness of breath. 18 g 3  ? rosuvastatin (CRESTOR) 10 MG tablet Take 1 tablet (10 mg total) by mouth daily. 90 tablet 3  ? sertraline (ZOLOFT) 100 MG tablet Take 1.5 tablets (150 mg total) by mouth daily. 135 tablet 1  ? tiotropium (SPIRIVA HANDIHALER) 18 MCG inhalation capsule Place 1 capsule (18 mcg total) into inhaler and  inhale daily. 90 capsule 3  ? Ubrogepant (UBRELVY) 100 MG TABS Take 1 tablet by mouth daily as needed. 10 tablet 0  ? zolpidem (AMBIEN) 5 MG tablet Take 1 tablet (5 mg total) by mouth at bedtime as needed for

## 2021-10-13 ENCOUNTER — Encounter: Payer: Self-pay | Admitting: Psychiatry

## 2021-10-13 ENCOUNTER — Telehealth (INDEPENDENT_AMBULATORY_CARE_PROVIDER_SITE_OTHER): Payer: Medicare Other | Admitting: Psychiatry

## 2021-10-13 ENCOUNTER — Other Ambulatory Visit: Payer: Self-pay | Admitting: Family Medicine

## 2021-10-13 ENCOUNTER — Other Ambulatory Visit: Payer: Self-pay

## 2021-10-13 DIAGNOSIS — G894 Chronic pain syndrome: Secondary | ICD-10-CM | POA: Diagnosis not present

## 2021-10-13 DIAGNOSIS — G4700 Insomnia, unspecified: Secondary | ICD-10-CM

## 2021-10-13 DIAGNOSIS — F330 Major depressive disorder, recurrent, mild: Secondary | ICD-10-CM

## 2021-10-13 DIAGNOSIS — M47817 Spondylosis without myelopathy or radiculopathy, lumbosacral region: Secondary | ICD-10-CM | POA: Diagnosis not present

## 2021-10-13 DIAGNOSIS — K5900 Constipation, unspecified: Secondary | ICD-10-CM | POA: Diagnosis not present

## 2021-10-13 DIAGNOSIS — E1142 Type 2 diabetes mellitus with diabetic polyneuropathy: Secondary | ICD-10-CM | POA: Diagnosis not present

## 2021-10-13 DIAGNOSIS — J988 Other specified respiratory disorders: Secondary | ICD-10-CM

## 2021-10-13 MED ORDER — ARIPIPRAZOLE 2 MG PO TABS: 2.0000 mg | ORAL_TABLET | Freq: Every day | ORAL | 0 refills | Status: DC

## 2021-10-13 MED ORDER — ZOLPIDEM TARTRATE 5 MG PO TABS: 5.0000 mg | ORAL_TABLET | Freq: Every evening | ORAL | 2 refills | Status: DC | PRN

## 2021-10-13 NOTE — Patient Instructions (Signed)
Continue sertraline 150 mg daily  ?Continue bupropion 150 mg at night  ?Continue Abilify 2 mg at night  ?Continue Ambien 5 mg at night as needed for insomnia ?Next appointment: 6/19 at 2 PM ? ?The next visit will be in person visit. Please arrive 15 mins before the scheduled time.  ? ?[facility]  ?Address: [address], [place], [place] [address]   ?

## 2021-10-16 NOTE — Telephone Encounter (Signed)
Attempts to contact pt without a return call in over 3 days, will close encounter. °

## 2021-10-19 ENCOUNTER — Ambulatory Visit (HOSPITAL_COMMUNITY)
Admission: RE | Admit: 2021-10-19 | Discharge: 2021-10-19 | Disposition: A | Discharge status: Home / Self Care | Payer: Medicare Other | Source: Ambulatory Visit | Attending: Physical Medicine and Rehabilitation | Admitting: Physical Medicine and Rehabilitation

## 2021-10-19 DIAGNOSIS — M5416 Radiculopathy, lumbar region: Secondary | ICD-10-CM | POA: Diagnosis not present

## 2021-11-10 DIAGNOSIS — L821 Other seborrheic keratosis: Secondary | ICD-10-CM | POA: Diagnosis not present

## 2021-11-10 DIAGNOSIS — L820 Inflamed seborrheic keratosis: Secondary | ICD-10-CM | POA: Diagnosis not present

## 2021-11-12 ENCOUNTER — Other Ambulatory Visit: Payer: Self-pay | Admitting: Psychiatry

## 2021-11-12 DIAGNOSIS — F330 Major depressive disorder, recurrent, mild: Secondary | ICD-10-CM

## 2021-11-16 DIAGNOSIS — E1142 Type 2 diabetes mellitus with diabetic polyneuropathy: Secondary | ICD-10-CM | POA: Diagnosis not present

## 2021-11-16 DIAGNOSIS — G894 Chronic pain syndrome: Secondary | ICD-10-CM | POA: Diagnosis not present

## 2021-11-16 DIAGNOSIS — K5900 Constipation, unspecified: Secondary | ICD-10-CM | POA: Diagnosis not present

## 2021-11-16 DIAGNOSIS — M47817 Spondylosis without myelopathy or radiculopathy, lumbosacral region: Secondary | ICD-10-CM | POA: Diagnosis not present

## 2021-12-16 ENCOUNTER — Other Ambulatory Visit: Payer: Self-pay | Admitting: Psychiatry

## 2021-12-16 DIAGNOSIS — F330 Major depressive disorder, recurrent, mild: Secondary | ICD-10-CM

## 2021-12-17 DIAGNOSIS — E1142 Type 2 diabetes mellitus with diabetic polyneuropathy: Secondary | ICD-10-CM | POA: Diagnosis not present

## 2021-12-17 DIAGNOSIS — M47817 Spondylosis without myelopathy or radiculopathy, lumbosacral region: Secondary | ICD-10-CM | POA: Diagnosis not present

## 2021-12-17 DIAGNOSIS — G894 Chronic pain syndrome: Secondary | ICD-10-CM | POA: Diagnosis not present

## 2021-12-17 DIAGNOSIS — K5900 Constipation, unspecified: Secondary | ICD-10-CM | POA: Diagnosis not present

## 2021-12-25 ENCOUNTER — Ambulatory Visit (INDEPENDENT_AMBULATORY_CARE_PROVIDER_SITE_OTHER): Payer: Medicare Other | Admitting: Family Medicine

## 2021-12-25 ENCOUNTER — Encounter: Payer: Self-pay | Admitting: Family Medicine

## 2021-12-25 VITALS — BP 143/87 | HR 107 | Temp 98.3°F | Wt 233.0 lb

## 2021-12-25 DIAGNOSIS — E785 Hyperlipidemia, unspecified: Secondary | ICD-10-CM | POA: Diagnosis not present

## 2021-12-25 DIAGNOSIS — E039 Hypothyroidism, unspecified: Secondary | ICD-10-CM

## 2021-12-25 DIAGNOSIS — E1159 Type 2 diabetes mellitus with other circulatory complications: Secondary | ICD-10-CM | POA: Diagnosis not present

## 2021-12-25 DIAGNOSIS — E1169 Type 2 diabetes mellitus with other specified complication: Secondary | ICD-10-CM

## 2021-12-25 DIAGNOSIS — I152 Hypertension secondary to endocrine disorders: Secondary | ICD-10-CM

## 2021-12-25 DIAGNOSIS — Z794 Long term (current) use of insulin: Secondary | ICD-10-CM

## 2021-12-25 DIAGNOSIS — Z23 Encounter for immunization: Secondary | ICD-10-CM

## 2021-12-25 DIAGNOSIS — N1831 Chronic kidney disease, stage 3a: Secondary | ICD-10-CM

## 2021-12-25 DIAGNOSIS — F331 Major depressive disorder, recurrent, moderate: Secondary | ICD-10-CM

## 2021-12-25 DIAGNOSIS — E0822 Diabetes mellitus due to underlying condition with diabetic chronic kidney disease: Secondary | ICD-10-CM

## 2021-12-25 LAB — BAYER DCA HB A1C WAIVED: HB A1C (BAYER DCA - WAIVED): 6.6 % — ABNORMAL HIGH (ref 4.8–5.6)

## 2021-12-25 MED ORDER — AMLODIPINE BESYLATE 5 MG PO TABS: 5.0000 mg | ORAL_TABLET | Freq: Every day | ORAL | 3 refills | Status: DC

## 2021-12-25 NOTE — Progress Notes (Signed)
BP (!) 143/87   Pulse (!) 107   Temp 98.3 F (36.8 C)   Wt 233 lb (105.7 kg)   SpO2 95%   BMI 29.92 kg/m    Subjective:   Patient ID: Joseph [name], male    DOB: 1960-11-21, 61 y.o.   MRN: [id number]  HPI: Joseph [name] is a 61 y.o. male presenting on 12/25/2021 for Medical Management of Chronic Issues, Hypothyroidism, Hyperlipidemia, Hypertension, and Diabetes   HPI Hypothyroidism recheck Patient is coming in for thyroid recheck today as well. They deny any issues with hair changes or heat or cold problems or diarrhea or constipation. They deny any chest pain or palpitations. They are currently on levothyroxine 175 micrograms   Hypertension Patient is currently on amlodipine., and their blood pressure today is 169/92. Patient denies any lightheadedness or dizziness. Patient denies headaches, blurred vision, chest pains, shortness of breath, or weakness. Denies any side effects from medication and is content with current medication.   Type 2 diabetes mellitus Patient comes in today for recheck of his diabetes. Patient has been currently taking the no medicine currently, diet controlled A1c 6.6.. Patient is not currently on an ACE inhibitor/ARB. Patient has not seen an ophthalmologist this year. Patient denies any new issues with their feet except for fungus on one of his toenails. The symptom started onset as an adult hypertension and hyperlipidemia and hypothyroid ARE RELATED TO DM   Hyperlipidemia Patient is coming in for recheck of his hyperlipidemia. The patient is currently taking Crestor and fenofibrate. They deny any issues with myalgias or history of liver damage from it. They deny any focal numbness or weakness or chest pain.   Relevant past medical, surgical, family and social history reviewed and updated as indicated. Interim medical history since our last visit reviewed. Allergies and medications reviewed and updated.  Review of Systems  Constitutional:  Negative  for chills and fever.  Eyes:  Negative for visual disturbance.  Respiratory:  Negative for shortness of breath and wheezing.   Cardiovascular:  Negative for chest pain and leg swelling.  Musculoskeletal:  Negative for back pain and gait problem.  Skin:  Negative for rash.  Neurological:  Negative for dizziness and light-headedness.  All other systems reviewed and are negative.   Per HPI unless specifically indicated above   Allergies as of 12/25/2021   No Known Allergies      Medication List        Accurate as of December 25, 2021  4:38 PM. If you have any questions, ask your nurse or doctor.          STOP taking these medications    lisinopril 20 MG tablet Commonly known as: ZESTRIL Stopped by: Fransisca Kaufmann Bobette Leyh, MD       TAKE these medications    Accu-Chek Aviva Plus test strip Generic drug: glucose blood TEST BLOOD DAILY AND AS NEEDED DX E11.22   albuterol 108 (90 Base) MCG/ACT inhaler Commonly known as: ProAir HFA Inhale 1-2 puffs into the lungs every 6 (six) hours as needed for wheezing or shortness of breath.   Amitiza 24 MCG capsule Generic drug: lubiprostone Take 24 mcg by mouth 2 (two) times daily.   amLODipine 5 MG tablet Commonly known as: NORVASC Take 1 tablet (5 mg total) by mouth daily. What changed:  medication strength how much to take Changed by: Fransisca Kaufmann Kalaya Infantino, MD   ARIPiprazole 2 MG tablet Commonly known as: ABILIFY Take 1 tablet (2 mg total)  by mouth daily.   baclofen 10 MG tablet Commonly known as: LIORESAL Take 10 mg by mouth 3 (three) times daily.   buPROPion 150 MG 24 hr tablet Commonly known as: WELLBUTRIN XL Take 1 tablet (150 mg total) by mouth daily.   fenofibrate 160 MG tablet Take 1 tablet (160 mg total) by mouth daily.   levothyroxine 175 MCG tablet Commonly known as: SYNTHROID Take 1 tablet (175 mcg total) by mouth daily.   methocarbamol 500 MG tablet Commonly known as: ROBAXIN Take 500 mg by mouth 3 (three)  times daily as needed.   morphine 30 MG 12 hr tablet Commonly known as: MS CONTIN Take 30 mg by mouth every 8 (eight) hours.   oxyCODONE-acetaminophen 7.5-325 MG tablet Commonly known as: PERCOCET TAKE 1 TABLET BY MOUTH EVERY FOUR HOURS AS NEEDED FOR PAIN   pantoprazole 20 MG tablet Commonly known as: PROTONIX Take 1 tablet (20 mg total) by mouth daily.   pregabalin 75 MG capsule Commonly known as: LYRICA Take 75 mg by mouth 3 (three) times daily.   rosuvastatin 10 MG tablet Commonly known as: CRESTOR Take 1 tablet (10 mg total) by mouth daily.   sertraline 100 MG tablet Commonly known as: ZOLOFT Take 1.5 tablets (150 mg total) by mouth daily.   Spiriva HandiHaler 18 MCG inhalation capsule Generic drug: tiotropium Place 1 capsule (18 mcg total) into inhaler and inhale daily.   Ubrelvy 100 MG Tabs Generic drug: Ubrogepant Take 1 tablet by mouth daily as needed.   Vitamin D3 125 MCG (5000 UT) Caps Take 5,000 Units by mouth.   zolpidem 5 MG tablet Commonly known as: AMBIEN Take 1 tablet (5 mg total) by mouth at bedtime as needed for sleep.         Objective:   BP (!) 143/87   Pulse (!) 107   Temp 98.3 F (36.8 C)   Wt 233 lb (105.7 kg)   SpO2 95%   BMI 29.92 kg/m   Wt Readings from Last 3 Encounters:  12/25/21 233 lb (105.7 kg)  09/24/21 240 lb 12.8 oz (109.2 kg)  06/26/21 236 lb (107 kg)    Physical Exam Vitals and nursing note reviewed.  Constitutional:      General: He is not in acute distress.    Appearance: He is well-developed. He is not diaphoretic.  Eyes:     General: No scleral icterus.    Conjunctiva/sclera: Conjunctivae normal.  Cardiovascular:     Rate and Rhythm: Normal rate and regular rhythm.     Heart sounds: Normal heart sounds. No murmur heard. Pulmonary:     Effort: Pulmonary effort is normal. No respiratory distress.     Breath sounds: Normal breath sounds. No wheezing.  Musculoskeletal:        General: Normal range of  motion.     Cervical back: Neck supple.  Skin:    General: Skin is warm and dry.     Findings: No rash.  Neurological:     Mental Status: He is alert and oriented to person, place, and time.     Coordination: Coordination normal.  Psychiatric:        Behavior: Behavior normal.       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[place])   Relevant Orders   BMP8+EGFR   Bayer DCA Hb A1c Waived (Completed)   Hyperlipidemia  associated with type 2 diabetes mellitus ([place]) - Primary   Relevant Medications   amLODipine (NORVASC) 5 MG tablet   Other Relevant Orders   BMP8+EGFR   Bayer DCA Hb A1c Waived (Completed)     Other   MDD (major depressive disorder), recurrent episode, moderate ([place])   Other Visit Diagnoses     Hypertension associated with diabetes ([place])       Relevant Medications   amLODipine (NORVASC) 5 MG tablet   Other Relevant Orders   BMP8+EGFR   Bayer DCA Hb A1c Waived (Completed)   Need for Tdap vaccination       Relevant Orders   Tdap vaccine greater than or equal to 7yo IM (Completed)       A1c good at 6.6, continue with diet control. Blood pressure elevated today, will increase his amlodipine to 5 mg daily  Follow up plan: Return in about 3 months (around 03/27/2022), or if symptoms worsen or fail to improve, for Hypertension and diabetes.  Counseling provided for all of the vaccine components Orders Placed This Encounter  Procedures   Tdap vaccine greater than or equal to 7yo IM   BMP8+EGFR   Bayer DCA Hb A1c Waived    Caryl Pina, MD [facility] 12/25/2021, 4:38 PM

## 2021-12-26 LAB — BMP8+EGFR
BUN/Creatinine Ratio: 16 (ref 10–24)
BUN: 22 mg/dL (ref 8–27)
CO2: 23 mmol/L (ref 20–29)
Calcium: 9.5 mg/dL (ref 8.6–10.2)
Chloride: 102 mmol/L (ref 96–106)
Creatinine, Ser: 1.34 mg/dL — ABNORMAL HIGH (ref 0.76–1.27)
Glucose: 167 mg/dL — ABNORMAL HIGH (ref 70–99)
Potassium: 4.8 mmol/L (ref 3.5–5.2)
Sodium: 141 mmol/L (ref 134–144)
eGFR: 60 mL/min/{1.73_m2} (ref >59)
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
## 2022-01-04 ENCOUNTER — Encounter: Payer: Self-pay | Admitting: Psychiatry

## 2022-01-04 ENCOUNTER — Telehealth (INDEPENDENT_AMBULATORY_CARE_PROVIDER_SITE_OTHER): Payer: Medicare Other | Admitting: Psychiatry

## 2022-01-04 DIAGNOSIS — F330 Major depressive disorder, recurrent, mild: Secondary | ICD-10-CM

## 2022-01-04 DIAGNOSIS — G4700 Insomnia, unspecified: Secondary | ICD-10-CM | POA: Diagnosis not present

## 2022-01-04 MED ORDER — ARIPIPRAZOLE 2 MG PO TABS: 2.0000 mg | ORAL_TABLET | Freq: Every day | ORAL | 0 refills | Status: DC

## 2022-01-04 MED ORDER — ZOLPIDEM TARTRATE 5 MG PO TABS: 5.0000 mg | ORAL_TABLET | Freq: Every evening | ORAL | 1 refills | Status: DC | PRN

## 2022-01-13 DIAGNOSIS — G894 Chronic pain syndrome: Secondary | ICD-10-CM | POA: Diagnosis not present

## 2022-01-13 DIAGNOSIS — K5900 Constipation, unspecified: Secondary | ICD-10-CM | POA: Diagnosis not present

## 2022-01-13 DIAGNOSIS — M47817 Spondylosis without myelopathy or radiculopathy, lumbosacral region: Secondary | ICD-10-CM | POA: Diagnosis not present

## 2022-01-13 DIAGNOSIS — E1142 Type 2 diabetes mellitus with diabetic polyneuropathy: Secondary | ICD-10-CM | POA: Diagnosis not present

## 2022-02-10 ENCOUNTER — Other Ambulatory Visit: Payer: Self-pay | Admitting: Psychiatry

## 2022-02-10 ENCOUNTER — Other Ambulatory Visit: Payer: Self-pay | Admitting: Family Medicine

## 2022-02-10 DIAGNOSIS — E1169 Type 2 diabetes mellitus with other specified complication: Secondary | ICD-10-CM

## 2022-02-10 DIAGNOSIS — E1159 Type 2 diabetes mellitus with other circulatory complications: Secondary | ICD-10-CM

## 2022-02-10 DIAGNOSIS — E782 Mixed hyperlipidemia: Secondary | ICD-10-CM

## 2022-02-10 DIAGNOSIS — F330 Major depressive disorder, recurrent, mild: Secondary | ICD-10-CM

## 2022-02-11 DIAGNOSIS — M47817 Spondylosis without myelopathy or radiculopathy, lumbosacral region: Secondary | ICD-10-CM | POA: Diagnosis not present

## 2022-02-11 DIAGNOSIS — K5900 Constipation, unspecified: Secondary | ICD-10-CM | POA: Diagnosis not present

## 2022-02-11 DIAGNOSIS — E1142 Type 2 diabetes mellitus with diabetic polyneuropathy: Secondary | ICD-10-CM | POA: Diagnosis not present

## 2022-02-11 DIAGNOSIS — G894 Chronic pain syndrome: Secondary | ICD-10-CM | POA: Diagnosis not present

## 2022-02-12 ENCOUNTER — Telehealth: Payer: Self-pay

## 2022-02-12 NOTE — Telephone Encounter (Signed)
called left message that per the pharmacy that he should be able to pick up tomorrow.

## 2022-02-12 NOTE — Telephone Encounter (Signed)
called pharmacy they stated that patient can not get until tomorrow. and that they also have another rx on hold.

## 2022-02-12 NOTE — Telephone Encounter (Signed)
lorie called left message that husband needed refill on the zolpidem.

## 2022-02-12 NOTE — Telephone Encounter (Signed)
Noted, thanks!

## 2022-03-03 ENCOUNTER — Ambulatory Visit (INDEPENDENT_AMBULATORY_CARE_PROVIDER_SITE_OTHER): Payer: Medicare Other

## 2022-03-03 VITALS — Wt 230.0 lb

## 2022-03-03 DIAGNOSIS — L820 Inflamed seborrheic keratosis: Secondary | ICD-10-CM | POA: Insufficient documentation

## 2022-03-03 DIAGNOSIS — Z0000 Encounter for general adult medical examination without abnormal findings: Secondary | ICD-10-CM

## 2022-03-03 NOTE — Progress Notes (Signed)
Subjective:   Joseph [name] is a 61 y.o. male who presents for Medicare Annual/Subsequent preventive examination.  Virtual Visit via Telephone Note  I connected with  Joseph [name] on 03/03/22 at  3:00 PM EDT by telephone and verified that I am speaking with the correct person using two identifiers.  Location: Patient: Home Provider: [facility] Persons participating in the virtual visit: patient/Nurse Health Advisor   I discussed the limitations, risks, security and privacy concerns of performing an evaluation and management service by telephone and the availability of in person appointments. The patient expressed understanding and agreed to proceed.  Interactive audio and video telecommunications were attempted between this nurse and patient, however failed, due to patient having technical difficulties OR patient did not have access to video capability.  We continued and completed visit with audio only.  Some vital signs may be absent or patient reported.   Joseph [name] E Joseph Poellnitz, LPN   Review of Systems     Cardiac Risk Factors include: advanced age (>65mn, >>54women);diabetes mellitus;dyslipidemia;male gender;sedentary lifestyle;smoking/ tobacco exposure;Other (see comment), Risk factor comments: hx throat cancer     Objective:    Today's Vitals   03/03/22 1509 03/03/22 1510  Weight: 230 lb (104.3 kg)   PainSc:  6    Body mass index is 29.53 kg/m.     03/03/2022    3:18 PM 03/02/2021    4:25 PM 03/09/2019    2:39 PM 03/07/2018    3:29 PM 02/07/2017    8:18 AM 11/23/2016    2:08 PM 05/14/2015    1:51 PM  Advanced Directives  Does Patient Have a Medical Advance Directive? No Yes Yes Yes No Yes No;Yes  Type of ACorporate treasurerof [place]Living will [place]Living will [place]Living will  [place]Living will [place]Living will  Does patient want to make changes to medical  advance directive?   No - Patient declined No - Patient declined  No - Patient declined No - Patient declined  Copy of [place] Millin Chart?  No - copy requested No - copy requested No - copy requested  No - copy requested   Would patient like information on creating a medical advance directive? No - Patient declined    No - Patient declined  No - patient declined information    Current Medications (verified) Outpatient Encounter Medications as of 03/03/2022  Medication Sig   ACCU-CHEK AVIVA PLUS test strip TEST BLOOD DAILY AND AS NEEDED DX E11.22   albuterol (PROAIR HFA) 108 (90 Base) MCG/ACT inhaler Inhale 1-2 puffs into the lungs every 6 (six) hours as needed for wheezing or shortness of breath.   AMITIZA 24 MCG capsule Take 24 mcg by mouth 2 (two) times daily.   amLODipine (NORVASC) 5 MG tablet Take 1 tablet (5 mg total) by mouth daily.   ARIPiprazole (ABILIFY) 2 MG tablet Take 1 tablet (2 mg total) by mouth daily.   baclofen (LIORESAL) 10 MG tablet Take 10 mg by mouth 3 (three) times daily.   buPROPion (WELLBUTRIN XL) 150 MG 24 hr tablet Take 1 tablet (150 mg total) by mouth daily.   Cholecalciferol (VITAMIN D3) 5000 units CAPS Take 5,000 Units by mouth.   cyclobenzaprine (FLEXERIL) 10 MG tablet Take 10 mg by mouth 3 (three) times daily as needed.   fenofibrate 160 MG tablet TAKE 1 TABLET BY MOUTH EVERY DAY   hydrOXYzine (VISTARIL) 25 MG  capsule Take 25-50 mg by mouth at bedtime as needed.   levothyroxine (SYNTHROID) 175 MCG tablet Take 1 tablet (175 mcg total) by mouth daily.   methocarbamol (ROBAXIN) 500 MG tablet Take 500 mg by mouth 3 (three) times daily as needed.   morphine (MS CONTIN) 30 MG 12 hr tablet Take 30 mg by mouth every 8 (eight) hours.   oxyCODONE-acetaminophen (PERCOCET) 7.5-325 MG tablet TAKE 1 TABLET BY MOUTH EVERY FOUR HOURS AS NEEDED FOR PAIN   pantoprazole (PROTONIX) 20 MG tablet Take 1 tablet (20 mg total) by mouth daily.   pregabalin (LYRICA) 75  MG capsule Take 75 mg by mouth 3 (three) times daily.   rosuvastatin (CRESTOR) 10 MG tablet Take 1 tablet (10 mg total) by mouth daily.   tiotropium (SPIRIVA HANDIHALER) 18 MCG inhalation capsule Place 1 capsule (18 mcg total) into inhaler and inhale daily.   Ubrogepant (UBRELVY) 100 MG TABS Take 1 tablet by mouth daily as needed.   zolpidem (AMBIEN) 5 MG tablet Take 1 tablet (5 mg total) by mouth at bedtime as needed for sleep.   sertraline (ZOLOFT) 100 MG tablet Take 1.5 tablets (150 mg total) by mouth daily.   No facility-administered encounter medications on file as of 03/03/2022.    Allergies (verified) Patient has no known allergies.   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   Family History  Problem Relation Age of Onset   Emphysema Sister    Prostate cancer Maternal Grandfather    Clotting disorder Maternal Grandfather    Cancer Maternal Grandmother        brain   Depression Maternal Uncle    Atopy Neg Hx    Social History   Socioeconomic History   Marital status: Married    Spouse name: Joseph [name]   Number of children: 2   Years of education: 12   Highest education level: High school graduate  Occupational History   Occupation: disability  Tobacco Use   Smoking status: Former    Packs/day: 3.00    Years: 30.00    Total pack years: 90.00    Types: Cigarettes    Quit date: 07/20/2003    Years since quitting: 18.6   Smokeless tobacco: Never  Vaping Use   Vaping Use: Never used  Substance and Sexual Activity   Alcohol use: Not Currently    Comment: per week   Drug use: No   Sexual activity: Yes   Other Topics Concern   Not on file  Social History Narrative   Married with 2 children   Social Determinants of Health   Financial Resource Strain: Low Risk  (03/03/2022)   Overall Financial Resource Strain (CARDIA)    Difficulty of Paying Living Expenses: Not very hard  Food Insecurity: Food Insecurity Present (03/03/2022)   Hunger Vital Sign    Worried About Running Out of Food in the Last Year: Sometimes true    Ran Out of Food in the Last Year: Never true  Transportation Needs: No Transportation Needs (03/03/2022)  PRAPARE - Hydrologist (Medical): No    Lack of Transportation (Non-Medical): No  Physical Activity: Insufficiently Active (03/03/2022)   Exercise Vital Sign    Days of Exercise per Week: 4 days    Minutes of Exercise per Session: 20 min  Stress: Stress Concern Present (03/03/2022)   [place]    Feeling of Stress : To some extent  Social Connections: Socially Isolated (03/03/2022)   Social Connection and Isolation Panel [NHANES]    Frequency of Communication with Friends and Family: Once a week    Frequency of Social Gatherings with Friends and Family: Once a week    Attends Religious Services: Never    Marine scientist or Organizations: No    Attends Music therapist: Never    Marital Status: Married    Tobacco Counseling Counseling given: Not Answered   Clinical Intake:  Pre-visit preparation completed: Yes  Pain : 0-10 Pain Score: 6  Pain Type: Chronic pain Pain Location: Back Pain Orientation: Mid, Lower Pain Descriptors / Indicators: Aching, Discomfort Pain Onset: More than a month ago Pain Frequency: Intermittent     BMI - recorded: 29.53 Nutritional Status: BMI 25 -29 Overweight Nutritional Risks: None Diabetes: Yes CBG done?: No Did pt. bring in CBG monitor from home?: No  How often do you need to have someone help you when  you read instructions, pamphlets, or other written materials from your doctor or pharmacy?: 1 - Never  Diabetic? Nutrition Risk Assessment:  Has the patient had any N/V/D within the last 2 months?  No  Does the patient have any non-healing wounds?  No  Has the patient had any unintentional weight loss or weight gain?  No   Diabetes:  Is the patient diabetic?  Yes  If diabetic, was a CBG obtained today?  No  Did the patient bring in their glucometer from home?  No  How often do you monitor your CBG's? Only when he feels it is low or high.   Financial Strains and Diabetes Management:  Are you having any financial strains with the device, your supplies or your medication? No .  Does the patient want to be seen by Chronic Care Management for management of their diabetes?  No  Would the patient like to be referred to a Nutritionist or for Diabetic Management?  No   Diabetic Exams:  Diabetic Eye Exam: Completed 2023 per pt.  Diabetic Foot Exam: Completed 06/26/2021. Pt has been advised about the importance in completing this exam. Pt is scheduled for diabetic foot exam on next PCP visit.    Interpreter Needed?: No  Information entered by :: Majid Mccravy, LPN   Activities of Daily Living    03/03/2022    3:18 PM  In your present state of health, do you have any difficulty performing the following activities:  Hearing? 0  Vision? 0  Difficulty concentrating or making decisions? 0  Walking or climbing stairs? 0  Dressing or bathing? 0  Doing errands, shopping? 0  Preparing Food and eating ? N  Using the Toilet? N  In the past six months, have you accidently leaked urine? N  Do you have problems with loss of bowel control? N  Managing your Medications? N  Managing your Finances? N  Housekeeping or managing your Housekeeping? N    Patient Care Team: Dettinger, Fransisca Kaufmann, MD as PCP - General (Family Medicine) Margaretha Sheffield, MD  as Referring Physician (Physical Medicine and  Rehabilitation) Services, [facility] as Referring Physician (Psychiatry) Festus Aloe, MD as Consulting Physician (Urology)  Indicate any recent Medical Services you may have received from other than [facility] providers in the past year (date may be approximate).     Assessment:   This is a routine wellness examination for [place].  Hearing/Vision screen Hearing Screening - Comments:: Denies hearing difficulties   Vision Screening - Comments:: Wears rx glasses for reading prn only - up to date with routine eye exams with optometrist in [place] issues and exercise activities discussed: Current Exercise Habits: Home exercise routine, Type of exercise: walking, Time (Minutes): 20, Frequency (Times/Week): 4, Weekly Exercise (Minutes/Week): 80, Intensity: Mild, Exercise limited by: respiratory conditions(s)   Goals Addressed             This Visit's Progress    Patient Stated       Hopes his health doesn't decline anymore - feels like it gets worse every year - hopes to feel better and get more active       Depression Screen    03/03/2022    3:15 PM 12/25/2021    3:37 PM 09/24/2021    3:26 PM 08/18/2021    1:08 PM 06/26/2021    2:14 PM 03/26/2021    2:15 PM 03/26/2021    1:22 PM  PHQ 2/9 Scores  PHQ - 2 Score 0 0 0  0 1 1  PHQ- 9 Score   0   4 6     Information is confidential and restricted. Go to Review Flowsheets to unlock data.    Fall Risk    03/03/2022    3:11 PM 12/25/2021    3:37 PM 09/24/2021    3:25 PM 06/26/2021    1:57 PM 03/26/2021    2:15 PM  [place] in the past year? 0 0 0 0 0  Number falls in past yr: 0  0    Injury with Fall? 0  0    Risk for fall due to : Orthopedic patient      Follow up Falls prevention discussed        [place]:  Any stairs in or around the home? No  If so, are there any without handrails? No  Home free of loose throw rugs in walkways, pet beds, electrical cords, etc? Yes   Adequate lighting in your home to reduce risk of falls? Yes   ASSISTIVE DEVICES UTILIZED TO PREVENT FALLS:  Life alert? No  Use of a cane, walker or w/c? No  Grab bars in the bathroom? No  Shower chair or bench in shower? Yes  Elevated toilet seat or a handicapped toilet? No   TIMED UP AND GO:  Was the test performed? No . Telephonic visit  Cognitive Function:    03/07/2018    3:33 PM 11/23/2016    2:13 PM  MMSE - Mini Mental State Exam  Orientation to time 5 5  Orientation to Place 4 5  Registration 3 3  Attention/ Calculation 5 5  Recall 3 3  Language- name 2 objects 2 2  Language- repeat 1 1  Language- follow 3 step command 3 3  Language- read & follow direction 1 1  Write a sentence 1 1  Copy design 1 1  Total score 29 30        03/03/2022    3:20 PM 03/02/2021  4:25 PM 03/09/2019    2:42 PM  6CIT Screen  What Year? 0 points 0 points 0 points  What month? 0 points 0 points 0 points  What time? 0 points 0 points 0 points  Count back from 20 0 points 0 points 0 points  Months in reverse 0 points 0 points 0 points  Repeat phrase 0 points 0 points 2 points  Total Score 0 points 0 points 2 points    Immunizations Immunization History  Administered Date(s) Administered   Influenza Nasal 04/28/2012   Influenza,inj,Quad PF,6+ Mos 06/12/2013, 04/14/2016, 07/27/2017, 06/08/2018, 04/09/2019, 08/15/2020, 06/26/2021   Influenza-Unspecified 05/19/2009, 05/01/2010, 05/22/2015   Pneumococcal Conjugate-13 07/27/2017   Pneumococcal Polysaccharide-23 07/31/2018   Pneumococcal-Unspecified 04/28/2012   Rabies, IM 02/07/2017, 02/10/2017, 02/14/2017, 02/21/2017   Td 12/17/2011   Tdap 12/17/2011, 12/25/2021    TDAP status: Up to date  Flu Vaccine status: Up to date  Pneumococcal vaccine status: Up to date  Covid-19 vaccine status: Declined, Education has been provided regarding the importance of this vaccine but patient still declined. Advised may receive this vaccine  at local pharmacy or Health Dept.or vaccine clinic. Aware to provide a copy of the vaccination record if obtained from local pharmacy or Health Dept. Verbalized acceptance and understanding.  Qualifies for Shingles Vaccine? Yes   Zostavax completed No   Shingrix Completed?: No.    Education has been provided regarding the importance of this vaccine. Patient has been advised to call insurance company to determine out of pocket expense if they have not yet received this vaccine. Advised may also receive vaccine at local pharmacy or Health Dept. Verbalized acceptance and understanding.  Screening Tests Health Maintenance  Topic Date Due   OPHTHALMOLOGY EXAM  07/15/2018   URINE MICROALBUMIN  07/31/2020   INFLUENZA VACCINE  02/16/2022   Zoster Vaccines- Shingrix (1 of 2) 03/22/2022 (Originally 09/06/1979)   COVID-19 Vaccine (1) 03/29/2022 (Originally 09/05/1965)   COLONOSCOPY (Pts 45-34yr Insurance coverage will need to be confirmed)  12/26/2022 (Originally 09/05/2005)   FOOT EXAM  06/26/2022   HEMOGLOBIN A1C  06/26/2022   TETANUS/TDAP  12/26/2031   Hepatitis C Screening  Completed   HIV Screening  Completed   HPV VACCINES  Aged Out   COLON CANCER SCREENING ANNUAL FOBT  Discontinued    Health Maintenance  Health Maintenance Due  Topic Date Due   OPHTHALMOLOGY EXAM  07/15/2018   URINE MICROALBUMIN  07/31/2020   INFLUENZA VACCINE  02/16/2022    Colorectal cancer screening: Type of screening: Cologuard. Completed 03/12/2021. Repeat every 3 years  Lung Cancer Screening: (Low Dose CT Chest recommended if Age 61-80years, 30 pack-year currently smoking OR have quit w/in 15years.) does not qualify.   Additional Screening:  Hepatitis C Screening: does qualify; Completed 04/14/2016  Vision Screening: Recommended annual ophthalmology exams for early detection of glaucoma and other disorders of the eye. Is the patient up to date with their annual eye exam?  Yes  Who is the provider or what is  the name of the office in which the patient attends annual eye exams[place] a provider, would they like to be referred to a provider to establish care? No .   Dental Screening: Recommended annual dental exams for proper oral hygiene  Community Resource Referral / Chronic Care Management: CRR required this visit?  No   CCM required this visit?  No      Plan:     I have personally  reviewed and noted the following in the patient's chart:   Medical and social history Use of alcohol, tobacco or illicit drugs  Current medications and supplements including opioid prescriptions. Patient is currently taking opioid prescriptions. Information provided to patient regarding non-opioid alternatives. Patient advised to discuss non-opioid treatment plan with their provider. Functional ability and status Nutritional status Physical activity Advanced directives List of other physicians Hospitalizations, surgeries, and ER visits in previous 12 months Vitals Screenings to include cognitive, depression, and falls Referrals and appointments  In addition, I have reviewed and discussed with patient certain preventive protocols, quality metrics, and best practice recommendations. A written personalized care plan for preventive services as well as general preventive health recommendations were provided to patient.     Sandrea Hammond, LPN   [id number]/2992   Nurse Notes: None

## 2022-03-03 NOTE — Patient Instructions (Signed)
Joseph [name] , Thank you for taking time to come for your Medicare Wellness Visit. I appreciate your ongoing commitment to your health goals. Please review the following plan we discussed and let me know if I can assist you in the future.   Screening recommendations/referrals: Colonoscopy: Cologuard done 03/12/2021 - Repeat in 3 years   Recommended yearly ophthalmology/optometry visit for glaucoma screening and checkup Recommended yearly dental visit for hygiene and checkup  Vaccinations: Influenza vaccine: Done 06/26/2021 - Repeat annually  Pneumococcal vaccine: Done 04/28/2012, 07/27/2017, & 07/31/2018 Tdap vaccine: Done 12/25/2021 - Repeat in 10 years  Shingles vaccine: Due - Shingrix is 2 doses 2-6 months apart and over 90% effective     Covid-19: declined  Advanced directives: Advance directive discussed with you today. Even though you declined this today, please call our office should you change your mind, and we can give you the proper paperwork for you to fill out.   Conditions/risks identified: Each day, aim for 6 glasses of water, plenty of protein in your diet and try to get up and walk/ stretch every hour for 5-10 minutes at a time.  Next appointment: Follow up in one year for your annual wellness visit   Preventive Care 40-64 Years, Male Preventive care refers to lifestyle choices and visits with your health care provider that can promote health and wellness. What does preventive care include? A yearly physical exam. This is also called an annual well check. Dental exams once or twice a year. Routine eye exams. Ask your health care provider how often you should have your eyes checked. Personal lifestyle choices, including: Daily care of your teeth and gums. Regular physical activity. Eating a healthy diet. Avoiding tobacco and drug use. Limiting alcohol use. Practicing safe sex. Taking low-dose aspirin every day starting at age 44. What happens during an annual well  check? The services and screenings done by your health care provider during your annual well check will depend on your age, overall health, lifestyle risk factors, and family history of disease. Counseling  Your health care provider may ask you questions about your: Alcohol use. Tobacco use. Drug use. Emotional well-being. Home and relationship well-being. Sexual activity. Eating habits. Work and work Statistician. Screening  You may have the following tests or measurements: Height, weight, and BMI. Blood pressure. Lipid and cholesterol levels. These may be checked every 5 years, or more frequently if you are over 91 years old. Skin check. Lung cancer screening. You may have this screening every year starting at age 56 if you have a 30-pack-year history of smoking and currently smoke or have quit within the past 15 years. Fecal occult blood test (FOBT) of the stool. You may have this test every year starting at age 106. Flexible sigmoidoscopy or colonoscopy. You may have a sigmoidoscopy every 5 years or a colonoscopy every 10 years starting at age 17. Prostate cancer screening. Recommendations will vary depending on your family history and other risks. Hepatitis C blood test. Hepatitis B blood test. Sexually transmitted disease (STD) testing. Diabetes screening. This is done by checking your blood sugar (glucose) after you have not eaten for a while (fasting). You may have this done every 1-3 years. Discuss your test results, treatment options, and if necessary, the need for more tests with your health care provider. Vaccines  Your health care provider may recommend certain vaccines, such as: Influenza vaccine. This is recommended every year. Tetanus, diphtheria, and acellular pertussis (Tdap, Td) vaccine. You may need a Td  booster every 10 years. Zoster vaccine. You may need this after age 69. Pneumococcal 13-valent conjugate (PCV13) vaccine. You may need this if you have certain  conditions and have not been vaccinated. Pneumococcal polysaccharide (PPSV23) vaccine. You may need one or two doses if you smoke cigarettes or if you have certain conditions. Talk to your health care provider about which screenings and vaccines you need and how often you need them. This information is not intended to replace advice given to you by your health care provider. Make sure you discuss any questions you have with your health care provider. Document Released: 08/01/2015 Document Revised: 03/24/2016 Document Reviewed: 05/06/2015 Elsevier Interactive Patient Education  2017 [employer] Prevention in the Home Falls can cause injuries. They can happen to people of all ages. There are many things you can do to make your home safe and to help prevent falls. What can I do on the outside of my home? Regularly fix the edges of walkways and driveways and fix any cracks. Remove anything that might make you trip as you walk through a door, such as a raised step or threshold. Trim any bushes or trees on the path to your home. Use bright outdoor lighting. Clear any walking paths of anything that might make someone trip, such as rocks or tools. Regularly check to see if handrails are loose or broken. Make sure that both sides of any steps have handrails. Any raised decks and porches should have guardrails on the edges. Have any leaves, snow, or ice cleared regularly. Use sand or salt on walking paths during winter. Clean up any spills in your garage right away. This includes oil or grease spills. What can I do in the bathroom? Use night lights. Install grab bars by the toilet and in the tub and shower. Do not use towel bars as grab bars. Use non-skid mats or decals in the tub or shower. If you need to sit down in the shower, use a plastic, non-slip stool. Keep the floor dry. Clean up any water that spills on the floor as soon as it happens. Remove soap buildup in the tub or shower  regularly. Attach bath mats securely with double-sided non-slip rug tape. Do not have throw rugs and other things on the floor that can make you trip. What can I do in the bedroom? Use night lights. Make sure that you have a light by your bed that is easy to reach. Do not use any sheets or blankets that are too big for your bed. They should not hang down onto the floor. Have a firm chair that has side arms. You can use this for support while you get dressed. Do not have throw rugs and other things on the floor that can make you trip. What can I do in the kitchen? Clean up any spills right away. Avoid walking on wet floors. Keep items that you use a lot in easy-to-reach places. If you need to reach something above you, use a strong step stool that has a grab bar. Keep electrical cords out of the way. Do not use floor polish or wax that makes floors slippery. If you must use wax, use non-skid floor wax. Do not have throw rugs and other things on the floor that can make you trip. What can I do with my stairs? Do not leave any items on the stairs. Make sure that there are handrails on both sides of the stairs and use them. Fix handrails that  are broken or loose. Make sure that handrails are as long as the stairways. Check any carpeting to make sure that it is firmly attached to the stairs. Fix any carpet that is loose or worn. Avoid having throw rugs at the top or bottom of the stairs. If you do have throw rugs, attach them to the floor with carpet tape. Make sure that you have a light switch at the top of the stairs and the bottom of the stairs. If you do not have them, ask someone to add them for you. What else can I do to help prevent falls? Wear shoes that: Do not have high heels. Have rubber bottoms. Are comfortable and fit you well. Are closed at the toe. Do not wear sandals. If you use a stepladder: Make sure that it is fully opened. Do not climb a closed stepladder. Make sure that  both sides of the stepladder are locked into place. Ask someone to hold it for you, if possible. Clearly mark and make sure that you can see: Any grab bars or handrails. First and last steps. Where the edge of each step is. Use tools that help you move around (mobility aids) if they are needed. These include: Canes. Walkers. Scooters. Crutches. Turn on the lights when you go into a dark area. Replace any light bulbs as soon as they burn out. Set up your furniture so you have a clear path. Avoid moving your furniture around. If any of your floors are uneven, fix them. If there are any pets around you, be aware of where they are. Review your medicines with your doctor. Some medicines can make you feel dizzy. This can increase your chance of falling. Ask your doctor what other things that you can do to help prevent falls. This information is not intended to replace advice given to you by your health care provider. Make sure you discuss any questions you have with your health care provider. Document Released: 05/01/2009 Document Revised: 12/11/2015 Document Reviewed: 08/09/2014 Elsevier Interactive Patient Education  2017 [employer].

## 2022-03-15 DIAGNOSIS — G894 Chronic pain syndrome: Secondary | ICD-10-CM | POA: Diagnosis not present

## 2022-03-15 DIAGNOSIS — M47817 Spondylosis without myelopathy or radiculopathy, lumbosacral region: Secondary | ICD-10-CM | POA: Diagnosis not present

## 2022-03-15 DIAGNOSIS — K5900 Constipation, unspecified: Secondary | ICD-10-CM | POA: Diagnosis not present

## 2022-03-15 DIAGNOSIS — E1142 Type 2 diabetes mellitus with diabetic polyneuropathy: Secondary | ICD-10-CM | POA: Diagnosis not present
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
## 2022-03-29 ENCOUNTER — Telehealth (INDEPENDENT_AMBULATORY_CARE_PROVIDER_SITE_OTHER): Payer: Medicare Other | Admitting: Psychiatry

## 2022-03-29 ENCOUNTER — Encounter: Payer: Self-pay | Admitting: Psychiatry

## 2022-03-29 DIAGNOSIS — G4700 Insomnia, unspecified: Secondary | ICD-10-CM | POA: Diagnosis not present

## 2022-03-29 DIAGNOSIS — F330 Major depressive disorder, recurrent, mild: Secondary | ICD-10-CM

## 2022-03-29 MED ORDER — ZOLPIDEM TARTRATE 5 MG PO TABS
5.0000 mg | ORAL_TABLET | Freq: Every evening | ORAL | 1 refills | Status: DC | PRN
Start: 2022-04-17 — End: 2022-06-07

## 2022-03-29 NOTE — Patient Instructions (Signed)
Continue sertraline 150 mg daily Continue bupropion 150 mg at night  Continue Abilify 2 mg at night  Continue Ambien 5 mg at night as needed for insomnia Next appointment: 11/20 at 3 PM

## 2022-03-31 ENCOUNTER — Ambulatory Visit (INDEPENDENT_AMBULATORY_CARE_PROVIDER_SITE_OTHER): Payer: Medicare Other | Admitting: Family Medicine

## 2022-03-31 ENCOUNTER — Encounter: Payer: Self-pay | Admitting: Family Medicine

## 2022-03-31 VITALS — BP 168/98 | HR 99 | Ht 74.0 in | Wt 229.0 lb

## 2022-03-31 DIAGNOSIS — N401 Enlarged prostate with lower urinary tract symptoms: Secondary | ICD-10-CM | POA: Diagnosis not present

## 2022-03-31 DIAGNOSIS — G4489 Other headache syndrome: Secondary | ICD-10-CM

## 2022-03-31 DIAGNOSIS — R3911 Hesitancy of micturition: Secondary | ICD-10-CM | POA: Diagnosis not present

## 2022-03-31 DIAGNOSIS — E1169 Type 2 diabetes mellitus with other specified complication: Secondary | ICD-10-CM

## 2022-03-31 DIAGNOSIS — E0822 Diabetes mellitus due to underlying condition with diabetic chronic kidney disease: Secondary | ICD-10-CM | POA: Diagnosis not present

## 2022-03-31 DIAGNOSIS — Z794 Long term (current) use of insulin: Secondary | ICD-10-CM | POA: Diagnosis not present

## 2022-03-31 DIAGNOSIS — E785 Hyperlipidemia, unspecified: Secondary | ICD-10-CM

## 2022-03-31 DIAGNOSIS — N1831 Chronic kidney disease, stage 3a: Secondary | ICD-10-CM

## 2022-03-31 DIAGNOSIS — E1159 Type 2 diabetes mellitus with other circulatory complications: Secondary | ICD-10-CM

## 2022-03-31 DIAGNOSIS — Z23 Encounter for immunization: Secondary | ICD-10-CM | POA: Diagnosis not present

## 2022-03-31 DIAGNOSIS — I152 Hypertension secondary to endocrine disorders: Secondary | ICD-10-CM | POA: Diagnosis not present

## 2022-03-31 DIAGNOSIS — E039 Hypothyroidism, unspecified: Secondary | ICD-10-CM

## 2022-03-31 LAB — BAYER DCA HB A1C WAIVED: HB A1C (BAYER DCA - WAIVED): 6.7 % — ABNORMAL HIGH (ref 4.8–5.6)

## 2022-03-31 MED ORDER — RIZATRIPTAN BENZOATE 10 MG PO TABS: 10.0000 mg | ORAL_TABLET | ORAL | 1 refills | Status: DC | PRN

## 2022-03-31 MED ORDER — TAMSULOSIN HCL 0.4 MG PO CAPS: 0.4000 mg | ORAL_CAPSULE | Freq: Every day | ORAL | 3 refills | Status: DC

## 2022-03-31 NOTE — Progress Notes (Addendum)
BP (!) 168/98   Pulse 99   Ht 6' 2"  (1.88 m)   Wt 229 lb (103.9 kg)   SpO2 93%   BMI 29.40 kg/m    Subjective:   Patient ID: Joseph [name], male    DOB: [birth year], 61 y.o.   MRN: [id number]  HPI: [name] is a 61 y.o. male presenting on 03/31/2022 for Medical Management of Chronic Issues, Diabetes, Hypothyroidism, and Hyperlipidemia   HPI Type 2 diabetes mellitus Patient comes in today for recheck of his diabetes. Patient has been currently taking no medicine currently, diet control. Patient is not currently on an ACE inhibitor/ARB. Patient has not seen an ophthalmologist this year. Patient denies any issues with their feet. The symptom started onset as an adult hypertension and hyperlipidemia and hypothyroidism ARE RELATED TO DM   Hyperlipidemia Patient is coming in for recheck of his hyperlipidemia. The patient is currently taking Crestor and fenofibrate. They deny any issues with myalgias or history of liver damage from it. They deny any focal numbness or weakness or chest pain.   Hypothyroidism recheck Patient is coming in for thyroid recheck today as well. They deny any issues with hair changes or heat or cold problems or diarrhea or constipation. They deny any chest pain or palpitations. They are currently on levothyroxine 175 micrograms   Hypertension Patient is currently on amlodipine, and their blood pressure today is 168/98 but he says at home when he was lightheaded or dizzy it was as low as 111 and sometimes as low as 90 systolic.  He says he has been trying to drink lots of water but it does happen sometimes it gets low in the afternoons.. Patient is having lightheadedness and dizziness, especially when he stands up quickly and its been going on for the past month.. Patient denies headaches, blurred vision, chest pains, shortness of breath, or weakness. Denies any side effects from medication and is content with current medication.  The lightheadedness and  dizziness has been increasing over the past 2 to 3 weeks.  BPH Patient is coming in for recheck on BPH Symptoms: Urinary hesitancy and weak stream and nocturia Medication: None currently but will try Flomax Last PSA: We will do at next visit.  Relevant past medical, surgical, family and social history reviewed and updated as indicated. Interim medical history since our last visit reviewed. Allergies and medications reviewed and updated.  Review of Systems  Constitutional:  Negative for chills and fever.  Eyes:  Negative for visual disturbance.  Respiratory:  Negative for shortness of breath and wheezing.   Cardiovascular:  Negative for chest pain and leg swelling.  Skin:  Negative for rash.  Neurological:  Positive for dizziness, light-headedness and headaches.  All other systems reviewed and are negative.   Per HPI unless specifically indicated above   Allergies as of 03/31/2022   No Known Allergies      Medication List        Accurate as of March 31, 2022  4:10 PM. If you have any questions, ask your nurse or doctor.          STOP taking these medications    Ubrelvy 100 MG Tabs Generic drug: Ubrogepant Stopped by: Fransisca Kaufmann Shyheim Tanney, MD       TAKE these medications    Accu-Chek Aviva Plus test strip Generic drug: glucose blood TEST BLOOD DAILY AND AS NEEDED DX E11.22   albuterol 108 (90 Base) MCG/ACT inhaler Commonly known as: ProAir HFA Inhale  1-2 puffs into the lungs every 6 (six) hours as needed for wheezing or shortness of breath.   Amitiza 24 MCG capsule Generic drug: lubiprostone Take 24 mcg by mouth 2 (two) times daily.   amLODipine 5 MG tablet Commonly known as: NORVASC Take 1 tablet (5 mg total) by mouth daily.   ARIPiprazole 2 MG tablet Commonly known as: ABILIFY Take 1 tablet (2 mg total) by mouth daily.   baclofen 10 MG tablet Commonly known as: LIORESAL Take 10 mg by mouth 3 (three) times daily.   buPROPion 150 MG 24 hr  tablet Commonly known as: WELLBUTRIN XL Take 1 tablet (150 mg total) by mouth daily.   cyclobenzaprine 10 MG tablet Commonly known as: FLEXERIL Take 10 mg by mouth 3 (three) times daily as needed.   fenofibrate 160 MG tablet TAKE 1 TABLET BY MOUTH EVERY DAY   hydrOXYzine 25 MG capsule Commonly known as: VISTARIL Take 25-50 mg by mouth at bedtime as needed.   levothyroxine 175 MCG tablet Commonly known as: SYNTHROID Take 1 tablet (175 mcg total) by mouth daily.   methocarbamol 500 MG tablet Commonly known as: ROBAXIN Take 500 mg by mouth 3 (three) times daily as needed.   morphine 30 MG 12 hr tablet Commonly known as: MS CONTIN Take 30 mg by mouth every 8 (eight) hours.   oxyCODONE-acetaminophen 7.5-325 MG tablet Commonly known as: PERCOCET TAKE 1 TABLET BY MOUTH EVERY FOUR HOURS AS NEEDED FOR PAIN   pantoprazole 20 MG tablet Commonly known as: PROTONIX Take 1 tablet (20 mg total) by mouth daily.   pregabalin 75 MG capsule Commonly known as: LYRICA Take 75 mg by mouth 3 (three) times daily.   rizatriptan 10 MG tablet Commonly known as: Maxalt Take 1 tablet (10 mg total) by mouth as needed for migraine. May repeat in 2 hours if needed Started by: Fransisca Kaufmann Jyla Hopf, MD   rosuvastatin 10 MG tablet Commonly known as: CRESTOR Take 1 tablet (10 mg total) by mouth daily.   sertraline 100 MG tablet Commonly known as: ZOLOFT Take 1.5 tablets (150 mg total) by mouth daily.   Spiriva HandiHaler 18 MCG inhalation capsule Generic drug: tiotropium Place 1 capsule (18 mcg total) into inhaler and inhale daily.   Vitamin D3 125 MCG (5000 UT) Caps Take 5,000 Units by mouth.   zolpidem 5 MG tablet Commonly known as: AMBIEN Take 1 tablet (5 mg total) by mouth at bedtime as needed for sleep. Start taking on: April 17, 2022         Objective:   BP (!) 168/98   Pulse 99   Ht 6' 2"  (1.88 m)   Wt 229 lb (103.9 kg)   SpO2 93%   BMI 29.40 kg/m   Wt Readings from  Last 3 Encounters:  03/31/22 229 lb (103.9 kg)  03/03/22 230 lb (104.3 kg)  12/25/21 233 lb (105.7 kg)    Physical Exam Vitals and nursing note reviewed.  Constitutional:      General: He is not in acute distress.    Appearance: He is well-developed. He is not diaphoretic.  Eyes:     General: No scleral icterus.    Conjunctiva/sclera: Conjunctivae normal.  Neck:     Thyroid: No thyromegaly.  Cardiovascular:     Rate and Rhythm: Normal rate and regular rhythm.     Heart sounds: Normal heart sounds. No murmur heard. Pulmonary:     Effort: Pulmonary effort is normal. No respiratory distress.  Breath sounds: Normal breath sounds. No wheezing.  Musculoskeletal:     Cervical back: Neck supple.  Lymphadenopathy:     Cervical: No cervical adenopathy.  Skin:    General: Skin is warm and dry.     Findings: No rash.  Neurological:     Mental Status: He is alert and oriented to person, place, and time.     Coordination: Coordination normal.  Psychiatric:        Behavior: Behavior normal.       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  Patient's A1c is 6.7, looks good. With low blood pressures at home and dizziness, recommended that he try to cut his amlodipine in half and taking it more consistently.  We will add Flomax to help with urinary hesitancy and BPH symptoms. Follow up plan: Return in about 3 months (around 06/30/2022), or if symptoms worsen or fail to improve, for Diabetes and hyperlipidemia and hypothyroidism.  Counseling provided for all of the vaccine components Orders Placed This Encounter  Procedures   CBC with Differential/Platelet   CMP14+EGFR   Lipid panel   Bayer DCA Hb A1c Waived   TSH    Caryl Pina, MD [facility] 03/31/2022, 4:10 PM

## 2022-03-31 NOTE — Addendum Note (Signed)
Addended by: Caryl Pina on: 03/31/2022 04:16 PM   Modules accepted: Orders

## 2022-04-01 LAB — CBC WITH DIFFERENTIAL/PLATELET
Basophils Absolute: 0.1 10*3/uL (ref 0.0–0.2)
Basos: 1 %
EOS (ABSOLUTE): 0.2 10*3/uL (ref 0.0–0.4)
Eos: 4 %
Hematocrit: 38.4 % (ref 37.5–51.0)
Hemoglobin: 12.7 g/dL — ABNORMAL LOW (ref 13.0–17.7)
Immature Grans (Abs): 0 10*3/uL (ref 0.0–0.1)
Immature Granulocytes: 0 %
Lymphocytes Absolute: 2 10*3/uL (ref 0.7–3.1)
Lymphs: 41 %
MCH: 27.8 pg (ref 26.6–33.0)
MCHC: 33.1 g/dL (ref 31.5–35.7)
MCV: 84 fL (ref 79–97)
Monocytes Absolute: 0.5 10*3/uL (ref 0.1–0.9)
Monocytes: 10 %
Neutrophils Absolute: 2.1 10*3/uL (ref 1.4–7.0)
Neutrophils: 44 %
Platelets: 232 10*3/uL (ref 150–450)
RBC: 4.57 x10E6/uL (ref 4.14–5.80)
RDW: 13 % (ref 11.6–15.4)
WBC: 4.8 10*3/uL (ref 3.4–10.8)

## 2022-04-01 LAB — LIPID PANEL
Chol/HDL Ratio: 4.6 ratio (ref 0.0–5.0)
Cholesterol, Total: 166 mg/dL (ref 100–199)
HDL: 36 mg/dL — ABNORMAL LOW (ref >39)
LDL Chol Calc (NIH): 70 mg/dL (ref 0–99)
Triglycerides: 378 mg/dL — ABNORMAL HIGH (ref 0–149)
VLDL Cholesterol Cal: 60 mg/dL — ABNORMAL HIGH (ref 5–40)

## 2022-04-01 LAB — CMP14+EGFR
ALT: 27 IU/L (ref 0–44)
AST: 20 IU/L (ref 0–40)
Albumin/Globulin Ratio: 1.7 (ref 1.2–2.2)
Albumin: 4.3 g/dL (ref 3.9–4.9)
Alkaline Phosphatase: 31 IU/L — ABNORMAL LOW (ref 44–121)
BUN/Creatinine Ratio: 18 (ref 10–24)
BUN: 25 mg/dL (ref 8–27)
Bilirubin Total: 0.2 mg/dL (ref 0.0–1.2)
CO2: 24 mmol/L (ref 20–29)
Calcium: 10 mg/dL (ref 8.6–10.2)
Chloride: 100 mmol/L (ref 96–106)
Creatinine, Ser: 1.37 mg/dL — ABNORMAL HIGH (ref 0.76–1.27)
Globulin, Total: 2.5 g/dL (ref 1.5–4.5)
Glucose: 157 mg/dL — ABNORMAL HIGH (ref 70–99)
Potassium: 5.3 mmol/L — ABNORMAL HIGH (ref 3.5–5.2)
Sodium: 140 mmol/L (ref 134–144)
Total Protein: 6.8 g/dL (ref 6.0–8.5)
eGFR: 59 mL/min/{1.73_m2} — ABNORMAL LOW (ref >59)

## 2022-04-01 LAB — TSH: TSH: 0.041 u[IU]/mL — ABNORMAL LOW (ref 0.450–4.500)

## 2022-04-14 ENCOUNTER — Other Ambulatory Visit: Payer: Self-pay | Admitting: Family Medicine

## 2022-04-14 ENCOUNTER — Other Ambulatory Visit: Payer: Self-pay | Admitting: Psychiatry

## 2022-04-14 DIAGNOSIS — E1169 Type 2 diabetes mellitus with other specified complication: Secondary | ICD-10-CM

## 2022-04-14 DIAGNOSIS — I152 Hypertension secondary to endocrine disorders: Secondary | ICD-10-CM

## 2022-04-14 DIAGNOSIS — E782 Mixed hyperlipidemia: Secondary | ICD-10-CM

## 2022-04-15 ENCOUNTER — Other Ambulatory Visit: Payer: Self-pay | Admitting: Psychiatry

## 2022-04-19 ENCOUNTER — Telehealth: Payer: Self-pay

## 2022-04-19 NOTE — Telephone Encounter (Signed)
the one listed in the system.  [facility]

## 2022-04-19 NOTE — Telephone Encounter (Signed)
pt spouse left a message that a refill on the [place].

## 2022-04-19 NOTE — Telephone Encounter (Signed)
Spoke with the pharmacy. The patient picked up the medication already.

## 2022-04-19 NOTE — Telephone Encounter (Signed)
Left a message for patient that rx was sent on 03-29-22 @ 2:[facility] [place].  Pt was last seen on 03-29-22 next appt 05-20-22    Disp Refills Start End   zolpidem (AMBIEN) 5 MG tablet 30 tablet 1 04/17/2022 06/16/2022   Sig - Route: Take 1 tablet (5 mg total) by mouth at bedtime as needed for sleep. - Oral   Sent to pharmacy as: zolpidem (AMBIEN) 5 MG tablet   Notes to Pharmacy: Fill when due   E-Prescribing Status: Receipt confirmed by pharmacy (03/29/2022  2:15 PM EDT)

## 2022-04-19 NOTE — Telephone Encounter (Signed)
Please verify with the pharmacy- he should have the order.

## 2022-05-12 ENCOUNTER — Other Ambulatory Visit: Payer: Self-pay | Admitting: Psychiatry

## 2022-05-12 DIAGNOSIS — F330 Major depressive disorder, recurrent, mild: Secondary | ICD-10-CM

## 2022-05-13 DIAGNOSIS — E1142 Type 2 diabetes mellitus with diabetic polyneuropathy: Secondary | ICD-10-CM | POA: Diagnosis not present

## 2022-05-13 DIAGNOSIS — M47817 Spondylosis without myelopathy or radiculopathy, lumbosacral region: Secondary | ICD-10-CM | POA: Diagnosis not present

## 2022-05-13 DIAGNOSIS — K5900 Constipation, unspecified: Secondary | ICD-10-CM | POA: Diagnosis not present

## 2022-05-13 DIAGNOSIS — G894 Chronic pain syndrome: Secondary | ICD-10-CM | POA: Diagnosis not present

## 2022-05-18 ENCOUNTER — Other Ambulatory Visit: Payer: Self-pay | Admitting: Psychiatry

## 2022-05-18 DIAGNOSIS — F330 Major depressive disorder, recurrent, mild: Secondary | ICD-10-CM

## 2022-06-07 ENCOUNTER — Encounter: Payer: Self-pay | Admitting: Psychiatry

## 2022-06-07 ENCOUNTER — Telehealth (INDEPENDENT_AMBULATORY_CARE_PROVIDER_SITE_OTHER): Payer: Medicare Other | Admitting: Psychiatry

## 2022-06-07 DIAGNOSIS — G4700 Insomnia, unspecified: Secondary | ICD-10-CM | POA: Diagnosis not present

## 2022-06-07 DIAGNOSIS — F3341 Major depressive disorder, recurrent, in partial remission: Secondary | ICD-10-CM | POA: Diagnosis not present

## 2022-06-07 MED ORDER — ZOLPIDEM TARTRATE 5 MG PO TABS: 5.0000 mg | ORAL_TABLET | Freq: Every evening | ORAL | 1 refills | Status: DC | PRN

## 2022-06-07 NOTE — Patient Instructions (Signed)
Continue sertraline 150 mg daily  Continue bupropion 150 mg at night Continue Abilify 2 mg at night  Continue Ambien 5 mg at night as needed for insomnia Next appointment: 1/25 at 3:30
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
## 2022-06-09 ENCOUNTER — Other Ambulatory Visit: Payer: Self-pay | Admitting: Family Medicine

## 2022-06-09 DIAGNOSIS — G4489 Other headache syndrome: Secondary | ICD-10-CM

## 2022-06-14 DIAGNOSIS — M47817 Spondylosis without myelopathy or radiculopathy, lumbosacral region: Secondary | ICD-10-CM | POA: Diagnosis not present

## 2022-06-14 DIAGNOSIS — E1142 Type 2 diabetes mellitus with diabetic polyneuropathy: Secondary | ICD-10-CM | POA: Diagnosis not present

## 2022-06-14 DIAGNOSIS — G894 Chronic pain syndrome: Secondary | ICD-10-CM | POA: Diagnosis not present

## 2022-06-14 DIAGNOSIS — K5900 Constipation, unspecified: Secondary | ICD-10-CM | POA: Diagnosis not present

## 2022-06-20 ENCOUNTER — Other Ambulatory Visit: Payer: Self-pay | Admitting: Psychiatry

## 2022-06-20 ENCOUNTER — Other Ambulatory Visit: Payer: Self-pay | Admitting: Family Medicine

## 2022-06-20 DIAGNOSIS — E782 Mixed hyperlipidemia: Secondary | ICD-10-CM

## 2022-06-20 DIAGNOSIS — G4489 Other headache syndrome: Secondary | ICD-10-CM

## 2022-06-20 DIAGNOSIS — E1169 Type 2 diabetes mellitus with other specified complication: Secondary | ICD-10-CM

## 2022-06-20 DIAGNOSIS — F330 Major depressive disorder, recurrent, mild: Secondary | ICD-10-CM

## 2022-06-20 DIAGNOSIS — E1159 Type 2 diabetes mellitus with other circulatory complications: Secondary | ICD-10-CM

## 2022-06-20 NOTE — Telephone Encounter (Signed)
Ordered refill per request. According to the chart, he is still not scheduled with Dr. Nehemiah Settle. Could you find out what is the status of this referral to [facility]? Thanks.

## 2022-06-25 ENCOUNTER — Other Ambulatory Visit: Payer: Self-pay | Admitting: Psychiatry

## 2022-07-01 ENCOUNTER — Ambulatory Visit (INDEPENDENT_AMBULATORY_CARE_PROVIDER_SITE_OTHER): Payer: Medicare Other | Admitting: Family Medicine

## 2022-07-01 ENCOUNTER — Encounter: Payer: Self-pay | Admitting: Family Medicine

## 2022-07-01 VITALS — BP 126/78 | HR 103 | Ht 74.0 in | Wt 229.0 lb

## 2022-07-01 DIAGNOSIS — E785 Hyperlipidemia, unspecified: Secondary | ICD-10-CM | POA: Diagnosis not present

## 2022-07-01 DIAGNOSIS — E039 Hypothyroidism, unspecified: Secondary | ICD-10-CM

## 2022-07-01 DIAGNOSIS — E0822 Diabetes mellitus due to underlying condition with diabetic chronic kidney disease: Secondary | ICD-10-CM

## 2022-07-01 DIAGNOSIS — N1831 Chronic kidney disease, stage 3a: Secondary | ICD-10-CM | POA: Diagnosis not present

## 2022-07-01 DIAGNOSIS — R3911 Hesitancy of micturition: Secondary | ICD-10-CM

## 2022-07-01 DIAGNOSIS — E1169 Type 2 diabetes mellitus with other specified complication: Secondary | ICD-10-CM | POA: Diagnosis not present

## 2022-07-01 DIAGNOSIS — R0981 Nasal congestion: Secondary | ICD-10-CM

## 2022-07-01 DIAGNOSIS — I152 Hypertension secondary to endocrine disorders: Secondary | ICD-10-CM

## 2022-07-01 DIAGNOSIS — N401 Enlarged prostate with lower urinary tract symptoms: Secondary | ICD-10-CM | POA: Diagnosis not present

## 2022-07-01 DIAGNOSIS — E782 Mixed hyperlipidemia: Secondary | ICD-10-CM

## 2022-07-01 DIAGNOSIS — Z794 Long term (current) use of insulin: Secondary | ICD-10-CM | POA: Diagnosis not present

## 2022-07-01 DIAGNOSIS — E1159 Type 2 diabetes mellitus with other circulatory complications: Secondary | ICD-10-CM | POA: Diagnosis not present

## 2022-07-01 LAB — BAYER DCA HB A1C WAIVED: HB A1C (BAYER DCA - WAIVED): 7.3 % — ABNORMAL HIGH (ref 4.8–5.6)

## 2022-07-01 MED ORDER — FINASTERIDE 5 MG PO TABS: 5.0000 mg | ORAL_TABLET | Freq: Every day | ORAL | 3 refills | Status: DC

## 2022-07-01 MED ORDER — CETIRIZINE HCL 10 MG PO TABS: 10.0000 mg | ORAL_TABLET | Freq: Every day | ORAL | 11 refills | Status: DC

## 2022-07-01 MED ORDER — ROSUVASTATIN CALCIUM 10 MG PO TABS: 10.0000 mg | ORAL_TABLET | Freq: Every day | ORAL | 3 refills | Status: DC

## 2022-07-01 MED ORDER — FENOFIBRATE 160 MG PO TABS: 160.0000 mg | ORAL_TABLET | Freq: Every day | ORAL | 3 refills | Status: DC

## 2022-07-01 NOTE — Progress Notes (Signed)
BP 126/78   Pulse (!) 103   Ht _0  (1.88 m)   Wt 229 lb (103.9 kg)   SpO2 97%   BMI 29.40 kg/m    Subjective:   Patient ID: Joseph [name], male    DOB: 02-09-1961, 61 y.o.   MRN: [id number]  HPI: BARNELL SHIEH is a 61 y.o. male presenting on 07/01/2022 for Medical Management of Chronic Issues, Hyperlipidemia, Hypothyroidism, and Diabetes   HPI Type 2 diabetes mellitus Patient comes in today for recheck of his diabetes. Patient has been currently taking no medicine currently, has been diet controlled. Patient is not currently on an ACE inhibitor/ARB. Patient has not seen an ophthalmologist this year. Patient denies any issues with their feet. The symptom started onset as an adult hypothyroidism hypertension and hyperlipidemia ARE RELATED TO DM   Hypothyroidism recheck Patient is coming in for thyroid recheck today as well. They deny any issues with hair changes or heat or cold problems or diarrhea or constipation. They deny any chest pain or palpitations. They are currently on levothyroxine 175 micrograms   Hypertension Patient is currently on no medicine currently, stopped amlodipine, and their blood pressure today is 126/70. Patient denies any lightheadedness or dizziness. Patient denies headaches, blurred vision, chest pains, shortness of breath, or weakness. Denies any side effects from medication and is content with current medication.   Hyperlipidemia Patient is coming in for recheck of his hyperlipidemia. The patient is currently taking Crestor. They deny any issues with myalgias or history of liver damage from it. They deny any focal numbness or weakness or chest pain.   Patient has nasal congestion is recurrent and has been trying Flonase and Afrin they seem to help a little but not much.  BPH Patient is coming in for recheck on BPH Symptoms: Urinary hesitancy and weakness and nocturia Medication: Tamsulosin, worked initially but not any more Last PSA:  Unknown  Relevant past medical, surgical, family and social history reviewed and updated as indicated. Interim medical history since our last visit reviewed. Allergies and medications reviewed and updated.  Review of Systems  Constitutional:  Negative for chills and fever.  Eyes:  Negative for visual disturbance.  Respiratory:  Negative for shortness of breath and wheezing.   Cardiovascular:  Negative for chest pain and leg swelling.  Genitourinary:  Positive for difficulty urinating and frequency.  Musculoskeletal:  Negative for back pain and gait problem.  Skin:  Negative for rash.  Neurological:  Negative for dizziness, weakness and light-headedness.  All other systems reviewed and are negative.   Per HPI unless specifically indicated above   Allergies as of 07/01/2022   No Known Allergies      Medication List        Accurate as of July 01, 2022  3:58 PM. If you have any questions, ask your nurse or doctor.          STOP taking these medications    amLODipine 5 MG tablet Commonly known as: NORVASC Stopped by: Fransisca Kaufmann Lewayne Pauley, MD       TAKE these medications    Accu-Chek Aviva Plus test strip Generic drug: glucose blood TEST BLOOD DAILY AND AS NEEDED DX E11.22   albuterol 108 (90 Base) MCG/ACT inhaler Commonly known as: ProAir HFA Inhale 1-2 puffs into the lungs every 6 (six) hours as needed for wheezing or shortness of breath.   Amitiza 24 MCG capsule Generic drug: lubiprostone Take 24 mcg by mouth 2 (two) times daily.  ARIPiprazole 2 MG tablet Commonly known as: ABILIFY Take 1 tablet (2 mg total) by mouth daily.   baclofen 10 MG tablet Commonly known as: LIORESAL Take 10 mg by mouth 3 (three) times daily.   buPROPion 150 MG 24 hr tablet Commonly known as: WELLBUTRIN XL Take 1 tablet (150 mg total) by mouth daily.   cetirizine 10 MG tablet Commonly known as: ZYRTEC Take 1 tablet (10 mg total) by mouth daily. Started by: Worthy Rancher, MD   cyclobenzaprine 10 MG tablet Commonly known as: FLEXERIL Take 10 mg by mouth 3 (three) times daily as needed.   fenofibrate 160 MG tablet Take 1 tablet (160 mg total) by mouth daily.   finasteride 5 MG tablet Commonly known as: PROSCAR Take 1 tablet (5 mg total) by mouth daily. Started by: Fransisca Kaufmann Lenice Koper, MD   hydrOXYzine 25 MG capsule Commonly known as: VISTARIL Take 25-50 mg by mouth at bedtime as needed.   levothyroxine 175 MCG tablet Commonly known as: SYNTHROID Take 1 tablet (175 mcg total) by mouth daily.   methocarbamol 500 MG tablet Commonly known as: ROBAXIN Take 500 mg by mouth 3 (three) times daily as needed.   morphine 30 MG 12 hr tablet Commonly known as: MS CONTIN Take 30 mg by mouth every 8 (eight) hours.   oxyCODONE-acetaminophen 7.5-325 MG tablet Commonly known as: PERCOCET TAKE 1 TABLET BY MOUTH EVERY FOUR HOURS AS NEEDED FOR PAIN   pantoprazole 20 MG tablet Commonly known as: PROTONIX Take 1 tablet (20 mg total) by mouth daily.   pregabalin 75 MG capsule Commonly known as: LYRICA Take 75 mg by mouth 3 (three) times daily.   rizatriptan 10 MG tablet Commonly known as: MAXALT TAKE 1 TABLET BY MOUTH AS NEEDED FOR MIGRAINE. MAY REPEAT IN 2 HOURS IF NEEDED   rosuvastatin 10 MG tablet Commonly known as: CRESTOR Take 1 tablet (10 mg total) by mouth daily.   sertraline 100 MG tablet Commonly known as: ZOLOFT Take 1.5 tablets (150 mg total) by mouth daily.   Spiriva HandiHaler 18 MCG inhalation capsule Generic drug: tiotropium Place 1 capsule (18 mcg total) into inhaler and inhale daily.   tamsulosin 0.4 MG Caps capsule Commonly known as: FLOMAX Take 1 capsule (0.4 mg total) by mouth daily.   Vitamin D3 125 MCG (5000 UT) Caps Take 5,000 Units by mouth.   zolpidem 5 MG tablet Commonly known as: AMBIEN Take 1 tablet (5 mg total) by mouth at bedtime as needed for sleep.         Objective:   BP 126/78   Pulse (!)  103   Ht _0  (1.88 m)   Wt 229 lb (103.9 kg)   SpO2 97%   BMI 29.40 kg/m   Wt Readings from Last 3 Encounters:  07/01/22 229 lb (103.9 kg)  03/31/22 229 lb (103.9 kg)  03/03/22 230 lb (104.3 kg)    Physical Exam Vitals and nursing note reviewed.  Constitutional:      General: He is not in acute distress.    Appearance: He is well-developed. He is not diaphoretic.  Eyes:     General: No scleral icterus.    Conjunctiva/sclera: Conjunctivae normal.  Neck:     Thyroid: No thyromegaly.  Cardiovascular:     Rate and Rhythm: Normal rate and regular rhythm.     Heart sounds: Normal heart sounds. No murmur heard. Pulmonary:     Effort: Pulmonary effort is normal. No respiratory distress.  Breath sounds: Normal breath sounds. No wheezing.  Musculoskeletal:        General: No swelling. Normal range of motion.     Cervical back: Neck supple.  Lymphadenopathy:     Cervical: No cervical adenopathy.  Skin:    General: Skin is warm and dry.     Findings: No rash.  Neurological:     Mental Status: He is alert and oriented to person, place, and time.     Coordination: Coordination normal.  Psychiatric:        Behavior: Behavior normal.       Assessment & Plan:   Problem List Items Addressed This Visit       Cardiovascular and Mediastinum   Hypertension associated with diabetes ([place])   Relevant Medications   fenofibrate 160 MG tablet   rosuvastatin (CRESTOR) 10 MG tablet   Other Relevant Orders   TSH   Bayer DCA Hb A1c Waived   CBC with Differential/Platelet   CMP14+EGFR   Lipid panel     Endocrine   Hypothyroidism   Relevant Orders   TSH   Bayer DCA Hb A1c Waived   CBC with Differential/Platelet   CMP14+EGFR   Lipid panel   Diabetes mellitus with chronic kidney disease (HCC)   Relevant Medications   rosuvastatin (CRESTOR) 10 MG tablet   Other Relevant Orders   TSH   Bayer DCA Hb A1c Waived   CBC with Differential/Platelet   CMP14+EGFR   Lipid panel    Hyperlipidemia associated with type 2 diabetes mellitus (HCC) - Primary   Relevant Medications   fenofibrate 160 MG tablet   rosuvastatin (CRESTOR) 10 MG tablet   Other Relevant Orders   TSH   Bayer DCA Hb A1c Waived   CBC with Differential/Platelet   CMP14+EGFR   Lipid panel   Other Visit Diagnoses     Mixed hyperlipidemia       Relevant Medications   fenofibrate 160 MG tablet   rosuvastatin (CRESTOR) 10 MG tablet   Nasal congestion       Relevant Medications   cetirizine (ZYRTEC) 10 MG tablet   Benign prostatic hyperplasia with urinary hesitancy       Relevant Medications   finasteride (PROSCAR) 5 MG tablet   Other Relevant Orders   PSA, total and free     For prostate because not doing as well will add finasteride to his tamsulosin and see if that helps.  Continue off amlodipine because he seems to be doing better with his blood pressure.  Will add an allergy pill of Zyrtec to see if I can help with the Flonase and sinus and allergy symptoms.  If does not improve then will send to ENT.  No other changes  Follow up plan: Return in about 3 months (around 09/30/2022), or if symptoms worsen or fail to improve, for Diabetes recheck.  Counseling provided for all of the vaccine components Orders Placed This Encounter  Procedures   TSH   Bayer [place] Hb A1c Waived   CBC with Differential/Platelet   CMP14+EGFR   Lipid panel   PSA, total and free    Caryl Pina, MD [facility] 07/01/2022, 3:58 PM

## 2022-07-02 LAB — CBC WITH DIFFERENTIAL/PLATELET
Basophils Absolute: 0.1 10*3/uL (ref 0.0–0.2)
Basos: 1 %
EOS (ABSOLUTE): 0.2 10*3/uL (ref 0.0–0.4)
Eos: 4 %
Hematocrit: 38 % (ref 37.5–51.0)
Hemoglobin: 12.8 g/dL — ABNORMAL LOW (ref 13.0–17.7)
Immature Grans (Abs): 0 10*3/uL (ref 0.0–0.1)
Immature Granulocytes: 0 %
Lymphocytes Absolute: 1.7 10*3/uL (ref 0.7–3.1)
Lymphs: 37 %
MCH: 28.6 pg (ref 26.6–33.0)
MCHC: 33.7 g/dL (ref 31.5–35.7)
MCV: 85 fL (ref 79–97)
Monocytes Absolute: 0.4 10*3/uL (ref 0.1–0.9)
Monocytes: 8 %
Neutrophils Absolute: 2.2 10*3/uL (ref 1.4–7.0)
Neutrophils: 50 %
Platelets: 231 10*3/uL (ref 150–450)
RBC: 4.48 x10E6/uL (ref 4.14–5.80)
RDW: 12.4 % (ref 11.6–15.4)
WBC: 4.5 10*3/uL (ref 3.4–10.8)

## 2022-07-02 LAB — CMP14+EGFR
ALT: 20 IU/L (ref 0–44)
AST: 20 IU/L (ref 0–40)
Albumin/Globulin Ratio: 1.6 (ref 1.2–2.2)
Albumin: 4.1 g/dL (ref 3.9–4.9)
Alkaline Phosphatase: 31 IU/L — ABNORMAL LOW (ref 44–121)
BUN/Creatinine Ratio: 13 (ref 10–24)
BUN: 19 mg/dL (ref 8–27)
Bilirubin Total: <0.2 mg/dL (ref 0.0–1.2)
CO2: 23 mmol/L (ref 20–29)
Calcium: 9.5 mg/dL (ref 8.6–10.2)
Chloride: 102 mmol/L (ref 96–106)
Creatinine, Ser: 1.51 mg/dL — ABNORMAL HIGH (ref 0.76–1.27)
Globulin, Total: 2.5 g/dL (ref 1.5–4.5)
Glucose: 159 mg/dL — ABNORMAL HIGH (ref 70–99)
Potassium: 4.8 mmol/L (ref 3.5–5.2)
Sodium: 140 mmol/L (ref 134–144)
Total Protein: 6.6 g/dL (ref 6.0–8.5)
eGFR: 52 mL/min/{1.73_m2} — ABNORMAL LOW (ref >59)

## 2022-07-02 LAB — LIPID PANEL
Chol/HDL Ratio: 4.2 ratio (ref 0.0–5.0)
Cholesterol, Total: 152 mg/dL (ref 100–199)
HDL: 36 mg/dL — ABNORMAL LOW (ref >39)
LDL Chol Calc (NIH): 61 mg/dL (ref 0–99)
Triglycerides: 357 mg/dL — ABNORMAL HIGH (ref 0–149)
VLDL Cholesterol Cal: 55 mg/dL — ABNORMAL HIGH (ref 5–40)

## 2022-07-02 LAB — TSH: TSH: 0.019 u[IU]/mL — ABNORMAL LOW (ref 0.450–4.500)

## 2022-07-07 MED ORDER — LEVOTHYROXINE SODIUM 150 MCG PO TABS: 150.0000 ug | ORAL_TABLET | Freq: Every day | ORAL | 3 refills | Status: DC

## 2022-07-07 NOTE — Addendum Note (Signed)
Addended by: Gerilyn Nestle on: 07/07/2022 03:30 PM   Modules accepted: Orders

## 2022-07-13 DIAGNOSIS — K5900 Constipation, unspecified: Secondary | ICD-10-CM | POA: Diagnosis not present

## 2022-07-13 DIAGNOSIS — E1142 Type 2 diabetes mellitus with diabetic polyneuropathy: Secondary | ICD-10-CM | POA: Diagnosis not present

## 2022-07-13 DIAGNOSIS — M47817 Spondylosis without myelopathy or radiculopathy, lumbosacral region: Secondary | ICD-10-CM | POA: Diagnosis not present

## 2022-07-13 DIAGNOSIS — G894 Chronic pain syndrome: Secondary | ICD-10-CM | POA: Diagnosis not present

## 2022-07-15 ENCOUNTER — Encounter (HOSPITAL_COMMUNITY): Payer: Self-pay | Admitting: Psychiatry

## 2022-07-15 ENCOUNTER — Ambulatory Visit (INDEPENDENT_AMBULATORY_CARE_PROVIDER_SITE_OTHER): Payer: Medicare Other | Admitting: Psychiatry

## 2022-07-15 DIAGNOSIS — G4700 Insomnia, unspecified: Secondary | ICD-10-CM | POA: Insufficient documentation

## 2022-07-15 DIAGNOSIS — Z79899 Other long term (current) drug therapy: Secondary | ICD-10-CM | POA: Insufficient documentation

## 2022-07-15 DIAGNOSIS — F064 Anxiety disorder due to known physiological condition: Secondary | ICD-10-CM

## 2022-07-15 DIAGNOSIS — G894 Chronic pain syndrome: Secondary | ICD-10-CM

## 2022-07-15 DIAGNOSIS — F331 Major depressive disorder, recurrent, moderate: Secondary | ICD-10-CM | POA: Diagnosis not present

## 2022-07-15 DIAGNOSIS — Z8521 Personal history of malignant neoplasm of larynx: Secondary | ICD-10-CM | POA: Diagnosis not present

## 2022-07-15 DIAGNOSIS — G4709 Other insomnia: Secondary | ICD-10-CM

## 2022-07-15 DIAGNOSIS — F330 Major depressive disorder, recurrent, mild: Secondary | ICD-10-CM

## 2022-07-15 MED ORDER — SERTRALINE HCL 100 MG PO TABS: 100.0000 mg | ORAL_TABLET | Freq: Every day | ORAL | 0 refills | Status: DC

## 2022-07-15 MED ORDER — ARIPIPRAZOLE 2 MG PO TABS: 2.0000 mg | ORAL_TABLET | Freq: Every evening | ORAL | 0 refills | Status: DC

## 2022-07-15 MED ORDER — BUPROPION HCL ER (XL) 150 MG PO TB24: 150.0000 mg | ORAL_TABLET | Freq: Every day | ORAL | 0 refills | Status: DC
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
## 2022-07-15 NOTE — Patient Instructions (Signed)
We made several medication changes today.  We stopped her hydroxyzine since it was not working, we fully discontinue the Ambien since it is a little unsafe to use with the opiate medications, and we changed your bupropion from nighttime to the morning and this should make it a little bit easier to stay asleep when he fall asleep.  If not let me know after 1 week by calling the clinic or sending me MyChart message.  We will then potentially start the Remeron like we talked about and visit today.  I will also message your PCP to see about getting a ECG in the new year.

## 2022-07-21 ENCOUNTER — Telehealth (HOSPITAL_COMMUNITY): Payer: Self-pay | Admitting: *Deleted

## 2022-07-21 DIAGNOSIS — F064 Anxiety disorder due to known physiological condition: Secondary | ICD-10-CM

## 2022-07-21 DIAGNOSIS — F331 Major depressive disorder, recurrent, moderate: Secondary | ICD-10-CM

## 2022-07-21 DIAGNOSIS — G4709 Other insomnia: Secondary | ICD-10-CM

## 2022-07-21 NOTE — Telephone Encounter (Signed)
Patient called stating that the Zoloft is still not making him sleep. Per pt he stated that provider informed him that if it's not working to call the office and he'll put him on something else. Per pt he do not know what that something else is. Informed provider is out of the office. Patient verbalized understanding. Per pt he'll just have to wait until provider returns to office to get that medication he was suppose to put him on.

## 2022-07-23 NOTE — Telephone Encounter (Signed)
Pt called in he states that he is taking the bupropion in the morning. He Is wanting to know if rx can be sent in for him. Went over Dr [employer]. Please advise  Can you let him know I called in the Remeron 15 mg nightly?  That should begin to help with his sleep.

## 2022-07-26 MED ORDER — MIRTAZAPINE 15 MG PO TABS: 15.0000 mg | ORAL_TABLET | Freq: Every day | ORAL | 0 refills | Status: DC

## 2022-07-26 NOTE — Addendum Note (Signed)
Addended by: Debby Bud on: 07/26/2022 10:48 AM   Modules accepted: Orders

## 2022-07-26 NOTE — Telephone Encounter (Signed)
Called pt no answer left vm 

## 2022-07-27 NOTE — Telephone Encounter (Signed)
Called pt no answer left vm 

## 2022-07-29 NOTE — Telephone Encounter (Signed)
Pt called in he states that he did get his new rx

## 2022-07-29 NOTE — Telephone Encounter (Signed)
Called pt no answer left vm 

## 2022-08-08 ENCOUNTER — Other Ambulatory Visit: Payer: Self-pay | Admitting: Family Medicine

## 2022-08-08 DIAGNOSIS — G4489 Other headache syndrome: Secondary | ICD-10-CM

## 2022-08-12 ENCOUNTER — Ambulatory Visit: Payer: Medicare Other | Admitting: Psychiatry

## 2022-08-12 DIAGNOSIS — G894 Chronic pain syndrome: Secondary | ICD-10-CM | POA: Diagnosis not present

## 2022-08-12 DIAGNOSIS — M47817 Spondylosis without myelopathy or radiculopathy, lumbosacral region: Secondary | ICD-10-CM | POA: Diagnosis not present

## 2022-08-12 DIAGNOSIS — E1142 Type 2 diabetes mellitus with diabetic polyneuropathy: Secondary | ICD-10-CM | POA: Diagnosis not present

## 2022-08-12 DIAGNOSIS — K5900 Constipation, unspecified: Secondary | ICD-10-CM | POA: Diagnosis not present

## 2022-08-17 ENCOUNTER — Encounter (HOSPITAL_COMMUNITY): Payer: Self-pay | Admitting: Psychiatry

## 2022-08-17 ENCOUNTER — Telehealth (INDEPENDENT_AMBULATORY_CARE_PROVIDER_SITE_OTHER): Payer: Medicare Other | Admitting: Psychiatry

## 2022-08-17 DIAGNOSIS — G4709 Other insomnia: Secondary | ICD-10-CM | POA: Diagnosis not present

## 2022-08-17 DIAGNOSIS — F064 Anxiety disorder due to known physiological condition: Secondary | ICD-10-CM

## 2022-08-17 DIAGNOSIS — Z8521 Personal history of malignant neoplasm of larynx: Secondary | ICD-10-CM

## 2022-08-17 DIAGNOSIS — G894 Chronic pain syndrome: Secondary | ICD-10-CM

## 2022-08-17 DIAGNOSIS — F331 Major depressive disorder, recurrent, moderate: Secondary | ICD-10-CM
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
## 2022-08-17 NOTE — Patient Instructions (Signed)
We discontinued the bupropion today since it did not seem to be having much of an effect.  For the next week please start doing beginners low back yoga from YouTube videos and do not go past the point of pain or if you feel there is a too complex stretch avoid doing it for now.  This should make it a little bit easier to toss and turn less at night by having slightly more exercise during the day in addition to walking.  For sleep, if you wake up and then cannot fall back asleep within 5 or 10 minutes get out of the bed and do something boring in the low light setting and do not drink any food or water.  We want to rebuild the association of bed with something that you sleep or have sex on.  In 1 week let me know how these changes are going and then we will plan on increasing the Remeron next to 30 mg.  Over time we will likely discontinue your Zoloft.

## 2022-08-18 ENCOUNTER — Other Ambulatory Visit: Payer: Self-pay | Admitting: Family Medicine

## 2022-08-18 ENCOUNTER — Other Ambulatory Visit (HOSPITAL_COMMUNITY): Payer: Self-pay | Admitting: Psychiatry

## 2022-08-18 DIAGNOSIS — F331 Major depressive disorder, recurrent, moderate: Secondary | ICD-10-CM

## 2022-08-18 DIAGNOSIS — F064 Anxiety disorder due to known physiological condition: Secondary | ICD-10-CM

## 2022-08-18 DIAGNOSIS — G4709 Other insomnia: Secondary | ICD-10-CM

## 2022-08-24 ENCOUNTER — Other Ambulatory Visit (HOSPITAL_COMMUNITY): Payer: Self-pay | Admitting: Psychiatry

## 2022-08-24 ENCOUNTER — Encounter (HOSPITAL_COMMUNITY): Payer: Self-pay

## 2022-08-24 DIAGNOSIS — F331 Major depressive disorder, recurrent, moderate: Secondary | ICD-10-CM

## 2022-08-24 DIAGNOSIS — F064 Anxiety disorder due to known physiological condition: Secondary | ICD-10-CM

## 2022-08-24 DIAGNOSIS — G4709 Other insomnia: Secondary | ICD-10-CM

## 2022-08-24 MED ORDER — MIRTAZAPINE 30 MG PO TABS: 30.0000 mg | ORAL_TABLET | Freq: Every day | ORAL | 1 refills | Status: DC

## 2022-08-24 NOTE — Progress Notes (Signed)
Sent in mirtazapine '30mg'$  per last visit note with 1 week check in. Unfortunately yoga initial trial not successful leading to more pain. Will continue multimodal approach to mood as able.

## 2022-09-13 DIAGNOSIS — G894 Chronic pain syndrome: Secondary | ICD-10-CM | POA: Diagnosis not present

## 2022-09-13 DIAGNOSIS — K5900 Constipation, unspecified: Secondary | ICD-10-CM | POA: Diagnosis not present

## 2022-09-13 DIAGNOSIS — E1142 Type 2 diabetes mellitus with diabetic polyneuropathy: Secondary | ICD-10-CM | POA: Diagnosis not present

## 2022-09-13 DIAGNOSIS — M47817 Spondylosis without myelopathy or radiculopathy, lumbosacral region: Secondary | ICD-10-CM | POA: Diagnosis not present

## 2022-09-16 ENCOUNTER — Telehealth (INDEPENDENT_AMBULATORY_CARE_PROVIDER_SITE_OTHER): Payer: Medicare Other | Admitting: Psychiatry

## 2022-09-16 ENCOUNTER — Encounter (HOSPITAL_COMMUNITY): Payer: Self-pay | Admitting: Psychiatry

## 2022-09-16 ENCOUNTER — Other Ambulatory Visit (HOSPITAL_COMMUNITY): Payer: Self-pay | Admitting: Psychiatry

## 2022-09-16 DIAGNOSIS — G894 Chronic pain syndrome: Secondary | ICD-10-CM | POA: Diagnosis not present

## 2022-09-16 DIAGNOSIS — G4709 Other insomnia: Secondary | ICD-10-CM

## 2022-09-16 DIAGNOSIS — F064 Anxiety disorder due to known physiological condition: Secondary | ICD-10-CM | POA: Diagnosis not present

## 2022-09-16 DIAGNOSIS — F331 Major depressive disorder, recurrent, moderate: Secondary | ICD-10-CM | POA: Diagnosis not present

## 2022-09-16 DIAGNOSIS — Z79899 Other long term (current) drug therapy: Secondary | ICD-10-CM | POA: Diagnosis not present

## 2022-09-16 DIAGNOSIS — G4739 Other sleep apnea: Secondary | ICD-10-CM

## 2022-09-16 MED ORDER — MIRTAZAPINE 45 MG PO TABS: 45.0000 mg | ORAL_TABLET | Freq: Every day | ORAL | 2 refills | Status: DC

## 2022-09-16 NOTE — Patient Instructions (Signed)
We increased the Remeron to 45 mg nightly today.  In 2 weeks start on the Zoloft in half and when we meet again in 1 month we will plan on discontinuing it fully.  One option that we discussed is potentially trying to go to bed closer to 11 PM to shift when you would fully wake up the next day.  When we meet next we will also likely discuss other medication options to work towards the goal of less back pain and being able to fish again.  If you get a chance try to coordinate with your primary care provider to get an updated EKG while you are still taking the antipsychotic class of medications Abilify.
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
## 2022-09-20 ENCOUNTER — Other Ambulatory Visit: Payer: Self-pay | Admitting: Psychiatry

## 2022-09-20 DIAGNOSIS — F331 Major depressive disorder, recurrent, moderate: Secondary | ICD-10-CM

## 2022-09-30 ENCOUNTER — Ambulatory Visit (INDEPENDENT_AMBULATORY_CARE_PROVIDER_SITE_OTHER): Payer: Medicare Other | Admitting: Family Medicine

## 2022-09-30 ENCOUNTER — Encounter: Payer: Self-pay | Admitting: Family Medicine

## 2022-09-30 VITALS — BP 135/84 | HR 77 | Ht 74.0 in | Wt 238.0 lb

## 2022-09-30 DIAGNOSIS — E039 Hypothyroidism, unspecified: Secondary | ICD-10-CM

## 2022-09-30 DIAGNOSIS — J439 Emphysema, unspecified: Secondary | ICD-10-CM | POA: Diagnosis not present

## 2022-09-30 DIAGNOSIS — E785 Hyperlipidemia, unspecified: Secondary | ICD-10-CM | POA: Diagnosis not present

## 2022-09-30 DIAGNOSIS — E1159 Type 2 diabetes mellitus with other circulatory complications: Secondary | ICD-10-CM

## 2022-09-30 DIAGNOSIS — E1169 Type 2 diabetes mellitus with other specified complication: Secondary | ICD-10-CM | POA: Diagnosis not present

## 2022-09-30 DIAGNOSIS — E0822 Diabetes mellitus due to underlying condition with diabetic chronic kidney disease: Secondary | ICD-10-CM | POA: Diagnosis not present

## 2022-09-30 DIAGNOSIS — K219 Gastro-esophageal reflux disease without esophagitis: Secondary | ICD-10-CM | POA: Diagnosis not present

## 2022-09-30 DIAGNOSIS — Z794 Long term (current) use of insulin: Secondary | ICD-10-CM | POA: Diagnosis not present

## 2022-09-30 DIAGNOSIS — E1122 Type 2 diabetes mellitus with diabetic chronic kidney disease: Secondary | ICD-10-CM | POA: Diagnosis not present

## 2022-09-30 DIAGNOSIS — I152 Hypertension secondary to endocrine disorders: Secondary | ICD-10-CM | POA: Diagnosis not present

## 2022-09-30 DIAGNOSIS — N1831 Chronic kidney disease, stage 3a: Secondary | ICD-10-CM

## 2022-09-30 LAB — BAYER DCA HB A1C WAIVED: HB A1C (BAYER DCA - WAIVED): 6.7 % — ABNORMAL HIGH (ref 4.8–5.6)

## 2022-09-30 MED ORDER — PANTOPRAZOLE SODIUM 20 MG PO TBEC: 20.0000 mg | DELAYED_RELEASE_TABLET | Freq: Every day | ORAL | 3 refills | Status: DC

## 2022-09-30 MED ORDER — TIOTROPIUM BROMIDE MONOHYDRATE 18 MCG IN CAPS: 18.0000 ug | ORAL_CAPSULE | Freq: Every day | RESPIRATORY_TRACT | 3 refills | Status: DC

## 2022-09-30 NOTE — Progress Notes (Signed)
BP 135/84   Pulse 77   Ht '6\' 2"'$  (1.88 m)   Wt 238 lb (108 kg)   SpO2 94%   BMI 30.56 kg/m    Subjective:   Patient ID: Joseph [name], male    DOB: 14-Mar-1961, 62 y.o.   MRN: [id number]  HPI: Joseph [name] is a 62 y.o. male presenting on 09/30/2022 for Medical Management of Chronic Issues, Hyperlipidemia, Hypothyroidism, and Diabetes   HPI Type 2 diabetes mellitus Patient comes in today for recheck of his diabetes. Patient has been currently taking no medicine currently, diet control. Patient is not currently on an ACE inhibitor/ARB. Patient has not seen an ophthalmologist this year. Patient denies any issues with their feet. The symptom started onset as an adult hypertension and hyperlipidemia and hypothyroidism ARE RELATED TO DM   Hypothyroidism recheck Patient is coming in for thyroid recheck today as well. They deny any issues with hair changes or heat or cold problems or diarrhea or constipation. They deny any chest pain or palpitations. They are currently on levothyroxine 150 micrograms   Hypertension Patient is currently on no medicine currently, diet control, and their blood pressure today is 135/84. Patient denies any lightheadedness or dizziness. Patient denies headaches, blurred vision, chest pains, shortness of breath, or weakness. Denies any side effects from medication and is content with current medication.   Hyperlipidemia Patient is coming in for recheck of his hyperlipidemia. The patient is currently taking Crestor. They deny any issues with myalgias or history of liver damage from it. They deny any focal numbness or weakness or chest pain.   Relevant past medical, surgical, family and social history reviewed and updated as indicated. Interim medical history since our last visit reviewed. Allergies and medications reviewed and updated.  Review of Systems  Constitutional:  Negative for chills and fever.  Eyes:  Negative for discharge.  Respiratory:   Negative for shortness of breath and wheezing.   Cardiovascular:  Negative for chest pain and leg swelling.  Musculoskeletal:  Negative for back pain and gait problem.  Skin:  Negative for rash.  All other systems reviewed and are negative.   Per HPI unless specifically indicated above   Allergies as of 09/30/2022   No Known Allergies      Medication List        Accurate as of September 30, 2022  4:06 PM. If you have any questions, ask your nurse or doctor.          Accu-Chek Aviva Plus test strip Generic drug: glucose blood TEST BLOOD DAILY AND AS NEEDED DX E11.22   albuterol 108 (90 Base) MCG/ACT inhaler Commonly known as: ProAir HFA Inhale 1-2 puffs into the lungs every 6 (six) hours as needed for wheezing or shortness of breath.   Amitiza 24 MCG capsule Generic drug: lubiprostone Take 24 mcg by mouth 2 (two) times daily.   ARIPiprazole 2 MG tablet Commonly known as: ABILIFY Take 1 tablet (2 mg total) by mouth at bedtime.   cetirizine 10 MG tablet Commonly known as: ZYRTEC Take 1 tablet (10 mg total) by mouth daily.   fenofibrate 160 MG tablet Take 1 tablet (160 mg total) by mouth daily.   finasteride 5 MG tablet Commonly known as: PROSCAR Take 1 tablet (5 mg total) by mouth daily.   levothyroxine 150 MCG tablet Commonly known as: SYNTHROID Take 1 tablet (150 mcg total) by mouth daily.   mirtazapine 45 MG tablet Commonly known as: REMERON Take 1 tablet (  45 mg total) by mouth at bedtime.   morphine 30 MG 12 hr tablet Commonly known as: MS CONTIN Take 30 mg by mouth every 8 (eight) hours.   oxyCODONE-acetaminophen 7.5-325 MG tablet Commonly known as: PERCOCET TAKE 1 TABLET BY MOUTH EVERY FOUR HOURS AS NEEDED FOR PAIN   pantoprazole 20 MG tablet Commonly known as: PROTONIX Take 1 tablet (20 mg total) by mouth daily.   rizatriptan 10 MG tablet Commonly known as: MAXALT TAKE 1 TABLET BY MOUTH AS NEEDED FOR MIGRAINE. MAY REPEAT IN 2 HOURS IF NEEDED    rosuvastatin 10 MG tablet Commonly known as: CRESTOR Take 1 tablet (10 mg total) by mouth daily.   sertraline 100 MG tablet Commonly known as: ZOLOFT Take 1 tablet (100 mg total) by mouth daily.   tamsulosin 0.4 MG Caps capsule Commonly known as: FLOMAX Take 1 capsule (0.4 mg total) by mouth daily.   tiotropium 18 MCG inhalation capsule Commonly known as: Spiriva HandiHaler Place 1 capsule (18 mcg total) into inhaler and inhale daily.   Vitamin D3 125 MCG (5000 UT) capsule Generic drug: Cholecalciferol Take 5,000 Units by mouth.         Objective:   BP 135/84   Pulse 77   Ht '6\' 2"'$  (1.88 m)   Wt 238 lb (108 kg)   SpO2 94%   BMI 30.56 kg/m   Wt Readings from Last 3 Encounters:  09/30/22 238 lb (108 kg)  07/01/22 229 lb (103.9 kg)  03/31/22 229 lb (103.9 kg)    Physical Exam Vitals and nursing note reviewed.  Constitutional:      General: He is not in acute distress.    Appearance: He is well-developed. He is not diaphoretic.  Eyes:     General: No scleral icterus.       Right eye: No discharge.     Conjunctiva/sclera: Conjunctivae normal.  Neck:     Thyroid: No thyromegaly.  Cardiovascular:     Rate and Rhythm: Normal rate and regular rhythm.     Heart sounds: Normal heart sounds. No murmur heard. Pulmonary:     Effort: Pulmonary effort is normal. No respiratory distress.     Breath sounds: Normal breath sounds. No wheezing.  Musculoskeletal:        General: Normal range of motion.     Cervical back: Neck supple.  Lymphadenopathy:     Cervical: No cervical adenopathy.  Skin:    General: Skin is warm and dry.     Findings: No rash.  Neurological:     Mental Status: He is alert and oriented to person, place, and time.     Coordination: Coordination normal.  Psychiatric:        Behavior: Behavior normal.       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  No changes.  Looks like blood pressure is good as well. Follow up plan: Return in about 3 months (around 12/31/2022), or if symptoms worsen or fail to improve, for Diabetes and hypothyroidism and hypertension.  Counseling provided for all of the vaccine components Orders Placed This Encounter  Procedures   Bayer DCA Hb A1c Waived   Microalbumin / creatinine urine ratio    Caryl Pina, MD [facility] 09/30/2022, 4:06 PM

## 2022-10-01 ENCOUNTER — Other Ambulatory Visit: Payer: Self-pay | Admitting: Family Medicine

## 2022-10-01 DIAGNOSIS — J439 Emphysema, unspecified: Secondary | ICD-10-CM

## 2022-10-01 LAB — MICROALBUMIN / CREATININE URINE RATIO
Creatinine, Urine: 226.8 mg/dL
Microalb/Creat Ratio: <1 mg/g creat (ref 0–29)
Microalbumin, Urine: <3 ug/mL

## 2022-10-01 MED ORDER — SPIRIVA RESPIMAT 2.5 MCG/ACT IN AERS: 2.0000 | INHALATION_SPRAY | Freq: Every day | RESPIRATORY_TRACT | 5 refills | Status: DC

## 2022-10-01 NOTE — Telephone Encounter (Signed)
Sent Spiriva Respimat for the patient, sometimes that is covered better now than the Spiriva HandiHaler, it is the same medicine just a different delivery device.

## 2022-10-09 ENCOUNTER — Other Ambulatory Visit (HOSPITAL_COMMUNITY): Payer: Self-pay | Admitting: Psychiatry

## 2022-10-09 DIAGNOSIS — G4709 Other insomnia: Secondary | ICD-10-CM

## 2022-10-09 DIAGNOSIS — F331 Major depressive disorder, recurrent, moderate: Secondary | ICD-10-CM

## 2022-10-09 DIAGNOSIS — F064 Anxiety disorder due to known physiological condition: Secondary | ICD-10-CM

## 2022-10-11 DIAGNOSIS — K5900 Constipation, unspecified: Secondary | ICD-10-CM | POA: Diagnosis not present

## 2022-10-11 DIAGNOSIS — G894 Chronic pain syndrome: Secondary | ICD-10-CM | POA: Diagnosis not present

## 2022-10-11 DIAGNOSIS — M47817 Spondylosis without myelopathy or radiculopathy, lumbosacral region: Secondary | ICD-10-CM | POA: Diagnosis not present

## 2022-10-11 DIAGNOSIS — E1142 Type 2 diabetes mellitus with diabetic polyneuropathy: Secondary | ICD-10-CM | POA: Diagnosis not present

## 2022-10-18 ENCOUNTER — Telehealth (INDEPENDENT_AMBULATORY_CARE_PROVIDER_SITE_OTHER): Payer: Medicare Other | Admitting: Psychiatry

## 2022-10-18 ENCOUNTER — Encounter (HOSPITAL_COMMUNITY): Payer: Self-pay | Admitting: Psychiatry

## 2022-10-18 DIAGNOSIS — G894 Chronic pain syndrome: Secondary | ICD-10-CM

## 2022-10-18 DIAGNOSIS — Z8521 Personal history of malignant neoplasm of larynx: Secondary | ICD-10-CM

## 2022-10-18 DIAGNOSIS — G4739 Other sleep apnea: Secondary | ICD-10-CM | POA: Diagnosis not present

## 2022-10-18 DIAGNOSIS — F064 Anxiety disorder due to known physiological condition: Secondary | ICD-10-CM

## 2022-10-18 DIAGNOSIS — Z79899 Other long term (current) drug therapy: Secondary | ICD-10-CM | POA: Diagnosis not present

## 2022-10-18 DIAGNOSIS — G4709 Other insomnia: Secondary | ICD-10-CM

## 2022-10-18 DIAGNOSIS — F331 Major depressive disorder, recurrent, moderate: Secondary | ICD-10-CM

## 2022-10-18 MED ORDER — MIRTAZAPINE 45 MG PO TABS: 45.0000 mg | ORAL_TABLET | Freq: Every day | ORAL | 1 refills | Status: DC

## 2022-10-18 MED ORDER — SERTRALINE HCL 100 MG PO TABS: 100.0000 mg | ORAL_TABLET | Freq: Every day | ORAL | 1 refills | Status: DC

## 2022-10-18 MED ORDER — ARIPIPRAZOLE 2 MG PO TABS: 2.0000 mg | ORAL_TABLET | Freq: Every evening | ORAL | 1 refills | Status: DC
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
## 2022-10-18 NOTE — Patient Instructions (Signed)
We did not make any medication changes today since you were finally sleeping better with the 45 mg of Remeron.  I will message your PCP to get an updated A1c and EKG, if you do not hear from them please give them a call to follow-up on this.

## 2022-11-04 ENCOUNTER — Other Ambulatory Visit: Payer: Self-pay | Admitting: Psychiatry

## 2022-11-04 DIAGNOSIS — F331 Major depressive disorder, recurrent, moderate: Secondary | ICD-10-CM

## 2022-11-06 ENCOUNTER — Other Ambulatory Visit (HOSPITAL_COMMUNITY): Payer: Self-pay | Admitting: Psychiatry

## 2022-11-06 DIAGNOSIS — F330 Major depressive disorder, recurrent, mild: Secondary | ICD-10-CM

## 2022-11-10 DIAGNOSIS — K5900 Constipation, unspecified: Secondary | ICD-10-CM | POA: Diagnosis not present

## 2022-11-10 DIAGNOSIS — E1142 Type 2 diabetes mellitus with diabetic polyneuropathy: Secondary | ICD-10-CM | POA: Diagnosis not present

## 2022-11-10 DIAGNOSIS — M47817 Spondylosis without myelopathy or radiculopathy, lumbosacral region: Secondary | ICD-10-CM | POA: Diagnosis not present

## 2022-11-10 DIAGNOSIS — G894 Chronic pain syndrome: Secondary | ICD-10-CM | POA: Diagnosis not present

## 2022-12-09 DIAGNOSIS — M47817 Spondylosis without myelopathy or radiculopathy, lumbosacral region: Secondary | ICD-10-CM | POA: Diagnosis not present

## 2022-12-09 DIAGNOSIS — E1142 Type 2 diabetes mellitus with diabetic polyneuropathy: Secondary | ICD-10-CM | POA: Diagnosis not present

## 2022-12-09 DIAGNOSIS — K5900 Constipation, unspecified: Secondary | ICD-10-CM | POA: Diagnosis not present

## 2022-12-09 DIAGNOSIS — G894 Chronic pain syndrome: Secondary | ICD-10-CM | POA: Diagnosis not present

## 2022-12-10 IMAGING — CT CT ANGIO CHEST-ABD-PELV FOR DISSECTION W/ AND WO/W CM
2 of 7 series · 13 of 46 positions shown, 15 images · IV contrast (Omnipaque or Isovue)
Comparison: CT chest, abdomen, pelvis 05/29/2007 report without
imaging

CLINICAL DATA: Left sided chest, back and flank pain x 1 week.

EXAM:
CT ANGIOGRAPHY CHEST, ABDOMEN AND PELVIS
TECHNIQUE: Non-contrast CT of the chest was initially obtained.

[Series 5: axial arterial · axial · arterial · 0.85mm/px · z∈[+652,+1279]mm · 10 of 239 slices shown, 12 images]
[im 15/239  soft-tissue]
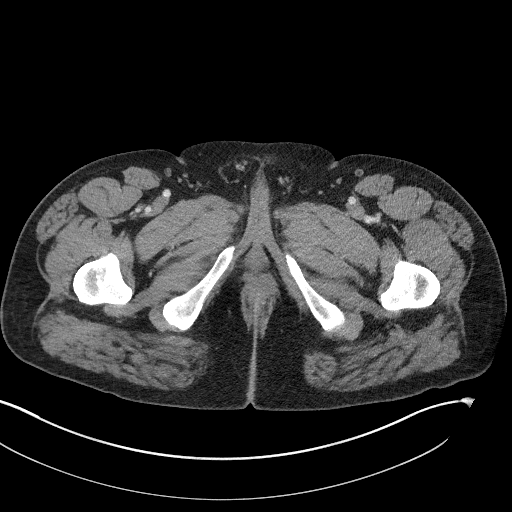
[im 15/239  bone]
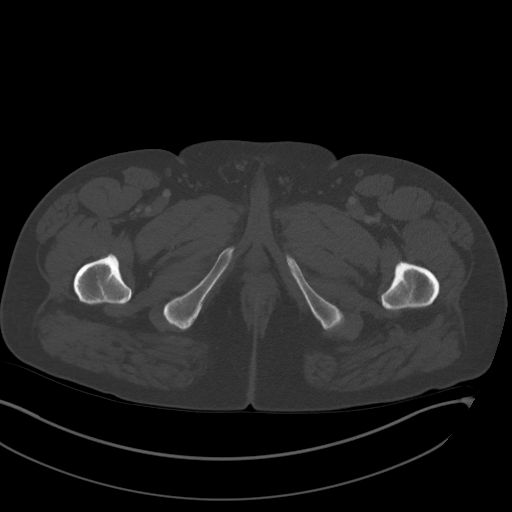
[im 45/239  soft-tissue]
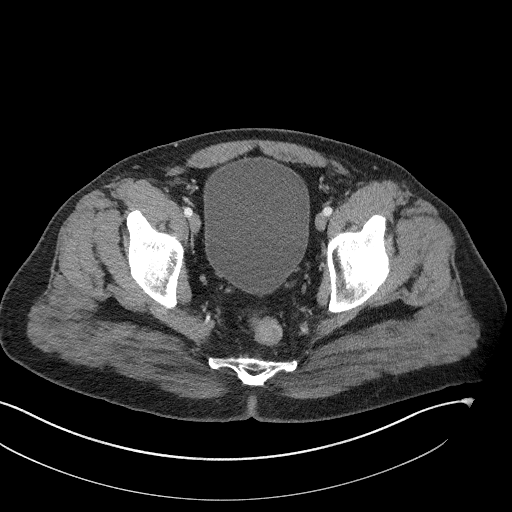
[im 60/239  soft-tissue]
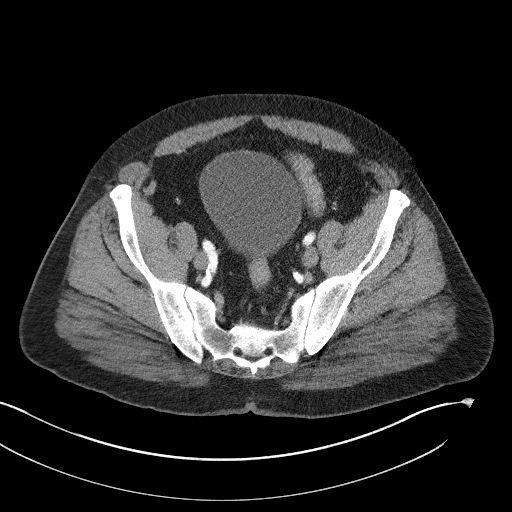
[im 90/239  soft-tissue]
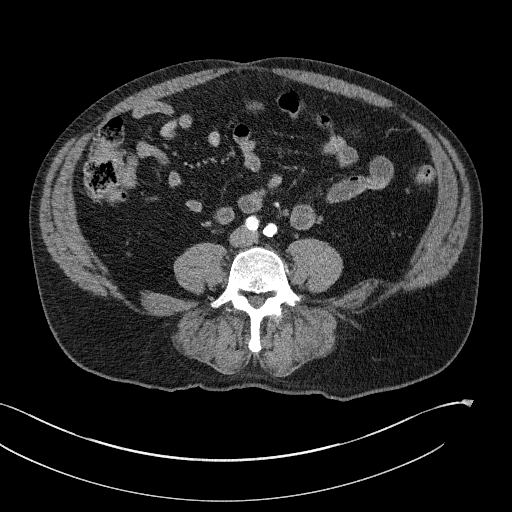
[im 105/239  soft-tissue]
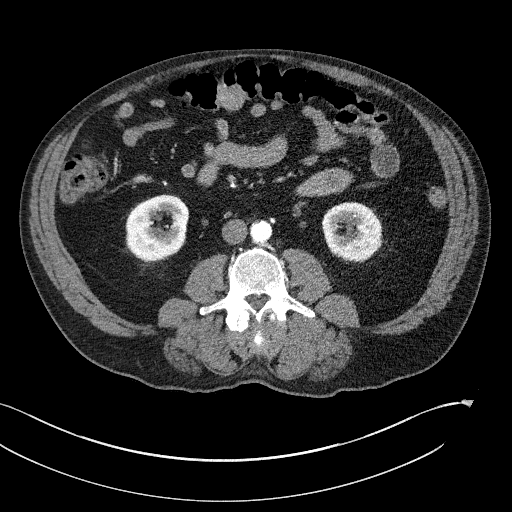
[im 134/239  soft-tissue]
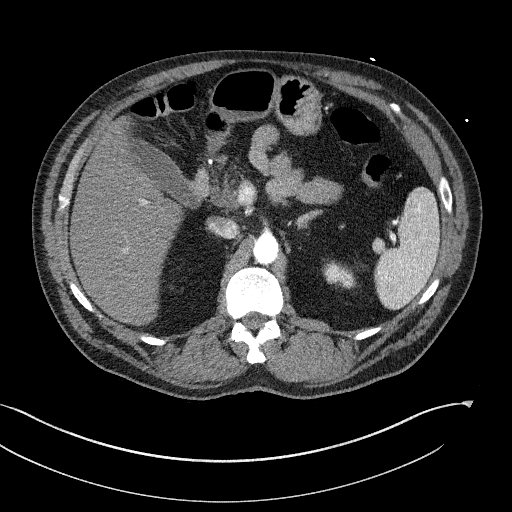
[im 149/239  soft-tissue]
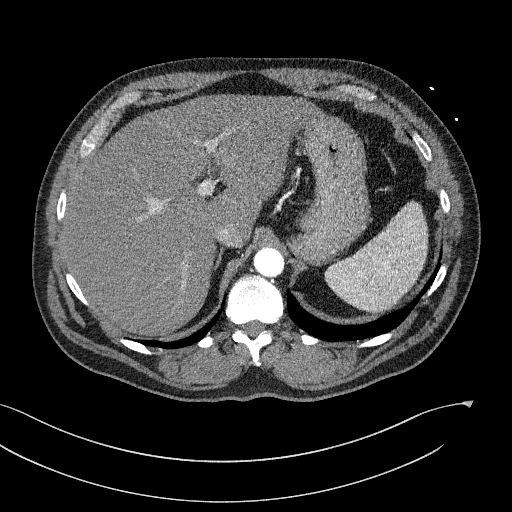
[im 179/239  soft-tissue]
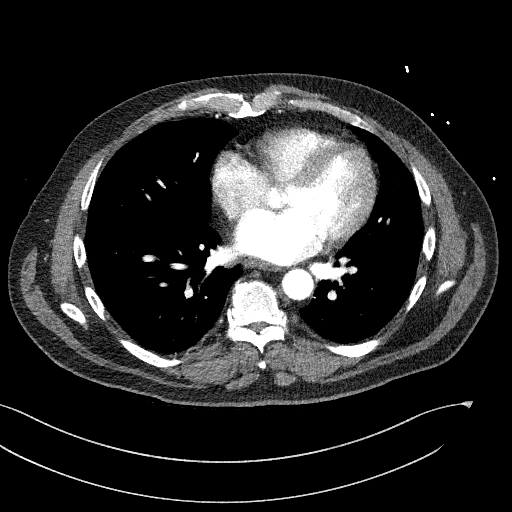
[im 194/239  soft-tissue]
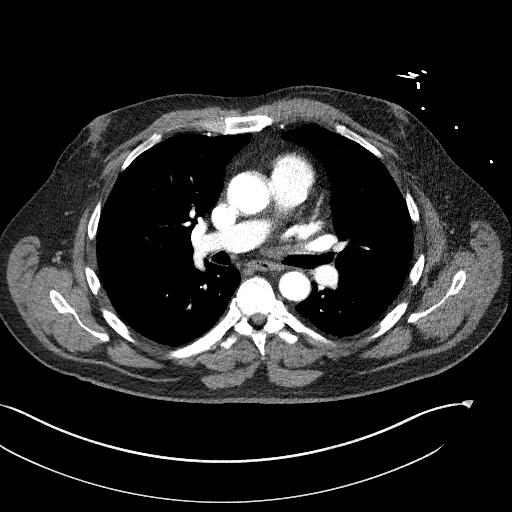
[im 194/239  bone]
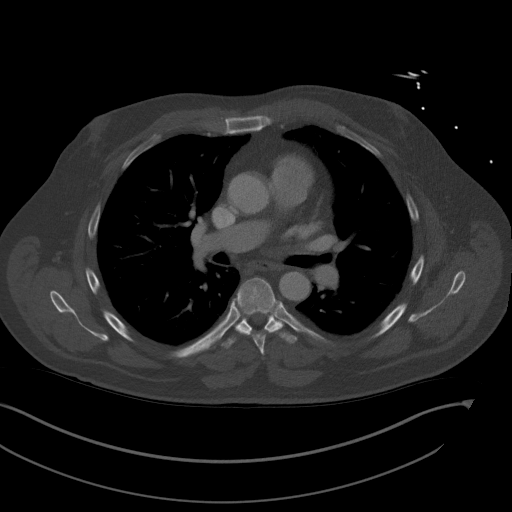
[im 224/239  soft-tissue]
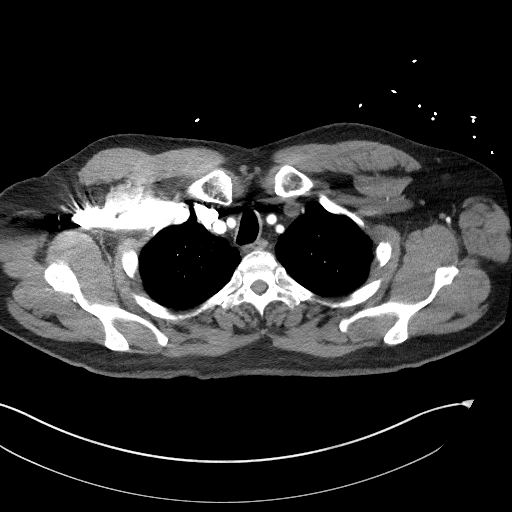

[Series 9: cor soft · coronal · 0.96mm/px · 3 of 163 slices shown]
[im 41/163  soft-tissue]
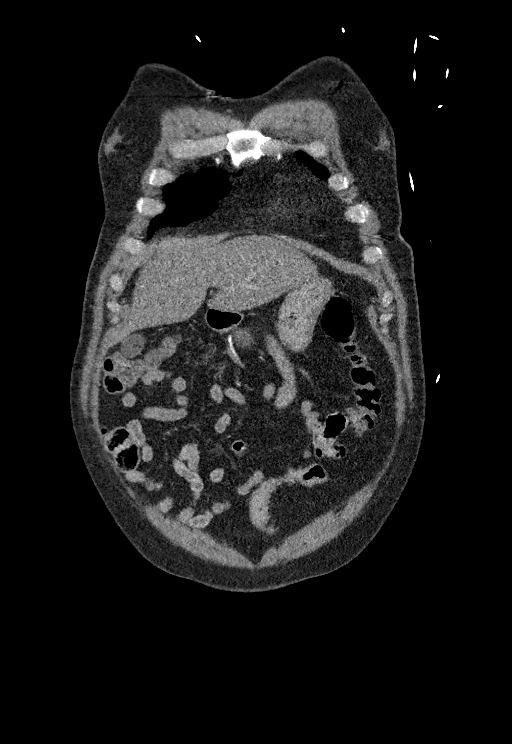
[im 82/163  soft-tissue]
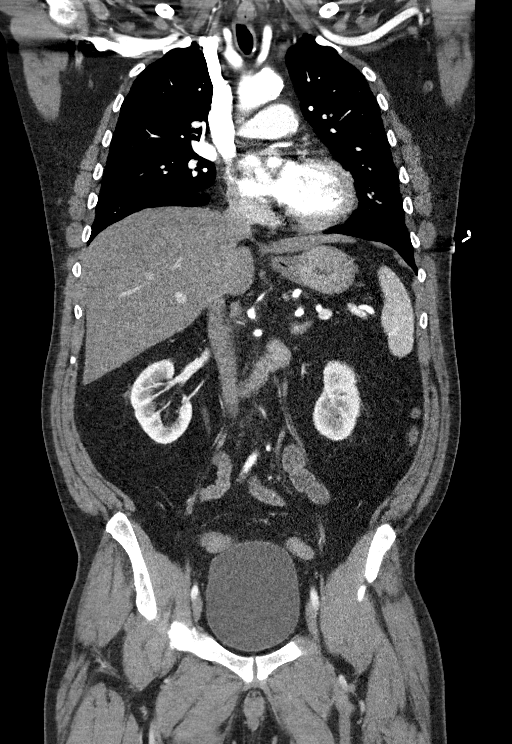
[im 122/163  soft-tissue]
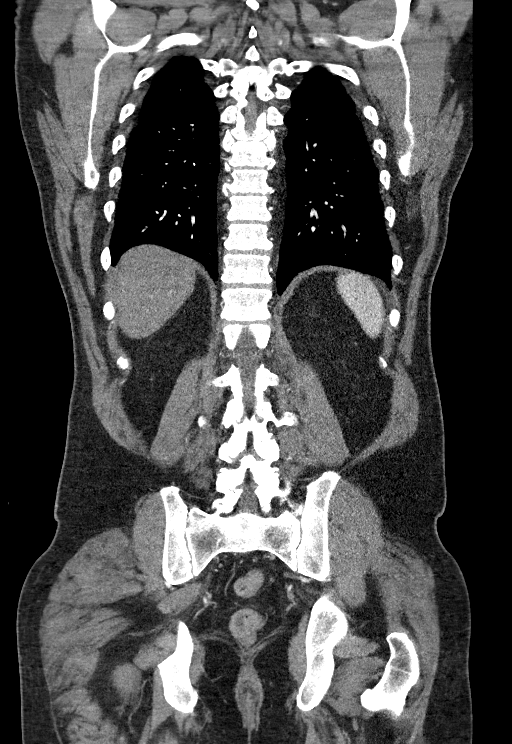

[13 of 46 positions shown; findings below may reference images not displayed]

Multidetector CT imaging through the chest, abdomen and pelvis was
performed using the standard protocol during bolus administration of
intravenous contrast. Multiplanar reconstructed images and MIPs were
obtained and reviewed to evaluate the vascular anatomy.

CONTRAST:  100mL OMNIPAQUE IOHEXOL 350 MG/ML SOLN
FINDINGS: VASCULAR

Aorta: Mild thoracic and mild to moderate abdominal aorta
atherosclerotic plaque. Normal caliber aorta without aneurysm,
dissection, vasculitis or significant stenosis.

Celiac: Patent without evidence of aneurysm, dissection, vasculitis
or significant stenosis.

SMA: Patent without evidence of aneurysm, dissection, vasculitis or
significant stenosis.

Renals: There are 2 right renal arteries and a single left renal
artery. Both renal arteries are patent without evidence of aneurysm,
dissection, vasculitis, fibromuscular dysplasia or significant
stenosis.

IMA: Patent without evidence of aneurysm, dissection, vasculitis or
significant stenosis.

Inflow: Patent without evidence of aneurysm, dissection, vasculitis
or significant stenosis.

Veins: No obvious venous abnormality within the limitations of this
arterial phase study.

Review of the MIP images confirms the above findings.

NON-VASCULAR

CTA CHEST FINDINGS

Cardiovascular: Preferential opacification of the thoracic aorta.
Normal heart size. No significant pericardial effusion. No coronary
artery calcifications. The main pulmonary artery is normal in
caliber. No central or segmental pulmonary embolus.

Mediastinum/Nodes: No enlarged mediastinal, hilar, or axillary lymph
nodes. Thyroid gland, trachea, and esophagus demonstrate no
significant findings.

Lungs/Pleura: Moderate paraseptal and centrilobular emphysematous
changes. No focal consolidation. Few scattered subpleural
micronodules. There is a 7 mm right middle lobe nodule ([DATE]).
Calcified nodule at the right lower lobe ([DATE]). Similarly
calcified nodule at the left lower base ([DATE]). No pulmonary mass.
No pleural effusion. No pneumothorax.

Musculoskeletal:

Bilateral gynecomastia.

No suspicious lytic or blastic osseous lesions. No acute displaced
fracture.

CTA ABDOMEN AND PELVIS FINDINGS

Hepatobiliary: No focal liver abnormality. No gallstones,
gallbladder wall thickening, or pericholecystic fluid. No biliary
dilatation.

Pancreas: Diffusely atrophic. No focal lesion. Otherwise normal
pancreatic contour. No surrounding inflammatory changes. No main
pancreatic ductal dilatation.

Spleen: The spleen measures at the upper limits of normal. A
splenule is identified. Otherwise no splenic lesion identified.

Adrenals/Urinary Tract: No definite left adrenal gland nodule. There
is a 1.8 cm right adrenal gland nodule with a density of 3
Hounsfield units on noncontrast. Bilateral kidneys enhance
symmetrically. No nephroureterolithiasis. No hydronephrosis. No
hydroureter. The urinary bladder is unremarkable.

Stomach/Bowel: Stomach is within normal limits. No evidence of bowel
wall thickening or dilatation. Couple of proximal transverse colon
diverticula. Appendix appears normal.

Lymphatic: No lymphadenopathy.

Reproductive: Prostate is unremarkable.

Other: No intraperitoneal free fluid. No intraperitoneal free gas.
No organized fluid collection.

Musculoskeletal:

No abdominal wall hernia or abnormality

No suspicious lytic or blastic osseous lesions. No acute displaced
fracture.

Review of the MIP images confirms the above findings.
IMPRESSION: 1. No aortic aneurysm or dissection.
2. No central or segmental pulmonary embolus.
3. No acute intrathoracic, intra-abdominal, intrapelvic abnormality.
4. Couple scattered pulmonary micronodules as well as a 7 mm right
middle lobe nodule.
Non-contrast chest CT at 3-6 months is recommended. If the nodules
are stable at time of repeat CT, then future CT at 18-24 months
(from today's scan) is considered optional for low-risk patients,
but is recommended for high-risk patients. This recommendation
follows the consensus statement: Guidelines for Management of
Incidental Pulmonary Nodules Detected on CT Images: From the
5. Aortic Atherosclerosis (F83OW-9GG.G) and Emphysema (F83OW-R6S.O).
6. Couple of scattered transverse colon diverticula with no acute
diverticulitis.

## 2022-12-31 ENCOUNTER — Encounter: Payer: Self-pay | Admitting: Family Medicine

## 2022-12-31 ENCOUNTER — Ambulatory Visit (INDEPENDENT_AMBULATORY_CARE_PROVIDER_SITE_OTHER): Payer: Medicare Other | Admitting: Family Medicine

## 2022-12-31 VITALS — BP 124/68 | HR 87 | Ht 74.0 in | Wt 235.0 lb

## 2022-12-31 DIAGNOSIS — Z794 Long term (current) use of insulin: Secondary | ICD-10-CM

## 2022-12-31 DIAGNOSIS — E1159 Type 2 diabetes mellitus with other circulatory complications: Secondary | ICD-10-CM | POA: Diagnosis not present

## 2022-12-31 DIAGNOSIS — E1169 Type 2 diabetes mellitus with other specified complication: Secondary | ICD-10-CM

## 2022-12-31 DIAGNOSIS — L2089 Other atopic dermatitis: Secondary | ICD-10-CM | POA: Diagnosis not present

## 2022-12-31 DIAGNOSIS — N401 Enlarged prostate with lower urinary tract symptoms: Secondary | ICD-10-CM | POA: Diagnosis not present

## 2022-12-31 DIAGNOSIS — E1122 Type 2 diabetes mellitus with diabetic chronic kidney disease: Secondary | ICD-10-CM | POA: Diagnosis not present

## 2022-12-31 DIAGNOSIS — E785 Hyperlipidemia, unspecified: Secondary | ICD-10-CM | POA: Diagnosis not present

## 2022-12-31 DIAGNOSIS — N183 Chronic kidney disease, stage 3 unspecified: Secondary | ICD-10-CM

## 2022-12-31 DIAGNOSIS — J449 Chronic obstructive pulmonary disease, unspecified: Secondary | ICD-10-CM

## 2022-12-31 DIAGNOSIS — E039 Hypothyroidism, unspecified: Secondary | ICD-10-CM | POA: Diagnosis not present

## 2022-12-31 DIAGNOSIS — I152 Hypertension secondary to endocrine disorders: Secondary | ICD-10-CM | POA: Diagnosis not present

## 2022-12-31 DIAGNOSIS — R3911 Hesitancy of micturition: Secondary | ICD-10-CM | POA: Diagnosis not present

## 2022-12-31 DIAGNOSIS — J439 Emphysema, unspecified: Secondary | ICD-10-CM

## 2022-12-31 LAB — BAYER DCA HB A1C WAIVED: HB A1C (BAYER DCA - WAIVED): 7.8 % — ABNORMAL HIGH (ref 4.8–5.6)

## 2022-12-31 MED ORDER — TAMSULOSIN HCL 0.4 MG PO CAPS: 0.4000 mg | ORAL_CAPSULE | Freq: Every day | ORAL | 3 refills | Status: DC

## 2022-12-31 MED ORDER — ALBUTEROL SULFATE HFA 108 (90 BASE) MCG/ACT IN AERS
1.0000 | INHALATION_SPRAY | Freq: Four times a day (QID) | RESPIRATORY_TRACT | 3 refills | Status: DC | PRN
Start: 2022-12-31 — End: 2023-05-31

## 2022-12-31 MED ORDER — TRIAMCINOLONE ACETONIDE 0.1 % EX CREA
1.0000 | TOPICAL_CREAM | Freq: Two times a day (BID) | CUTANEOUS | 3 refills | Status: DC
Start: 2022-12-31 — End: 2023-07-26

## 2022-12-31 MED ORDER — SPIRIVA RESPIMAT 1.25 MCG/ACT IN AERS: 2.0000 | INHALATION_SPRAY | Freq: Every day | RESPIRATORY_TRACT | 0 refills | Status: DC

## 2022-12-31 MED ORDER — ACCU-CHEK AVIVA PLUS VI STRP: ORAL_STRIP | 3 refills | Status: DC

## 2022-12-31 MED ORDER — SPIRIVA RESPIMAT 2.5 MCG/ACT IN AERS
2.0000 | INHALATION_SPRAY | Freq: Every day | RESPIRATORY_TRACT | 5 refills | Status: AC
Start: 2022-12-31 — End: ?

## 2022-12-31 NOTE — Addendum Note (Signed)
Addended by: Dorene Sorrow on: 12/31/2022 03:37 PM   Modules accepted: Orders

## 2022-12-31 NOTE — Progress Notes (Addendum)
BP 124/68   Pulse 87   Ht 6\' 2"  (1.88 m)   Wt 235 lb (106.6 kg)   SpO2 96%   BMI 30.17 kg/m    Subjective:   Patient ID: Joseph [name], male    DOB: 20-Mar-1961, 62 y.o.   MRN: [id number]  HPI: Joseph [name] is a 62 y.o. male presenting on 12/31/2022 for Medical Management of Chronic Issues, Diabetes, Hyperlipidemia, and Hypothyroidism   HPI Type 2 diabetes mellitus Patient comes in today for recheck of his diabetes. Patient has been currently taking no medicine currently, diet control. Patient is not currently on an ACE inhibitor/ARB. Patient has not seen an ophthalmologist this year. Patient denies any new issues with their feet. The symptom started onset as an adult hypertension and hyperlipidemia ARE RELATED TO DM   Hypertension Patient is currently on no medicine currently, diet control, and their blood pressure today is 124/68. Patient denies any lightheadedness or dizziness. Patient denies headaches, blurred vision, chest pains, shortness of breath, or weakness. Denies any side effects from medication and is content with current medication.   Hyperlipidemia Patient is coming in for recheck of his hyperlipidemia. The patient is currently taking Crestor and fenofibrate. They deny any issues with myalgias or history of liver damage from it. They deny any focal numbness or weakness or chest pain.   Hypothyroidism recheck Patient is coming in for thyroid recheck today as well. They deny any issues with hair changes or heat or cold problems or diarrhea or constipation. They deny any chest pain or palpitations. They are currently on levothyroxine 150 micrograms   COPD Patient is coming in for COPD recheck today.  He is currently on ProAir and Spiriva although he has not been able to do the Spiriva because he had the old HandiHaler and he was unable to do the capsule part.  We showed him the new Respimat and he likes a lot better given a sample for the Respimat.Marland Kitchen  He has a mild  chronic cough but denies any major coughing spells or wheezing spells.  He has 1 nighttime symptoms per week and 1 daytime symptoms per week currently.   Relevant past medical, surgical, family and social history reviewed and updated as indicated. Interim medical history since our last visit reviewed. Allergies and medications reviewed and updated.  Review of Systems  Constitutional:  Negative for chills and fever.  Eyes:  Negative for visual disturbance.  Respiratory:  Negative for shortness of breath and wheezing.   Cardiovascular:  Negative for chest pain and leg swelling.  Musculoskeletal:  Negative for back pain and gait problem.  Skin:  Negative for rash.  Neurological:  Negative for dizziness, weakness and light-headedness.  All other systems reviewed and are negative.   Per HPI unless specifically indicated above   Allergies as of 12/31/2022   No Known Allergies      Medication List        Accurate as of December 31, 2022  3:30 PM. If you have any questions, ask your nurse or doctor.          Accu-Chek Aviva Plus test strip Generic drug: glucose blood Use as instructed What changed: See the new instructions. Changed by: Nils Pyle, MD   albuterol 108 (90 Base) MCG/ACT inhaler Commonly known as: ProAir HFA Inhale 1-2 puffs into the lungs every 6 (six) hours as needed for wheezing or shortness of breath.   Amitiza 24 MCG capsule Generic drug: lubiprostone Take 24  mcg by mouth 2 (two) times daily.   ARIPiprazole 2 MG tablet Commonly known as: ABILIFY Take 1 tablet (2 mg total) by mouth at bedtime.   cetirizine 10 MG tablet Commonly known as: ZYRTEC Take 1 tablet (10 mg total) by mouth daily.   fenofibrate 160 MG tablet Take 1 tablet (160 mg total) by mouth daily.   finasteride 5 MG tablet Commonly known as: PROSCAR Take 1 tablet (5 mg total) by mouth daily.   hydrOXYzine 25 MG capsule Commonly known as: VISTARIL Take 25-50 mg by mouth at  bedtime as needed for anxiety (insomnia).   levothyroxine 150 MCG tablet Commonly known as: SYNTHROID Take 1 tablet (150 mcg total) by mouth daily.   mirtazapine 45 MG tablet Commonly known as: REMERON Take 1 tablet (45 mg total) by mouth at bedtime.   morphine 30 MG 12 hr tablet Commonly known as: MS CONTIN Take 30 mg by mouth every 8 (eight) hours.   oxyCODONE-acetaminophen 7.5-325 MG tablet Commonly known as: PERCOCET TAKE 1 TABLET BY MOUTH EVERY FOUR HOURS AS NEEDED FOR PAIN   pantoprazole 20 MG tablet Commonly known as: PROTONIX Take 1 tablet (20 mg total) by mouth daily.   rizatriptan 10 MG tablet Commonly known as: MAXALT TAKE 1 TABLET BY MOUTH AS NEEDED FOR MIGRAINE. MAY REPEAT IN 2 HOURS IF NEEDED   rosuvastatin 10 MG tablet Commonly known as: CRESTOR Take 1 tablet (10 mg total) by mouth daily.   sertraline 100 MG tablet Commonly known as: ZOLOFT Take 1 tablet (100 mg total) by mouth daily.   Spiriva Respimat 2.5 MCG/ACT Aers Generic drug: Tiotropium Bromide Monohydrate Inhale 2 puffs into the lungs daily.   tamsulosin 0.4 MG Caps capsule Commonly known as: FLOMAX Take 1 capsule (0.4 mg total) by mouth daily.   tiZANidine 2 MG tablet Commonly known as: ZANAFLEX Take 2 mg by mouth 3 (three) times daily as needed for muscle spasms.   triamcinolone cream 0.1 % Commonly known as: KENALOG Apply 1 Application topically 2 (two) times daily. Started by: Nils Pyle, MD   Vitamin D3 125 MCG (5000 UT) Caps Take 5,000 Units by mouth.         Objective:   BP 124/68   Pulse 87   Ht 6\' 2"  (1.88 m)   Wt 235 lb (106.6 kg)   SpO2 96%   BMI 30.17 kg/m   Wt Readings from Last 3 Encounters:  12/31/22 235 lb (106.6 kg)  09/30/22 238 lb (108 kg)  07/01/22 229 lb (103.9 kg)    Physical Exam Vitals and nursing note reviewed.  Constitutional:      General: He is not in acute distress.    Appearance: He is well-developed. He is not diaphoretic.   Eyes:     General: No scleral icterus.    Conjunctiva/sclera: Conjunctivae normal.  Neck:     Thyroid: No thyromegaly.  Cardiovascular:     Rate and Rhythm: Normal rate and regular rhythm.     Heart sounds: Normal heart sounds. No murmur heard. Pulmonary:     Effort: Pulmonary effort is normal. No respiratory distress.     Breath sounds: Normal breath sounds. No wheezing.  Musculoskeletal:        General: No swelling. Normal range of motion.     Cervical back: Neck supple.  Lymphadenopathy:     Cervical: No cervical adenopathy.  Skin:    General: Skin is warm and dry.     Findings: No rash.  Neurological:     Mental Status: He is alert and oriented to person, place, and time.     Coordination: Coordination normal.  Psychiatric:        Behavior: Behavior normal.       Assessment & Plan:   Problem List Items Addressed This Visit       Cardiovascular and Mediastinum   Hypertension associated with diabetes (HCC)   Relevant Orders   CBC with Differential/Platelet   CMP14+EGFR   Lipid panel   TSH   Bayer DCA Hb A1c Waived     Endocrine   Hypothyroidism - Primary   Relevant Orders   CBC with Differential/Platelet   CMP14+EGFR   Lipid panel   TSH   Bayer DCA Hb A1c Waived   Diabetes mellitus with chronic kidney disease (HCC)   Relevant Medications   glucose blood (ACCU-CHEK AVIVA PLUS) test strip   Hyperlipidemia associated with type 2 diabetes mellitus (HCC)   Relevant Orders   CBC with Differential/Platelet   CMP14+EGFR   Lipid panel   TSH   Bayer DCA Hb A1c Waived   Other Visit Diagnoses     Benign prostatic hyperplasia with urinary hesitancy       Relevant Medications   tamsulosin (FLOMAX) 0.4 MG CAPS capsule   Other atopic dermatitis       Relevant Medications   triamcinolone cream (KENALOG) 0.1 %   Pulmonary emphysema, unspecified emphysema type (HCC)       Relevant Medications   albuterol (PROAIR HFA) 108 (90 Base) MCG/ACT inhaler   Tiotropium  Bromide Monohydrate (SPIRIVA RESPIMAT) 2.5 MCG/ACT AERS       Patient is on able to use 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  Gave a sample for Spiriva Respimat 1.5 and sent a prescription for the 2.5.  A1c is 7.8 which is higher than it has been in a while.  He wants to focus on diet and trying to back down himself, he has done it before Follow up plan: Return in about 3 months (around 04/02/2023), or if symptoms worsen or fail to improve, for Diabetes recheck.  Counseling provided for all of the vaccine components Orders Placed This Encounter  Procedures   CBC with Differential/Platelet   CMP14+EGFR   Lipid panel   TSH   Bayer DCA Hb A1c Waived    Arville Care, MD [facility] 12/31/2022, 3:30 PM

## 2023-01-01 LAB — LIPID PANEL
Chol/HDL Ratio: 3.1 ratio (ref 0.0–5.0)
Cholesterol, Total: 114 mg/dL (ref 100–199)
HDL: 37 mg/dL — ABNORMAL LOW (ref >39)
LDL Chol Calc (NIH): 41 mg/dL (ref 0–99)
Triglycerides: 227 mg/dL — ABNORMAL HIGH (ref 0–149)
VLDL Cholesterol Cal: 36 mg/dL (ref 5–40)

## 2023-01-01 LAB — CBC WITH DIFFERENTIAL/PLATELET
Basophils Absolute: 0 10*3/uL (ref 0.0–0.2)
Basos: 1 %
EOS (ABSOLUTE): 0.1 10*3/uL (ref 0.0–0.4)
Eos: 3 %
Hematocrit: 38.1 % (ref 37.5–51.0)
Hemoglobin: 12.5 g/dL — ABNORMAL LOW (ref 13.0–17.7)
Immature Grans (Abs): 0 10*3/uL (ref 0.0–0.1)
Immature Granulocytes: 0 %
Lymphocytes Absolute: 1.4 10*3/uL (ref 0.7–3.1)
Lymphs: 30 %
MCH: 28.2 pg (ref 26.6–33.0)
MCHC: 32.8 g/dL (ref 31.5–35.7)
MCV: 86 fL (ref 79–97)
Monocytes Absolute: 0.4 10*3/uL (ref 0.1–0.9)
Monocytes: 9 %
Neutrophils Absolute: 2.7 10*3/uL (ref 1.4–7.0)
Neutrophils: 57 %
Platelets: 190 10*3/uL (ref 150–450)
RBC: 4.44 x10E6/uL (ref 4.14–5.80)
RDW: 12.3 % (ref 11.6–15.4)
WBC: 4.8 10*3/uL (ref 3.4–10.8)

## 2023-01-01 LAB — CMP14+EGFR
ALT: 20 IU/L (ref 0–44)
AST: 20 IU/L (ref 0–40)
Albumin: 4 g/dL (ref 3.9–4.9)
Alkaline Phosphatase: 34 IU/L — ABNORMAL LOW (ref 44–121)
BUN/Creatinine Ratio: 18 (ref 10–24)
BUN: 24 mg/dL (ref 8–27)
Bilirubin Total: <0.2 mg/dL (ref 0.0–1.2)
CO2: 23 mmol/L (ref 20–29)
Calcium: 9.1 mg/dL (ref 8.6–10.2)
Chloride: 105 mmol/L (ref 96–106)
Creatinine, Ser: 1.34 mg/dL — ABNORMAL HIGH (ref 0.76–1.27)
Globulin, Total: 2.3 g/dL (ref 1.5–4.5)
Glucose: 150 mg/dL — ABNORMAL HIGH (ref 70–99)
Potassium: 4.3 mmol/L (ref 3.5–5.2)
Sodium: 140 mmol/L (ref 134–144)
Total Protein: 6.3 g/dL (ref 6.0–8.5)
eGFR: 60 mL/min/{1.73_m2} (ref >59)

## 2023-01-01 LAB — TSH: TSH: 0.467 u[IU]/mL (ref 0.450–4.500)

## 2023-01-04 ENCOUNTER — Other Ambulatory Visit (HOSPITAL_COMMUNITY): Payer: Self-pay | Admitting: Psychiatry

## 2023-01-04 DIAGNOSIS — F330 Major depressive disorder, recurrent, mild: Secondary | ICD-10-CM

## 2023-01-10 DIAGNOSIS — K5900 Constipation, unspecified: Secondary | ICD-10-CM | POA: Diagnosis not present

## 2023-01-10 DIAGNOSIS — M47817 Spondylosis without myelopathy or radiculopathy, lumbosacral region: Secondary | ICD-10-CM | POA: Diagnosis not present

## 2023-01-10 DIAGNOSIS — E1142 Type 2 diabetes mellitus with diabetic polyneuropathy: Secondary | ICD-10-CM | POA: Diagnosis not present

## 2023-01-10 DIAGNOSIS — G894 Chronic pain syndrome: Secondary | ICD-10-CM | POA: Diagnosis not present

## 2023-01-17 ENCOUNTER — Telehealth (INDEPENDENT_AMBULATORY_CARE_PROVIDER_SITE_OTHER): Payer: Medicare Other | Admitting: Psychiatry

## 2023-01-17 ENCOUNTER — Encounter (HOSPITAL_COMMUNITY): Payer: Self-pay | Admitting: Psychiatry

## 2023-01-17 DIAGNOSIS — Z79899 Other long term (current) drug therapy: Secondary | ICD-10-CM | POA: Diagnosis not present

## 2023-01-17 DIAGNOSIS — F064 Anxiety disorder due to known physiological condition: Secondary | ICD-10-CM | POA: Diagnosis not present

## 2023-01-17 DIAGNOSIS — G4709 Other insomnia: Secondary | ICD-10-CM

## 2023-01-17 DIAGNOSIS — G894 Chronic pain syndrome: Secondary | ICD-10-CM

## 2023-01-17 DIAGNOSIS — F331 Major depressive disorder, recurrent, moderate: Secondary | ICD-10-CM | POA: Diagnosis not present

## 2023-01-17 MED ORDER — SERTRALINE HCL 100 MG PO TABS: 100.0000 mg | ORAL_TABLET | Freq: Every day | ORAL | 1 refills | Status: DC

## 2023-01-17 MED ORDER — ARIPIPRAZOLE 2 MG PO TABS
2.0000 mg | ORAL_TABLET | Freq: Every evening | ORAL | 1 refills | Status: DC
Start: 2023-01-17 — End: 2023-07-26

## 2023-01-17 MED ORDER — MIRTAZAPINE 45 MG PO TABS: 45.0000 mg | ORAL_TABLET | Freq: Every day | ORAL | 1 refills | Status: DC
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
## 2023-02-08 DIAGNOSIS — K5900 Constipation, unspecified: Secondary | ICD-10-CM | POA: Diagnosis not present

## 2023-02-08 DIAGNOSIS — E1142 Type 2 diabetes mellitus with diabetic polyneuropathy: Secondary | ICD-10-CM | POA: Diagnosis not present

## 2023-02-08 DIAGNOSIS — G894 Chronic pain syndrome: Secondary | ICD-10-CM | POA: Diagnosis not present

## 2023-02-08 DIAGNOSIS — M47817 Spondylosis without myelopathy or radiculopathy, lumbosacral region: Secondary | ICD-10-CM | POA: Diagnosis not present

## 2023-03-05 ENCOUNTER — Other Ambulatory Visit: Payer: Self-pay | Admitting: Family Medicine

## 2023-03-05 DIAGNOSIS — I152 Hypertension secondary to endocrine disorders: Secondary | ICD-10-CM

## 2023-03-05 DIAGNOSIS — E1169 Type 2 diabetes mellitus with other specified complication: Secondary | ICD-10-CM

## 2023-03-05 DIAGNOSIS — E782 Mixed hyperlipidemia: Secondary | ICD-10-CM

## 2023-03-07 ENCOUNTER — Ambulatory Visit (INDEPENDENT_AMBULATORY_CARE_PROVIDER_SITE_OTHER): Payer: Medicare Other

## 2023-03-07 ENCOUNTER — Encounter (INDEPENDENT_AMBULATORY_CARE_PROVIDER_SITE_OTHER): Payer: Self-pay | Admitting: *Deleted

## 2023-03-07 VITALS — Ht 74.0 in | Wt 225.0 lb

## 2023-03-07 DIAGNOSIS — Z1211 Encounter for screening for malignant neoplasm of colon: Secondary | ICD-10-CM | POA: Diagnosis not present

## 2023-03-07 DIAGNOSIS — Z0000 Encounter for general adult medical examination without abnormal findings: Secondary | ICD-10-CM

## 2023-03-07 NOTE — Patient Instructions (Signed)
[name] , Thank you for taking time to come for your Medicare Wellness Visit. I appreciate your ongoing commitment to your health goals. Please review the following plan we discussed and let me know if I can assist you in the future.   Referrals/Orders/Follow-Ups/Clinician Recommendations: Aim for 30 minutes of exercise or brisk walking, 6-8 glasses of water, and 5 servings of fruits and vegetables each day.   This is a list of the screening recommended for you and due dates:  Health Maintenance  Topic Date Due   Eye exam for diabetics  12/29/2022   Flu Shot  02/17/2023   Zoster (Shingles) Vaccine (1 of 2) 04/04/2023*   Colon Cancer Screening  12/31/2023*   COVID-19 Vaccine (1) 01/02/2024*   Hemoglobin A1C  07/02/2023   Yearly kidney health urinalysis for diabetes  09/30/2023   Complete foot exam   09/30/2023   Yearly kidney function blood test for diabetes  12/31/2023   Medicare Annual Wellness Visit  03/06/2024   DTaP/Tdap/Td vaccine (4 - Td or Tdap) 12/26/2031   Hepatitis C Screening  Completed   HIV Screening  Completed   HPV Vaccine  Aged Out   Stool Blood Test  Discontinued  *Topic was postponed. The date shown is not the original due date.    Advanced directives: (Copy Requested) Please bring a copy of your health care power of attorney and living will to the office to be added to your chart at your convenience.  Next Medicare Annual Wellness Visit scheduled for next year: Yes  Preventive Care 40-64 Years, Male Preventive care refers to lifestyle choices and visits with your health care provider that can promote health and wellness. What does preventive care include? A yearly physical exam. This is also called an annual well check. Dental exams once or twice a year. Routine eye exams. Ask your health care provider how often you should have your eyes checked. Personal lifestyle choices, including: Daily care of your teeth and gums. Regular physical activity. Eating a  healthy diet. Avoiding tobacco and drug use. Limiting alcohol use. Practicing safe sex. Taking low-dose aspirin every day starting at age 79. What happens during an annual well check? The services and screenings done by your health care provider during your annual well check will depend on your age, overall health, lifestyle risk factors, and family history of disease. Counseling  Your health care provider may ask you questions about your: Alcohol use. Tobacco use. Drug use. Emotional well-being. Home and relationship well-being. Sexual activity. Eating habits. Work and work Astron[employer]. Screening  You may have the following tests or measurements: Height, weight, and BMI. Blood pressure. Lipid and cholesterol levels. These may be checked every 5 years, or more frequently if you are over 102 years old. Skin check. Lung cancer screening. You may have this screening every year starting at age 18 if you have a 30-pack-year history of smoking and currently smoke or have quit within the past 15 years. Fecal occult blood test (FOBT) of the stool. You may have this test every year starting at age 29. Flexible sigmoidoscopy or colonoscopy. You may have a sigmoidoscopy every 5 years or a colonoscopy every 10 years starting at age 31. Prostate cancer screening. Recommendations will vary depending on your family history and other risks. Hepatitis C blood test. Hepatitis B blood test. Sexually transmitted disease (STD) testing. Diabetes screening. This is done by checking your blood sugar (glucose) after you have not eaten for a while (fasting). You may have  this done every 1-3 years. Discuss your test results, treatment options, and if necessary, the need for more tests with your health care provider. Vaccines  Your health care provider may recommend certain vaccines, such as: Influenza vaccine. This is recommended every year. Tetanus, diphtheria, and acellular pertussis (Tdap, Td) vaccine. You  may need a Td booster every 10 years. Zoster vaccine. You may need this after age 53. Pneumococcal 13-valent conjugate (PCV13) vaccine. You may need this if you have certain conditions and have not been vaccinated. Pneumococcal polysaccharide (PPSV23) vaccine. You may need one or two doses if you smoke cigarettes or if you have certain conditions. Talk to your health care provider about which screenings and vaccines you need and how often you need them. This information is not intended to replace advice given to you by your health care provider. Make sure you discuss any questions you have with your health care provider. Document Released: 08/01/2015 Document Revised: 03/24/2016 Document Reviewed: 05/06/2015 Elsevier Interactive Patient Education  2017 [employer].  Fall Prevention in the Home Falls can cause injuries. They can happen to people of all ages. There are many things you can do to make your home safe and to help prevent falls. What can I do on the outside of my home? Regularly fix the edges of walkways and driveways and fix any cracks. Remove anything that might make you trip as you walk through a door, such as a raised step or threshold. Trim any bushes or trees on the path to your home. Use bright outdoor lighting. Clear any walking paths of anything that might make someone trip, such as rocks or tools. Regularly check to see if handrails are loose or broken. Make sure that both sides of any steps have handrails. Any raised decks and porches should have guardrails on the edges. Have any leaves, snow, or ice cleared regularly. Use sand or salt on walking paths during winter. Clean up any spills in your garage right away. This includes oil or grease spills. What can I do in the bathroom? Use night lights. Install grab bars by the toilet and in the tub and shower. Do not use towel bars as grab bars. Use non-skid mats or decals in the tub or shower. If you need to sit down in  the shower, use a plastic, non-slip stool. Keep the floor dry. Clean up any water that spills on the floor as soon as it happens. Remove soap buildup in the tub or shower regularly. Attach bath mats securely with double-sided non-slip rug tape. Do not have throw rugs and other things on the floor that can make you trip. What can I do in the bedroom? Use night lights. Make sure that you have a light by your bed that is easy to reach. Do not use any sheets or blankets that are too big for your bed. They should not hang down onto the floor. Have a firm chair that has side arms. You can use this for support while you get dressed. Do not have throw rugs and other things on the floor that can make you trip. What can I do in the kitchen? Clean up any spills right away. Avoid walking on wet floors. Keep items that you use a lot in easy-to-reach places. If you need to reach something above you, use a strong step stool that has a grab bar. Keep electrical cords out of the way. Do not use floor polish or wax that makes floors slippery. If you  must use wax, use non-skid floor wax. Do not have throw rugs and other things on the floor that can make you trip. What can I do with my stairs? Do not leave any items on the stairs. Make sure that there are handrails on both sides of the stairs and use them. Fix handrails that are broken or loose. Make sure that handrails are as long as the stairways. Check any carpeting to make sure that it is firmly attached to the stairs. Fix any carpet that is loose or worn. Avoid having throw rugs at the top or bottom of the stairs. If you do have throw rugs, attach them to the floor with carpet tape. Make sure that you have a light switch at the top of the stairs and the bottom of the stairs. If you do not have them, ask someone to add them for you. What else can I do to help prevent falls? Wear shoes that: Do not have high heels. Have rubber bottoms. Are comfortable  and fit you well. Are closed at the toe. Do not wear sandals. If you use a stepladder: Make sure that it is fully opened. Do not climb a closed stepladder. Make sure that both sides of the stepladder are locked into place. Ask someone to hold it for you, if possible. Clearly mark and make sure that you can see: Any grab bars or handrails. First and last steps. Where the edge of each step is. Use tools that help you move around (mobility aids) if they are needed. These include: Canes. Walkers. Scooters. Crutches. Turn on the lights when you go into a dark area. Replace any light bulbs as soon as they burn out. Set up your furniture so you have a clear path. Avoid moving your furniture around. If any of your floors are uneven, fix them. If there are any pets around you, be aware of where they are. Review your medicines with your doctor. Some medicines can make you feel dizzy. This can increase your chance of falling. Ask your doctor what other things that you can do to help prevent falls. This information is not intended to replace advice given to you by your health care provider. Make sure you discuss any questions you have with your health care provider. Document Released: 05/01/2009 Document Revised: 12/11/2015 Document Reviewed: 08/09/2014 Elsevier Interactive Patient Education  2017 [employer].

## 2023-03-07 NOTE — Progress Notes (Signed)
Subjective:   Joseph [name] is a 62 y.o. male who presents for Medicare Annual/Subsequent preventive examination.  Visit Complete: Virtual  I connected with  [name] on 03/07/23 by a audio enabled telemedicine application and verified that I am speaking with the correct person using two identifiers.  Patient Location: Home  Provider Location: Home Office  I discussed the limitations of evaluation and management by telemedicine. The patient expressed understanding and agreed to proceed.  Patient Medicare AWV questionnaire was completed by the patient on 03/07/2023; I have confirmed that all information answered by patient is correct and no changes since this date.  Review of Systems    Vital Signs: Unable to obtain new vitals due to this being a telehealth visit.  Cardiac Risk Factors include: advanced age (>33men, >69 women);hypertension;diabetes mellitus;dyslipidemia;male genderNutrition Risk Assessment:  Has the patient had any N/V/D within the last 2 months?  No  Does the patient have any non-healing wounds?  No  Has the patient had any unintentional weight loss or weight gain?  No   Diabetes:  Is the patient diabetic?  Yes  If diabetic, was a CBG obtained today?  No  Did the patient bring in their glucometer from home?  No  How often do you monitor your CBG's? Never .   Financial Strains and Diabetes Management:  Are you having any financial strains with the device, your supplies or your medication? No .  Does the patient want to be seen by Chronic Care Management for management of their diabetes?  No  Would the patient like to be referred to a Nutritionist or for Diabetic Management?  No   Diabetic Exams:  Diabetic Eye Exam: Completed 04/2022 Diabetic Foot Exam: Overdue, Pt has been advised about the importance in completing this exam. Pt is scheduled for diabetic foot exam on next office visit .      Objective:    Today's Vitals   03/07/23 1307   Weight: 225 lb (102.1 kg)  Height: 6\' 2"  (1.88 m)   Body mass index is 28.89 kg/m.     03/07/2023    1:10 PM 03/03/2022    3:18 PM 03/02/2021    4:25 PM 03/09/2019    2:39 PM 03/07/2018    3:29 PM 02/07/2017    8:18 AM 11/23/2016    2:08 PM  Advanced Directives  Does Patient Have a Medical Advance Directive? Yes No Yes Yes Yes No Yes  Type of Estate agent of [place];Living will  Healthcare Power of [place];Living will Healthcare Power of [place];Living will Healthcare Power of [place];Living will  Healthcare Power of [place];Living will  Does patient want to make changes to medical advance directive?    No - Patient declined No - Patient declined  No - Patient declined  Copy of Healthcare Power of Attorney in Chart? No - copy requested  No - copy requested No - copy requested No - copy requested  No - copy requested  Would patient like information on creating a medical advance directive?  No - Patient declined    No - Patient declined     Current Medications (verified) Outpatient Encounter Medications as of 03/07/2023  Medication Sig   albuterol (PROAIR HFA) 108 (90 Base) MCG/ACT inhaler Inhale 1-2 puffs into the lungs every 6 (six) hours as needed for wheezing or shortness of breath.   AMITIZA 24 MCG capsule Take 24 mcg by mouth 2 (two) times daily.   ARIPiprazole (ABILIFY) 2 MG tablet  Take 1 tablet (2 mg total) by mouth at bedtime.   cetirizine (ZYRTEC) 10 MG tablet Take 1 tablet (10 mg total) by mouth daily.   Cholecalciferol (VITAMIN D3) 5000 units CAPS Take 5,000 Units by mouth.   fenofibrate 160 MG tablet TAKE 1 TABLET BY MOUTH EVERY DAY   finasteride (PROSCAR) 5 MG tablet Take 1 tablet (5 mg total) by mouth daily.   glucose blood (ACCU-CHEK AVIVA PLUS) test strip Use as instructed   hydrOXYzine (VISTARIL) 25 MG capsule Take 25-50 mg by mouth at bedtime as needed for anxiety (insomnia).   levothyroxine (SYNTHROID) 150 MCG tablet Take 1 tablet (150 mcg  total) by mouth daily.   mirtazapine (REMERON) 45 MG tablet Take 1 tablet (45 mg total) by mouth at bedtime.   morphine (MS CONTIN) 30 MG 12 hr tablet Take 30 mg by mouth every 8 (eight) hours.   oxyCODONE-acetaminophen (PERCOCET) 7.5-325 MG tablet TAKE 1 TABLET BY MOUTH EVERY FOUR HOURS AS NEEDED FOR PAIN   pantoprazole (PROTONIX) 20 MG tablet Take 1 tablet (20 mg total) by mouth daily.   pregabalin (LYRICA) 75 MG capsule Take 75 mg by mouth 3 (three) times daily.   rizatriptan (MAXALT) 10 MG tablet TAKE 1 TABLET BY MOUTH AS NEEDED FOR MIGRAINE. MAY REPEAT IN 2 HOURS IF NEEDED   rosuvastatin (CRESTOR) 10 MG tablet Take 1 tablet (10 mg total) by mouth daily.   sertraline (ZOLOFT) 100 MG tablet Take 1 tablet (100 mg total) by mouth daily.   tamsulosin (FLOMAX) 0.4 MG CAPS capsule Take 1 capsule (0.4 mg total) by mouth daily.   Tiotropium Bromide Monohydrate (SPIRIVA RESPIMAT) 1.25 MCG/ACT AERS Inhale 2 puffs into the lungs daily.   Tiotropium Bromide Monohydrate (SPIRIVA RESPIMAT) 2.5 MCG/ACT AERS Inhale 2 puffs into the lungs daily.   tiZANidine (ZANAFLEX) 2 MG tablet Take 2 mg by mouth 3 (three) times daily as needed for muscle spasms.   triamcinolone cream (KENALOG) 0.1 % Apply 1 Application topically 2 (two) times daily.   No facility-administered encounter medications on file as of 03/07/2023.    Allergies (verified) Patient has no known allergies.   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   Family History  Problem Relation Age of Onset   Emphysema Sister    Prostate cancer Maternal Grandfather    Clotting  disorder Maternal Grandfather    Cancer Maternal Grandmother        brain   Depression Maternal Uncle    Atopy Neg Hx    Social History   Socioeconomic History   Marital status: Married    Spouse name: Jacki Cones   Number of children: 2   Years of education: 12   Highest education level: High school graduate  Occupational History   Occupation: disability  Tobacco Use   Smoking status: Former    Current packs/day: 0.00    Average packs/day: 3.0 packs/day for 30.0 years (90.0 ttl pk-yrs)    Types: Cigarettes    Start date: 07/19/1973    Quit date:  07/20/2003    Years since quitting: 19.6   Smokeless tobacco: Never  Vaping Use   Vaping status: Never Used  Substance and Sexual Activity   Alcohol use: Not Currently    Comment: per week   Drug use: No   Sexual activity: Yes  Other Topics Concern   Not on file  Social History Narrative   Married with 2 children   Social Determinants of Health   Financial Resource Strain: Low Risk  (03/07/2023)   Overall Financial Resource Strain (CARDIA)    Difficulty of Paying Living Expenses: Not hard at all  Food Insecurity: No Food Insecurity (03/07/2023)   Hunger Vital Sign    Worried About Running Out of Food in the Last Year: Never true    Ran Out of Food in the Last Year: Never true  Transportation Needs: No Transportation Needs (03/07/2023)   PRAPARE - Administrator, Civil Service (Medical): No    Lack of Transportation (Non-Medical): No  Physical Activity: Inactive (03/07/2023)   Exercise Vital Sign    Days of Exercise per Week: 0 days    Minutes of Exercise per Session: 0 min  Stress: No Stress Concern Present (03/07/2023)   [employer] of Occupational Health - Occupational Stress Questionnaire    Feeling of Stress : Not at all  Social Connections: Moderately Isolated (03/07/2023)   Social Connection and Isolation Panel [NHANES]    Frequency of Communication with Friends and Family: More than three times a week     Frequency of Social Gatherings with Friends and Family: More than three times a week    Attends Religious Services: Never    Database administrator or Organizations: No    Attends Engineer, structural: Never    Marital Status: Married    Tobacco Counseling Counseling given: Not Answered   Clinical Intake:  Pre-visit preparation completed: Yes  Pain : No/denies pain     Nutritional Risks: None Diabetes: Yes CBG done?: No Did pt. bring in CBG monitor from home?: No  How often do you need to have someone help you when you read instructions, pamphlets, or other written materials from your doctor or pharmacy?: 1 - Never  Interpreter Needed?: No  Information entered by :: Renie Ora, LPN   Activities of Daily Living    03/07/2023    1:11 PM  In your present state of health, do you have any difficulty performing the following activities:  Hearing? 0  Vision? 0  Difficulty concentrating or making decisions? 0  Walking or climbing stairs? 0  Dressing or bathing? 0  Doing errands, shopping? 0  Preparing Food and eating ? N  Using the Toilet? N  In the past six months, have you accidently leaked urine? N  Do you have problems with loss of bowel control? N  Managing your Medications? N  Managing your Finances? N  Housekeeping or managing your Housekeeping? N    Patient Care Team: Dettinger, Elige Radon, MD as PCP - General (Family Medicine) Verdon Cummins, MD as Referring Physician (Physical Medicine and Rehabilitation) Services, [facility] as Referring Physician (Psychiatry) Jerilee Field, MD as Consulting Physician (Urology)  Indicate any recent Medical Services you may have received from other than [facility] providers in the past year (date may be approximate).     Assessment:   This is a routine wellness examination for Glen.  Hearing/Vision screen Vision Screening - Comments:: Wears rx glasses - up to date with routine eye exams  with  Dr.Barts    Dietary issues and exercise activities discussed:     Goals Addressed             This Visit's Progress    Cut out extra servings         Depression Screen    03/07/2023    1:09 PM 12/31/2022    3:17 PM 09/30/2022    3:35 PM 07/01/2022    3:26 PM 03/31/2022    3:51 PM 03/03/2022    3:15 PM 12/25/2021    3:37 PM  PHQ 2/9 Scores  PHQ - 2 Score 0 0  0 0 0 0  PHQ- 9 Score    0     Exception Documentation   Patient refusal        Fall Risk    03/07/2023    1:08 PM 12/31/2022    3:17 PM 07/01/2022    3:26 PM 03/31/2022    3:51 PM 03/03/2022    3:11 PM  Fall Risk   Falls in the past year? 0 0 0 0 0  Number falls in past yr: 0    0  Injury with Fall? 0    0  Risk for fall due to : No Fall Risks    Orthopedic patient  Follow up Falls prevention discussed    Falls prevention discussed    MEDICARE RISK AT HOME: Medicare Risk at Home Any stairs in or around the home?: No If so, are there any without handrails?: No Home free of loose throw rugs in walkways, pet beds, electrical cords, etc?: Yes Adequate lighting in your home to reduce risk of falls?: Yes Life alert?: No Use of a cane, walker or w/c?: No Grab bars in the bathroom?: Yes Shower chair or bench in shower?: Yes Elevated toilet seat or a handicapped toilet?: Yes  TIMED UP AND GO:  Was the test performed?  No    Cognitive Function:    03/07/2018    3:33 PM 11/23/2016    2:13 PM  MMSE - Mini Mental State Exam  Orientation to time 5 5  Orientation to Place 4 5  Registration 3 3  Attention/ Calculation 5 5  Recall 3 3  Language- name 2 objects 2 2  Language- repeat 1 1  Language- follow 3 step command 3 3  Language- read & follow direction 1 1  Write a sentence 1 1  Copy design 1 1  Total score 29 30        03/07/2023    1:11 PM 03/03/2022    3:20 PM 03/02/2021    4:25 PM 03/09/2019    2:42 PM  6CIT Screen  What Year? 0 points 0 points 0 points 0 points  What month? 0 points 0 points 0 points 0  points  What time? 0 points 0 points 0 points 0 points  Count back from 20 0 points 0 points 0 points 0 points  Months in reverse 0 points 0 points 0 points 0 points  Repeat phrase 0 points 0 points 0 points 2 points  Total Score 0 points 0 points 0 points 2 points    Immunizations Immunization History  Administered Date(s) Administered   Influenza Nasal 04/28/2012   Influenza,inj,Quad PF,6+ Mos 06/12/2013, 04/14/2016, 07/27/2017, 06/08/2018, 04/09/2019, 08/15/2020, 06/26/2021, 03/31/2022   Influenza-Unspecified 05/19/2009, 05/01/2010, 05/22/2015   Pneumococcal Conjugate-13 07/27/2017   Pneumococcal Polysaccharide-23 07/31/2018   Pneumococcal-Unspecified 04/28/2012   Rabies, IM 02/07/2017, 02/10/2017, 02/14/2017, 02/21/2017   Td 12/17/2011  Tdap 12/17/2011, 12/25/2021    TDAP status: Up to date  Flu Vaccine status: Up to date  Pneumococcal vaccine status: Up to date  Covid-19 vaccine status: Completed vaccines  Qualifies for Shingles Vaccine? Yes   Zostavax completed No   Shingrix Completed?: No.    Education has been provided regarding the importance of this vaccine. Patient has been advised to call insurance company to determine out of pocket expense if they have not yet received this vaccine. Advised may also receive vaccine at local pharmacy or Health Dept. Verbalized acceptance and understanding.  Screening Tests Health Maintenance  Topic Date Due   OPHTHALMOLOGY EXAM  12/29/2022   INFLUENZA VACCINE  02/17/2023   Zoster Vaccines- Shingrix (1 of 2) 04/04/2023 (Originally [birth year]/1979)   Colonoscopy  12/31/2023 (Originally 09/05/2005)   COVID-19 Vaccine (1) 01/02/2024 (Originally [birth year]1965)   HEMOGLOBIN A1C  07/02/2023   Diabetic kidney evaluation - Urine ACR  09/30/2023   FOOT EXAM  09/30/2023   Diabetic kidney evaluation - eGFR measurement  12/31/2023   Medicare Annual Wellness (AWV)  03/06/2024   DTaP/Tdap/Td (4 - Td or Tdap) 12/26/2031   Hepatitis C Screening   Completed   HIV Screening  Completed   HPV VACCINES  Aged Out   COLON CANCER SCREENING ANNUAL FOBT  Discontinued    Health Maintenance  Health Maintenance Due  Topic Date Due   OPHTHALMOLOGY EXAM  12/29/2022   INFLUENZA VACCINE  02/17/2023    Colorectal cancer screening: Referral to GI placed 03/07/2023. Pt aware the office will call re: appt.  Lung Cancer Screening: (Low Dose CT Chest recommended if Age 19-80 years, 20 pack-year currently smoking OR have quit w/in 15years.) does not qualify.   Lung Cancer Screening Referral: n/a  Additional Screening:  Hepatitis C Screening: does not qualify; Completed 04/14/2016  Vision Screening: Recommended annual ophthalmology exams for early detection of glaucoma and other disorders of the eye. Is the patient up to date with their annual eye exam?  No  Who is the provider or what is the name of the office in which the patient attends annual eye exams? Dr.Barts If pt is not established with a provider, would they like to be referred to a provider to establish care? No .   Dental Screening: Recommended annual dental exams for proper oral hygiene   Community Resource Referral / Chronic Care Management: CRR required this visit?  No   CCM required this visit?  No     Plan:     I have personally reviewed and noted the following in the patient's chart:   Medical and social history Use of alcohol, tobacco or illicit drugs  Current medications and supplements including opioid prescriptions. Patient is not currently taking opioid prescriptions. Functional ability and status Nutritional status Physical activity Advanced directives List of other physicians Hospitalizations, surgeries, and ER visits in previous 12 months Vitals Screenings to include cognitive, depression, and falls Referrals and appointments  In addition, I have reviewed and discussed with patient certain preventive protocols, quality metrics, and best practice  recommendations. A written personalized care plan for preventive services as well as general preventive health recommendations were provided to patient.     Lorrene Reid, LPN   1/61/0960   After Visit Summary: (MyChart) Due to this being a telephonic visit, the after visit summary with patients personalized plan was offered to patient via MyChart   Nurse Notes: none

## 2023-03-10 DIAGNOSIS — G894 Chronic pain syndrome: Secondary | ICD-10-CM | POA: Diagnosis not present

## 2023-03-10 DIAGNOSIS — K5900 Constipation, unspecified: Secondary | ICD-10-CM | POA: Diagnosis not present

## 2023-03-10 DIAGNOSIS — E1142 Type 2 diabetes mellitus with diabetic polyneuropathy: Secondary | ICD-10-CM | POA: Diagnosis not present

## 2023-03-10 DIAGNOSIS — M47817 Spondylosis without myelopathy or radiculopathy, lumbosacral region: Secondary | ICD-10-CM | POA: Diagnosis not present

## 2023-03-10 DIAGNOSIS — Z79891 Long term (current) use of opiate analgesic: Secondary | ICD-10-CM | POA: Diagnosis not present

## 2023-03-28 DIAGNOSIS — E1142 Type 2 diabetes mellitus with diabetic polyneuropathy: Secondary | ICD-10-CM | POA: Diagnosis not present

## 2023-03-28 DIAGNOSIS — G894 Chronic pain syndrome: Secondary | ICD-10-CM | POA: Diagnosis not present

## 2023-03-28 DIAGNOSIS — M47817 Spondylosis without myelopathy or radiculopathy, lumbosacral region: Secondary | ICD-10-CM | POA: Diagnosis not present

## 2023-03-28 DIAGNOSIS — K5900 Constipation, unspecified: Secondary | ICD-10-CM | POA: Diagnosis not present

## 2023-04-06 ENCOUNTER — Encounter: Payer: Self-pay | Admitting: Family Medicine

## 2023-04-06 ENCOUNTER — Ambulatory Visit (INDEPENDENT_AMBULATORY_CARE_PROVIDER_SITE_OTHER): Payer: Medicare Other | Admitting: Family Medicine

## 2023-04-06 VITALS — BP 130/84 | HR 72 | Temp 98.0°F | Ht 74.0 in | Wt 244.8 lb

## 2023-04-06 DIAGNOSIS — I152 Hypertension secondary to endocrine disorders: Secondary | ICD-10-CM | POA: Diagnosis not present

## 2023-04-06 DIAGNOSIS — E785 Hyperlipidemia, unspecified: Secondary | ICD-10-CM

## 2023-04-06 DIAGNOSIS — E1159 Type 2 diabetes mellitus with other circulatory complications: Secondary | ICD-10-CM | POA: Diagnosis not present

## 2023-04-06 DIAGNOSIS — E039 Hypothyroidism, unspecified: Secondary | ICD-10-CM

## 2023-04-06 DIAGNOSIS — E782 Mixed hyperlipidemia: Secondary | ICD-10-CM

## 2023-04-06 DIAGNOSIS — E1169 Type 2 diabetes mellitus with other specified complication: Secondary | ICD-10-CM

## 2023-04-06 DIAGNOSIS — N1831 Chronic kidney disease, stage 3a: Secondary | ICD-10-CM | POA: Diagnosis not present

## 2023-04-06 DIAGNOSIS — Z794 Long term (current) use of insulin: Secondary | ICD-10-CM | POA: Diagnosis not present

## 2023-04-06 DIAGNOSIS — E0822 Diabetes mellitus due to underlying condition with diabetic chronic kidney disease: Secondary | ICD-10-CM | POA: Diagnosis not present

## 2023-04-06 DIAGNOSIS — Z23 Encounter for immunization: Secondary | ICD-10-CM | POA: Diagnosis not present

## 2023-04-06 LAB — BAYER DCA HB A1C WAIVED: HB A1C (BAYER DCA - WAIVED): 9.4 % — ABNORMAL HIGH (ref 4.8–5.6)

## 2023-04-06 MED ORDER — FENOFIBRATE 160 MG PO TABS: 160.0000 mg | ORAL_TABLET | Freq: Every day | ORAL | 0 refills | Status: DC

## 2023-04-06 MED ORDER — METFORMIN HCL 1000 MG PO TABS
ORAL_TABLET | ORAL | 2 refills | Status: DC
Start: 2023-04-06 — End: 2023-07-07

## 2023-04-06 NOTE — Progress Notes (Signed)
BP 130/84   Pulse 72   Temp 98 F (36.7 C) (Temporal)   Ht 6\' 2"  (1.88 m)   Wt 244 lb 12.8 oz (111 kg)   SpO2 93%   BMI 31.43 kg/m    Subjective:   Patient ID: Joseph [name], male    DOB: 1960/10/06, 62 y.o.   MRN: [id number]  HPI: Joseph [name] is a 62 y.o. male presenting on 04/06/2023 for Medical Management of Chronic Issues (3 month)   HPI Type 2 diabetes mellitus Patient comes in today for recheck of his diabetes. Patient has been currently taking no medicine currently, had been diet controlled. Patient is not currently on an ACE inhibitor/ARB. Patient has not seen an ophthalmologist this year. Patient denies any new issues with their feet. The symptom started onset as an adult hyperlipidemia and hypertension and hypothyroidism and CKD ARE RELATED TO DM   Hypothyroidism recheck Patient is coming in for thyroid recheck today as well. They deny any issues with hair changes or heat or cold problems or diarrhea or constipation. They deny any chest pain or palpitations. They are currently on levothyroxine 150 micrograms   Hyperlipidemia Patient is coming in for recheck of his hyperlipidemia. The patient is currently taking fenofibrate and Crestor. They deny any issues with myalgias or history of liver damage from it. They deny any focal numbness or weakness or chest pain.   Hypertension Patient is currently on no medicine currently, and their blood pressure today is 130/84. Patient denies any lightheadedness or dizziness. Patient denies headaches, blurred vision, chest pains, shortness of breath, or weakness. Denies any side effects from medication and is content with current medication.   Relevant past medical, surgical, family and social history reviewed and updated as indicated. Interim medical history since our last visit reviewed. Allergies and medications reviewed and updated.  Review of Systems  Constitutional:  Negative for chills and fever.  Eyes:  Negative for  visual disturbance.  Respiratory:  Negative for shortness of breath and wheezing.   Cardiovascular:  Negative for chest pain and leg swelling.  Musculoskeletal:  Negative for back pain and gait problem.  Skin:  Negative for rash.  Neurological:  Negative for dizziness, weakness and headaches.  All other systems reviewed and are negative.   Per HPI unless specifically indicated above   Allergies as of 04/06/2023   No Known Allergies      Medication List        Accurate as of April 06, 2023  3:31 PM. If you have any questions, ask your nurse or doctor.          Accu-Chek Aviva Plus test strip Generic drug: glucose blood Use as instructed   albuterol 108 (90 Base) MCG/ACT inhaler Commonly known as: ProAir HFA Inhale 1-2 puffs into the lungs every 6 (six) hours as needed for wheezing or shortness of breath.   Amitiza 24 MCG capsule Generic drug: lubiprostone Take 24 mcg by mouth 2 (two) times daily.   ARIPiprazole 2 MG tablet Commonly known as: ABILIFY Take 1 tablet (2 mg total) by mouth at bedtime.   cetirizine 10 MG tablet Commonly known as: ZYRTEC Take 1 tablet (10 mg total) by mouth daily.   fenofibrate 160 MG tablet Take 1 tablet (160 mg total) by mouth daily.   finasteride 5 MG tablet Commonly known as: PROSCAR Take 1 tablet (5 mg total) by mouth daily.   hydrOXYzine 25 MG capsule Commonly known as: VISTARIL Take 25-50 mg by  mouth at bedtime as needed for anxiety (insomnia).   levothyroxine 150 MCG tablet Commonly known as: SYNTHROID Take 1 tablet (150 mcg total) by mouth daily.   metFORMIN 1000 MG tablet Commonly known as: GLUCOPHAGE Take 0.5 tablets (500 mg total) by mouth 2 (two) times daily with a meal for 7 days, THEN 1 tablet (1,000 mg total) 2 (two) times daily with a meal. Start taking on: April 06, 2023 Started by: [name] A Antonia Jicha   mirtazapine 45 MG tablet Commonly known as: REMERON Take 1 tablet (45 mg total) by mouth at  bedtime.   morphine 30 MG 12 hr tablet Commonly known as: MS CONTIN Take 30 mg by mouth every 8 (eight) hours.   oxyCODONE-acetaminophen 7.5-325 MG tablet Commonly known as: PERCOCET TAKE 1 TABLET BY MOUTH EVERY FOUR HOURS AS NEEDED FOR PAIN   pantoprazole 20 MG tablet Commonly known as: PROTONIX Take 1 tablet (20 mg total) by mouth daily.   pregabalin 75 MG capsule Commonly known as: LYRICA Take 75 mg by mouth 3 (three) times daily.   rizatriptan 10 MG tablet Commonly known as: MAXALT TAKE 1 TABLET BY MOUTH AS NEEDED FOR MIGRAINE. MAY REPEAT IN 2 HOURS IF NEEDED   rosuvastatin 10 MG tablet Commonly known as: CRESTOR Take 1 tablet (10 mg total) by mouth daily.   sertraline 100 MG tablet Commonly known as: ZOLOFT Take 1 tablet (100 mg total) by mouth daily.   Spiriva Respimat 2.5 MCG/ACT Aers Generic drug: Tiotropium Bromide Monohydrate Inhale 2 puffs into the lungs daily. What changed: Another medication with the same name was removed. Continue taking this medication, and follow the directions you see here. Changed by: Elige Radon Kalyani Maeda   tamsulosin 0.4 MG Caps capsule Commonly known as: FLOMAX Take 1 capsule (0.4 mg total) by mouth daily.   tiZANidine 2 MG tablet Commonly known as: ZANAFLEX Take 2 mg by mouth 3 (three) times daily as needed for muscle spasms.   triamcinolone cream 0.1 % Commonly known as: KENALOG Apply 1 Application topically 2 (two) times daily.   Vitamin D3 125 MCG (5000 UT) Caps Take 5,000 Units by mouth.         Objective:   BP 130/84   Pulse 72   Temp 98 F (36.7 C) (Temporal)   Ht 6\' 2"  (1.88 m)   Wt 244 lb 12.8 oz (111 kg)   SpO2 93%   BMI 31.43 kg/m   Wt Readings from Last 3 Encounters:  04/06/23 244 lb 12.8 oz (111 kg)  03/07/23 225 lb (102.1 kg)  12/31/22 235 lb (106.6 kg)    Physical Exam Vitals and nursing note reviewed.  Constitutional:      General: He is not in acute distress.    Appearance: He is  well-developed. He is not diaphoretic.  Eyes:     General: No scleral icterus.    Conjunctiva/sclera: Conjunctivae normal.  Neck:     Thyroid: No thyromegaly.  Cardiovascular:     Rate and Rhythm: Normal rate and regular rhythm.     Heart sounds: Normal heart sounds. No murmur heard. Pulmonary:     Effort: Pulmonary effort is normal. No respiratory distress.     Breath sounds: Normal breath sounds. No wheezing.  Musculoskeletal:        General: No swelling. Normal range of motion.     Cervical back: Neck supple.  Lymphadenopathy:     Cervical: No cervical adenopathy.  Skin:    General: Skin is warm and  dry.     Findings: No rash.  Neurological:     Mental Status: He is alert and oriented to person, place, and time.     Coordination: Coordination normal.  Psychiatric:        Behavior: Behavior normal.       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  Will start metformin twice a day.  Can start taking it at half a tablet twice a day and then go up to the full 1000 mg twice a day after 1 week.  Blood pressure and everything else looks good.  Discussed dietary changes and chronic medical issues including diabetes. Follow up plan: Return in about 3  months (around 07/06/2023), or if symptoms worsen or fail to improve, for Diabetes and thyroid and cholesterol.  Counseling provided for all of the vaccine components Orders Placed This Encounter  Procedures   Bayer DCA Hb A1c Waived    Arville Care, MD [facility] 04/06/2023, 3:31 PM

## 2023-04-07 ENCOUNTER — Ambulatory Visit (INDEPENDENT_AMBULATORY_CARE_PROVIDER_SITE_OTHER): Payer: Medicare Other

## 2023-04-07 DIAGNOSIS — N1831 Chronic kidney disease, stage 3a: Secondary | ICD-10-CM | POA: Diagnosis not present

## 2023-04-07 DIAGNOSIS — E1169 Type 2 diabetes mellitus with other specified complication: Secondary | ICD-10-CM

## 2023-04-07 DIAGNOSIS — E0822 Diabetes mellitus due to underlying condition with diabetic chronic kidney disease: Secondary | ICD-10-CM | POA: Diagnosis not present

## 2023-04-07 DIAGNOSIS — Z794 Long term (current) use of insulin: Secondary | ICD-10-CM

## 2023-04-07 DIAGNOSIS — E119 Type 2 diabetes mellitus without complications: Secondary | ICD-10-CM

## 2023-04-07 LAB — HM DIABETES EYE EXAM

## 2023-04-07 NOTE — Progress Notes (Signed)
Joseph [name] arrived 04/07/2023 and has given verbal consent to obtain images and complete their overdue diabetic retinal screening.  The images have been sent to an ophthalmologist or optometrist for review and interpretation.  Results will be sent back to Joseph [name], Joseph Radon, MD for review.  Patient has been informed they will be contacted when we receive the results via telephone or MyChart

## 2023-05-09 DIAGNOSIS — K5900 Constipation, unspecified: Secondary | ICD-10-CM | POA: Diagnosis not present

## 2023-05-09 DIAGNOSIS — M47817 Spondylosis without myelopathy or radiculopathy, lumbosacral region: Secondary | ICD-10-CM | POA: Diagnosis not present

## 2023-05-09 DIAGNOSIS — G894 Chronic pain syndrome: Secondary | ICD-10-CM | POA: Diagnosis not present

## 2023-05-09 DIAGNOSIS — E1142 Type 2 diabetes mellitus with diabetic polyneuropathy: Secondary | ICD-10-CM | POA: Diagnosis not present

## 2023-05-31 ENCOUNTER — Other Ambulatory Visit: Payer: Self-pay | Admitting: Family Medicine

## 2023-05-31 DIAGNOSIS — J439 Emphysema, unspecified: Secondary | ICD-10-CM

## 2023-06-09 DIAGNOSIS — E1142 Type 2 diabetes mellitus with diabetic polyneuropathy: Secondary | ICD-10-CM | POA: Diagnosis not present

## 2023-06-09 DIAGNOSIS — M47817 Spondylosis without myelopathy or radiculopathy, lumbosacral region: Secondary | ICD-10-CM | POA: Diagnosis not present

## 2023-06-09 DIAGNOSIS — K5900 Constipation, unspecified: Secondary | ICD-10-CM | POA: Diagnosis not present

## 2023-06-09 DIAGNOSIS — G894 Chronic pain syndrome: Secondary | ICD-10-CM | POA: Diagnosis not present

## 2023-06-21 ENCOUNTER — Other Ambulatory Visit: Payer: Self-pay | Admitting: Family Medicine

## 2023-06-23 ENCOUNTER — Other Ambulatory Visit: Payer: Self-pay | Admitting: Family Medicine

## 2023-06-23 DIAGNOSIS — E1169 Type 2 diabetes mellitus with other specified complication: Secondary | ICD-10-CM

## 2023-06-23 DIAGNOSIS — E1159 Type 2 diabetes mellitus with other circulatory complications: Secondary | ICD-10-CM

## 2023-06-23 DIAGNOSIS — E782 Mixed hyperlipidemia: Secondary | ICD-10-CM

## 2023-06-25 ENCOUNTER — Other Ambulatory Visit: Payer: Self-pay | Admitting: Family Medicine

## 2023-06-25 DIAGNOSIS — J439 Emphysema, unspecified: Secondary | ICD-10-CM

## 2023-07-02 ENCOUNTER — Other Ambulatory Visit: Payer: Self-pay | Admitting: Family Medicine

## 2023-07-07 ENCOUNTER — Encounter: Payer: Self-pay | Admitting: Family Medicine

## 2023-07-07 ENCOUNTER — Ambulatory Visit: Payer: Medicare Other | Admitting: Family Medicine

## 2023-07-07 VITALS — BP 178/99 | HR 104 | Temp 97.4°F | Ht 74.0 in | Wt 247.0 lb

## 2023-07-07 DIAGNOSIS — E039 Hypothyroidism, unspecified: Secondary | ICD-10-CM

## 2023-07-07 DIAGNOSIS — R3911 Hesitancy of micturition: Secondary | ICD-10-CM | POA: Diagnosis not present

## 2023-07-07 DIAGNOSIS — I152 Hypertension secondary to endocrine disorders: Secondary | ICD-10-CM | POA: Diagnosis not present

## 2023-07-07 DIAGNOSIS — G4489 Other headache syndrome: Secondary | ICD-10-CM | POA: Diagnosis not present

## 2023-07-07 DIAGNOSIS — E1159 Type 2 diabetes mellitus with other circulatory complications: Secondary | ICD-10-CM

## 2023-07-07 DIAGNOSIS — E1169 Type 2 diabetes mellitus with other specified complication: Secondary | ICD-10-CM | POA: Diagnosis not present

## 2023-07-07 DIAGNOSIS — N1831 Chronic kidney disease, stage 3a: Secondary | ICD-10-CM | POA: Diagnosis not present

## 2023-07-07 DIAGNOSIS — N401 Enlarged prostate with lower urinary tract symptoms: Secondary | ICD-10-CM | POA: Diagnosis not present

## 2023-07-07 DIAGNOSIS — E785 Hyperlipidemia, unspecified: Secondary | ICD-10-CM

## 2023-07-07 DIAGNOSIS — Z794 Long term (current) use of insulin: Secondary | ICD-10-CM | POA: Diagnosis not present

## 2023-07-07 DIAGNOSIS — E0822 Diabetes mellitus due to underlying condition with diabetic chronic kidney disease: Secondary | ICD-10-CM | POA: Diagnosis not present

## 2023-07-07 LAB — BAYER DCA HB A1C WAIVED: HB A1C (BAYER DCA - WAIVED): 9.2 % — ABNORMAL HIGH (ref 4.8–5.6)

## 2023-07-07 MED ORDER — RIZATRIPTAN BENZOATE 10 MG PO TABS: 10.0000 mg | ORAL_TABLET | ORAL | 2 refills | Status: DC | PRN

## 2023-07-07 MED ORDER — DAPAGLIFLOZIN PROPANEDIOL 10 MG PO TABS: 10.0000 mg | ORAL_TABLET | Freq: Every day | ORAL | Status: DC

## 2023-07-07 MED ORDER — DAPAGLIFLOZIN PROPANEDIOL 10 MG PO TABS: 10.0000 mg | ORAL_TABLET | Freq: Every day | ORAL | 5 refills | Status: DC

## 2023-07-07 NOTE — Progress Notes (Signed)
BP (!) 178/99   Pulse (!) 104   Temp (!) 97.4 F (36.3 C)   Ht 6\' 2"  (1.88 m)   Wt 247 lb (112 kg)   SpO2 94%   BMI 31.71 kg/m    Subjective:   Patient ID: Joseph [name], male    DOB: 1961/03/08, 62 y.o.   MRN: [id number]  HPI: Joseph [name] is a 62 y.o. male presenting on 07/07/2023 for Medical Management of Chronic Issues  Type 2 diabetes mellitus Patient comes in today for recheck of his diabetes. He is not currently taking any medications because he was unable to tolerate metformin due to GI issues. He started on the 500 mg twice daily dose, and he had diarrhea for 3 weeks. Patient completed his diabetic eye exam in office, which showed no diabetic retinopathy. Patient denies any new issues with their feet. His diabetes is complicated by hyperlipidemia.   Hypothyroidism recheck Patient is currently taking levothyroxine 150 mcg. He denies any changes in hair or skin, heat or cold intolerance, diarrhea or constipation, and chest pain or palpitations.   Hyperlipidemia Patient is currently taking fenofibrate and Crestor. He denies myalgias or weakness. He does not have a history of liver damage from it.   Hypertension Patient is not currently taking any medications. His blood pressure today is 149/82. Blood pressure at home averages around 140-150s systolic. He denies lightheadedness or dizziness, vision changes, chest pain, or shortness of breath.    Relevant past medical, surgical, family and social history reviewed and updated as indicated. Interim medical history since our last visit reviewed. Allergies and medications reviewed and updated.  Review of Systems  Constitutional:  Negative for chills and fever.  HENT:  Negative for congestion, sinus pressure and sore throat.   Eyes:  Negative for visual disturbance.  Respiratory:  Negative for cough, chest tightness and shortness of breath.   Cardiovascular:  Negative for chest pain, palpitations and leg swelling.   Gastrointestinal:  Negative for abdominal pain, constipation and diarrhea.  Endocrine: Negative for cold intolerance and heat intolerance.  Genitourinary:  Negative for difficulty urinating, dysuria and hematuria.  Musculoskeletal:  Negative for myalgias.  Neurological:  Positive for headaches (takes Rizatriptan). Negative for dizziness, weakness, light-headedness and numbness.    Per HPI unless specifically indicated above   Allergies as of 07/07/2023   No Known Allergies      Medication List        Accurate as of July 07, 2023  3:49 PM. If you have any questions, ask your nurse or doctor.          STOP taking these medications    metFORMIN 1000 MG tablet Commonly known as: GLUCOPHAGE Stopped by: Elige Radon Isiah Scheel       TAKE these medications    Accu-Chek Aviva Plus test strip Generic drug: glucose blood Use as instructed   albuterol 108 (90 Base) MCG/ACT inhaler Commonly known as: VENTOLIN HFA INHALE 1-2 PUFFS BY MOUTH EVERY 6 HOURS AS NEEDED FOR WHEEZE OR SHORTNESS OF BREATH   Amitiza 24 MCG capsule Generic drug: lubiprostone Take 24 mcg by mouth 2 (two) times daily.   ARIPiprazole 2 MG tablet Commonly known as: ABILIFY Take 1 tablet (2 mg total) by mouth at bedtime.   cetirizine 10 MG tablet Commonly known as: ZYRTEC Take 1 tablet (10 mg total) by mouth daily.   dapagliflozin propanediol 10 MG Tabs tablet Commonly known as: Farxiga Take 1 tablet (10 mg total) by mouth daily  before breakfast. Started by: Elige Radon Ahliyah Nienow   fenofibrate 160 MG tablet Take 1 tablet (160 mg total) by mouth daily.   finasteride 5 MG tablet Commonly known as: PROSCAR TAKE 1 TABLET (5 MG TOTAL) BY MOUTH DAILY.   hydrOXYzine 25 MG capsule Commonly known as: VISTARIL Take 25-50 mg by mouth at bedtime as needed for anxiety (insomnia).   levothyroxine 150 MCG tablet Commonly known as: SYNTHROID TAKE 1 TABLET BY MOUTH EVERY DAY   mirtazapine 45 MG  tablet Commonly known as: REMERON Take 1 tablet (45 mg total) by mouth at bedtime.   morphine 30 MG 12 hr tablet Commonly known as: MS CONTIN Take 30 mg by mouth every 8 (eight) hours.   oxyCODONE-acetaminophen 7.5-325 MG tablet Commonly known as: PERCOCET TAKE 1 TABLET BY MOUTH EVERY FOUR HOURS AS NEEDED FOR PAIN   pantoprazole 20 MG tablet Commonly known as: PROTONIX Take 1 tablet (20 mg total) by mouth daily.   pregabalin 75 MG capsule Commonly known as: LYRICA Take 75 mg by mouth 3 (three) times daily.   rizatriptan 10 MG tablet Commonly known as: MAXALT Take 1 tablet (10 mg total) by mouth as needed for migraine. May repeat in 2 hours if needed What changed: See the new instructions. Changed by: Elige Radon Sabiha Sura   rosuvastatin 10 MG tablet Commonly known as: CRESTOR TAKE 1 TABLET BY MOUTH EVERY DAY   sertraline 100 MG tablet Commonly known as: ZOLOFT Take 1 tablet (100 mg total) by mouth daily.   Spiriva Respimat 2.5 MCG/ACT Aers Generic drug: Tiotropium Bromide Monohydrate Inhale 2 puffs into the lungs daily.   tamsulosin 0.4 MG Caps capsule Commonly known as: FLOMAX Take 1 capsule (0.4 mg total) by mouth daily.   tiZANidine 2 MG tablet Commonly known as: ZANAFLEX Take 2 mg by mouth 3 (three) times daily as needed for muscle spasms.   triamcinolone cream 0.1 % Commonly known as: KENALOG Apply 1 Application topically 2 (two) times daily.   Vitamin D3 125 MCG (5000 UT) Caps Take 5,000 Units by mouth.         Objective:   BP (!) 178/99   Pulse (!) 104   Temp (!) 97.4 F (36.3 C)   Ht 6\' 2"  (1.88 m)   Wt 247 lb (112 kg)   SpO2 94%   BMI 31.71 kg/m   Wt Readings from Last 3 Encounters:  07/07/23 247 lb (112 kg)  04/06/23 244 lb 12.8 oz (111 kg)  03/07/23 225 lb (102.1 kg)    Physical Exam Vitals and nursing note reviewed.  Constitutional:      General: He is not in acute distress.    Appearance: Normal appearance. He is obese.  HENT:      Head: Normocephalic and atraumatic.     Right Ear: External ear normal.     Left Ear: External ear normal.  Eyes:     Conjunctiva/sclera: Conjunctivae normal.  Neck:     Comments: Gauze covering tracheostomy  Cardiovascular:     Rate and Rhythm: Normal rate and regular rhythm.     Heart sounds: Normal heart sounds.  Pulmonary:     Effort: Pulmonary effort is normal.     Breath sounds: Normal breath sounds. No wheezing or rales.  Abdominal:     General: Abdomen is flat. There is no distension.     Palpations: Abdomen is soft.     Tenderness: There is no abdominal tenderness.  Musculoskeletal:     Cervical back:  Neck supple. No tenderness.     Right lower leg: No edema.     Left lower leg: No edema.  Skin:    General: Skin is warm and dry.  Neurological:     Mental Status: He is alert and oriented to person, place, and time.  Psychiatric:        Mood and Affect: Mood normal.        Behavior: Behavior normal.        Thought Content: Thought content normal.        Judgment: Judgment normal.     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   Relevant Medications   dapagliflozin propanediol (FARXIGA) 10 MG TABS tablet   dapagliflozin propanediol (FARXIGA) 10 MG TABS tablet   Other Relevant Orders   Bayer DCA Hb A1c Waived (Completed)   AMB Referral VBCI Care Management   Hyperlipidemia associated with type 2 diabetes mellitus (HCC)   Relevant Medications   dapagliflozin propanediol  (FARXIGA) 10 MG TABS tablet   dapagliflozin propanediol (FARXIGA) 10 MG TABS tablet   Other Relevant Orders   CBC with Differential/Platelet (Completed)   CMP14+EGFR (Completed)   Lipid panel (Completed)     Other   Headache   Relevant Medications   rizatriptan (MAXALT) 10 MG tablet   Other Visit Diagnoses       Benign prostatic hyperplasia with urinary hesitancy       Relevant Orders   PSA, total and free (Completed)       Blood pressure elevated at 149/82. Patient is also having elevated readings at home, but he generally takes his blood pressure shortly after sitting. Will have patient check his blood pressure daily for the next two weeks with proper technique (sitting for 5 minutes in a chair with back support, feet flat on the floor, and arm rested at heart level) and have him notify us with the numbers. Diabetes poorly controlled with HgbA1c 9.2%. Will start Farxiga to help with both diabetes and hypertension. Will also recheck TSH and lipid panel today.   Follow up plan: Return in about 3 months (around 10/05/2023), or if symptoms worsen or fail to improve, for Diabetes and thyroid.  Counseling provided for all of the vaccine components Orders Placed This Encounter  Procedures   CBC with Differential/Platelet   CMP14+EGFR   Lipid panel   TSH   Bayer DCA Hb A1c Waived   PSA, total and free   AMB Referral VBCI Care Management    Gillermina Phy, Medical Student [facility] 07/07/2023, 3:49 PM  I was personally present for all components of the history, physical exam and/or medical decision making.  I agree with the documentation performed by the student and agree with assessment and plan above.  Student was Gillermina Phy. Arville Care, MD [facility] 07/14/2023, 12:07 PM

## 2023-07-08 LAB — CMP14+EGFR
ALT: 29 [IU]/L (ref 0–44)
AST: 23 [IU]/L (ref 0–40)
Albumin: 4.2 g/dL (ref 3.9–4.9)
Alkaline Phosphatase: 42 [IU]/L — ABNORMAL LOW (ref 44–121)
BUN/Creatinine Ratio: 14 (ref 10–24)
BUN: 17 mg/dL (ref 8–27)
Bilirubin Total: 0.2 mg/dL (ref 0.0–1.2)
CO2: 25 mmol/L (ref 20–29)
Calcium: 9.2 mg/dL (ref 8.6–10.2)
Chloride: 96 mmol/L (ref 96–106)
Creatinine, Ser: 1.24 mg/dL (ref 0.76–1.27)
Globulin, Total: 2.6 g/dL (ref 1.5–4.5)
Glucose: 293 mg/dL — ABNORMAL HIGH (ref 70–99)
Potassium: 4.3 mmol/L (ref 3.5–5.2)
Sodium: 137 mmol/L (ref 134–144)
Total Protein: 6.8 g/dL (ref 6.0–8.5)
eGFR: 66 mL/min/{1.73_m2} (ref >59)

## 2023-07-08 LAB — CBC WITH DIFFERENTIAL/PLATELET
Basophils Absolute: 0.1 10*3/uL (ref 0.0–0.2)
Basos: 1 %
EOS (ABSOLUTE): 0.1 10*3/uL (ref 0.0–0.4)
Eos: 2 %
Hematocrit: 43.8 % (ref 37.5–51.0)
Hemoglobin: 14 g/dL (ref 13.0–17.7)
Immature Grans (Abs): 0 10*3/uL (ref 0.0–0.1)
Immature Granulocytes: 0 %
Lymphocytes Absolute: 1.9 10*3/uL (ref 0.7–3.1)
Lymphs: 31 %
MCH: 27.6 pg (ref 26.6–33.0)
MCHC: 32 g/dL (ref 31.5–35.7)
MCV: 86 fL (ref 79–97)
Monocytes Absolute: 0.5 10*3/uL (ref 0.1–0.9)
Monocytes: 8 %
Neutrophils Absolute: 3.5 10*3/uL (ref 1.4–7.0)
Neutrophils: 58 %
Platelets: 177 10*3/uL (ref 150–450)
RBC: 5.07 x10E6/uL (ref 4.14–5.80)
RDW: 12.3 % (ref 11.6–15.4)
WBC: 6 10*3/uL (ref 3.4–10.8)

## 2023-07-08 LAB — TSH: TSH: 2.3 u[IU]/mL (ref 0.450–4.500)

## 2023-07-08 LAB — LIPID PANEL
Chol/HDL Ratio: 4.5 {ratio} (ref 0.0–5.0)
Cholesterol, Total: 184 mg/dL (ref 100–199)
HDL: 41 mg/dL (ref >39)
LDL Chol Calc (NIH): 83 mg/dL (ref 0–99)
Triglycerides: 373 mg/dL — ABNORMAL HIGH (ref 0–149)
VLDL Cholesterol Cal: 60 mg/dL — ABNORMAL HIGH (ref 5–40)

## 2023-07-08 LAB — PSA, TOTAL AND FREE
PSA, Free: <0.02 ng/mL
Prostate Specific Ag, Serum: <0.1 ng/mL (ref 0.0–4.0)

## 2023-07-12 ENCOUNTER — Telehealth: Payer: Self-pay

## 2023-07-12 NOTE — Progress Notes (Signed)
Care Guide Pharmacy Note  07/12/2023 Name: [name] MRN: [id number] DOB: [birth year]  Referred By: Dettinger, Elige Radon, MD Reason for referral: Care Coordination (Outreach to schedule with Pharm d )   Joseph [name] is a 62 y.o. year old male who is a primary care patient of Dettinger, Elige Radon, MD.  Joseph [name] was referred to the pharmacist for assistance related to: HTN  An unsuccessful telephone outreach was attempted today to contact the patient who was referred to the pharmacy team for assistance with medication management. Additional attempts will be made to contact the patient.  Joseph [name] , RMA     [facility]  [facility], [facility] Guide  Direct Dial: [phone]  Website: [email or web address]

## 2023-07-15 NOTE — Progress Notes (Signed)
Care Guide Pharmacy Note  07/15/2023 Name: MERCER RUSSAW MRN: [id number] DOB: [birth year]  Referred By: Dettinger, Elige Radon, MD Reason for referral: Care Coordination (Outreach to schedule with Pharm d )   ROBBI GONZALO is a 62 y.o. year old male who is a primary care patient of Dettinger, Elige Radon, MD.  [name] was referred to the pharmacist for assistance related to: DMII  Successful contact was made with the patient to discuss pharmacy services including being ready for the pharmacist to call at least 5 minutes before the scheduled appointment time and to have medication bottles and any blood pressure readings ready for review. The patient agreed to meet with the pharmacist via telephone visit on (date/time).08/18/2023  Penne Lash , RMA     [place]  [facility], [facility] Guide  Direct Dial: [phone]  Website: [email or web address]

## 2023-07-21 ENCOUNTER — Telehealth (HOSPITAL_COMMUNITY): Payer: Medicare Other | Admitting: Psychiatry

## 2023-07-26 ENCOUNTER — Telehealth (HOSPITAL_COMMUNITY): Payer: Medicare Other | Admitting: Psychiatry

## 2023-07-26 ENCOUNTER — Encounter (HOSPITAL_COMMUNITY): Payer: Self-pay | Admitting: Psychiatry

## 2023-07-26 ENCOUNTER — Other Ambulatory Visit: Payer: Self-pay | Admitting: Family Medicine

## 2023-07-26 DIAGNOSIS — F064 Anxiety disorder due to known physiological condition: Secondary | ICD-10-CM

## 2023-07-26 DIAGNOSIS — G4709 Other insomnia: Secondary | ICD-10-CM

## 2023-07-26 DIAGNOSIS — F331 Major depressive disorder, recurrent, moderate: Secondary | ICD-10-CM | POA: Diagnosis not present

## 2023-07-26 DIAGNOSIS — Z79899 Other long term (current) drug therapy: Secondary | ICD-10-CM

## 2023-07-26 DIAGNOSIS — J439 Emphysema, unspecified: Secondary | ICD-10-CM

## 2023-07-26 MED ORDER — MIRTAZAPINE 45 MG PO TABS: 45.0000 mg | ORAL_TABLET | Freq: Every day | ORAL | 1 refills | Status: DC

## 2023-07-26 MED ORDER — ARIPIPRAZOLE 2 MG PO TABS: 2.0000 mg | ORAL_TABLET | Freq: Every evening | ORAL | 1 refills | Status: DC

## 2023-07-26 MED ORDER — SERTRALINE HCL 100 MG PO TABS: 100.0000 mg | ORAL_TABLET | Freq: Every day | ORAL | 1 refills | Status: DC
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
## 2023-07-26 NOTE — Patient Instructions (Signed)
 We did not make any medication changes today.  Keep up the good work with getting outside and enjoying life where you can.  I will message your PCP about getting an updated EKG while you are on the Abilify  medication so if you can also message him to get scheduled that would be good.

## 2023-08-08 DIAGNOSIS — M47817 Spondylosis without myelopathy or radiculopathy, lumbosacral region: Secondary | ICD-10-CM | POA: Diagnosis not present

## 2023-08-08 DIAGNOSIS — G894 Chronic pain syndrome: Secondary | ICD-10-CM | POA: Diagnosis not present

## 2023-08-08 DIAGNOSIS — K5900 Constipation, unspecified: Secondary | ICD-10-CM | POA: Diagnosis not present

## 2023-08-08 DIAGNOSIS — E1142 Type 2 diabetes mellitus with diabetic polyneuropathy: Secondary | ICD-10-CM | POA: Diagnosis not present

## 2023-08-16 ENCOUNTER — Other Ambulatory Visit: Payer: Self-pay | Admitting: Family Medicine

## 2023-08-18 ENCOUNTER — Other Ambulatory Visit (INDEPENDENT_AMBULATORY_CARE_PROVIDER_SITE_OTHER): Payer: Medicare Other

## 2023-08-18 DIAGNOSIS — Z7984 Long term (current) use of oral hypoglycemic drugs: Secondary | ICD-10-CM

## 2023-08-18 DIAGNOSIS — E119 Type 2 diabetes mellitus without complications: Secondary | ICD-10-CM

## 2023-08-18 NOTE — Progress Notes (Signed)
08/18/2023 Name: Joseph [name] MRN: [id number] DOB: [birth year]  Chief Complaint  Patient presents with   Hypertension    Joseph [name] is a 63 y.o. year old male who presented for a telephone visit.  I connected with  [name] on 08/18/23 by telephone and verified that I am speaking with the correct person using two identifiers. I discussed the limitations of evaluation and management by telemedicine. The patient expressed understanding and agreed to proceed.  Patient was located in her home and PharmD in PCP office during this visit.   They were referred to the pharmacist by their PCP for assistance in managing hypertension and complex medication management.    Subjective: Patient's BP was elevated to 180s/99 at last PCP visit.  He reports he is doing much better with addition of farxiga to his T2DM regimen (receiving BP lowering effects as well)   Care Team: Primary Care Provider: Dettinger, Elige Radon, MD ; Next Scheduled Visit: 09/2023   Medication Access/Adherence  Current Pharmacy:  [facility] [phone] - [place], [place] - [address] [place] [address] Phone: [phone] Fax: [phone]   Patient reports affordability concerns with their medications: No  Patient reports access/transportation concerns to their pharmacy: No  Patient reports adherence concerns with their medications:  No     Hypertension:  Current medications: BP was elevated at last PCP visit but now normalized with addition of farxiga to T2DM Medications previously tried: lisinopril--stopeed due to dizziness   Patient has a validated, automated, upper arm home BP cuff Current blood pressure readings readings: 130/88, 115/68, 114/74  Patient denies hypotensive s/sx including dizziness, lightheadedness.  Patient denies hypertensive symptoms including headache, chest pain, shortness of breath  Current meal patterns: encouraged heart healthy low  sale  Current physical activity: encouraged as able   Objective:  Lab Results  Component Value Date   HGBA1C 9.2 (H) 07/07/2023    Lab Results  Component Value Date   CREATININE 1.24 07/07/2023   BUN 17 07/07/2023   NA 137 07/07/2023   K 4.3 07/07/2023   CL 96 07/07/2023   CO2 25 07/07/2023    Lab Results  Component Value Date   CHOL 184 07/07/2023   HDL 41 07/07/2023   LDLCALC 83 07/07/2023   TRIG 373 (H) 07/07/2023   CHOLHDL 4.5 07/07/2023    Medications Reviewed Today     Reviewed by Danella Maiers, [facility] (Pharmacist) on 08/18/23 at 1120  Med List Status: <None>   Medication Order Taking? Sig Documenting Provider Last Dose Status Informant  albuterol (VENTOLIN HFA) 108 (90 Base) MCG/ACT inhaler [id number]  INHALE 1-2 PUFFS BY MOUTH EVERY 6 HOURS AS NEEDED FOR WHEEZE OR SHORTNESS OF BREATH Dettinger, Elige Radon, MD  Active   AMITIZA 24 MCG capsule [id number] No Take 24 mcg by mouth 2 (two) times daily. [provider] Taking Active   ARIPiprazole (ABILIFY) 2 MG tablet [id number]  Take 1 tablet (2 mg total) by mouth at bedtime. Elsie Lincoln, MD  Active   Cholecalciferol (VITAMIN D3) 5000 units CAPS [id number] No Take 5,000 Units by mouth. [provider] Taking Active   dapagliflozin propanediol (FARXIGA) 10 MG TABS tablet [id number]  Take 1 tablet (10 mg total) by mouth daily before breakfast. Dettinger, Elige Radon, MD  Active     Discontinued 08/18/23 1119   fenofibrate 160 MG tablet [id number] No Take 1 tablet (160 mg total) by mouth daily. Dettinger, Elige Radon, MD Taking  Active   finasteride (PROSCAR) 5 MG tablet [id number]  TAKE 1 TABLET (5 MG TOTAL) BY MOUTH DAILY. Dettinger, Elige Radon, MD  Active   glucose blood (ACCU-CHEK AVIVA PLUS) test strip [id number] No Use as instructed Dettinger, Elige Radon, MD Taking Active   hydrOXYzine (VISTARIL) 25 MG capsule [id number] No Take 25-50 mg by mouth at bedtime as needed for anxiety (insomnia). [provider]  Taking Active   levothyroxine (SYNTHROID) 150 MCG tablet [id number] No TAKE 1 TABLET BY MOUTH EVERY DAY Dettinger, Elige Radon, MD Taking Active   mirtazapine (REMERON) 45 MG tablet [id number]  Take 1 tablet (45 mg total) by mouth at bedtime. Elsie Lincoln, MD  Active   morphine (MS CONTIN) 30 MG 12 hr tablet [id number] No Take 30 mg by mouth every 8 (eight) hours. [provider] Taking Active   oxyCODONE-acetaminophen (PERCOCET) 7.5-325 MG tablet [id number] No TAKE 1 TABLET BY MOUTH EVERY FOUR HOURS AS NEEDED FOR PAIN [provider] Taking Active   pantoprazole (PROTONIX) 20 MG tablet [id number] No Take 1 tablet (20 mg total) by mouth daily. Dettinger, Elige Radon, MD Taking Active   pregabalin (LYRICA) 75 MG capsule [id number] No Take 75 mg by mouth 3 (three) times daily. [provider] Taking Active   rizatriptan (MAXALT) 10 MG tablet [id number]  Take 1 tablet (10 mg total) by mouth as needed for migraine. May repeat in 2 hours if needed Dettinger, Elige Radon, MD  Active   rosuvastatin (CRESTOR) 10 MG tablet [id number] No TAKE 1 TABLET BY MOUTH EVERY DAY Dettinger, Elige Radon, MD Taking Active   sertraline (ZOLOFT) 100 MG tablet [id number]  Take 1 tablet (100 mg total) by mouth daily. Elsie Lincoln, MD  Active   tamsulosin [facility]) 0.4 MG CAPS capsule [id number] No Take 1 capsule (0.4 mg total) by mouth daily. Dettinger, Elige Radon, MD Taking Active   Tiotropium Bromide Monohydrate (SPIRIVA RESPIMAT) 2.5 MCG/ACT AERS [id number] No Inhale 2 puffs into the lungs daily. Dettinger, Elige Radon, MD Taking Active   tiZANidine (ZANAFLEX) 2 MG tablet [id number] No Take 2 mg by mouth 3 (three) times daily as needed for muscle spasms. [provider] Taking Active             Assessment/Plan:   Hypertension: - Currently controlled - Reviewed long term cardiovascular and renal outcomes of uncontrolled blood pressure - Reviewed appropriate blood pressure monitoring technique and  reviewed goal blood pressure. Recommended to check home blood pressure and heart rate daily or if symptomatic - Recommend to continue meds as prescribed   Diabetes: Patient reports his BG is better, butdoes not often check.  He does not wish to add anything else to his medication regimen at this time Will check A1c at f/u in March with PCP   Currently on spirivia, but could likely benefit from [facility] administers via trach, could use spacer, but no samples today   Follow Up Plan: 09/2023   Kieth Brightly, PharmD, BCACP, CPP Clinical Pharmacist, [facility]

## 2023-08-28 ENCOUNTER — Other Ambulatory Visit: Payer: Self-pay | Admitting: Family Medicine

## 2023-08-28 DIAGNOSIS — E782 Mixed hyperlipidemia: Secondary | ICD-10-CM

## 2023-08-28 DIAGNOSIS — E1159 Type 2 diabetes mellitus with other circulatory complications: Secondary | ICD-10-CM

## 2023-08-28 DIAGNOSIS — E1169 Type 2 diabetes mellitus with other specified complication: Secondary | ICD-10-CM

## 2023-08-31 ENCOUNTER — Telehealth: Payer: Self-pay | Admitting: Family Medicine

## 2023-08-31 NOTE — Telephone Encounter (Signed)
Will watch for form

## 2023-08-31 NOTE — Telephone Encounter (Signed)
Copied from CRM [id number]. Topic: General - Other >> Aug 31, 2023  3:24 PM Antony Haste wrote: Reason for CRM: [name] wife, Laveda Abbe has filled out a document release form for [employer] indicating that he has been diagnosed with diabetes. She is requesting if Dr. Louanne Skye can sign this form. She states the fax number is on the front page of the form, if this can be faxed back over to [employer]? She states she will stop by the office tomorrow morning to drop this off.

## 2023-09-02 ENCOUNTER — Telehealth: Payer: Self-pay

## 2023-09-02 NOTE — Telephone Encounter (Signed)
Form completed.

## 2023-09-02 NOTE — Telephone Encounter (Signed)
Pt dropped off BP readings. Per Dr. Louanne Skye numbers are good. Pt did have one low but not concerning. Pt states that he had been sick during that time and that he was " all out of wack". Advised pt to continue monitoring and let us know if he has any changes.  Dr. Louanne Skye also completed form " Chronic condition release form". It has been faxed to [phone]. Pt made aware and states that he does not need the original form. Original sent to be scanned.

## 2023-09-06 ENCOUNTER — Other Ambulatory Visit: Payer: Self-pay | Admitting: Family Medicine

## 2023-09-06 DIAGNOSIS — K219 Gastro-esophageal reflux disease without esophagitis: Secondary | ICD-10-CM

## 2023-09-06 DIAGNOSIS — E1159 Type 2 diabetes mellitus with other circulatory complications: Secondary | ICD-10-CM

## 2023-09-06 DIAGNOSIS — E782 Mixed hyperlipidemia: Secondary | ICD-10-CM

## 2023-09-06 DIAGNOSIS — E1169 Type 2 diabetes mellitus with other specified complication: Secondary | ICD-10-CM

## 2023-09-07 ENCOUNTER — Encounter (INDEPENDENT_AMBULATORY_CARE_PROVIDER_SITE_OTHER): Payer: Self-pay | Admitting: *Deleted

## 2023-09-07 DIAGNOSIS — E1142 Type 2 diabetes mellitus with diabetic polyneuropathy: Secondary | ICD-10-CM | POA: Diagnosis not present

## 2023-09-07 DIAGNOSIS — K5900 Constipation, unspecified: Secondary | ICD-10-CM | POA: Diagnosis not present

## 2023-09-07 DIAGNOSIS — M47817 Spondylosis without myelopathy or radiculopathy, lumbosacral region: Secondary | ICD-10-CM | POA: Diagnosis not present

## 2023-09-07 DIAGNOSIS — G894 Chronic pain syndrome: Secondary | ICD-10-CM | POA: Diagnosis not present

## 2023-09-30 ENCOUNTER — Other Ambulatory Visit: Payer: Self-pay | Admitting: Family Medicine

## 2023-09-30 DIAGNOSIS — E785 Hyperlipidemia, unspecified: Secondary | ICD-10-CM

## 2023-09-30 DIAGNOSIS — I152 Hypertension secondary to endocrine disorders: Secondary | ICD-10-CM

## 2023-09-30 DIAGNOSIS — E782 Mixed hyperlipidemia: Secondary | ICD-10-CM

## 2023-09-30 DIAGNOSIS — E1169 Type 2 diabetes mellitus with other specified complication: Secondary | ICD-10-CM

## 2023-09-30 DIAGNOSIS — E1159 Type 2 diabetes mellitus with other circulatory complications: Secondary | ICD-10-CM

## 2023-10-04 ENCOUNTER — Other Ambulatory Visit: Payer: Self-pay | Admitting: Family Medicine

## 2023-10-04 DIAGNOSIS — G4489 Other headache syndrome: Secondary | ICD-10-CM

## 2023-10-05 ENCOUNTER — Ambulatory Visit (INDEPENDENT_AMBULATORY_CARE_PROVIDER_SITE_OTHER): Payer: Medicare Other | Admitting: Family Medicine

## 2023-10-05 ENCOUNTER — Encounter: Payer: Self-pay | Admitting: Family Medicine

## 2023-10-05 VITALS — BP 171/93 | HR 83 | Ht 74.0 in | Wt 246.0 lb

## 2023-10-05 DIAGNOSIS — N1831 Chronic kidney disease, stage 3a: Secondary | ICD-10-CM | POA: Diagnosis not present

## 2023-10-05 DIAGNOSIS — K219 Gastro-esophageal reflux disease without esophagitis: Secondary | ICD-10-CM | POA: Diagnosis not present

## 2023-10-05 DIAGNOSIS — E039 Hypothyroidism, unspecified: Secondary | ICD-10-CM | POA: Diagnosis not present

## 2023-10-05 DIAGNOSIS — E0822 Diabetes mellitus due to underlying condition with diabetic chronic kidney disease: Secondary | ICD-10-CM | POA: Diagnosis not present

## 2023-10-05 DIAGNOSIS — E782 Mixed hyperlipidemia: Secondary | ICD-10-CM

## 2023-10-05 DIAGNOSIS — E1159 Type 2 diabetes mellitus with other circulatory complications: Secondary | ICD-10-CM | POA: Diagnosis not present

## 2023-10-05 DIAGNOSIS — Z794 Long term (current) use of insulin: Secondary | ICD-10-CM | POA: Diagnosis not present

## 2023-10-05 DIAGNOSIS — I152 Hypertension secondary to endocrine disorders: Secondary | ICD-10-CM | POA: Diagnosis not present

## 2023-10-05 DIAGNOSIS — E785 Hyperlipidemia, unspecified: Secondary | ICD-10-CM | POA: Diagnosis not present

## 2023-10-05 DIAGNOSIS — E1169 Type 2 diabetes mellitus with other specified complication: Secondary | ICD-10-CM | POA: Diagnosis not present

## 2023-10-05 LAB — BAYER DCA HB A1C WAIVED: HB A1C (BAYER DCA - WAIVED): 9.1 % — ABNORMAL HIGH (ref 4.8–5.6)

## 2023-10-05 MED ORDER — TOUJEO SOLOSTAR 300 UNIT/ML ~~LOC~~ SOPN
10.0000 [IU] | PEN_INJECTOR | Freq: Every day | SUBCUTANEOUS | 3 refills | Status: DC
Start: 2023-10-05 — End: 2024-01-06

## 2023-10-05 MED ORDER — PANTOPRAZOLE SODIUM 20 MG PO TBEC: 20.0000 mg | DELAYED_RELEASE_TABLET | Freq: Every day | ORAL | 3 refills | Status: DC

## 2023-10-05 MED ORDER — DAPAGLIFLOZIN PROPANEDIOL 10 MG PO TABS: 10.0000 mg | ORAL_TABLET | Freq: Every day | ORAL | 5 refills | Status: DC

## 2023-10-05 MED ORDER — FENOFIBRATE 160 MG PO TABS: 160.0000 mg | ORAL_TABLET | Freq: Every day | ORAL | 3 refills | Status: DC

## 2023-10-05 MED ORDER — FINASTERIDE 5 MG PO TABS
5.0000 mg | ORAL_TABLET | Freq: Every day | ORAL | 3 refills | Status: AC
Start: 2023-10-05 — End: ?

## 2023-10-05 MED ORDER — TRAZODONE HCL 300 MG PO TABS: 300.0000 mg | ORAL_TABLET | Freq: Every day | ORAL | 2 refills | Status: DC

## 2023-10-05 NOTE — Progress Notes (Signed)
 BP (!) 171/93   Pulse 83   Ht 6\' 2"  (1.88 m)   Wt 246 lb (111.6 kg)   SpO2 96%   BMI 31.58 kg/m    Subjective:   Patient ID: [name], male    DOB: 08/03/1960, 63 y.o.   MRN: [id number]  HPI: BLAKE VETRANO is a 63 y.o. male presenting on 10/05/2023 for Medical Management of Chronic Issues, Diabetes, Hyperlipidemia, Hypertension, and Hypothyroidism   HPI Type 2 diabetes mellitus Patient comes in today for recheck of his diabetes. Patient has been currently taking [place]. Patient is not currently on an ACE inhibitor/ARB. Patient has not seen an ophthalmologist this year. Patient denies any new issues with their feet. The symptom started onset as an adult hypertension and hypothyroidism and hyperlipidemia ARE RELATED TO DM   Hypertension Patient is currently on no medication currently, and their blood pressure today is 171/93, but very stressed out with dealing with the loss of his stepson and not sleeping well. Patient denies any lightheadedness or dizziness. Patient denies headaches, blurred vision, chest pains, shortness of breath, or weakness. Denies any side effects from medication and is content with current medication.   Hypothyroidism recheck Patient is coming in for thyroid recheck today as well. They deny any issues with hair changes or heat or cold problems or diarrhea or constipation. They deny any chest pain or palpitations. They are currently on levothyroxine 150 micrograms   Hyperlipidemia Patient is coming in for recheck of his hyperlipidemia. The patient is currently taking Crestor and fenofibrate. They deny any issues with myalgias or history of liver damage from it. They deny any focal numbness or weakness or chest pain.   Patient not doing as well with sleep since his stepson who he basically raised is his own son committing suicide just recently.  He does have a psychiatrist but he found out his psychiatrist is leaving.  He does have an appointment to see  him 1 more time before he leaves.  Relevant past medical, surgical, family and social history reviewed and updated as indicated. Interim medical history since our last visit reviewed. Allergies and medications reviewed and updated.  Review of Systems  Constitutional:  Negative for chills and fever.  Eyes:  Negative for discharge and visual disturbance.  Respiratory:  Negative for shortness of breath and wheezing.   Cardiovascular:  Negative for chest pain and leg swelling.  Musculoskeletal:  Negative for gait problem.  Skin:  Negative for rash.  Neurological:  Negative for dizziness and light-headedness.  Psychiatric/Behavioral:  Positive for sleep disturbance. Negative for decreased concentration and dysphoric mood. The patient is not nervous/anxious.   All other systems reviewed and are negative.   Per HPI unless specifically indicated above   Allergies as of 10/05/2023       Reactions   Lisinopril Other (See Comments)   Dizziness         Medication List        Accurate as of October 05, 2023  2:42 PM. If you have any questions, ask your nurse or doctor.          Accu-Chek Aviva Plus test strip Generic drug: glucose blood Use as instructed   albuterol 108 (90 Base) MCG/ACT inhaler Commonly known as: VENTOLIN HFA INHALE 1-2 PUFFS BY MOUTH EVERY 6 HOURS AS NEEDED FOR WHEEZE OR SHORTNESS OF BREATH   Amitiza 24 MCG capsule Generic drug: lubiprostone Take 24 mcg by mouth 2 (two) times daily.   ARIPiprazole  2 MG tablet Commonly known as: ABILIFY Take 1 tablet (2 mg total) by mouth at bedtime.   dapagliflozin propanediol 10 MG Tabs tablet Commonly known as: Farxiga Take 1 tablet (10 mg total) by mouth daily before breakfast.   fenofibrate 160 MG tablet Take 1 tablet (160 mg total) by mouth daily.   finasteride 5 MG tablet Commonly known as: PROSCAR Take 1 tablet (5 mg total) by mouth daily.   hydrOXYzine 25 MG capsule Commonly known as: VISTARIL Take 25-50  mg by mouth at bedtime as needed for anxiety (insomnia).   levothyroxine 150 MCG tablet Commonly known as: SYNTHROID TAKE 1 TABLET BY MOUTH EVERY DAY   mirtazapine 45 MG tablet Commonly known as: REMERON Take 1 tablet (45 mg total) by mouth at bedtime.   morphine 30 MG 12 hr tablet Commonly known as: MS CONTIN Take 30 mg by mouth every 8 (eight) hours.   oxyCODONE-acetaminophen 7.5-325 MG tablet Commonly known as: PERCOCET TAKE 1 TABLET BY MOUTH EVERY FOUR HOURS AS NEEDED FOR PAIN   pantoprazole 20 MG tablet Commonly known as: PROTONIX Take 1 tablet (20 mg total) by mouth daily.   pregabalin 75 MG capsule Commonly known as: LYRICA Take 75 mg by mouth 3 (three) times daily.   rizatriptan 10 MG tablet Commonly known as: MAXALT TAKE 1 TABLET BY MOUTH AS NEEDED FOR MIGRAINE. MAY REPEAT IN 2 HOURS IF NEEDED   rosuvastatin 10 MG tablet Commonly known as: CRESTOR TAKE 1 TABLET BY MOUTH EVERY DAY   sertraline 100 MG tablet Commonly known as: ZOLOFT Take 1 tablet (100 mg total) by mouth daily.   Spiriva Respimat 2.5 MCG/ACT Aers Generic drug: Tiotropium Bromide Monohydrate Inhale 2 puffs into the lungs daily.   tamsulosin 0.4 MG Caps capsule Commonly known as: FLOMAX Take 1 capsule (0.4 mg total) by mouth daily.   tiZANidine 2 MG tablet Commonly known as: ZANAFLEX Take 2 mg by mouth 3 (three) times daily as needed for muscle spasms.   Toujeo SoloStar 300 UNIT/ML Solostar Pen Generic drug: insulin glargine (1 Unit Dial) Inject 10 Units into the skin daily at 12 noon. Started by: Elige Radon Lidwina Kaner   trazodone 300 MG tablet Commonly known as: DESYREL Take 1 tablet (300 mg total) by mouth at bedtime. Started by: Elige Radon Oaklie Durrett   Vitamin D3 125 MCG (5000 UT) Caps Take 5,000 Units by mouth.         Objective:   BP (!) 171/93   Pulse 83   Ht 6\' 2"  (1.88 m)   Wt 246 lb (111.6 kg)   SpO2 96%   BMI 31.58 kg/m   Wt Readings from Last 3 Encounters:   10/05/23 246 lb (111.6 kg)  07/07/23 247 lb (112 kg)  04/06/23 244 lb 12.8 oz (111 kg)    Physical Exam Vitals and nursing note reviewed.  Constitutional:      General: He is not in acute distress.    Appearance: He is well-developed. He is not diaphoretic.  Eyes:     General: No scleral icterus.    Conjunctiva/sclera: Conjunctivae normal.  Neck:     Thyroid: No thyromegaly.  Cardiovascular:     Rate and Rhythm: Normal rate and regular rhythm.     Heart sounds: Normal heart sounds. No murmur heard. Pulmonary:     Effort: Pulmonary effort is normal. No respiratory distress.     Breath sounds: Normal breath sounds. No wheezing.  Musculoskeletal:        General:  No swelling. Normal range of motion.     Cervical back: Neck supple.  Lymphadenopathy:     Cervical: No cervical adenopathy.  Skin:    General: Skin is warm and dry.     Findings: No rash.  Neurological:     Mental Status: He is alert and oriented to person, place, and time.     Coordination: Coordination normal.  Psychiatric:        Mood and Affect: Mood is anxious and depressed.        Behavior: Behavior normal.       Assessment & Plan:   Problem List Items Addressed This Visit       Cardiovascular and Mediastinum   Hypertension associated with diabetes (HCC)   Relevant Medications   dapagliflozin propanediol (FARXIGA) 10 MG TABS tablet   fenofibrate 160 MG tablet   insulin glargine, 1 Unit Dial, (TOUJEO SOLOSTAR) 300 UNIT/ML Solostar Pen     Endocrine   Hypothyroidism   Diabetes mellitus with chronic kidney disease (HCC)   Relevant Medications   dapagliflozin propanediol (FARXIGA) 10 MG TABS tablet   insulin glargine, 1 Unit Dial, (TOUJEO SOLOSTAR) 300 UNIT/ML Solostar Pen   Other Relevant Orders   Bayer DCA Hb A1c Waived   Hyperlipidemia associated with type 2 diabetes mellitus (HCC) - Primary   Relevant Medications   dapagliflozin propanediol (FARXIGA) 10 MG TABS tablet   fenofibrate 160 MG  tablet   insulin glargine, 1 Unit Dial, (TOUJEO SOLOSTAR) 300 UNIT/ML Solostar Pen   Other Visit Diagnoses       Mixed hyperlipidemia       Relevant Medications   fenofibrate 160 MG tablet     Gastroesophageal reflux disease       Relevant Medications   pantoprazole (PROTONIX) 20 MG tablet     A1c is 9.1.  No better.  Instructed patient to start Toujeo 10 units daily check blood sugar every morning.  Blood pressure elevated but very agitated today from recent loss of his stepson committed suicide.  He is going to see consider hide treatment may I would do if trouble sleeping.  Follow up plan: Return in about 3 months (around 01/05/2024), or if symptoms worsen or fail to improve, for Diabetes and hypertension and thyroid and cholesterol.  Counseling provided for all of the vaccine components Orders Placed This Encounter  Procedures   Bayer DCA Hb A1c Waived    Arville Care, MD [facility] 10/05/2023, 2:42 PM

## 2023-10-06 ENCOUNTER — Ambulatory Visit: Payer: Self-pay | Admitting: Family Medicine

## 2023-10-06 ENCOUNTER — Telehealth: Payer: Self-pay

## 2023-10-06 DIAGNOSIS — G894 Chronic pain syndrome: Secondary | ICD-10-CM | POA: Diagnosis not present

## 2023-10-06 DIAGNOSIS — K5900 Constipation, unspecified: Secondary | ICD-10-CM | POA: Diagnosis not present

## 2023-10-06 DIAGNOSIS — E1142 Type 2 diabetes mellitus with diabetic polyneuropathy: Secondary | ICD-10-CM | POA: Diagnosis not present

## 2023-10-06 DIAGNOSIS — M47817 Spondylosis without myelopathy or radiculopathy, lumbosacral region: Secondary | ICD-10-CM | POA: Diagnosis not present

## 2023-10-06 NOTE — Telephone Encounter (Signed)
 Patient walked in and got assistance from triage

## 2023-10-06 NOTE — Telephone Encounter (Signed)
 Requests instructions for using TOUJEO injection  Disposition: [x] Appointment(In office)  Additional Notes: Spoke with pt wife, Joseph [name]. Pt wife requesting instructions on how to use TOUJEO injection pen. This RN attempted to give instructions but pt wife states she wants to make an appt to see if in person. Pt wife states she will call back to schedule appt as she needs to talk with Joseph [name] about when he he is available.    Copied from CRM [id number]. Topic: Clinical - Medication Question >> Oct 06, 2023  8:53 AM Elle L wrote: Reason for CRM: The patient's wife, Joseph [name], is requesting to speak to a Nurse as they are unsure how to use the dial and inject the insulin glargine, 1 Unit Dial, (TOUJEO SOLOSTAR) 300 UNIT/ML Solostar Pen.  Her call back number is [phone]. Reason for Disposition  Requesting regular office appointment  Answer Assessment - Initial Assessment Questions 1. REASON FOR CALL or QUESTION: "What is your reason for calling today?" or "How can I best help you?" or "What question do you have that I can help answer?"     Advice for how to use Toujeo injection pen  Protocols used: Information Only Call - No Triage-A-AH

## 2023-10-06 NOTE — Telephone Encounter (Signed)
 Patient's wife called, left VM to return the call to the office.  Copied from CRM [id number]. Topic: Clinical - Medication Question >> Oct 06, 2023  8:53 AM Elle L wrote: Reason for CRM: The patient's wife, Lawson Fiscal, is requesting to speak to a Nurse as they are unsure how to use the dial and inject the insulin glargine, 1 Unit Dial, (TOUJEO SOLOSTAR) 300 UNIT/ML Solostar Pen.  Her call back number is [phone].

## 2023-10-13 ENCOUNTER — Other Ambulatory Visit (INDEPENDENT_AMBULATORY_CARE_PROVIDER_SITE_OTHER): Payer: Medicare Other | Admitting: Pharmacist

## 2023-10-13 DIAGNOSIS — E119 Type 2 diabetes mellitus without complications: Secondary | ICD-10-CM

## 2023-10-13 DIAGNOSIS — Z794 Long term (current) use of insulin: Secondary | ICD-10-CM

## 2023-10-13 NOTE — Progress Notes (Signed)
 10/13/2023 Name: Joseph [name] MRN: [id number] DOB: 09/02/1960  Chief Complaint  Patient presents with   Diabetes    Joseph [name] is a 63 y.o. year old male who presented for a telephone visit.   They were referred to the pharmacist by their PCP for assistance in managing diabetes.    Subjective:  Care Team: Primary Care Provider: Dettinger, Elige Radon, MD ; Next Scheduled Visit: 12/2023   Medication Access/Adherence  Current Pharmacy:  [phone] - [place] - [address] [place] [address] Phone: [phone] Fax: [phone]   Patient reports affordability concerns with their medications: No  Patient reports access/transportation concerns to their pharmacy: No  Patient reports adherence concerns with their medications:  No     Diabetes:  Current medications: Farxiga, newly started on insulin Medications tried in the past: metformin Updated allergy/intolerance list)  Current glucose readings: 200s Traditional  glucometer  Patient denies hypoglycemic s/sx including dizziness, shakiness, sweating. Patient denies hyperglycemic symptoms including polyuria, polydipsia, polyphagia, nocturia, neuropathy, blurred vision.  Current meal patterns:  Discussed meal planning options and Plate method for healthy eating Avoid sugary drinks and desserts Incorporate balanced protein, non starchy veggies, 1 serving of carbohydrate with each meal Increase water intake Increase physical activity as able  Current physical activity: encouraged as able  Current medication access support: medicare   Objective:  Lab Results  Component Value Date   HGBA1C 9.1 (H) 10/05/2023    Lab Results  Component Value Date   CREATININE 1.24 07/07/2023   BUN 17 07/07/2023   NA 137 07/07/2023   K 4.3 07/07/2023   CL 96 07/07/2023   CO2 25 07/07/2023    Lab Results  Component Value Date   CHOL 184 07/07/2023   HDL 41 07/07/2023    LDLCALC 83 07/07/2023   TRIG 373 (H) 07/07/2023   CHOLHDL 4.5 07/07/2023    Medications Reviewed Today     Reviewed by Danella Maiers, [facility] (Pharmacist) on 10/13/23 at 1014  Med List Status: <None>   Medication Order Taking? Sig Documenting Provider Last Dose Status Informant  albuterol (VENTOLIN HFA) 108 (90 Base) MCG/ACT inhaler [id number] No INHALE 1-2 PUFFS BY MOUTH EVERY 6 HOURS AS NEEDED FOR WHEEZE OR SHORTNESS OF BREATH Dettinger, Elige Radon, MD Taking Active   AMITIZA 24 MCG capsule [id number] No Take 24 mcg by mouth 2 (two) times daily. [provider] Taking Active   ARIPiprazole (ABILIFY) 2 MG tablet [id number] No Take 1 tablet (2 mg total) by mouth at bedtime. Elsie Lincoln, MD Taking Active   Cholecalciferol (VITAMIN D3) 5000 units CAPS [id number] No Take 5,000 Units by mouth. [provider] Taking Active   dapagliflozin propanediol (FARXIGA) 10 MG TABS tablet [id number]  Take 1 tablet (10 mg total) by mouth daily before breakfast. Dettinger, Elige Radon, MD  Active   fenofibrate 160 MG tablet [id number]  Take 1 tablet (160 mg total) by mouth daily. Dettinger, Elige Radon, MD  Active   finasteride (PROSCAR) 5 MG tablet [id number]  Take 1 tablet (5 mg total) by mouth daily. Dettinger, Elige Radon, MD  Active   glucose blood (ACCU-CHEK AVIVA PLUS) test strip [id number] No Use as instructed Dettinger, Elige Radon, MD Taking Active   hydrOXYzine (VISTARIL) 25 MG capsule [id number] No Take 25-50 mg by mouth at bedtime as needed for anxiety (insomnia). [provider] Taking Active   insulin glargine, 1 Unit Dial, (TOUJEO SOLOSTAR) 300 UNIT/ML Solostar Pen  [id number]  Inject 10 Units into the skin daily at 12 noon. Dettinger, Elige Radon, MD  Active   levothyroxine (SYNTHROID) 150 MCG tablet [id number] No TAKE 1 TABLET BY MOUTH EVERY DAY Dettinger, Elige Radon, MD Taking Active   mirtazapine (REMERON) 45 MG tablet [id number] No Take 1 tablet (45 mg total) by mouth at bedtime. Elsie Lincoln, MD Taking Active   morphine (MS CONTIN) 30 MG 12 hr tablet [id number] No Take 30 mg by mouth every 8 (eight) hours. [provider] Taking Active   oxyCODONE-acetaminophen (PERCOCET) 7.5-325 MG tablet [id number] No TAKE 1 TABLET BY MOUTH EVERY FOUR HOURS AS NEEDED FOR PAIN [provider] Taking Active   pantoprazole (PROTONIX) 20 MG tablet [id number]  Take 1 tablet (20 mg total) by mouth daily. Dettinger, Elige Radon, MD  Active   pregabalin (LYRICA) 75 MG capsule [id number] No Take 75 mg by mouth 3 (three) times daily. [provider] Taking Active   rizatriptan (MAXALT) 10 MG tablet [id number] No TAKE 1 TABLET BY MOUTH AS NEEDED FOR MIGRAINE. MAY REPEAT IN 2 HOURS IF NEEDED Dettinger, Elige Radon, MD Taking Active   rosuvastatin (CRESTOR) 10 MG tablet [id number] No TAKE 1 TABLET BY MOUTH EVERY DAY Dettinger, Elige Radon, MD Taking Active   sertraline (ZOLOFT) 100 MG tablet [id number] No Take 1 tablet (100 mg total) by mouth daily. Elsie Lincoln, MD Taking Active   tamsulosin [facility]) 0.4 MG CAPS capsule [id number] No Take 1 capsule (0.4 mg total) by mouth daily. Dettinger, Elige Radon, MD Taking Active   Tiotropium Bromide Monohydrate (SPIRIVA RESPIMAT) 2.5 MCG/ACT AERS [id number] No Inhale 2 puffs into the lungs daily. Dettinger, Elige Radon, MD Taking Active   tiZANidine (ZANAFLEX) 2 MG tablet [id number] No Take 2 mg by mouth 3 (three) times daily as needed for muscle spasms. [provider] Taking Active   trazodone (DESYREL) 300 MG tablet [id number]  Take 1 tablet (300 mg total) by mouth at bedtime. Dettinger, Elige Radon, MD  Active            Assessment/Plan:   Diabetes: - Currently uncontrolled - Reviewed long term cardiovascular and renal outcomes of uncontrolled blood sugar - Reviewed goal A1c, goal fasting, and goal 2 hour post prandial glucose - Recommend to: Continue Farxiga 10mg  daily Increase insulin glargine to 12 units Look at starting GLP1 after ~3  months of insulin if patient is amenable; likely be able to discontinue insulin at that point (do not see contraindication to GLP1) Respectfully declined CGM - Patient denies personal or family history of multiple endocrine neoplasia type 2, medullary thyroid cancer; personal history of pancreatitis or gallbladder disease. - Recommend to check glucose daily (fasting) or if symptomatic  Currently on spirivia, but could likely benefit from Breztri--patient administers Spiriva via trach, could use spacer, but no samples today; will check again in 2 weeks    Follow Up Plan: PharmD in 2 weeks   Kieth Brightly, PharmD, BCACP, CPP Clinical Pharmacist, [facility]

## 2023-10-27 ENCOUNTER — Other Ambulatory Visit: Payer: Self-pay | Admitting: Family Medicine

## 2023-11-07 DIAGNOSIS — M47817 Spondylosis without myelopathy or radiculopathy, lumbosacral region: Secondary | ICD-10-CM | POA: Diagnosis not present

## 2023-11-07 DIAGNOSIS — G894 Chronic pain syndrome: Secondary | ICD-10-CM | POA: Diagnosis not present

## 2023-11-07 DIAGNOSIS — K5900 Constipation, unspecified: Secondary | ICD-10-CM | POA: Diagnosis not present

## 2023-11-07 DIAGNOSIS — E1142 Type 2 diabetes mellitus with diabetic polyneuropathy: Secondary | ICD-10-CM | POA: Diagnosis not present

## 2023-11-08 ENCOUNTER — Telehealth: Payer: Self-pay

## 2023-11-08 NOTE — Telephone Encounter (Signed)
 Printed and faxed

## 2023-11-08 NOTE — Telephone Encounter (Signed)
 Copied from CRM [id number]. Topic: General - Other >> Nov 08, 2023 11:42 AM Baldemar Lev wrote: Reason for CRM: Shulane from [facility] called requesting a demographic page for the patient   Fax: [phone] Best contact: [phone]

## 2023-11-10 ENCOUNTER — Other Ambulatory Visit

## 2023-11-10 ENCOUNTER — Other Ambulatory Visit (HOSPITAL_COMMUNITY): Payer: Self-pay

## 2023-11-10 DIAGNOSIS — E119 Type 2 diabetes mellitus without complications: Secondary | ICD-10-CM

## 2023-11-10 DIAGNOSIS — Z794 Long term (current) use of insulin: Secondary | ICD-10-CM

## 2023-11-10 NOTE — Progress Notes (Signed)
 11/10/2023 Name: Joseph [name] MRN: [id number] DOB: 04-27-1961  Chief Complaint  Patient presents with   Diabetes    Joseph [name] is a 63 y.o. year old male who presented for a telephone visit.  I connected with  Joseph [name] on 11/10/23 by telephone and verified that I am speaking with the correct person using two identifiers. I discussed the limitations of evaluation and management by telemedicine. The patient expressed understanding and agreed to proceed.  Patient was located in her home and PharmD in PCP office during this visit.   They were referred to the pharmacist by their PCP for assistance in managing diabetes.    Subjective:  Care Team: Primary Care Provider: Dettinger, Lucio Sabin, MD ; Next Scheduled Visit: 12/2023  Medication Access/Adherence  [phone] [place] - [address] [place] [address] Phone: [phone] Fax: [phone]   Patient reports affordability concerns with their medications: No  Patient reports access/transportation concerns to their pharmacy: No  Patient reports adherence concerns with their medications:  No     Diabetes:  Current medications: Toujeo  12 units daily, Farxiga  Medications tried in the past: n/a  Current glucose readings: 140-150 FBG Using traditional meter  Patient denies hypoglycemic s/sx including dizziness, shakiness, sweating. Patient denies hyperglycemic symptoms including polyuria, polydipsia, polyphagia, nocturia, neuropathy, blurred vision.  Current meal patterns:   Current physical activity: encouraged as able  Current medication access support: appears to have LIS/extra help   Objective:  Lab Results  Component Value Date   HGBA1C 9.1 (H) 10/05/2023    Lab Results  Component Value Date   CREATININE 1.24 07/07/2023   BUN 17 07/07/2023   NA 137 07/07/2023   K 4.3 07/07/2023   CL 96 07/07/2023   CO2 25 07/07/2023     Lab Results  Component Value Date   CHOL 184 07/07/2023   HDL 41 07/07/2023   LDLCALC 83 07/07/2023   TRIG 373 (H) 07/07/2023   CHOLHDL 4.5 07/07/2023    Medications Reviewed Today     Reviewed by Joseph [name], [facility] (Pharmacist) on 11/10/23 at 1122  Med List Status: <None>   Medication Order Taking? Sig Documenting Provider Last Dose Status Informant  albuterol  (VENTOLIN  HFA) 108 (90 Base) MCG/ACT inhaler [id number] No INHALE 1-2 PUFFS BY MOUTH EVERY 6 HOURS AS NEEDED FOR WHEEZE OR SHORTNESS OF BREATH Dettinger, Lucio Sabin, MD Taking Active   AMITIZA  24 MCG capsule [id number] No Take 24 mcg by mouth 2 (two) times daily. [provider] Taking Active   ARIPiprazole  (ABILIFY ) 2 MG tablet [id number] No Take 1 tablet (2 mg total) by mouth at bedtime. Madie Schilling, MD Taking Active   Cholecalciferol (VITAMIN D3) 5000 units CAPS [id number] No Take 5,000 Units by mouth. [provider] Taking Active   dapagliflozin  propanediol (FARXIGA ) 10 MG TABS tablet [id number]  Take 1 tablet (10 mg total) by mouth daily before breakfast. Dettinger, Lucio Sabin, MD  Active   fenofibrate  160 MG tablet [id number]  Take 1 tablet (160 mg total) by mouth daily. Dettinger, Lucio Sabin, MD  Active   finasteride  (PROSCAR ) 5 MG tablet [id number]  Take 1 tablet (5 mg total) by mouth daily. Dettinger, Lucio Sabin, MD  Active   glucose blood (ACCU-CHEK AVIVA PLUS) test strip [id number] No Use as instructed Dettinger, Lucio Sabin, MD Taking Active   hydrOXYzine  (VISTARIL ) 25 MG capsule [id number] No Take 25-50 mg by mouth at bedtime  as needed for anxiety (insomnia). [provider] Taking Active   insulin  glargine, 1 Unit Dial, (TOUJEO  SOLOSTAR) 300 UNIT/ML Solostar Pen [id number]  Inject 10 Units into the skin daily at 12 noon.  Patient taking differently: Inject 12-14 Units into the skin daily at 12 noon.   Dettinger, Lucio Sabin, MD  Active   levothyroxine  (SYNTHROID ) 150 MCG tablet [id number] No TAKE 1  TABLET BY MOUTH EVERY DAY Dettinger, Lucio Sabin, MD Taking Active   mirtazapine  (REMERON ) 45 MG tablet [id number] No Take 1 tablet (45 mg total) by mouth at bedtime. Madie Schilling, MD Taking Active   morphine  (MS CONTIN ) 30 MG 12 hr tablet [id number] No Take 30 mg by mouth every 8 (eight) hours. [provider] Taking Active   oxyCODONE -acetaminophen  (PERCOCET) 7.5-325 MG tablet [id number] No TAKE 1 TABLET BY MOUTH EVERY FOUR HOURS AS NEEDED FOR PAIN [provider] Taking Active   pantoprazole  (PROTONIX ) 20 MG tablet [id number]  Take 1 tablet (20 mg total) by mouth daily. Dettinger, Lucio Sabin, MD  Active   pregabalin (LYRICA) 75 MG capsule [id number] No Take 75 mg by mouth 3 (three) times daily. [provider] Taking Active   rizatriptan  (MAXALT ) 10 MG tablet [id number] No TAKE 1 TABLET BY MOUTH AS NEEDED FOR MIGRAINE. MAY REPEAT IN 2 HOURS IF NEEDED Dettinger, Lucio Sabin, MD Taking Active   rosuvastatin  (CRESTOR ) 10 MG tablet [id number] No TAKE 1 TABLET BY MOUTH EVERY DAY Dettinger, Lucio Sabin, MD Taking Active   sertraline  (ZOLOFT ) 100 MG tablet [id number] No Take 1 tablet (100 mg total) by mouth daily. Madie Schilling, MD Taking Active   tamsulosin  (FLOMAX ) 0.4 MG CAPS capsule [id number] No Take 1 capsule (0.4 mg total) by mouth daily. Dettinger, Lucio Sabin, MD Taking Active   Tiotropium Bromide  Monohydrate (SPIRIVA  RESPIMAT) 2.5 MCG/ACT AERS [id number] No Inhale 2 puffs into the lungs daily. Dettinger, Lucio Sabin, MD Taking Active   tiZANidine (ZANAFLEX) 2 MG tablet [id number] No Take 2 mg by mouth 3 (three) times daily as needed for muscle spasms. [provider] Taking Active   trazodone  (DESYREL ) 300 MG tablet [id number]  TAKE 1 TABLET BY MOUTH EVERYDAY AT BEDTIME Dettinger, Lucio Sabin, MD  Active              Assessment/Plan:   Diabetes: - Currently controlled - Reviewed long term cardiovascular and renal outcomes of uncontrolled blood sugar - Reviewed goal A1c,  goal fasting, and goal 2 hour post prandial glucose - Reviewed dietary modifications including FOLLOWING A HEART HEALTHY DIET/HEALTHY PLATE METHOD - Recommend to: Start Ozempic 0.25mg  sq weekly (copay $12.15 1 month) will await patient's decision to start/cost  Continue Farxiga  10mg  daily  Stop Toujeo  when starting Ozempic - Patient denies personal or family history of multiple endocrine neoplasia type 2, medullary thyroid  cancer; personal history of pancreatitis or gallbladder disease. - Recommend to check glucose daily (fasting) or if symptomatic  Follow Up Plan: 1 month   Marvell Slider, PharmD, BCACP, CPP Clinical Pharmacist, [facility]

## 2023-11-17 ENCOUNTER — Other Ambulatory Visit: Payer: Self-pay | Admitting: Family Medicine

## 2023-11-17 DIAGNOSIS — J439 Emphysema, unspecified: Secondary | ICD-10-CM

## 2023-11-22 ENCOUNTER — Encounter (HOSPITAL_COMMUNITY): Payer: Self-pay | Admitting: Psychiatry

## 2023-11-22 ENCOUNTER — Telehealth (HOSPITAL_COMMUNITY): Payer: Medicare Other | Admitting: Psychiatry

## 2023-11-22 DIAGNOSIS — G4709 Other insomnia: Secondary | ICD-10-CM

## 2023-11-22 DIAGNOSIS — G894 Chronic pain syndrome: Secondary | ICD-10-CM

## 2023-11-22 DIAGNOSIS — Z79899 Other long term (current) drug therapy: Secondary | ICD-10-CM | POA: Diagnosis not present

## 2023-11-22 DIAGNOSIS — Z634 Disappearance and death of family member: Secondary | ICD-10-CM

## 2023-11-22 DIAGNOSIS — F4321 Adjustment disorder with depressed mood: Secondary | ICD-10-CM

## 2023-11-22 DIAGNOSIS — F064 Anxiety disorder due to known physiological condition: Secondary | ICD-10-CM

## 2023-11-22 DIAGNOSIS — F331 Major depressive disorder, recurrent, moderate: Secondary | ICD-10-CM

## 2023-11-22 DIAGNOSIS — Z930 Tracheostomy status: Secondary | ICD-10-CM | POA: Diagnosis not present

## 2023-11-22 MED ORDER — SERTRALINE HCL 100 MG PO TABS: 100.0000 mg | ORAL_TABLET | Freq: Every day | ORAL | 1 refills | Status: AC

## 2023-11-22 MED ORDER — MIRTAZAPINE 45 MG PO TABS: 45.0000 mg | ORAL_TABLET | Freq: Every day | ORAL | 1 refills | Status: AC

## 2023-11-22 MED ORDER — HYDROXYZINE PAMOATE 25 MG PO CAPS: 25.0000 mg | ORAL_CAPSULE | Freq: Every evening | ORAL | 5 refills | Status: AC | PRN

## 2023-11-22 MED ORDER — ARIPIPRAZOLE 2 MG PO TABS: 2.0000 mg | ORAL_TABLET | Freq: Every evening | ORAL | 1 refills | Status: AC

## 2023-11-22 NOTE — Patient Instructions (Addendum)
 We did not make any medication changes today but you could try cutting the trazodone  in half to see if this helps with sleep; doses at 300mg  and above can be activating. I am so sorry for your loss. Grief groups can make a difference but weighing that against potentially re-traumatization is key. Please coordinate with your PCP in order to get an updated EKG while you are on the abilify  (specifically we need to check the QTc interval). To find your next psychiatrist, you could try [facility] in [place] or [facility] in [place]. To stay with [place] you could try their [place] office or [facility] on [address]
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
## 2023-12-01 ENCOUNTER — Telehealth: Payer: Self-pay

## 2023-12-01 NOTE — Telephone Encounter (Signed)
 Copied from CRM [id number]. Topic: General - Other >> Dec 01, 2023  9:15 AM Joseph [name] T wrote: Reason for CRM: patient called stated Joseph [name] called him yesterday about his medication and he is requesting a call back

## 2023-12-07 DIAGNOSIS — E1142 Type 2 diabetes mellitus with diabetic polyneuropathy: Secondary | ICD-10-CM | POA: Diagnosis not present

## 2023-12-07 DIAGNOSIS — M47817 Spondylosis without myelopathy or radiculopathy, lumbosacral region: Secondary | ICD-10-CM | POA: Diagnosis not present

## 2023-12-07 DIAGNOSIS — K5900 Constipation, unspecified: Secondary | ICD-10-CM | POA: Diagnosis not present

## 2023-12-07 DIAGNOSIS — G894 Chronic pain syndrome: Secondary | ICD-10-CM | POA: Diagnosis not present

## 2023-12-08 ENCOUNTER — Telehealth: Payer: Self-pay | Admitting: Family Medicine

## 2023-12-08 NOTE — Telephone Encounter (Signed)
 Copied from CRM [id number]. Topic: General - Other >> Dec 01, 2023  9:15 AM Loreda Rodriguez T wrote: Reason for CRM: patient called stated Concha Deed called him yesterday about his medication and he is requesting a call back >> Dec 08, 2023  3:29 PM Tiffany H wrote: Patient called to follow up on call out. Advised patient to discontinue insulin , continue Farxiga  and start Ozempic. Patient voiced understanding and agrees to move forward. Patient confirmed pharmacy:   [facility] [phone] - [place], [place] - [address] [place], [place] [address] Phone: [phone]  Fax: [phone] DEA #: [id number]  DAW Reason: --

## 2023-12-08 NOTE — Telephone Encounter (Signed)
 Returned call no answer Plan is to START ozempic STOP insulin   Continue farxiga 

## 2023-12-08 NOTE — Telephone Encounter (Signed)
 Fyi.

## 2023-12-19 ENCOUNTER — Other Ambulatory Visit (INDEPENDENT_AMBULATORY_CARE_PROVIDER_SITE_OTHER): Admitting: Pharmacist

## 2023-12-19 DIAGNOSIS — E119 Type 2 diabetes mellitus without complications: Secondary | ICD-10-CM

## 2023-12-19 DIAGNOSIS — Z794 Long term (current) use of insulin: Secondary | ICD-10-CM

## 2023-12-19 DIAGNOSIS — Z7984 Long term (current) use of oral hypoglycemic drugs: Secondary | ICD-10-CM

## 2023-12-19 MED ORDER — OZEMPIC (0.25 OR 0.5 MG/DOSE) 2 MG/3ML ~~LOC~~ SOPN
0.2500 mg | PEN_INJECTOR | SUBCUTANEOUS | 1 refills | Status: DC
Start: 2023-12-19 — End: 2024-01-06

## 2023-12-19 NOTE — Progress Notes (Addendum)
 12/19/2023 Name: Joseph [name] MRN: [id number] DOB: [birth year]  Chief Complaint  Patient presents with   Diabetes    Joseph [name] is a 63 y.o. year old male who presented for a telephone visit. I connected with  [name] on 12/19/23 by telephone and verified that I am speaking with the correct person using two identifiers. I discussed the limitations of evaluation and management by telemedicine. The patient expressed understanding and agreed to proceed. Patient was located in his home and PharmD in PCP office during this visit.   They were referred to the pharmacist by their PCP for assistance in managing diabetes.   Patient was identified as falling into the [place] Measure - Diabetes.   Patient was: Referred to pharmacy for chronic disease management.   Subjective:  Care Team: Primary Care Provider: Dettinger, Lucio Sabin, MD ; Next Scheduled Visit: 01/06/24  Medication Access/Adherence  [phone] [place] - [address] [place] [address] Phone: [phone] Fax: [phone]   Patient reports affordability concerns with their medications: No  Patient reports access/transportation concerns to their pharmacy: No  Patient reports adherence concerns with their medications:  No     Diabetes:  Current medications: Tresiba 12 units daily, Farxiga  10 mg daily  Patient reports he was going to start on Ozempic, but pharmacy did not have a prescription.  Using glucometer; testing once daily Fasting BG: 150-160  Patient denies hypoglycemic s/sx including dizziness, shakiness, sweating. Patient denies hyperglycemic symptoms including polyuria, polydipsia, polyphagia, nocturia, neuropathy, blurred vision.  Current medication access support: appears to have Medicare LIS (extra help)   Objective:  Lab Results  Component Value Date   HGBA1C 9.1 (H) 10/05/2023    Lab Results  Component  Value Date   CREATININE 1.24 07/07/2023   BUN 17 07/07/2023   NA 137 07/07/2023   K 4.3 07/07/2023   CL 96 07/07/2023   CO2 25 07/07/2023    Lab Results  Component Value Date   CHOL 184 07/07/2023   HDL 41 07/07/2023   LDLCALC 83 07/07/2023   TRIG 373 (H) 07/07/2023   CHOLHDL 4.5 07/07/2023    Medications Reviewed Today     Reviewed by Philmore Bream, RPH (Pharmacist) on 12/19/23 at 1038  Med List Status: <None>   Medication Order Taking? Sig Documenting Provider Last Dose Status Informant  albuterol  (VENTOLIN  HFA) 108 (90 Base) MCG/ACT inhaler [id number]  INHALE 1-2 PUFFS BY MOUTH EVERY 6 HOURS AS NEEDED FOR WHEEZE OR SHORTNESS OF BREATH Dettinger, Lucio Sabin, MD  Active   AMITIZA  24 MCG capsule [id number]  Take 24 mcg by mouth 2 (two) times daily. [provider]  Active   ARIPiprazole  (ABILIFY ) 2 MG tablet [id number]  Take 1 tablet (2 mg total) by mouth at bedtime. Madie Schilling, MD  Active   Cholecalciferol (VITAMIN D3) 5000 units CAPS [id number]  Take 5,000 Units by mouth. [provider]  Active   dapagliflozin  propanediol (FARXIGA ) 10 MG TABS tablet [id number] Yes Take 1 tablet (10 mg total) by mouth daily before breakfast. Dettinger, Lucio Sabin, MD Taking Active   fenofibrate  160 MG tablet [id number]  Take 1 tablet (160 mg total) by mouth daily. Dettinger, Lucio Sabin, MD  Active   finasteride  (PROSCAR ) 5 MG tablet [id number]  Take 1 tablet (5 mg total) by mouth daily. Dettinger, Lucio Sabin, MD  Active   glucose blood (ACCU-CHEK AVIVA PLUS) test strip [id number]  Use as instructed Dettinger, Lucio Sabin, MD  Active   hydrOXYzine  (VISTARIL ) 25 MG capsule [id number]  Take 1-2 capsules (25-50 mg total) by mouth at bedtime as needed for anxiety (insomnia). Madie Schilling, MD  Active   insulin  glargine, 1 Unit Dial, (TOUJEO  SOLOSTAR) 300 UNIT/ML Solostar Pen [id number] Yes Inject 10 Units into the skin daily at 12 noon.  Patient taking differently: Inject 12-14 Units into the  skin daily at 12 noon.   Dettinger, Lucio Sabin, MD Taking Active   levothyroxine  (SYNTHROID ) 150 MCG tablet [id number]  TAKE 1 TABLET BY MOUTH EVERY DAY Dettinger, Lucio Sabin, MD  Active   mirtazapine  (REMERON ) 45 MG tablet [id number]  Take 1 tablet (45 mg total) by mouth at bedtime. Madie Schilling, MD  Active   morphine  (MS CONTIN ) 30 MG 12 hr tablet [id number]  Take 30 mg by mouth every 8 (eight) hours. [provider]  Active   oxyCODONE -acetaminophen  (PERCOCET) 7.5-325 MG tablet [id number]  TAKE 1 TABLET BY MOUTH EVERY FOUR HOURS AS NEEDED FOR PAIN [provider]  Active   pantoprazole  (PROTONIX ) 20 MG tablet [id number]  Take 1 tablet (20 mg total) by mouth daily. Dettinger, Lucio Sabin, MD  Active   pregabalin (LYRICA) 75 MG capsule [id number]  Take 75 mg by mouth 3 (three) times daily. [provider]  Active   rizatriptan  (MAXALT ) 10 MG tablet [id number]  TAKE 1 TABLET BY MOUTH AS NEEDED FOR MIGRAINE. MAY REPEAT IN 2 HOURS IF NEEDED Dettinger, Lucio Sabin, MD  Active   rosuvastatin  (CRESTOR ) 10 MG tablet [id number]  TAKE 1 TABLET BY MOUTH EVERY DAY Dettinger, Lucio Sabin, MD  Active   sertraline  (ZOLOFT ) 100 MG tablet [id number]  Take 1 tablet (100 mg total) by mouth daily. Madie Schilling, MD  Active   tamsulosin  (FLOMAX ) 0.4 MG CAPS capsule [id number]  Take 1 capsule (0.4 mg total) by mouth daily. Dettinger, Lucio Sabin, MD  Active   Tiotropium Bromide  Monohydrate (SPIRIVA  RESPIMAT) 2.5 MCG/ACT AERS [id number]  Inhale 2 puffs into the lungs daily. Dettinger, Lucio Sabin, MD  Active   tiZANidine (ZANAFLEX) 2 MG tablet [id number]  Take 2 mg by mouth 3 (three) times daily as needed for muscle spasms. [provider]  Active   trazodone  (DESYREL ) 300 MG tablet [id number]  TAKE 1 TABLET BY MOUTH EVERYDAY AT BEDTIME Dettinger, Lucio Sabin, MD  Active               Assessment/Plan:   Diabetes: - Currently uncontrolled based on last A1C 9.1% on 10/05/23, above goal <7%. Plan to  start Ozempic today as previously recommended. Previously discussed discontinuing [place] with starting Ozempic, however patient reported fasting BG readings are elevated. Recommend to start by decreasing Tresiba to 10 units when starting Ozempic 0.25 mg then likely can discontinue insulin  when increasing Ozempic to 0.5 mg weekly. - Reviewed long term cardiovascular and renal outcomes of uncontrolled blood sugar - Reviewed goal A1c, goal fasting, and goal 2 hour post prandial glucose - Recommend to start Ozempic 0.25 mg weekly  - Recommend to decrease Tresiba to 10 units daily - Recommend to continue Farxiga  10 mg daily - Patient denies personal or family history of multiple endocrine neoplasia type 2, medullary thyroid  cancer; personal history of pancreatitis or gallbladder disease. - Recommend to check fasting blood glucose once daily or if symptomatic - A1C due at next PCP visit on 01/06/24    Follow Up Plan: PCP on 01/06/24, PharmD on 02/09/24  Georga Killings, PharmD PGY-1 Pharmacy Resident

## 2023-12-27 ENCOUNTER — Other Ambulatory Visit: Payer: Self-pay | Admitting: Family Medicine

## 2023-12-27 DIAGNOSIS — J439 Emphysema, unspecified: Secondary | ICD-10-CM

## 2023-12-27 DIAGNOSIS — E0822 Diabetes mellitus due to underlying condition with diabetic chronic kidney disease: Secondary | ICD-10-CM

## 2023-12-27 DIAGNOSIS — N401 Enlarged prostate with lower urinary tract symptoms: Secondary | ICD-10-CM

## 2024-01-03 ENCOUNTER — Other Ambulatory Visit: Payer: Self-pay | Admitting: Family Medicine

## 2024-01-03 DIAGNOSIS — L2089 Other atopic dermatitis: Secondary | ICD-10-CM

## 2024-01-05 ENCOUNTER — Ambulatory Visit: Admitting: Family Medicine

## 2024-01-05 ENCOUNTER — Other Ambulatory Visit: Payer: Self-pay | Admitting: Family Medicine

## 2024-01-05 DIAGNOSIS — G894 Chronic pain syndrome: Secondary | ICD-10-CM | POA: Diagnosis not present

## 2024-01-05 DIAGNOSIS — E1142 Type 2 diabetes mellitus with diabetic polyneuropathy: Secondary | ICD-10-CM | POA: Diagnosis not present

## 2024-01-05 DIAGNOSIS — M47817 Spondylosis without myelopathy or radiculopathy, lumbosacral region: Secondary | ICD-10-CM | POA: Diagnosis not present

## 2024-01-05 DIAGNOSIS — K5900 Constipation, unspecified: Secondary | ICD-10-CM | POA: Diagnosis not present

## 2024-01-06 ENCOUNTER — Encounter: Payer: Self-pay | Admitting: Family Medicine

## 2024-01-06 ENCOUNTER — Ambulatory Visit: Admitting: Family Medicine

## 2024-01-06 VITALS — BP 134/80 | HR 69 | Ht 74.0 in | Wt 238.0 lb

## 2024-01-06 DIAGNOSIS — E039 Hypothyroidism, unspecified: Secondary | ICD-10-CM | POA: Diagnosis not present

## 2024-01-06 DIAGNOSIS — E1169 Type 2 diabetes mellitus with other specified complication: Secondary | ICD-10-CM

## 2024-01-06 DIAGNOSIS — Z794 Long term (current) use of insulin: Secondary | ICD-10-CM

## 2024-01-06 DIAGNOSIS — N1831 Chronic kidney disease, stage 3a: Secondary | ICD-10-CM

## 2024-01-06 DIAGNOSIS — E1122 Type 2 diabetes mellitus with diabetic chronic kidney disease: Secondary | ICD-10-CM | POA: Diagnosis not present

## 2024-01-06 DIAGNOSIS — I152 Hypertension secondary to endocrine disorders: Secondary | ICD-10-CM | POA: Diagnosis not present

## 2024-01-06 DIAGNOSIS — E785 Hyperlipidemia, unspecified: Secondary | ICD-10-CM | POA: Diagnosis not present

## 2024-01-06 DIAGNOSIS — E782 Mixed hyperlipidemia: Secondary | ICD-10-CM | POA: Diagnosis not present

## 2024-01-06 DIAGNOSIS — E1159 Type 2 diabetes mellitus with other circulatory complications: Secondary | ICD-10-CM | POA: Diagnosis not present

## 2024-01-06 DIAGNOSIS — E0822 Diabetes mellitus due to underlying condition with diabetic chronic kidney disease: Secondary | ICD-10-CM

## 2024-01-06 LAB — LIPID PANEL

## 2024-01-06 LAB — BAYER DCA HB A1C WAIVED: HB A1C (BAYER DCA - WAIVED): 8.1 % — ABNORMAL HIGH (ref 4.8–5.6)

## 2024-01-06 MED ORDER — TOUJEO SOLOSTAR 300 UNIT/ML ~~LOC~~ SOPN
12.0000 [IU] | PEN_INJECTOR | Freq: Every day | SUBCUTANEOUS | 3 refills | Status: AC
Start: 2024-01-06 — End: ?

## 2024-01-06 NOTE — Progress Notes (Signed)
 BP 134/80   Pulse 69   Ht 6' 2 (1.88 m)   Wt 238 lb (108 kg)   SpO2 96%   BMI 30.56 kg/m    Subjective:   Patient ID: Joseph [name], male    DOB: 1960/12/29, 63 y.o.   MRN: [id number]  HPI: Joseph [name] is a 63 y.o. male presenting on 01/06/2024 for Medical Management of Chronic Issues, Diabetes, Hyperlipidemia, Hypothyroidism, Shortness of Breath, and Fatigue (Ozempic  made worse)   HPI Type 2 diabetes mellitus Patient comes in today for recheck of his diabetes. Patient has been currently taking Ozempic  but he has not tolerated it well.  He did have to jail before but is not currently taking because we transition to the Ozempic .  He also has Farxiga .. Patient is not currently on an ACE inhibitor/ARB. Patient has not seen an ophthalmologist this year. Patient denies any new issues with their feet. The symptom started onset as an adult hypertension and hyperlipidemia ARE RELATED TO DM   Hypothyroidism recheck Patient is coming in for thyroid  recheck today as well. They deny any issues with hair changes or heat or cold problems or diarrhea or constipation. They deny any chest pain or palpitations. They are currently on levothyroxine  150 micrograms   Hyperlipidemia Patient is coming in for recheck of his hyperlipidemia. The patient is currently taking fenofibrate  and Crestor . They deny any issues with myalgias or history of liver damage from it. They deny any focal numbness or weakness or chest pain.   Hypertension Patient is currently on no medicine currently, and their blood pressure today is 134/80. Patient denies any lightheadedness or dizziness. Patient denies headaches, blurred vision, chest pains, shortness of breath, or weakness. Denies any side effects from medication and is content with current medication.   Relevant past medical, surgical, family and social history reviewed and updated as indicated. Interim medical history since our last visit reviewed. Allergies and  medications reviewed and updated.  Review of Systems  Constitutional:  Negative for chills and fever.  Eyes:  Negative for visual disturbance.  Respiratory:  Negative for shortness of breath and wheezing.   Cardiovascular:  Negative for chest pain and leg swelling.  Musculoskeletal:  Negative for back pain and gait problem.  Skin:  Negative for rash.  Neurological:  Negative for dizziness and light-headedness.  All other systems reviewed and are negative.   Per HPI unless specifically indicated above   Allergies as of 01/06/2024       Reactions   Lisinopril  Other (See Comments)   Dizziness    Metformin  And Related Diarrhea   GI intolerance        Medication List        Accurate as of January 06, 2024  3:09 PM. If you have any questions, ask your nurse or doctor.          STOP taking these medications    Ozempic  (0.25 or 0.5 MG/DOSE) 2 MG/3ML Sopn Generic drug: Semaglutide (0.25 or 0.5MG /DOS) Stopped by: Lucio Sabin Keegan Bensch       TAKE these medications    Accu-Chek Aviva Plus test strip Generic drug: glucose blood Test BS up to 3 times daily Dx E11.22   albuterol  108 (90 Base) MCG/ACT inhaler Commonly known as: VENTOLIN  HFA INHALE 1-2 PUFFS BY MOUTH EVERY 6 HOURS AS NEEDED FOR WHEEZE OR SHORTNESS OF BREATH   Amitiza  24 MCG capsule Generic drug: lubiprostone  Take 24 mcg by mouth 2 (two) times daily.   ARIPiprazole  2  MG tablet Commonly known as: ABILIFY  Take 1 tablet (2 mg total) by mouth at bedtime.   dapagliflozin  propanediol 10 MG Tabs tablet Commonly known as: Farxiga  Take 1 tablet (10 mg total) by mouth daily before breakfast.   fenofibrate  160 MG tablet Take 1 tablet (160 mg total) by mouth daily.   finasteride  5 MG tablet Commonly known as: PROSCAR  Take 1 tablet (5 mg total) by mouth daily.   hydrOXYzine  25 MG capsule Commonly known as: VISTARIL  Take 1-2 capsules (25-50 mg total) by mouth at bedtime as needed for anxiety (insomnia).    levothyroxine  150 MCG tablet Commonly known as: SYNTHROID  TAKE 1 TABLET BY MOUTH EVERY DAY   mirtazapine  45 MG tablet Commonly known as: REMERON  Take 1 tablet (45 mg total) by mouth at bedtime.   morphine  30 MG 12 hr tablet Commonly known as: MS CONTIN  Take 30 mg by mouth every 8 (eight) hours.   oxyCODONE -acetaminophen  7.5-325 MG tablet Commonly known as: PERCOCET TAKE 1 TABLET BY MOUTH EVERY FOUR HOURS AS NEEDED FOR PAIN   pantoprazole  20 MG tablet Commonly known as: PROTONIX  Take 1 tablet (20 mg total) by mouth daily.   pregabalin 75 MG capsule Commonly known as: LYRICA Take 75 mg by mouth 3 (three) times daily.   rizatriptan  10 MG tablet Commonly known as: MAXALT  TAKE 1 TABLET BY MOUTH AS NEEDED FOR MIGRAINE. MAY REPEAT IN 2 HOURS IF NEEDED   rosuvastatin  10 MG tablet Commonly known as: CRESTOR  TAKE 1 TABLET BY MOUTH EVERY DAY   sertraline  100 MG tablet Commonly known as: ZOLOFT  Take 1 tablet (100 mg total) by mouth daily.   Spiriva  Respimat 2.5 MCG/ACT Aers Generic drug: Tiotropium Bromide  Monohydrate Inhale 2 puffs into the lungs daily.   tamsulosin  0.4 MG Caps capsule Commonly known as: FLOMAX  TAKE 1 CAPSULE BY MOUTH EVERY DAY   tiZANidine 2 MG tablet Commonly known as: ZANAFLEX Take 2 mg by mouth 3 (three) times daily as needed for muscle spasms.   Toujeo  SoloStar 300 UNIT/ML Solostar Pen Generic drug: insulin  glargine (1 Unit Dial) Inject 12-14 Units into the skin daily at 12 noon.   trazodone  300 MG tablet Commonly known as: DESYREL  TAKE 1 TABLET BY MOUTH EVERYDAY AT BEDTIME   Vitamin D3 125 MCG (5000 UT) Caps Take 5,000 Units by mouth.         Objective:   BP 134/80   Pulse 69   Ht 6' 2 (1.88 m)   Wt 238 lb (108 kg)   SpO2 96%   BMI 30.56 kg/m   Wt Readings from Last 3 Encounters:  01/06/24 238 lb (108 kg)  10/05/23 246 lb (111.6 kg)  07/07/23 247 lb (112 kg)    Physical Exam Vitals and nursing note reviewed.   Constitutional:      General: He is not in acute distress.    Appearance: He is well-developed. He is not diaphoretic.   Eyes:     General: No scleral icterus.    Conjunctiva/sclera: Conjunctivae normal.   Neck:     Thyroid : No thyromegaly.   Cardiovascular:     Rate and Rhythm: Normal rate and regular rhythm.     Heart sounds: Normal heart sounds. No murmur heard. Pulmonary:     Effort: Pulmonary effort is normal. No respiratory distress.     Breath sounds: Normal breath sounds. No wheezing.   Musculoskeletal:        General: No swelling. Normal range of motion.     Cervical  back: Neck supple.  Lymphadenopathy:     Cervical: No cervical adenopathy.   Skin:    General: Skin is warm and dry.     Findings: No rash.   Neurological:     Mental Status: He is alert and oriented to person, place, and time.     Coordination: Coordination normal.   Psychiatric:        Behavior: Behavior normal.       Assessment & Plan:   Problem List Items Addressed This Visit       Cardiovascular and Mediastinum   Hypertension associated with diabetes (HCC)   Relevant Medications   insulin  glargine, 1 Unit Dial, (TOUJEO  SOLOSTAR) 300 UNIT/ML Solostar Pen     Endocrine   Hypothyroidism - Primary   Relevant Orders   Bayer DCA Hb A1c Waived   CBC with Differential/Platelet   CMP14+EGFR   Lipid panel   TSH   Diabetes mellitus with chronic kidney disease (HCC)   Relevant Medications   insulin  glargine, 1 Unit Dial, (TOUJEO  SOLOSTAR) 300 UNIT/ML Solostar Pen   Hyperlipidemia associated with type 2 diabetes mellitus (HCC)   Relevant Medications   insulin  glargine, 1 Unit Dial, (TOUJEO  SOLOSTAR) 300 UNIT/ML Solostar Pen   Other Relevant Orders   Bayer DCA Hb A1c Waived   CBC with Differential/Platelet   CMP14+EGFR   Lipid panel   TSH   Microalbumin/Creatinine Ratio, Urine   Other Visit Diagnoses       Mixed hyperlipidemia       Relevant Orders   Bayer DCA Hb A1c Waived    CBC with Differential/Platelet   CMP14+EGFR   Lipid panel   TSH     Patient did not tolerate Ozempic  so we will stop the Ozempic  and go back to Toujeo  and start at 12 units daily and see how he does and then increase from there.  Blood pressure and everything else looks good today.  He says he does have a lot of decreased energy since he started the Ozempic  so organ to come off of it.  Follow up plan: Return in about 3 months (around 04/07/2024), or if symptoms worsen or fail to improve, for Diabetes recheck.  Counseling provided for all of the vaccine components Orders Placed This Encounter  Procedures   Bayer DCA Hb A1c Waived   CBC with Differential/Platelet   CMP14+EGFR   Lipid panel   TSH   Microalbumin/Creatinine Ratio, Urine    Jolyne Needs, MD [facility] 01/06/2024, 3:09 PM

## 2024-01-07 LAB — CBC WITH DIFFERENTIAL/PLATELET
Basophils Absolute: 0.1 10*3/uL (ref 0.0–0.2)
Basos: 1 %
EOS (ABSOLUTE): 0.1 10*3/uL (ref 0.0–0.4)
Eos: 3 %
Hematocrit: 44.6 % (ref 37.5–51.0)
Hemoglobin: 14.5 g/dL (ref 13.0–17.7)
Immature Grans (Abs): 0 10*3/uL (ref 0.0–0.1)
Immature Granulocytes: 0 %
Lymphocytes Absolute: 1.8 10*3/uL (ref 0.7–3.1)
Lymphs: 33 %
MCH: 27.6 pg (ref 26.6–33.0)
MCHC: 32.5 g/dL (ref 31.5–35.7)
MCV: 85 fL (ref 79–97)
Monocytes Absolute: 0.5 10*3/uL (ref 0.1–0.9)
Monocytes: 8 %
Neutrophils Absolute: 3 10*3/uL (ref 1.4–7.0)
Neutrophils: 55 %
Platelets: 190 10*3/uL (ref 150–450)
RBC: 5.25 x10E6/uL (ref 4.14–5.80)
RDW: 13.7 % (ref 11.6–15.4)
WBC: 5.5 10*3/uL (ref 3.4–10.8)

## 2024-01-07 LAB — LIPID PANEL
Chol/HDL Ratio: 4.8 ratio (ref 0.0–5.0)
Cholesterol, Total: 171 mg/dL (ref 100–199)
HDL: 36 mg/dL — ABNORMAL LOW (ref >39)
LDL Chol Calc (NIH): 83 mg/dL (ref 0–99)
Triglycerides: 321 mg/dL — ABNORMAL HIGH (ref 0–149)
VLDL Cholesterol Cal: 52 mg/dL — ABNORMAL HIGH (ref 5–40)

## 2024-01-07 LAB — CMP14+EGFR
ALT: 26 IU/L (ref 0–44)
AST: 23 IU/L (ref 0–40)
Albumin: 4.1 g/dL (ref 3.9–4.9)
Alkaline Phosphatase: 42 IU/L — ABNORMAL LOW (ref 44–121)
BUN/Creatinine Ratio: 11 (ref 10–24)
BUN: 13 mg/dL (ref 8–27)
Bilirubin Total: <0.2 mg/dL (ref 0.0–1.2)
CO2: 24 mmol/L (ref 20–29)
Calcium: 9.2 mg/dL (ref 8.6–10.2)
Chloride: 102 mmol/L (ref 96–106)
Creatinine, Ser: 1.15 mg/dL (ref 0.76–1.27)
Globulin, Total: 2.3 g/dL (ref 1.5–4.5)
Glucose: 148 mg/dL — ABNORMAL HIGH (ref 70–99)
Potassium: 4.9 mmol/L (ref 3.5–5.2)
Sodium: 138 mmol/L (ref 134–144)
Total Protein: 6.4 g/dL (ref 6.0–8.5)
eGFR: 72 mL/min/{1.73_m2} (ref >59)

## 2024-01-07 LAB — TSH: TSH: 2.78 u[IU]/mL (ref 0.450–4.500)

## 2024-01-16 ENCOUNTER — Telehealth: Payer: Self-pay | Admitting: Pharmacy Technician

## 2024-01-16 NOTE — Progress Notes (Signed)
   01/16/2024  Patient ID: Joseph [name], male   DOB: 11-02-1960, 63 y.o.   MRN: [id number]  Patient engaged with clinical pharmacist for management of diabetes on 12/19/2023. Outreach by [employer] technician was requested.   Outreached patient to discuss diabetes medication management. Left voicemail for patient to return my call at their convenience.   Venus Ruhe, CPhT [facility] Office: [phone] Email: [email or web address]

## 2024-01-19 ENCOUNTER — Ambulatory Visit: Payer: Self-pay | Admitting: Family Medicine

## 2024-01-23 ENCOUNTER — Telehealth: Payer: Self-pay | Admitting: Pharmacy Technician

## 2024-01-23 NOTE — Progress Notes (Signed)
   01/23/2024  Patient ID: Joseph [name], male   DOB: 05-22-1961, 64 y.o.   MRN: [id number]  Patient engaged with clinical pharmacist for management of diabetes on 12/19/2023. Outreach by [employer] technician was requested.   Outreached patient to discuss diabetes medication management. Left voicemail for patient to return my call at their convenience.    Joseph [name], CPhT [facility] Office: [phone] Email: [name]

## 2024-01-26 ENCOUNTER — Telehealth: Payer: Self-pay | Admitting: Pharmacy Technician

## 2024-01-26 NOTE — Progress Notes (Signed)
   01/26/2024  Patient ID: Joseph [name], male   DOB: [birth year], 64 y.o.   MRN: [id number]  Patient engaged with clinical pharmacist for management of diabetes on 12/19/2023. Outreach by [employer] technician was requested.   Outreached patient to discuss diabetes medication management. Left voicemail for patient to return my call at their convenience.   Maybelle Depaoli, CPhT [facility] Office: [phone] Email: [email or web address]

## 2024-01-27 ENCOUNTER — Encounter: Payer: Self-pay | Admitting: Family Medicine

## 2024-02-02 DIAGNOSIS — Z930 Tracheostomy status: Secondary | ICD-10-CM | POA: Diagnosis not present

## 2024-02-04 ENCOUNTER — Other Ambulatory Visit: Payer: Self-pay | Admitting: Family Medicine

## 2024-02-06 DIAGNOSIS — G894 Chronic pain syndrome: Secondary | ICD-10-CM | POA: Diagnosis not present

## 2024-02-06 DIAGNOSIS — E1142 Type 2 diabetes mellitus with diabetic polyneuropathy: Secondary | ICD-10-CM | POA: Diagnosis not present

## 2024-02-06 DIAGNOSIS — K5900 Constipation, unspecified: Secondary | ICD-10-CM | POA: Diagnosis not present

## 2024-02-06 DIAGNOSIS — M47817 Spondylosis without myelopathy or radiculopathy, lumbosacral region: Secondary | ICD-10-CM | POA: Diagnosis not present

## 2024-02-09 ENCOUNTER — Other Ambulatory Visit (INDEPENDENT_AMBULATORY_CARE_PROVIDER_SITE_OTHER): Admitting: Pharmacist

## 2024-02-09 DIAGNOSIS — E119 Type 2 diabetes mellitus without complications: Secondary | ICD-10-CM

## 2024-02-09 DIAGNOSIS — Z794 Long term (current) use of insulin: Secondary | ICD-10-CM

## 2024-02-09 NOTE — Progress Notes (Signed)
 02/09/2024 Name: Joseph [name] MRN: [id number] DOB: 08-14-1960  Chief Complaint  Patient presents with   Diabetes    Joseph [name] is a 63 y.o. year old male who presented for a telephone visit. I connected with  Joseph [name] on 02/09/24 by telephone and verified that I am speaking with the correct person using two identifiers. I discussed the limitations of evaluation and management by telemedicine. The patient expressed understanding and agreed to proceed.  Patient was located in her home and PharmD in PCP office during this visit.   They were referred to the pharmacist by their PCP for assistance in managing diabetes.    Subjective: Patient reports he is doing well.  He was unable to tolerate Ozempic  due to fatigue/weakness/GI intolerance.  He continues on all other medications as prescribed and is compliant.   Care Team: Primary Care Provider: Dettinger, Fonda LABOR, MD ; Next Scheduled Visit: 04/09/24   Medication Access/Adherence  [phone] [place] [place] - [address] [place] [address] Phone: [phone] Fax: [phone]   Patient reports affordability concerns with their medications: No --has LIS/extra help Patient reports access/transportation concerns to their pharmacy: No  Patient reports adherence concerns with their medications:  No     Diabetes:  Current medications: Farxiga , Toujeo  12 units daily Medications tried in the past:  Can't tolerate Ozempic  or metformin   Current glucose readings: accuchek GUIDE testing FBG daily    Patient denies hypoglycemic s/sx including dizziness, shakiness, sweating. Patient denies hyperglycemic symptoms including polyuria, polydipsia, polyphagia, nocturia, neuropathy, blurred vision.  Current meal patterns:  Discussed meal planning options and Plate method for healthy eating Avoid sugary drinks and desserts Incorporate balanced protein, non  starchy veggies, 1 serving of carbohydrate with each meal Increase water intake Increase physical activity as able  Current medication access support: LIS/extra help   Objective:  Lab Results  Component Value Date   HGBA1C 8.1 (H) 01/06/2024   Lab Results  Component Value Date   CREATININE 1.15 01/06/2024   BUN 13 01/06/2024   NA 138 01/06/2024   K 4.9 01/06/2024   CL 102 01/06/2024   CO2 24 01/06/2024    Lab Results  Component Value Date   CHOL 171 01/06/2024   HDL 36 (L) 01/06/2024   LDLCALC 83 01/06/2024   TRIG 321 (H) 01/06/2024   CHOLHDL 4.8 01/06/2024    Medications Reviewed Today     Reviewed by Billee Mliss BIRCH, [facility] (Pharmacist) on 02/09/24 at 1209  Med List Status: <None>   Medication Order Taking? Sig Documenting Provider Last Dose Status Informant  albuterol  (VENTOLIN  HFA) 108 (90 Base) MCG/ACT inhaler [id number]  INHALE 1-2 PUFFS BY MOUTH EVERY 6 HOURS AS NEEDED FOR WHEEZE OR SHORTNESS OF BREATH Dettinger, Fonda LABOR, MD  Active   AMITIZA  24 MCG capsule [id number]  Take 24 mcg by mouth 2 (two) times daily. [provider]  Active   ARIPiprazole  (ABILIFY ) 2 MG tablet [id number]  Take 1 tablet (2 mg total) by mouth at bedtime. Barbra Jayson LABOR, MD  Active   Cholecalciferol (VITAMIN D3) 5000 units CAPS [id number]  Take 5,000 Units by mouth. [provider]  Active   dapagliflozin  propanediol (FARXIGA ) 10 MG TABS tablet [id number]  Take 1 tablet (10 mg total) by mouth daily before breakfast. Dettinger, Fonda LABOR, MD  Active   fenofibrate  160 MG tablet [id number]  Take 1 tablet (160 mg total) by mouth daily. Dettinger,  Fonda LABOR, MD  Active   finasteride  (PROSCAR ) 5 MG tablet [id number]  Take 1 tablet (5 mg total) by mouth daily. Dettinger, Fonda LABOR, MD  Active   glucose blood (ACCU-CHEK AVIVA PLUS) test strip [id number]  Test BS up to 3 times daily Dx E11.22 Dettinger, Fonda LABOR, MD  Active   hydrOXYzine  (VISTARIL ) 25 MG capsule [id number]  Take 1-2 capsules  (25-50 mg total) by mouth at bedtime as needed for anxiety (insomnia). Barbra Jayson LABOR, MD  Active   insulin  glargine, 1 Unit Dial, (TOUJEO  SOLOSTAR) 300 UNIT/ML Solostar Pen [id number]  Inject 12-14 Units into the skin daily at 12 noon. Dettinger, Fonda LABOR, MD  Active   levothyroxine  (SYNTHROID ) 150 MCG tablet [id number]  Take 1 tablet (150 mcg total) by mouth daily before breakfast. Dettinger, Fonda LABOR, MD  Active   mirtazapine  (REMERON ) 45 MG tablet [id number]  Take 1 tablet (45 mg total) by mouth at bedtime. Barbra Jayson LABOR, MD  Active   morphine  (MS CONTIN ) 30 MG 12 hr tablet [id number]  Take 30 mg by mouth every 8 (eight) hours. [provider]  Active   oxyCODONE -acetaminophen  (PERCOCET) 7.5-325 MG tablet [id number]  TAKE 1 TABLET BY MOUTH EVERY FOUR HOURS AS NEEDED FOR PAIN [provider]  Active   pantoprazole  (PROTONIX ) 20 MG tablet [id number]  Take 1 tablet (20 mg total) by mouth daily. Dettinger, Fonda LABOR, MD  Active   pregabalin (LYRICA) 75 MG capsule [id number]  Take 75 mg by mouth 3 (three) times daily. [provider]  Active   rizatriptan  (MAXALT ) 10 MG tablet [id number]  TAKE 1 TABLET BY MOUTH AS NEEDED FOR MIGRAINE. MAY REPEAT IN 2 HOURS IF NEEDED Dettinger, Fonda LABOR, MD  Active   rosuvastatin  (CRESTOR ) 10 MG tablet [id number]  TAKE 1 TABLET BY MOUTH EVERY DAY Dettinger, Fonda LABOR, MD  Active   sertraline  (ZOLOFT ) 100 MG tablet [id number]  Take 1 tablet (100 mg total) by mouth daily. Barbra Jayson LABOR, MD  Active   tamsulosin  (FLOMAX ) 0.4 MG CAPS capsule [id number]  TAKE 1 CAPSULE BY MOUTH EVERY DAY Dettinger, Fonda LABOR, MD  Active   Tiotropium Bromide  Monohydrate (SPIRIVA  RESPIMAT) 2.5 MCG/ACT AERS [id number]  Inhale 2 puffs into the lungs daily. Dettinger, Fonda LABOR, MD  Active   tiZANidine (ZANAFLEX) 2 MG tablet [id number]  Take 2 mg by mouth 3 (three) times daily as needed for muscle spasms. [provider]  Active   trazodone  (DESYREL ) 300 MG tablet  [id number]  TAKE 1 TABLET BY MOUTH EVERYDAY AT BEDTIME Dettinger, Fonda LABOR, MD  Active               Assessment/Plan:   Diabetes: - Currently uncontrolled - Reviewed long term cardiovascular and renal outcomes of uncontrolled blood sugar - Reviewed goal A1c, goal fasting, and goal 2 hour post prandial glucose - Recommend to: Continue Farxiga  10mg  daily Continue Toujeo  12 units daily  If FBG rises/stays about 150, INCREASE Toujeo  to 14 untis daily ONLY if FBG rise above 150 Respectfully declined CGM - Patient denies personal or family history of multiple endocrine neoplasia type 2, medullary thyroid  cancer; personal history of pancreatitis or gallbladder disease. - Recommend to check glucose daily (fasting) or if symptomatic   Follow Up Plan: PCP, PharmD as needed   Mliss Tarry Griffin, PharmD, BCACP, CPP Clinical Pharmacist, [facility]

## 2024-02-13 ENCOUNTER — Other Ambulatory Visit: Payer: Self-pay | Admitting: Family Medicine

## 2024-02-20 ENCOUNTER — Encounter: Payer: Self-pay | Admitting: Pharmacist

## 2024-02-20 NOTE — Progress Notes (Signed)
 Pharmacy Quality Measure Review  This patient is appearing on a report for being at risk of failing the adherence measure for cholesterol (statin) medications this calendar year.   Medication: rosuvastatin  10 mg daily Last fill date: 01/05/24 for 90 day supply  Insurance report was not up to date. No action needed at this time.   Catie IVAR Centers, PharmD, [facility] Clinical Pharmacist [phone]

## 2024-02-21 ENCOUNTER — Encounter: Payer: Self-pay | Admitting: Family Medicine

## 2024-02-28 ENCOUNTER — Other Ambulatory Visit: Payer: Self-pay | Admitting: Family Medicine

## 2024-02-28 DIAGNOSIS — N401 Enlarged prostate with lower urinary tract symptoms: Secondary | ICD-10-CM

## 2024-03-07 DIAGNOSIS — G894 Chronic pain syndrome: Secondary | ICD-10-CM | POA: Diagnosis not present

## 2024-03-07 DIAGNOSIS — M47817 Spondylosis without myelopathy or radiculopathy, lumbosacral region: Secondary | ICD-10-CM | POA: Diagnosis not present

## 2024-03-07 DIAGNOSIS — K5900 Constipation, unspecified: Secondary | ICD-10-CM | POA: Diagnosis not present

## 2024-03-07 DIAGNOSIS — E1142 Type 2 diabetes mellitus with diabetic polyneuropathy: Secondary | ICD-10-CM | POA: Diagnosis not present

## 2024-03-16 ENCOUNTER — Encounter: Payer: Self-pay | Admitting: Family Medicine

## 2024-04-05 DIAGNOSIS — K5900 Constipation, unspecified: Secondary | ICD-10-CM | POA: Diagnosis not present

## 2024-04-05 DIAGNOSIS — E1142 Type 2 diabetes mellitus with diabetic polyneuropathy: Secondary | ICD-10-CM | POA: Diagnosis not present

## 2024-04-05 DIAGNOSIS — G894 Chronic pain syndrome: Secondary | ICD-10-CM | POA: Diagnosis not present

## 2024-04-05 DIAGNOSIS — M47817 Spondylosis without myelopathy or radiculopathy, lumbosacral region: Secondary | ICD-10-CM | POA: Diagnosis not present

## 2024-04-09 ENCOUNTER — Encounter: Payer: Self-pay | Admitting: Family Medicine

## 2024-04-09 ENCOUNTER — Ambulatory Visit: Admitting: Family Medicine

## 2024-04-09 VITALS — BP 142/83 | HR 72 | Temp 97.6°F | Ht 74.0 in | Wt 245.4 lb

## 2024-04-09 DIAGNOSIS — Z794 Long term (current) use of insulin: Secondary | ICD-10-CM

## 2024-04-09 DIAGNOSIS — E039 Hypothyroidism, unspecified: Secondary | ICD-10-CM | POA: Diagnosis not present

## 2024-04-09 DIAGNOSIS — Z23 Encounter for immunization: Secondary | ICD-10-CM

## 2024-04-09 DIAGNOSIS — E1159 Type 2 diabetes mellitus with other circulatory complications: Secondary | ICD-10-CM | POA: Diagnosis not present

## 2024-04-09 DIAGNOSIS — I152 Hypertension secondary to endocrine disorders: Secondary | ICD-10-CM

## 2024-04-09 DIAGNOSIS — E785 Hyperlipidemia, unspecified: Secondary | ICD-10-CM

## 2024-04-09 DIAGNOSIS — E1169 Type 2 diabetes mellitus with other specified complication: Secondary | ICD-10-CM | POA: Diagnosis not present

## 2024-04-09 DIAGNOSIS — R3911 Hesitancy of micturition: Secondary | ICD-10-CM

## 2024-04-09 DIAGNOSIS — N1831 Chronic kidney disease, stage 3a: Secondary | ICD-10-CM | POA: Diagnosis not present

## 2024-04-09 DIAGNOSIS — E782 Mixed hyperlipidemia: Secondary | ICD-10-CM | POA: Diagnosis not present

## 2024-04-09 DIAGNOSIS — E0822 Diabetes mellitus due to underlying condition with diabetic chronic kidney disease: Secondary | ICD-10-CM

## 2024-04-09 DIAGNOSIS — E119 Type 2 diabetes mellitus without complications: Secondary | ICD-10-CM

## 2024-04-09 DIAGNOSIS — N401 Enlarged prostate with lower urinary tract symptoms: Secondary | ICD-10-CM

## 2024-04-09 DIAGNOSIS — Z930 Tracheostomy status: Secondary | ICD-10-CM | POA: Diagnosis not present

## 2024-04-09 LAB — BAYER DCA HB A1C WAIVED: HB A1C (BAYER DCA - WAIVED): 8 % — ABNORMAL HIGH (ref 4.8–5.6)

## 2024-04-09 MED ORDER — TAMSULOSIN HCL 0.4 MG PO CAPS
0.4000 mg | ORAL_CAPSULE | Freq: Every day | ORAL | 3 refills | Status: AC
Start: 2024-04-09 — End: ?

## 2024-04-09 MED ORDER — FREESTYLE LIBRE 3 PLUS SENSOR MISC: 5 refills | Status: AC

## 2024-04-09 MED ORDER — ROSUVASTATIN CALCIUM 10 MG PO TABS: 10.0000 mg | ORAL_TABLET | Freq: Every day | ORAL | 3 refills | Status: AC

## 2024-04-09 MED ORDER — ACCU-CHEK AVIVA PLUS W/DEVICE KIT
1.0000 | PACK | Freq: Three times a day (TID) | 1 refills | Status: AC
Start: 2024-04-09 — End: ?

## 2024-04-09 MED ORDER — FREESTYLE LIBRE 3 READER DEVI: 1.0000 | Freq: Four times a day (QID) | 1 refills | Status: AC

## 2024-04-09 MED ORDER — DAPAGLIFLOZIN PROPANEDIOL 10 MG PO TABS: 10.0000 mg | ORAL_TABLET | Freq: Every day | ORAL | 5 refills | Status: AC

## 2024-04-09 NOTE — Progress Notes (Signed)
 BP (!) 142/83   Pulse 72   Temp 97.6 F (36.4 C)   Ht 6' 2 (1.88 m)   Wt 245 lb 6.4 oz (111.3 kg)   SpO2 94%   BMI 31.51 kg/m    Subjective:   Patient ID: Joseph [name], male    DOB: 1960-08-28, 63 y.o.   MRN: [id number]  HPI: Joseph [name] is a 63 y.o. male presenting on 04/09/2024 for Medical Management of Chronic Issues, Diabetes, and Hyperlipidemia   Discussed the use of AI scribe software for clinical note transcription with the patient, who gave verbal consent to proceed.  History of Present Illness   Joseph [name] is a 63 year old male with diabetes, chronic kidney disease, hyperlipidemia, hypertension, and benign prostatic hyperplasia who presents for management of chronic medical issues.  He has ongoing issues with diabetes management, with blood sugar levels often in the 150s to 160s and an A1c of 8.0. He uses Toujeo  and Farxiga , adjusting his Toujeo  dose based on blood sugar levels, taking 12 units if his blood sugar is 150 or below, and 14 units if it is above 150. No recent hypoglycemic episodes have occurred.  He is on medication for these conditions, though specific medications for hyperlipidemia and hypertension were not detailed.  He experiences sleep disturbances, characterized by difficulty staying asleep. He uses trazodone  nightly, but it is not effective in maintaining sleep. He wakes up once or twice to urinate, which may contribute to his sleep issues. He also experiences tinnitus, which he manages with a fan for white noise.  He continues to take his thyroid  medication without issues. He also takes Abilify  in the evening, which was started two visits ago, but he does not feel it addresses a nerve problem.  He has benign prostatic hyperplasia with urinary symptoms.          Relevant past medical, surgical, family and social history reviewed and updated as indicated. Interim medical history since our last visit reviewed. Allergies and  medications reviewed and updated.  Review of Systems  Constitutional:  Negative for chills and fever.  Eyes:  Negative for visual disturbance.  Respiratory:  Negative for shortness of breath and wheezing.   Cardiovascular:  Negative for chest pain and leg swelling.  Musculoskeletal:  Negative for back pain and gait problem.  Skin:  Negative for rash.  Neurological:  Negative for dizziness and light-headedness.  All other systems reviewed and are negative.   Per HPI unless specifically indicated above   Allergies as of 04/09/2024       Reactions   Lisinopril  Other (See Comments)   Dizziness    Metformin  And Related Diarrhea   GI intolerance   Ozempic  (0.25 Or 0.5 Mg-dose) [semaglutide (0.25 Or 0.5mg -dos)] Other (See Comments)   Weakness; GI intolerance        Medication List        Accurate as of April 09, 2024  3:27 PM. If you have any questions, ask your nurse or doctor.          STOP taking these medications    tiZANidine 2 MG tablet Commonly known as: ZANAFLEX Stopped by: Fonda LABOR Dairon Procter       TAKE these medications    Accu-Chek Aviva Plus test strip Generic drug: glucose blood Test BS up to 3 times daily Dx E11.22   Accu-Chek Aviva Plus w/Device Kit 1 each by Does not apply route 3 (three) times daily. Started by: [facility]onda LABOR Larissa Pegg  albuterol  108 (90 Base) MCG/ACT inhaler Commonly known as: VENTOLIN  HFA INHALE 1-2 PUFFS BY MOUTH EVERY 6 HOURS AS NEEDED FOR WHEEZE OR SHORTNESS OF BREATH   Amitiza  24 MCG capsule Generic drug: lubiprostone  Take 24 mcg by mouth 2 (two) times daily.   ARIPiprazole  2 MG tablet Commonly known as: ABILIFY  Take 1 tablet (2 mg total) by mouth at bedtime.   dapagliflozin  propanediol 10 MG Tabs tablet Commonly known as: Farxiga  Take 1 tablet (10 mg total) by mouth daily before breakfast.   fenofibrate  160 MG tablet Take 1 tablet (160 mg total) by mouth daily.   finasteride  5 MG tablet Commonly known as:  PROSCAR  Take 1 tablet (5 mg total) by mouth daily.   FreeStyle Libre 3 Plus Sensor Misc Change sensor every 15 days. Started by: [facility]onda LABOR Rayven Hendrickson   FreeStyle Libre 3 Reader Devi 1 each by Does not apply route 4 (four) times daily. Started by: [facility] LABOR Ander Wamser   hydrOXYzine  25 MG capsule Commonly known as: VISTARIL  Take 1-2 capsules (25-50 mg total) by mouth at bedtime as needed for anxiety (insomnia).   levothyroxine  150 MCG tablet Commonly known as: SYNTHROID  Take 1 tablet (150 mcg total) by mouth daily before breakfast.   mirtazapine  45 MG tablet Commonly known as: REMERON  Take 1 tablet (45 mg total) by mouth at bedtime.   morphine  30 MG 12 hr tablet Commonly known as: MS CONTIN  Take 30 mg by mouth every 8 (eight) hours.   oxyCODONE -acetaminophen  7.5-325 MG tablet Commonly known as: PERCOCET TAKE 1 TABLET BY MOUTH EVERY FOUR HOURS AS NEEDED FOR PAIN   pantoprazole  20 MG tablet Commonly known as: PROTONIX  Take 1 tablet (20 mg total) by mouth daily.   pregabalin 75 MG capsule Commonly known as: LYRICA Take 75 mg by mouth 3 (three) times daily.   rizatriptan  10 MG tablet Commonly known as: MAXALT  TAKE 1 TABLET BY MOUTH AS NEEDED FOR MIGRAINE. MAY REPEAT IN 2 HOURS IF NEEDED   rosuvastatin  10 MG tablet Commonly known as: CRESTOR  Take 1 tablet (10 mg total) by mouth daily.   sertraline  100 MG tablet Commonly known as: ZOLOFT  Take 1 tablet (100 mg total) by mouth daily.   Spiriva  Respimat 2.5 MCG/ACT Aers Generic drug: Tiotropium Bromide  Monohydrate Inhale 2 puffs into the lungs daily.   tamsulosin  0.4 MG Caps capsule Commonly known as: FLOMAX  Take 1 capsule (0.4 mg total) by mouth daily.   Toujeo  SoloStar 300 UNIT/ML Solostar Pen Generic drug: insulin  glargine (1 Unit Dial) Inject 12-14 Units into the skin daily at 12 noon.   trazodone  300 MG tablet Commonly known as: DESYREL  TAKE 1 TABLET BY MOUTH EVERYDAY AT BEDTIME   Vitamin D3 125 MCG (5000 UT)  Caps Take 5,000 Units by mouth.         Objective:   BP (!) 142/83   Pulse 72   Temp 97.6 F (36.4 C)   Ht 6' 2 (1.88 m)   Wt 245 lb 6.4 oz (111.3 kg)   SpO2 94%   BMI 31.51 kg/m   Wt Readings from Last 3 Encounters:  04/09/24 245 lb 6.4 oz (111.3 kg)  01/06/24 238 lb (108 kg)  10/05/23 246 lb (111.6 kg)    Physical Exam Physical Exam   CHEST: Lungs clear to auscultation bilaterally. CARDIOVASCULAR: Regular heart rate and rhythm, no murmurs. EXTREMITIES: No edema, good peripheral pulses.     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    Assessment & Plan:  Problem List Items Addressed This Visit       Cardiovascular and Mediastinum   Hypertension associated with diabetes (HCC)   Relevant Medications   dapagliflozin  propanediol (FARXIGA ) 10 MG TABS tablet   rosuvastatin  (CRESTOR ) 10 MG tablet     Endocrine   Hypothyroidism   Diabetes mellitus with chronic kidney disease (HCC)   Relevant Medications   dapagliflozin  propanediol (FARXIGA ) 10 MG TABS tablet   rosuvastatin  (CRESTOR ) 10 MG tablet   Continuous Glucose Sensor (FREESTYLE LIBRE 3 PLUS SENSOR) MISC   Continuous Glucose Receiver (FREESTYLE LIBRE 3 READER) DEVI   Blood Glucose Monitoring Suppl (ACCU-CHEK AVIVA PLUS) w/Device KIT   Hyperlipidemia associated with type 2 diabetes mellitus (HCC)   Relevant Medications   dapagliflozin  propanediol (FARXIGA ) 10 MG TABS tablet   rosuvastatin  (CRESTOR ) 10 MG tablet   Other Visit Diagnoses       Type 2 diabetes mellitus without complication, unspecified whether long term insulin  use (HCC)    -  Primary   Relevant Medications   dapagliflozin  propanediol (FARXIGA ) 10 MG TABS tablet   rosuvastatin  (CRESTOR ) 10 MG tablet   Continuous Glucose Sensor (FREESTYLE LIBRE 3 PLUS SENSOR) MISC   Continuous Glucose Receiver (FREESTYLE LIBRE 3 READER) DEVI   Blood Glucose Monitoring Suppl (ACCU-CHEK AVIVA PLUS) w/Device KIT   Other Relevant Orders    Bayer DCA Hb A1c Waived     Encounter for immunization       Relevant Orders   Flu vaccine trivalent PF, 6mos and older(Flulaval,Afluria,Fluarix,Fluzone) (Completed)     Need for Streptococcus pneumoniae vaccination       Relevant Orders   Pneumococcal conjugate vaccine 20-valent (Prevnar 20) (Completed)     Mixed hyperlipidemia       Relevant Medications   rosuvastatin  (CRESTOR ) 10 MG tablet     Benign prostatic hyperplasia with urinary hesitancy       Relevant Medications   tamsulosin  (FLOMAX ) 0.4 MG CAPS capsule          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 Blood sugars elevated, A1c 8.0. On Toujeo  and Farxiga . No recent hypoglycemia. Discussed Freestyle Libre CGM for better management. - Increase Toujeo  to 14 units, monitor for hypoglycemia. - Prescribe Freestyle Libre CGM, check insurance coverage. - Provide sample sensor for [employer] if available. - Prescribe new AccuCheck meter if CGM not covered.  Benign prostatic hyperplasia with lower urinary tract symptoms Benign prostatic hyperplasia with nocturia causing sleep disturbances.  Hypothyroidism Hypothyroidism managed with current medication.  Mixed hyperlipidemia Mixed hyperlipidemia.  Hypertension Hypertension associated with diabetes and hyperlipidemia.  Insomnia Insomnia with difficulty staying asleep. Trazodone  ineffective. Nocturia and tinnitus possible factors. Discussed white noise use. Future sleep study considered if persistent. - Consider white noise machine or static noise for sleep aid. - Consider future sleep study if insomnia persists.       Follow up plan: Return in about 3 months (around 07/09/2024), or if symptoms worsen or fail to improve, for Diabetes recheck.  Counseling provided for all of the vaccine components Orders Placed This Encounter  Procedures   Flu vaccine trivalent PF, 6mos and  older(Flulaval,Afluria,Fluarix,Fluzone)   Pneumococcal conjugate vaccine 20-valent (Prevnar 20)   Bayer DCA Hb A1c Waived    Fonda Levins, MD [facility] 04/09/2024, 3:27 PM

## 2024-05-01 ENCOUNTER — Encounter: Payer: Self-pay | Admitting: Family Medicine

## 2024-05-09 NOTE — Progress Notes (Signed)
 Joseph [name]                                          MRN: [id number]   05/09/2024   The [facility] Quality Team Specialist reviewed this patient medical record for the purposes of chart review for care gap closure. The following were reviewed: abstraction for care gap closure-diabetic eye exam.    [facility] Quality Team

## 2024-05-09 NOTE — Progress Notes (Signed)
 LAMAJ METOYER                                          MRN: [id number]   05/09/2024   The [facility] Quality Team Specialist reviewed this patient medical record for the purposes of chart review for care gap closure. The following were reviewed: chart review for care gap closure-kidney health evaluation for diabetes:eGFR  and uACR.    [facility] Quality Team

## 2024-05-09 NOTE — Progress Notes (Signed)
 Joseph [name]                                          MRN: [id number]   05/09/2024   The [facility] Quality Team Specialist reviewed this patient medical record for the purposes of chart review for care gap closure. The following were reviewed: abstraction for care gap closure-glycemic status assessment.    [facility] Quality Team

## 2024-05-10 ENCOUNTER — Ambulatory Visit

## 2024-05-10 VITALS — BP 142/83 | HR 72 | Ht 74.0 in | Wt 245.0 lb

## 2024-05-10 DIAGNOSIS — G894 Chronic pain syndrome: Secondary | ICD-10-CM | POA: Diagnosis not present

## 2024-05-10 DIAGNOSIS — Z0000 Encounter for general adult medical examination without abnormal findings: Secondary | ICD-10-CM

## 2024-05-10 DIAGNOSIS — M47817 Spondylosis without myelopathy or radiculopathy, lumbosacral region: Secondary | ICD-10-CM | POA: Diagnosis not present

## 2024-05-10 DIAGNOSIS — Z1211 Encounter for screening for malignant neoplasm of colon: Secondary | ICD-10-CM

## 2024-05-10 DIAGNOSIS — K5900 Constipation, unspecified: Secondary | ICD-10-CM | POA: Diagnosis not present

## 2024-05-10 DIAGNOSIS — Z79891 Long term (current) use of opiate analgesic: Secondary | ICD-10-CM | POA: Diagnosis not present

## 2024-05-10 DIAGNOSIS — E1142 Type 2 diabetes mellitus with diabetic polyneuropathy: Secondary | ICD-10-CM | POA: Diagnosis not present

## 2024-05-10 NOTE — Progress Notes (Signed)
 Subjective:   Joseph [name] is a 63 y.o. who presents for a Medicare Wellness preventive visit.  As a reminder, Annual Wellness Visits don't include a physical exam, and some assessments may be limited, especially if this visit is performed virtually. We may recommend an in-person follow-up visit with your provider if needed.  Visit Complete: Virtual I connected with  Carlin LELON Rundle on 05/10/24 by a audio enabled telemedicine application and verified that I am speaking with the correct person using two identifiers.  Patient Location: Home  Provider Location: Home Office  I discussed the limitations of evaluation and management by telemedicine. The patient expressed understanding and agreed to proceed.  Vital Signs: Because this visit was a virtual/telehealth visit, some criteria may be missing or patient reported. Any vitals not documented were not able to be obtained and vitals that have been documented are patient reported.  VideoDeclined- This patient declined Librarian, academic. Therefore the visit was completed with audio only.  Persons Participating in Visit: Patient.  AWV Questionnaire: No: Patient Medicare AWV questionnaire was not completed prior to this visit.  Cardiac Risk Factors include: advanced age (>79men, >38 women);diabetes mellitus;dyslipidemia;hypertension;male gender;smoking/ tobacco exposure     Objective:    Today's Vitals   05/10/24 1318  BP: (!) 142/83  Pulse: 72  Weight: 245 lb (111.1 kg)  Height: 6' 2 (1.88 m)   Body mass index is 31.46 kg/m.     05/10/2024    1:15 PM 03/07/2023    1:10 PM 03/03/2022    3:18 PM 03/02/2021    4:25 PM 03/09/2019    2:39 PM 03/07/2018    3:29 PM 02/07/2017    8:18 AM  Advanced Directives  Does Patient Have a Medical Advance Directive? No Yes No Yes Yes Yes  No   Type of Special educational needs teacher of [place];Living will  Healthcare Power of [place];Living will  Healthcare Power of [place];Living will Healthcare Power of [place];Living will   Does patient want to make changes to medical advance directive?     No - Patient declined  No - Patient declined    Copy of Healthcare Power of Attorney in Chart?  No - copy requested  No - copy requested No - copy requested  No - copy requested    Would patient like information on creating a medical advance directive?   No - Patient declined    No - Patient declined      Data saved with a previous flowsheet row definition    Current Medications (verified) Outpatient Encounter Medications as of 05/10/2024  Medication Sig   albuterol  (VENTOLIN  HFA) 108 (90 Base) MCG/ACT inhaler INHALE 1-2 PUFFS BY MOUTH EVERY 6 HOURS AS NEEDED FOR WHEEZE OR SHORTNESS OF BREATH   AMITIZA  24 MCG capsule Take 24 mcg by mouth 2 (two) times daily.   ARIPiprazole  (ABILIFY ) 2 MG tablet Take 1 tablet (2 mg total) by mouth at bedtime.   Blood Glucose Monitoring Suppl (ACCU-CHEK AVIVA PLUS) w/Device KIT 1 each by Does not apply route 3 (three) times daily.   Cholecalciferol (VITAMIN D3) 5000 units CAPS Take 5,000 Units by mouth.   Continuous Glucose Receiver (FREESTYLE LIBRE 3 READER) DEVI 1 each by Does not apply route 4 (four) times daily.   Continuous Glucose Sensor (FREESTYLE LIBRE 3 PLUS SENSOR) MISC Change sensor every 15 days.   dapagliflozin  propanediol (FARXIGA ) 10 MG TABS tablet Take 1 tablet (10 mg total) by mouth daily before breakfast.  fenofibrate  160 MG tablet Take 1 tablet (160 mg total) by mouth daily.   finasteride  (PROSCAR ) 5 MG tablet Take 1 tablet (5 mg total) by mouth daily.   glucose blood (ACCU-CHEK AVIVA PLUS) test strip Test BS up to 3 times daily Dx E11.22   hydrOXYzine  (VISTARIL ) 25 MG capsule Take 1-2 capsules (25-50 mg total) by mouth at bedtime as needed for anxiety (insomnia).   insulin  glargine, 1 Unit Dial, (TOUJEO  SOLOSTAR) 300 UNIT/ML Solostar Pen Inject 12-14 Units into the skin daily at 12 noon.    levothyroxine  (SYNTHROID ) 150 MCG tablet Take 1 tablet (150 mcg total) by mouth daily before breakfast.   mirtazapine  (REMERON ) 45 MG tablet Take 1 tablet (45 mg total) by mouth at bedtime.   morphine  (MS CONTIN ) 30 MG 12 hr tablet Take 30 mg by mouth every 8 (eight) hours.   oxyCODONE -acetaminophen  (PERCOCET) 7.5-325 MG tablet TAKE 1 TABLET BY MOUTH EVERY FOUR HOURS AS NEEDED FOR PAIN   pantoprazole  (PROTONIX ) 20 MG tablet Take 1 tablet (20 mg total) by mouth daily.   pregabalin (LYRICA) 75 MG capsule Take 75 mg by mouth 3 (three) times daily.   rizatriptan  (MAXALT ) 10 MG tablet TAKE 1 TABLET BY MOUTH AS NEEDED FOR MIGRAINE. MAY REPEAT IN 2 HOURS IF NEEDED   rosuvastatin  (CRESTOR ) 10 MG tablet Take 1 tablet (10 mg total) by mouth daily.   sertraline  (ZOLOFT ) 100 MG tablet Take 1 tablet (100 mg total) by mouth daily.   tamsulosin  (FLOMAX ) 0.4 MG CAPS capsule Take 1 capsule (0.4 mg total) by mouth daily.   Tiotropium Bromide  Monohydrate (SPIRIVA  RESPIMAT) 2.5 MCG/ACT AERS Inhale 2 puffs into the lungs daily.   trazodone  (DESYREL ) 300 MG tablet TAKE 1 TABLET BY MOUTH EVERYDAY AT BEDTIME   No facility-administered encounter medications on file as of 05/10/2024.    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   Family History  Problem Relation Age of Onset   Emphysema Sister    Prostate cancer Maternal Grandfather    Clotting disorder Maternal Grandfather    Cancer Maternal Grandmother        brain    Depression Maternal Uncle    Atopy Neg Hx    Social History   Socioeconomic History   Marital status: Married    Spouse name: Mitzie   Number of children: 2   Years of education: 12   Highest education level: 12th grade  Occupational History   Occupation: disability  Tobacco Use   Smoking status: Former    Current packs/day: 0.00    Average packs/day: 3.0 packs/day for 30.0 years (90.0 ttl pk-yrs)    Types: Cigarettes    Start date: 07/19/1973    Quit date: 07/20/2003    Years since quitting: 20.8   Smokeless tobacco: Never  Vaping Use   Vaping status: Never Used  Substance and Sexual Activity   Alcohol use: Not Currently  Comment: per week   Drug use: No   Sexual activity: Yes  Other Topics Concern   Not on file  Social History Narrative   Married with 2 children   Social Drivers of Health   Financial Resource Strain: Low Risk  (05/10/2024)   Overall Financial Resource Strain (CARDIA)    Difficulty of Paying Living Expenses: Not hard at all  Recent Concern: Financial Resource Strain - Medium Risk (04/06/2024)   Overall Financial Resource Strain (CARDIA)    Difficulty of Paying Living Expenses: Somewhat hard  Food Insecurity: No Food Insecurity (05/10/2024)   Hunger Vital Sign    Worried About Running Out of Food in the Last Year: Never true    Ran Out of Food in the Last Year: Never true  Transportation Needs: No Transportation Needs (05/10/2024)   PRAPARE - Administrator, Civil Service (Medical): No    Lack of Transportation (Non-Medical): No  Physical Activity: Inactive (05/10/2024)   Exercise Vital Sign    Days of Exercise per Week: 0 days    Minutes of Exercise per Session: 0 min  Stress: Stress Concern Present (05/10/2024)   [employer] of Occupational Health - Occupational Stress Questionnaire    Feeling of Stress: To some extent  Social Connections: Moderately Isolated (05/10/2024)   Social Connection and Isolation Panel    Frequency  of Communication with Friends and Family: Twice a week    Frequency of Social Gatherings with Friends and Family: Twice a week    Attends Religious Services: Never    Database administrator or Organizations: No    Attends Engineer, structural: Never    Marital Status: Married    Tobacco Counseling Counseling given: Yes    Clinical Intake:  Pre-visit preparation completed: Yes  Pain : No/denies pain     BMI - recorded: 31.46 Nutritional Status: BMI > 30  Obese Nutritional Risks: None Diabetes: Yes  Lab Results  Component Value Date   HGBA1C 8.0 (H) 04/09/2024   HGBA1C 8.1 (H) 01/06/2024   HGBA1C 9.1 (H) 10/05/2023     How often do you need to have someone help you when you read instructions, pamphlets, or other written materials from your doctor or pharmacy?: 1 - Never  Interpreter Needed?: No  Information entered by :: alia t/cma   Activities of Daily Living     05/10/2024    1:12 PM  In your present state of health, do you have any difficulty performing the following activities:  Hearing? 0  Vision? 0  Difficulty concentrating or making decisions? 0  Walking or climbing stairs? 1  Dressing or bathing? 0  Doing errands, shopping? 0  Preparing Food and eating ? N  Using the Toilet? N  In the past six months, have you accidently leaked urine? N  Do you have problems with loss of bowel control? N  Managing your Medications? N  Managing your Finances? N  Housekeeping or managing your Housekeeping? N    Patient Care Team: Dettinger, Fonda LABOR, MD as PCP - General (Family Medicine) Cozetta Back, MD as Referring Physician (Physical Medicine and Rehabilitation) Services, [facility] as Referring Physician (Psychiatry) Nieves Cough, MD as Consulting Physician (Urology)  I have updated your Care Teams any recent Medical Services you may have received from other providers in the past year.     Assessment:   This is a routine wellness  examination for [place].  Hearing/Vision screen Hearing Screening - Comments:: Pt denies  hearing dif  Vision Screening - Comments:: Pt denies vision dif/pt goes to [facility] in [place], [place]/last ov over a yr/only use reading glasses   Goals Addressed   None    Depression Screen     05/10/2024    1:15 PM 04/09/2024    2:48 PM 01/06/2024    2:53 PM 10/05/2023    1:44 PM 07/07/2023    3:08 PM 04/06/2023    2:57 PM 03/07/2023    1:09 PM  PHQ 2/9 Scores  PHQ - 2 Score 0 0 1 0 0 0 0  PHQ- 9 Score 0 2 8   0     Fall Risk     05/10/2024    1:11 PM 04/09/2024    2:48 PM 01/06/2024    2:53 PM 10/05/2023    1:43 PM 07/07/2023    3:08 PM  Fall Risk   Falls in the past year? 0 0 0 0 0  Number falls in past yr: 0 0 0    Injury with Fall? 0 0 0    Risk for fall due to : No Fall Risks No Fall Risks No Fall Risks    Follow up Falls evaluation completed Falls evaluation completed Falls evaluation completed      MEDICARE RISK AT HOME:  Medicare Risk at Home Any stairs in or around the home?: Yes If so, are there any without handrails?: Yes Home free of loose throw rugs in walkways, pet beds, electrical cords, etc?: Yes Adequate lighting in your home to reduce risk of falls?: Yes Life alert?: No Use of a cane, walker or w/c?: No Grab bars in the bathroom?: No Shower chair or bench in shower?: Yes Elevated toilet seat or a handicapped toilet?: No  TIMED UP AND GO:  Was the test performed?  no  Cognitive Function: 6CIT completed    03/07/2018    3:33 PM 11/23/2016    2:13 PM  MMSE - Mini Mental State Exam  Orientation to time 5 5   Orientation to Place 4 5   Registration 3 3   Attention/ Calculation 5 5   Recall 3 3   Language- name 2 objects 2 2   Language- repeat 1 1  Language- follow 3 step command 3 3   Language- read & follow direction 1 1   Write a sentence 1 1   Copy design 1 1   Total score 29 30      Data saved with a previous flowsheet row definition         03/07/2023    1:11 PM 03/03/2022    3:20 PM 03/02/2021    4:25 PM 03/09/2019    2:42 PM  6CIT Screen  What Year? 0 points 0 points 0 points 0 points  What month? 0 points 0 points 0 points 0 points  What time? 0 points 0 points 0 points 0 points  Count back from 20 0 points 0 points 0 points 0 points  Months in reverse 0 points 0 points 0 points 0 points  Repeat phrase 0 points 0 points 0 points 2 points  Total Score 0 points 0 points 0 points 2 points    Immunizations Immunization History  Administered Date(s) Administered   Influenza Nasal 04/28/2012   Influenza, Seasonal, Injecte, Preservative Fre 04/06/2023, 04/09/2024   Influenza,inj,Quad PF,6+ Mos 06/12/2013, 04/14/2016, 07/27/2017, 06/08/2018, 04/09/2019, 08/15/2020, 06/26/2021, 03/31/2022   Influenza-Unspecified 05/19/2009, 05/01/2010, 05/22/2015   PNEUMOCOCCAL CONJUGATE-20 04/09/2024   Pneumococcal Conjugate-13 07/27/2017  Pneumococcal Polysaccharide-23 07/31/2018   Pneumococcal-Unspecified 04/28/2012   Rabies, IM 02/07/2017, 02/10/2017, 02/14/2017, 02/21/2017   Td 12/17/2011   Tdap 12/17/2011, 12/25/2021    Screening Tests Health Maintenance  Topic Date Due   Colonoscopy  Never done   Diabetic kidney evaluation - Urine ACR  09/30/2023   OPHTHALMOLOGY EXAM  04/06/2024   COVID-19 Vaccine (1) 04/25/2025 (Originally 09/05/1965)   Zoster Vaccines- Shingrix (1 of 2) 07/09/2025 (Originally 09/06/1979)   HEMOGLOBIN A1C  10/07/2024   Diabetic kidney evaluation - eGFR measurement  01/05/2025   FOOT EXAM  01/05/2025   Medicare Annual Wellness (AWV)  05/10/2025   DTaP/Tdap/Td (4 - Td or Tdap) 12/26/2031   Pneumococcal Vaccine: 50+ Years  Completed   Influenza Vaccine  Completed   Hepatitis C Screening  Completed   HIV Screening  Completed   Hepatitis B Vaccines 19-59 Average Risk  Aged Out   HPV VACCINES  Aged Out   Meningococcal B Vaccine  Aged Out   COLON CANCER SCREENING ANNUAL FOBT  Discontinued    Health  Maintenance Items Addressed: Referral sent to GI for colonoscopy  Additional Screening:  Vision Screening: Recommended annual ophthalmology exams for early detection of glaucoma and other disorders of the eye. Is the patient up to date with their annual eye exam?  Yes  Who is the provider or what is the name of the office in which the patient attends annual eye exams? [facility] in [facility]  Dental Screening: Recommended annual dental exams for proper oral hygiene  Community Resource Referral / Chronic Care Management: CRR required this visit?  No   CCM required this visit?  No   Plan:    I have personally reviewed and noted the following in the patient's chart:   Medical and social history Use of alcohol, tobacco or illicit drugs  Current medications and supplements including opioid prescriptions. Patient is not currently taking opioid prescriptions. Functional ability and status Nutritional status Physical activity Advanced directives List of other physicians Hospitalizations, surgeries, and ER visits in previous 12 months Vitals Screenings to include cognitive, depression, and falls Referrals and appointments  In addition, I have reviewed and discussed with patient certain preventive protocols, quality metrics, and best practice recommendations. A written personalized care plan for preventive services as well as general preventive health recommendations were provided to patient.   Ozie Ned, CMA   05/10/2024   After Visit Summary: (MyChart) Due to this being a telephonic visit, the after visit summary with patients personalized plan was offered to patient via MyChart   Notes: PCP Follow Up Recommendations: Per pt stated that his diabetic eye exam was done at [facility].

## 2024-05-10 NOTE — Patient Instructions (Signed)
 [name],  Thank you for taking the time for your Medicare Wellness Visit. I appreciate your continued commitment to your health goals. Please review the care plan we discussed, and feel free to reach out if I can assist you further.  Medicare recommends these wellness visits once per year to help you and your care team stay ahead of potential health issues. These visits are designed to focus on prevention, allowing your provider to concentrate on managing your acute and chronic conditions during your regular appointments.  Please note that Annual Wellness Visits do not include a physical exam. Some assessments may be limited, especially if the visit was conducted virtually. If needed, we may recommend a separate in-person follow-up with your provider.  Ongoing Care Seeing your primary care provider every 3 to 6 months helps us  monitor your health and provide consistent, personalized care.   Referrals If a referral was made during today's visit and you haven't received any updates within two weeks, please contact the referred provider directly to check on the status.  Recommended Screenings:  Health Maintenance  Topic Date Due   Colon Cancer Screening  Never done   Yearly kidney health urinalysis for diabetes  09/30/2023   Medicare Annual Wellness Visit  03/06/2024   Eye exam for diabetics  04/06/2024   COVID-19 Vaccine (1) 04/25/2025*   Zoster (Shingles) Vaccine (1 of 2) 07/09/2025*   Hemoglobin A1C  10/07/2024   Yearly kidney function blood test for diabetes  01/05/2025   Complete foot exam   01/05/2025   DTaP/Tdap/Td vaccine (4 - Td or Tdap) 12/26/2031   Pneumococcal Vaccine for age over 61  Completed   Flu Shot  Completed   Hepatitis C Screening  Completed   HIV Screening  Completed   Hepatitis B Vaccine  Aged Out   HPV Vaccine  Aged Out   Meningitis B Vaccine  Aged Out   Stool Blood Test  Discontinued  *Topic was postponed. The date shown is not the original due date.        05/10/2024    1:15 PM  Advanced Directives  Does Patient Have a Medical Advance Directive? No   Advance Care Planning is important because it: Ensures you receive medical care that aligns with your values, goals, and preferences. Provides guidance to your family and loved ones, reducing the emotional burden of decision-making during critical moments.  Vision: Annual vision screenings are recommended for early detection of glaucoma, cataracts, and diabetic retinopathy. These exams can also reveal signs of chronic conditions such as diabetes and high blood pressure.  Dental: Annual dental screenings help detect early signs of oral cancer, gum disease, and other conditions linked to overall health, including heart disease and diabetes.  Please see the attached documents for additional preventive care recommendations.

## 2024-05-18 ENCOUNTER — Encounter: Payer: Self-pay | Admitting: Family Medicine

## 2024-05-19 ENCOUNTER — Other Ambulatory Visit: Payer: Self-pay | Admitting: *Deleted

## 2024-05-19 DIAGNOSIS — I152 Hypertension secondary to endocrine disorders: Secondary | ICD-10-CM

## 2024-05-19 DIAGNOSIS — E1159 Type 2 diabetes mellitus with other circulatory complications: Secondary | ICD-10-CM

## 2024-05-19 DIAGNOSIS — E782 Mixed hyperlipidemia: Secondary | ICD-10-CM

## 2024-05-19 DIAGNOSIS — E785 Hyperlipidemia, unspecified: Secondary | ICD-10-CM

## 2024-05-19 DIAGNOSIS — E1169 Type 2 diabetes mellitus with other specified complication: Secondary | ICD-10-CM

## 2024-05-19 DIAGNOSIS — K219 Gastro-esophageal reflux disease without esophagitis: Secondary | ICD-10-CM

## 2024-06-05 ENCOUNTER — Encounter: Payer: Self-pay | Admitting: Family Medicine

## 2024-06-05 ENCOUNTER — Telehealth: Payer: Self-pay | Admitting: Family Medicine

## 2024-06-05 NOTE — Telephone Encounter (Signed)
 Referral sent to: [facility] [address], [place] [address] [phone]  MyChart Message sent to Patient with Specialty Office contact information.

## 2024-06-05 NOTE — Telephone Encounter (Signed)
 For PCP to advise on.   I will send referrals team a message to get an update on status of referral for colonoscopy.

## 2024-06-05 NOTE — Telephone Encounter (Signed)
 Please call patient with update on Colonoscopy referral that was placed on 05/10/2024.

## 2024-06-05 NOTE — Telephone Encounter (Signed)
 I called and spoke with patient and gave him referral info.

## 2024-07-16 ENCOUNTER — Ambulatory Visit (INDEPENDENT_AMBULATORY_CARE_PROVIDER_SITE_OTHER): Payer: Self-pay | Admitting: Family Medicine

## 2024-07-16 ENCOUNTER — Encounter: Payer: Self-pay | Admitting: Family Medicine

## 2024-07-16 VITALS — BP 112/74 | HR 84 | Ht 74.0 in | Wt 244.0 lb

## 2024-07-16 DIAGNOSIS — Z794 Long term (current) use of insulin: Secondary | ICD-10-CM

## 2024-07-16 DIAGNOSIS — E1159 Type 2 diabetes mellitus with other circulatory complications: Secondary | ICD-10-CM

## 2024-07-16 DIAGNOSIS — E785 Hyperlipidemia, unspecified: Secondary | ICD-10-CM | POA: Diagnosis not present

## 2024-07-16 DIAGNOSIS — I152 Hypertension secondary to endocrine disorders: Secondary | ICD-10-CM

## 2024-07-16 DIAGNOSIS — E039 Hypothyroidism, unspecified: Secondary | ICD-10-CM | POA: Diagnosis not present

## 2024-07-16 DIAGNOSIS — Z79899 Other long term (current) drug therapy: Secondary | ICD-10-CM | POA: Diagnosis not present

## 2024-07-16 DIAGNOSIS — N1831 Chronic kidney disease, stage 3a: Secondary | ICD-10-CM

## 2024-07-16 DIAGNOSIS — E0822 Diabetes mellitus due to underlying condition with diabetic chronic kidney disease: Secondary | ICD-10-CM

## 2024-07-16 DIAGNOSIS — J439 Emphysema, unspecified: Secondary | ICD-10-CM | POA: Diagnosis not present

## 2024-07-16 DIAGNOSIS — E1169 Type 2 diabetes mellitus with other specified complication: Secondary | ICD-10-CM | POA: Diagnosis not present

## 2024-07-16 DIAGNOSIS — M069 Rheumatoid arthritis, unspecified: Secondary | ICD-10-CM | POA: Diagnosis not present

## 2024-07-16 LAB — BAYER DCA HB A1C WAIVED: HB A1C (BAYER DCA - WAIVED): 7.5 % — ABNORMAL HIGH (ref 4.8–5.6)

## 2024-07-16 NOTE — Progress Notes (Signed)
 "  BP 112/74   Pulse 84   Ht 6' 2 (1.88 m)   Wt 244 lb (110.7 kg)   SpO2 94%   BMI 31.33 kg/m    Subjective:   Patient ID: Joseph [name], male    DOB: [birth year], 63 y.o.   MRN: [id number]  HPI: Joseph [name] is a 63 y.o. male presenting on 07/16/2024 for Medical Management of Chronic Issues, Diabetes, Hyperlipidemia, and Hypothyroidism   Discussed the use of AI scribe software for clinical note transcription with the patient, who gave verbal consent to proceed.  History of Present Illness   Joseph [name] is a 63 year old male with emphysema and diabetes who presents for a recheck.  Respiratory symptoms and emphysema management - Breathing and lung function stable during the winter - No significant exacerbations of emphysema - Continues to use inhalers, including albuterol  as a rescue inhaler and Spiriva  regularly - Requests refill of albuterol  inhaler  Glycemic control and diabetes management - Blood glucose levels stable over the past 14 to 30 days with occasional postprandial spikes around lunchtime - Hemoglobin A1c improved compared to previous levels - Uses Libre sensor for glucose monitoring; experienced one episode where blood entered the sensor causing it to detach - Continues to take diabetes medications as prescribed  Cholesterol and thyroid  medication adherence - Takes medications for cholesterol and thyroid  management regularly without issues  Cutaneous lesion surveillance - Monitoring a rough spot on the skin that does not currently appear malignant - History of previous skin lesions treated with cryotherapy  Cardiac evaluation - Recalls being advised to schedule an EKG, but is unsure of the specific indication          Relevant past medical, surgical, family and social history reviewed and updated as indicated. Interim medical history since our last visit reviewed. Allergies and medications reviewed and updated.  Review of Systems   Constitutional:  Negative for chills and fever.  Eyes:  Negative for visual disturbance.  Respiratory:  Negative for shortness of breath and wheezing.   Cardiovascular:  Negative for chest pain and leg swelling.  Musculoskeletal:  Negative for back pain and gait problem.  Skin:  Negative for rash.  Neurological:  Negative for dizziness and light-headedness.  All other systems reviewed and are negative.   Per HPI unless specifically indicated above   Allergies as of 07/16/2024       Reactions   Lisinopril  Other (See Comments)   Dizziness    Metformin  And Related Diarrhea   GI intolerance   Ozempic  (0.25 Or 0.5 Mg-dose) [semaglutide (0.25 Or 0.5mg -dos)] Other (See Comments)   Weakness; GI intolerance        Medication List        Accurate as of July 16, 2024  3:37 PM. If you have any questions, ask your nurse or doctor.          Accu-Chek Aviva Plus test strip Generic drug: glucose blood Test BS up to 3 times daily Dx E11.22   Accu-Chek Aviva Plus w/Device Kit 1 each by Does not apply route 3 (three) times daily.   albuterol  108 (90 Base) MCG/ACT inhaler Commonly known as: VENTOLIN  HFA INHALE 1-2 PUFFS BY MOUTH EVERY 6 HOURS AS NEEDED FOR WHEEZE OR SHORTNESS OF BREATH   Amitiza  24 MCG capsule Generic drug: lubiprostone  Take 24 mcg by mouth 2 (two) times daily.   ARIPiprazole  2 MG tablet Commonly known as: ABILIFY  Take 1 tablet (2 mg total) by mouth at  bedtime.   dapagliflozin  propanediol 10 MG Tabs tablet Commonly known as: Farxiga  Take 1 tablet (10 mg total) by mouth daily before breakfast.   fenofibrate  160 MG tablet TAKE 1 TABLET BY MOUTH EVERY DAY   finasteride  5 MG tablet Commonly known as: PROSCAR  Take 1 tablet (5 mg total) by mouth daily.   FreeStyle Libre 3 Plus Sensor Misc Change sensor every 15 days.   FreeStyle Libre 3 Reader Devi 1 each by Does not apply route 4 (four) times daily.   hydrOXYzine  25 MG capsule Commonly known as:  VISTARIL  Take 1-2 capsules (25-50 mg total) by mouth at bedtime as needed for anxiety (insomnia).   levothyroxine  150 MCG tablet Commonly known as: SYNTHROID  Take 1 tablet (150 mcg total) by mouth daily before breakfast.   mirtazapine  45 MG tablet Commonly known as: REMERON  Take 1 tablet (45 mg total) by mouth at bedtime.   morphine  30 MG 12 hr tablet Commonly known as: MS CONTIN  Take 30 mg by mouth every 8 (eight) hours.   oxyCODONE -acetaminophen  7.5-325 MG tablet Commonly known as: PERCOCET TAKE 1 TABLET BY MOUTH EVERY FOUR HOURS AS NEEDED FOR PAIN   pantoprazole  20 MG tablet Commonly known as: PROTONIX  TAKE 1 TABLET BY MOUTH EVERY DAY   pregabalin 75 MG capsule Commonly known as: LYRICA Take 75 mg by mouth 3 (three) times daily.   rizatriptan  10 MG tablet Commonly known as: MAXALT  TAKE 1 TABLET BY MOUTH AS NEEDED FOR MIGRAINE. MAY REPEAT IN 2 HOURS IF NEEDED   rosuvastatin  10 MG tablet Commonly known as: CRESTOR  Take 1 tablet (10 mg total) by mouth daily.   sertraline  100 MG tablet Commonly known as: ZOLOFT  Take 1 tablet (100 mg total) by mouth daily.   Spiriva  Respimat 2.5 MCG/ACT Aers Generic drug: Tiotropium Bromide  Inhale 2 puffs into the lungs daily.   tamsulosin  0.4 MG Caps capsule Commonly known as: FLOMAX  Take 1 capsule (0.4 mg total) by mouth daily.   Toujeo  SoloStar 300 UNIT/ML Solostar Pen Generic drug: insulin  glargine (1 Unit Dial) Inject 12-14 Units into the skin daily at 12 noon.   trazodone  300 MG tablet Commonly known as: DESYREL  TAKE 1 TABLET BY MOUTH EVERYDAY AT BEDTIME   Vitamin D3 125 MCG (5000 UT) Caps Take 5,000 Units by mouth.         Objective:   BP 112/74   Pulse 84   Ht 6' 2 (1.88 m)   Wt 244 lb (110.7 kg)   SpO2 94%   BMI 31.33 kg/m   Wt Readings from Last 3 Encounters:  07/16/24 244 lb (110.7 kg)  05/10/24 245 lb (111.1 kg)  04/09/24 245 lb 6.4 oz (111.3 kg)    Physical Exam Physical Exam   VITALS: BP-  112/74 CHEST: Lungs clear to auscultation bilaterally. CARDIOVASCULAR: Heart regular rate and rhythm, no murmurs. EXTREMITIES: No edema, good pulses in extremities. SKIN: Rough spot on skin, not cancerous.       Results for orders placed or performed in visit on 04/09/24  Bayer DCA Hb A1c Waived   Collection Time: 04/09/24  2:48 PM  Result Value Ref Range   HB A1C (BAYER DCA - WAIVED) 8.0 (H) 4.8 - 5.6 %    Assessment & Plan:   Problem List Items Addressed This Visit       Cardiovascular and Mediastinum   Hypertension associated with diabetes (HCC)   Relevant Orders   Bayer DCA Hb A1c Waived   CBC with Differential/Platelet   CMP14+EGFR  Lipid panel     Respiratory   Pulmonary emphysema (HCC)     Endocrine   Hypothyroidism   Relevant Orders   TSH   Diabetes mellitus with chronic kidney disease (HCC)   Relevant Orders   Microalbumin / creatinine urine ratio   Hyperlipidemia associated with type 2 diabetes mellitus (HCC) - Primary   Relevant Orders   Bayer DCA Hb A1c Waived   CBC with Differential/Platelet   CMP14+EGFR   Lipid panel     Musculoskeletal and Integument   Rheumatoid arthritis(714.0)   Other Visit Diagnoses       On psychotropic medication       Relevant Orders   EKG 12-Lead          Type 2 diabetes mellitus A1c improved to 7.5, indicating better glycemic control. - Continue current diabetes medications. - Focus on dietary management.  Pulmonary emphysema Well-managed with stable breathing and lung function. - Refilled albuterol  rescue inhaler.  Acquired hypothyroidism Well-managed with current thyroid  medication. - Continue current thyroid  medication.  Skin lesion with malignant potential Small lesion with potential for malignancy, requires monitoring. - Monitor skin lesion for changes in size or appearance. - Consider cryotherapy if lesion enlarges.  General Health Maintenance Routine health maintenance discussed. - Ordered  EKG.     Patient's rheumatoid arthritis is stable and managed by specialist  EKG: Normal sinus rhythm, normal QT  Follow up plan: Return in about 3 months (around 10/14/2024), or if symptoms worsen or fail to improve, for Diabetes recheck.  Counseling provided for all of the vaccine components Orders Placed This Encounter  Procedures   Bayer DCA Hb A1c Waived   CBC with Differential/Platelet   CMP14+EGFR   Lipid panel   TSH   Microalbumin / creatinine urine ratio   EKG 12-Lead    Fonda Levins, MD [facility] 07/16/2024, 3:37 PM     "

## 2024-07-17 ENCOUNTER — Telehealth: Payer: Self-pay

## 2024-07-17 NOTE — Telephone Encounter (Signed)
 During OV on 112/29 pt stated that his psych provider is requesting an EKG. EKG performed and faxed along with last OV note and labs. Fax # [phone]- [facility].

## 2024-07-18 LAB — CMP14+EGFR
ALT: 30 IU/L (ref 0–44)
AST: 29 IU/L (ref 0–40)
Albumin: 4.2 g/dL (ref 3.9–4.9)
Alkaline Phosphatase: 24 IU/L — ABNORMAL LOW (ref 47–123)
BUN/Creatinine Ratio: 13 (ref 10–24)
BUN: 15 mg/dL (ref 8–27)
Bilirubin Total: 0.3 mg/dL (ref 0.0–1.2)
CO2: 23 mmol/L (ref 20–29)
Calcium: 9.2 mg/dL (ref 8.6–10.2)
Chloride: 104 mmol/L (ref 96–106)
Creatinine, Ser: 1.18 mg/dL (ref 0.76–1.27)
Globulin, Total: 2 g/dL (ref 1.5–4.5)
Glucose: 164 mg/dL — ABNORMAL HIGH (ref 70–99)
Potassium: 4.7 mmol/L (ref 3.5–5.2)
Sodium: 143 mmol/L (ref 134–144)
Total Protein: 6.2 g/dL (ref 6.0–8.5)
eGFR: 69 mL/min/1.73

## 2024-07-18 LAB — CBC WITH DIFFERENTIAL/PLATELET
Basophils Absolute: 0.1 x10E3/uL (ref 0.0–0.2)
Basos: 1 %
EOS (ABSOLUTE): 0.1 x10E3/uL (ref 0.0–0.4)
Eos: 3 %
Hematocrit: 43.9 % (ref 37.5–51.0)
Hemoglobin: 14 g/dL (ref 13.0–17.7)
Immature Grans (Abs): 0 x10E3/uL (ref 0.0–0.1)
Immature Granulocytes: 0 %
Lymphocytes Absolute: 1.6 x10E3/uL (ref 0.7–3.1)
Lymphs: 34 %
MCH: 27.7 pg (ref 26.6–33.0)
MCHC: 31.9 g/dL (ref 31.5–35.7)
MCV: 87 fL (ref 79–97)
Monocytes Absolute: 0.3 x10E3/uL (ref 0.1–0.9)
Monocytes: 7 %
Neutrophils Absolute: 2.7 x10E3/uL (ref 1.4–7.0)
Neutrophils: 55 %
Platelets: 180 x10E3/uL (ref 150–450)
RBC: 5.06 x10E6/uL (ref 4.14–5.80)
RDW: 13.1 % (ref 11.6–15.4)
WBC: 4.8 x10E3/uL (ref 3.4–10.8)

## 2024-07-18 LAB — LIPID PANEL
Chol/HDL Ratio: 3.6 ratio (ref 0.0–5.0)
Cholesterol, Total: 128 mg/dL (ref 100–199)
HDL: 36 mg/dL — ABNORMAL LOW
LDL Chol Calc (NIH): 54 mg/dL (ref 0–99)
Triglycerides: 239 mg/dL — ABNORMAL HIGH (ref 0–149)
VLDL Cholesterol Cal: 38 mg/dL (ref 5–40)

## 2024-07-18 LAB — TSH: TSH: 0.903 u[IU]/mL (ref 0.450–4.500)

## 2024-07-25 ENCOUNTER — Ambulatory Visit: Payer: Self-pay | Admitting: Family Medicine

## 2024-08-06 ENCOUNTER — Other Ambulatory Visit: Payer: Self-pay | Admitting: Family Medicine

## 2024-10-18 ENCOUNTER — Ambulatory Visit: Admitting: Family Medicine

## 2025-05-14 ENCOUNTER — Ambulatory Visit: Payer: Self-pay
